# Patient Record
Sex: Male | Born: 1935 | Race: White | Hispanic: No | Marital: Married | Smoking: Former smoker
Health system: Southern US, Community
[De-identification: ages and names within clinical notes are randomized; demographics above are authoritative.]

## PROBLEM LIST (undated history)

## (undated) DIAGNOSIS — R300 Dysuria: Secondary | ICD-10-CM

## (undated) DIAGNOSIS — I10 Essential (primary) hypertension: Secondary | ICD-10-CM

## (undated) DIAGNOSIS — E669 Obesity, unspecified: Secondary | ICD-10-CM

## (undated) DIAGNOSIS — Z87898 Personal history of other specified conditions: Secondary | ICD-10-CM

## (undated) DIAGNOSIS — I779 Disorder of arteries and arterioles, unspecified: Secondary | ICD-10-CM

## (undated) DIAGNOSIS — G4730 Sleep apnea, unspecified: Secondary | ICD-10-CM

## (undated) DIAGNOSIS — I2510 Atherosclerotic heart disease of native coronary artery without angina pectoris: Secondary | ICD-10-CM

## (undated) DIAGNOSIS — I739 Peripheral vascular disease, unspecified: Secondary | ICD-10-CM

## (undated) DIAGNOSIS — I350 Nonrheumatic aortic (valve) stenosis: Secondary | ICD-10-CM

## (undated) DIAGNOSIS — K219 Gastro-esophageal reflux disease without esophagitis: Secondary | ICD-10-CM

## (undated) DIAGNOSIS — E785 Hyperlipidemia, unspecified: Secondary | ICD-10-CM

## (undated) DIAGNOSIS — C801 Malignant (primary) neoplasm, unspecified: Secondary | ICD-10-CM

## (undated) HISTORY — DX: Hyperlipidemia, unspecified: E78.5

## (undated) HISTORY — DX: Obesity, unspecified: E66.9

## (undated) HISTORY — PX: INSERT / REPLACE / REMOVE PACEMAKER: SUR710

## (undated) HISTORY — DX: Peripheral vascular disease, unspecified: I73.9

## (undated) HISTORY — DX: Dysuria: R30.0

## (undated) HISTORY — DX: Disorder of arteries and arterioles, unspecified: I77.9

## (undated) HISTORY — DX: Essential (primary) hypertension: I10

## (undated) HISTORY — DX: Nonrheumatic aortic (valve) stenosis: I35.0

## (undated) HISTORY — DX: Sleep apnea, unspecified: G47.30

---

## 1998-03-20 ENCOUNTER — Ambulatory Visit (HOSPITAL_COMMUNITY): Admission: RE | Admit: 1998-03-20 | Discharge: 1998-03-20 | Payer: Self-pay | Admitting: Internal Medicine

## 2001-08-30 ENCOUNTER — Ambulatory Visit (HOSPITAL_COMMUNITY): Admission: RE | Admit: 2001-08-30 | Discharge: 2001-08-30 | Payer: Self-pay | Admitting: Gastroenterology

## 2001-08-30 ENCOUNTER — Encounter (INDEPENDENT_AMBULATORY_CARE_PROVIDER_SITE_OTHER): Payer: Self-pay | Admitting: Specialist

## 2001-11-17 HISTORY — PX: CORONARY ARTERY BYPASS GRAFT: SHX141

## 2002-01-07 ENCOUNTER — Encounter: Admission: RE | Admit: 2002-01-07 | Discharge: 2002-01-07 | Payer: Self-pay | Admitting: Internal Medicine

## 2002-01-07 ENCOUNTER — Encounter: Payer: Self-pay | Admitting: Internal Medicine

## 2002-04-10 ENCOUNTER — Encounter: Payer: Self-pay | Admitting: Cardiology

## 2002-04-10 ENCOUNTER — Inpatient Hospital Stay (HOSPITAL_COMMUNITY): Admission: EM | Admit: 2002-04-10 | Discharge: 2002-04-11 | Payer: Self-pay

## 2002-04-17 HISTORY — PX: CHOLECYSTECTOMY: SHX55

## 2002-04-27 ENCOUNTER — Encounter: Payer: Self-pay | Admitting: Cardiology

## 2002-04-27 ENCOUNTER — Ambulatory Visit (HOSPITAL_COMMUNITY): Admission: RE | Admit: 2002-04-27 | Discharge: 2002-04-27 | Payer: Self-pay | Admitting: Cardiology

## 2002-05-04 ENCOUNTER — Encounter: Payer: Self-pay | Admitting: Cardiothoracic Surgery

## 2002-05-04 ENCOUNTER — Inpatient Hospital Stay (HOSPITAL_COMMUNITY): Admission: RE | Admit: 2002-05-04 | Discharge: 2002-05-09 | Payer: Self-pay | Admitting: Cardiothoracic Surgery

## 2002-05-05 ENCOUNTER — Encounter: Payer: Self-pay | Admitting: Cardiothoracic Surgery

## 2002-05-06 ENCOUNTER — Encounter: Payer: Self-pay | Admitting: Cardiothoracic Surgery

## 2002-05-07 ENCOUNTER — Encounter: Payer: Self-pay | Admitting: Cardiothoracic Surgery

## 2002-05-25 ENCOUNTER — Encounter (HOSPITAL_COMMUNITY): Admission: RE | Admit: 2002-05-25 | Discharge: 2002-08-23 | Payer: Self-pay | Admitting: Cardiology

## 2002-08-10 ENCOUNTER — Encounter: Payer: Self-pay | Admitting: Surgery

## 2002-08-15 ENCOUNTER — Encounter (INDEPENDENT_AMBULATORY_CARE_PROVIDER_SITE_OTHER): Payer: Self-pay | Admitting: Specialist

## 2002-08-15 ENCOUNTER — Ambulatory Visit (HOSPITAL_COMMUNITY): Admission: RE | Admit: 2002-08-15 | Discharge: 2002-08-16 | Payer: Self-pay | Admitting: Surgery

## 2002-08-15 ENCOUNTER — Encounter: Payer: Self-pay | Admitting: Surgery

## 2002-08-24 ENCOUNTER — Encounter (HOSPITAL_COMMUNITY): Admission: RE | Admit: 2002-08-24 | Discharge: 2002-08-31 | Payer: Self-pay | Admitting: Cardiology

## 2002-09-19 ENCOUNTER — Encounter (HOSPITAL_COMMUNITY): Admission: RE | Admit: 2002-09-19 | Discharge: 2002-12-18 | Payer: Self-pay | Admitting: Cardiology

## 2005-05-22 ENCOUNTER — Ambulatory Visit: Payer: Self-pay | Admitting: Internal Medicine

## 2008-02-16 HISTORY — PX: CAROTID ENDARTERECTOMY: SUR193

## 2008-03-03 ENCOUNTER — Ambulatory Visit: Payer: Self-pay | Admitting: Vascular Surgery

## 2008-03-14 ENCOUNTER — Ambulatory Visit: Payer: Self-pay | Admitting: Vascular Surgery

## 2008-03-14 ENCOUNTER — Inpatient Hospital Stay (HOSPITAL_COMMUNITY): Admission: RE | Admit: 2008-03-14 | Discharge: 2008-03-15 | Payer: Self-pay | Admitting: Vascular Surgery

## 2008-03-14 ENCOUNTER — Encounter: Payer: Self-pay | Admitting: Vascular Surgery

## 2008-03-31 ENCOUNTER — Ambulatory Visit: Payer: Self-pay | Admitting: Vascular Surgery

## 2008-09-29 ENCOUNTER — Ambulatory Visit: Payer: Self-pay | Admitting: Vascular Surgery

## 2009-04-06 ENCOUNTER — Ambulatory Visit: Payer: Self-pay | Admitting: Vascular Surgery

## 2009-04-16 ENCOUNTER — Emergency Department (HOSPITAL_COMMUNITY): Admission: EM | Admit: 2009-04-16 | Discharge: 2009-04-16 | Payer: Self-pay | Admitting: Family Medicine

## 2009-04-17 ENCOUNTER — Encounter: Admission: RE | Admit: 2009-04-17 | Discharge: 2009-04-17 | Payer: Self-pay | Admitting: Otolaryngology

## 2009-04-21 ENCOUNTER — Emergency Department (HOSPITAL_COMMUNITY): Admission: EM | Admit: 2009-04-21 | Discharge: 2009-04-21 | Payer: Self-pay | Admitting: Emergency Medicine

## 2009-06-15 ENCOUNTER — Encounter: Admission: RE | Admit: 2009-06-15 | Discharge: 2009-06-15 | Payer: Self-pay | Admitting: Urology

## 2009-06-19 ENCOUNTER — Ambulatory Visit (HOSPITAL_BASED_OUTPATIENT_CLINIC_OR_DEPARTMENT_OTHER): Admission: RE | Admit: 2009-06-19 | Discharge: 2009-06-19 | Payer: Self-pay | Admitting: Urology

## 2009-08-17 HISTORY — PX: US ECHOCARDIOGRAPHY: HXRAD669

## 2010-04-05 ENCOUNTER — Ambulatory Visit: Payer: Self-pay | Admitting: Vascular Surgery

## 2010-04-30 ENCOUNTER — Ambulatory Visit
Admission: RE | Admit: 2010-04-30 | Discharge: 2010-07-29 | Payer: Self-pay | Source: Home / Self Care | Admitting: Radiation Oncology

## 2010-08-01 ENCOUNTER — Ambulatory Visit
Admission: RE | Admit: 2010-08-01 | Discharge: 2010-08-07 | Payer: Self-pay | Source: Home / Self Care | Admitting: Radiation Oncology

## 2010-09-11 ENCOUNTER — Ambulatory Visit: Payer: Self-pay | Admitting: Cardiology

## 2011-02-22 LAB — POCT I-STAT 4, (NA,K, GLUC, HGB,HCT)
HCT: 45 % (ref 39.0–52.0)
Potassium: 3.9 mEq/L (ref 3.5–5.1)
Sodium: 144 mEq/L (ref 135–145)

## 2011-02-24 LAB — URINE CULTURE

## 2011-02-24 LAB — URINALYSIS, ROUTINE W REFLEX MICROSCOPIC
Bilirubin Urine: NEGATIVE
Ketones, ur: NEGATIVE mg/dL
Specific Gravity, Urine: 1.014 (ref 1.005–1.030)
Urobilinogen, UA: 0.2 mg/dL (ref 0.0–1.0)
pH: 6 (ref 5.0–8.0)

## 2011-02-24 LAB — URINE MICROSCOPIC-ADD ON

## 2011-03-13 ENCOUNTER — Ambulatory Visit: Payer: Self-pay | Admitting: Cardiology

## 2011-03-19 ENCOUNTER — Other Ambulatory Visit: Payer: Self-pay

## 2011-03-20 ENCOUNTER — Other Ambulatory Visit (INDEPENDENT_AMBULATORY_CARE_PROVIDER_SITE_OTHER): Payer: Medicare Other

## 2011-03-20 ENCOUNTER — Ambulatory Visit (INDEPENDENT_AMBULATORY_CARE_PROVIDER_SITE_OTHER): Payer: Medicare Other

## 2011-03-20 DIAGNOSIS — I6529 Occlusion and stenosis of unspecified carotid artery: Secondary | ICD-10-CM

## 2011-03-20 DIAGNOSIS — Z48812 Encounter for surgical aftercare following surgery on the circulatory system: Secondary | ICD-10-CM

## 2011-03-20 NOTE — H&P (Signed)
HISTORY AND PHYSICAL EXAMINATION  Mar 20, 2011  Re:  Andre Miranda, TRETO                  DOB:  1936-06-27  CHIEF COMPLAINT:  Carotid artery disease.  HISTORY OF PRESENT ILLNESS:  This is a 75 year old white male who is a patient of Dr. Bosie Helper who had carotid endarterectomy in April of 2009. He did quite well with his surgery and returns today for follow up carotid duplex scan.  The patient has had no signs or symptoms of stroke.  He denies any hemiparalysis, hemiparesis, amaurosis, or vision changes.  He denies any facial droop or slurred speech.  Recently, back in April of 2011, he was diagnosed with prostate cancer and underwent 8 weeks of radiation, and since this time, he has been having burning with urination as well as frequency.  He has been tested for urinary tract infections and these have been negative.  He has also had a history of urethral dilatation.  He also has a history of CABG in June of 2003 and denies chest pain and only gets shortness of breath with severe exertion.  He denies any history of irregular heartbeat or atrial fibrillation.  He does have heart murmurs and is being followed by Cardiology.  He denies any peptic ulcer disease, melena, or acid reflux. He does use CPAP at night and does this on a regular basis.  He also has a history of hypertension.  Otherwise, his review of systems are negative.  PAST MEDICAL/SURGICAL HISTORY: 1. Extracranial cerebrovascular occlusive disease.  Status post     carotid endarterectomy in April 2009. 2. History of prostate cancer in April 2011.  Eight weeks of     radiation. 3. History of coronary artery disease.  Status post CABG June 2003. 4. History of cholecystectomy in September 2003. 5. History of urethral dilatation. 6. Hypertension. 7. Remote history of tobacco use.  CURRENT MEDICATIONS: 1. Metoprolol 50 mg p.o. b.i.d. 2. Ramipril 10 mg p.o. daily. 3. Norvasc 2.5 mg p.o. daily. 4.  Hydrochlorothiazide 25 mg p.o. daily. 5. Allopurinol 300 mg p.o. daily. 6. Pepcid 20 mg p.o. daily. 7. Multivitamin p.o. daily. 8. D3 vitamins p.o. daily. 9. Aspirin 325 mg 1 p.o. daily. 10.Crestor 20 mg 1/2 tablet p.o. nightly. 11.Fish oil 1000 mg 2 tablets t.i.d. 12.Flomax 0.4 mg p.o. daily. 13.phenazopyridine 200 mg t.i.d.  SOCIAL HISTORY:  He is married.  He has remote tobacco use quitting in 1970 and he does drink 2 to 3 glasses of wine per week.  ALLERGIES:  No known drug allergies.  REVIEW OF SYSTEMS:  The review of systems were reviewed and are negative other than what is in the HPI, or was paused there because I keep trying to do it.  PHYSICAL EXAMINATION:  Vital Signs:  Heart rate is 60, blood pressure is 148/81 with mean of 96, oxygen saturation is 95% on room air.  General: He is in no acute distress.  HEENT:  Pupils equal, round, and reactive to light.  Conjunctivae normal.  Lungs:  Clear to auscultation bilaterally.  No rhonchi, rales, or wheezing.  Cardiovascular:  No carotid bruits are heard.  Heart is regular rate and rhythm with a positive murmur.  He does have 2+ radial pulses as well as 2+ dorsalis pedis pulses.  Abdomen:  Soft, nontender, nondistended.  Positive bowel sounds.  Neuro:  There are no focal defects.  Skin:  Without ulcers or rashes.  He does have a well-healed  CABG scar as well as well-healed vein harvest scars.  ASSESSMENT:  This is a 75 year old white male who has a history of extracranial cerebrovascular occlusive disease with a history of carotid endarterectomy in April of 2009 as well as hypertension, prostate cancer with 8 weeks of radiation, history of coronary artery disease.  PLAN: 1. The patient is to return in 1 year to have a carotid duplex scan. 2. He does have an appointment in a couple weeks with Dr. Deborah Chalk and     he will be following up with Dr. Waynard Edwards as well as Dr. Vonita Moss for     his other medical conditions.  His  carotid duplex scan today did reveal patent right carotid endarterectomy site with no evidence of restenosis.  There is no hemodynamically significant increase in Doppler velocities of the left internal carotid artery noted.  These are stable from his previous study.  Newton Pigg, PA  Charles E. Fields, MD Electronically Signed  SE/MEDQ  D:  03/20/2011  T:  03/20/2011  Job:  [id number]

## 2011-03-27 NOTE — Procedures (Unsigned)
CAROTID DUPLEX EXAM  INDICATION:  Follow up carotid artery disease.  HISTORY: Diabetes:  No. Cardiac:  CABG. Hypertension:  Yes. Smoking:  Previous. Previous Surgery:  Right carotid endarterectomy by Dr. Arbie Cookey, April 2009. CV History:  Currently asymptomatic. Amaurosis Fugax No, Paresthesias No, Hemiparesis No.                                      RIGHT             LEFT Brachial systolic pressure:         138               140 Brachial Doppler waveforms:         Normal            Normal Vertebral direction of flow:        Antegrade         Antegrade DUPLEX VELOCITIES (cm/sec) CCA peak systolic                   61                75 ECA peak systolic                   159               131 ICA peak systolic                   93                64 ICA end diastolic                   22                22 PLAQUE MORPHOLOGY:                                    Heterogenous PLAQUE AMOUNT:                      None              Mild PLAQUE LOCATION:                                      ICA  IMPRESSION: 1. Patent right carotid endarterectomy site with no evidence of     restenosis of the internal carotid artery. 2. No hemodynamically significant increase in Doppler velocities of     the left internal carotid artery noted. 3. Stable from previous study.  ___________________________________________ Larina Earthly, M.D.  EM/MEDQ  D:  03/20/2011  T:  03/21/2011  Job:  [id number]

## 2011-04-01 NOTE — Discharge Summary (Signed)
NAME:  Andre Miranda, RIGGI NO.:  [id number]   MEDICAL RECORD NO.:  [id number]          PATIENT TYPE:  INP   LOCATION:  3313                         FACILITY:  [facility]   PHYSICIAN:  Larina Earthly, M.D.    DATE OF BIRTH:  03-Jul-1936   DATE OF ADMISSION:  03/14/2008  DATE OF DISCHARGE:  03/15/2008                               DISCHARGE SUMMARY   The patient of Dr. Arbie Cookey.   DISCHARGE DIAGNOSES:  1. Right carotid occlusive disease.  2. Hypertension.  3. Dyslipidemia.   PROCEDURE PERFORMED:  Right carotid endarterectomy, March 14, 2008, Dr.  Arbie Cookey.   COMPLICATIONS:  None.   DISCHARGE MEDICATIONS:  1. Metoprolol 50 mg p.o. b.i.d.  2. Ramipril 10 mg p.o. q.a.m.  3. Hydrochlorothiazide 12.5 mg p.o. q.a.m.  4. Crestor 5 mg p.o. q.p.m.  5. Allopurinol 300 mg p.o.  6. Pepcid 20 mg p.o. q.a.m.  7. Ecotrin 325 mg p.o. q.a.m.  8. Centrum Silver one p.o. daily.  9. Fish oil 100 mg two p.o. t.i.d.  10.Vitamin D-3 p.o. q. noon.  11.Colchicine 0.6 mg as needed.  12.Prednisone 20 mg as needed.  13.Percocet 5/325 one p.o. q.4 h. p.r.n. pain total #20 given.   DISPOSITION:  He is being discharged home in stable condition with his  wounds healing well.  He was given careful instructions regarding the  care of his wound.  He is to clean the wound with soap and warm water.  He is to observe the wound for drainage, increasing pain, swelling,  redness, fever greater than 101.2, or neurological changes.  He is to  see Dr. Arbie Cookey in 2 weeks.  The office will call for visit to schedule  the appointment.   BRIEF IDENTIFYING STATEMENT:  For complete details, please refer to  typed history and physical.  Briefly, this very pleasant 75 year old  gentleman was referred to Dr. Arbie Cookey for evaluation of severe  asymptomatic right internal carotid arterial stenosis.  Dr. Arbie Cookey found  him to be suitable candidate for carotid endarterectomy.  He was  informed of the risks and benefits of the  procedure, and after careful  consideration, elected to proceed with surgery.   HOSPITAL COURSE:  Preoperative workup was completed as an outpatient.  He was brought in through the same-day surgery and underwent the  aforementioned right carotid endarterectomy.  For complete details,  please refer the typed operative report.  The procedure was without  complications.  He was returned to the Postanesthesia Care Unit  extubated.  Following stabilization, he was transferred to a bed in a  surgical step-down unit.  He was observed  overnight.  Following morning, he was doing well from a surgical  standpoint; however, he was complaining of some chest discomfort.  Due  to his history of coronary artery disease, we elected to ask Dr.  Deborah Chalk, his cardiologist, to evaluate him.  With the concurrence of Dr.  Deborah Chalk, he will be discharged home today, March 15, 2008.      Wilmon Arms, PA      Larina Earthly, M.D.  Electronically Signed    KEL/MEDQ  D:  03/15/2008  T:  03/15/2008  Job:  [id number]   cc:   Loraine Leriche A. Perini, M.D.

## 2011-04-01 NOTE — Op Note (Signed)
NAME:  Andre Miranda, Andre Miranda NO.:  [id number]   MEDICAL RECORD NO.:  [id number]          PATIENT TYPE:  AMB   LOCATION:  [facility]                         FACILITY:  [facility]   PHYSICIAN:  Maretta Bees. Vonita Moss, M.D.DATE OF BIRTH:  15-Feb-1936   DATE OF PROCEDURE:  06/19/2009  DATE OF DISCHARGE:                               OPERATIVE REPORT   PREOPERATIVE DIAGNOSIS:  Hematuria and urethral stricture.   POSTOPERATIVE DIAGNOSIS:  Hematuria and urethral stricture.   PROCEDURE:  Cystoscopy, balloon dilation of urethral stricture and  bilateral retrograde pyelograms with interpretation.   SURGEON:  Dr. Larey Dresser.   ANESTHESIA:  General.   INDICATIONS:  This 75 year old gentleman had gross hematuria in June,  and a CT scan in the emergency room showed no obvious urinary tract  lesions including stones.  It was a noncontrasted CT scan.  I saw him in  the office, and I could not cystoscope the bladder because of a urethral  stricture.  He was brought to the OR today for further evaluation  including retrograde pyelograms to delineate the upper urinary tracts in  view of his noncontrasted CT scan.   PROCEDURE:  The patient was brought to the operating room and placed in  lithotomy position.  The external genitalia were prepped and draped in  usual fashion.  He was cystoscoped, and the anterior urethra was normal  except for a tight stricture in the bulbous urethra.  I then inserted a  sensor guidewire into the bladder, and over the sensor guidewire, I  performed a balloon dilation to 24-French at 12 atmospheres of pressure  for 3 minutes.  I then cystoscoped him and easily traversed this  strictured area.  The prostate had partial obstruction.  The bladder had  mild trabeculation but no stones, tumors or inflammatory lesions of any  sort.   Bilateral retrograde pyelograms were performed by using the cystoscope  to insert a cone-tipped ureteral catheter and performed  retrograde  pyelography.  The ureters had some mild J hooking.  There were a couple  bubbles in the  contrast, but I followed them and knew that they were not filling  defects.  The pyelocaliceal systems were nonobstructed and without  filling defects.  At this point, the bladder was emptied, scope removed,  and the patient taken to the recovery room in good condition.      Maretta Bees. Vonita Moss, M.D.  Electronically Signed     LJP/MEDQ  D:  06/19/2009  T:  06/19/2009  Job:  [id number]   cc:   Loraine Leriche A. Perini, M.D.  Fax: [phone]

## 2011-04-01 NOTE — Assessment & Plan Note (Signed)
OFFICE VISIT   Andre Miranda, Andre Miranda  DOB:  27-Nov-1935                                       09/29/2008  ZOXWR#:[id number]   The patient presents today for a 46-month follow up of his right carotid  endarterectomy for severe asymptomatic internal carotid artery stenosis.  He has done well since his surgery and he has had no neurologic deficits  and no postoperative complications.  He remains stable with no new  cardiac difficulties.  He does continue to have hypertensive issues,  which are controlled with medication.   PHYSICAL EXAM:  Well-developed, well-nourished white male appearing  stated age of 59.  Blood pressure is 156/79, pulse 61, respirations 18.  His right leg incision is well healed.  He does not have bruits on the  right or left neck.  His radial pulses are 2+ and he is grossly intact  neurologically.   He underwent repeat carotid duplex in our office today and this reveals  wide patency of his right endarterectomy with no stenosis, and no  significant stenosis in his left carotid system.  I discussed at length  with the patient.  He will continue his usual activity.  He will notify  us should he develop any neurologic deficits.  Otherwise, we will see  him again in our vascular lab protocol for ongoing followup.   Larina Earthly, M.D.  Electronically Signed   TFE/MEDQ  D:  09/29/2008  T:  10/02/2008  Job:  [id number]   cc:   Loraine Leriche A. Perini, M.D.  Colleen Can. Deborah Chalk, M.D.

## 2011-04-01 NOTE — Consult Note (Signed)
NEW PATIENT CONSULTATION   Andre Miranda, Andre Miranda  DOB:  [birth year]                                       03/03/2008  GNFAO#:[id number]   The patient presents today for evaluation of severe asymptomatic right  internal carotid artery stenosis.  He is a 75 year old gentleman who is  quite active with history of hypertension, elevated cholesterol, and  coronary artery disease.  He is found to have a history of a screening  study in the past showing some moderate carotid disease and we are  seeing him today for further evaluation of this.  He denies specifically  any prior transient ischemic attack, amaurosis fugax, stroke.  Did  undergo coronary bypass grafting in 2003 and also a cholecystectomy  2003.  He does require see back for pulmonary issues.  His family  history is negative for premature carotid disease.   SOCIAL HISTORY:  Is married, 2 children.  He is retired.  He does not  smoke and quit 40 years ago, and does have social alcohol consumption.   REVIEW OF SYSTEMS:  His weight is stable at 240 pounds, height 5 feet  11.  He does have a prior history of cardiac disease as stated.   ALLERGIES:  No known drug allergies.   MEDICATIONS:  Metoprolol 50 mg b.i.d., ramipril 10 mg daily,  hydrochlorothiazide 25 mg 1/2 tablet daily, Crestor 10 mg 1/2 tablet  daily, allopurinol 10 mg daily, Pepcid AC 20 mg daily, Ecotrin 325 mg  daily, Centrum Silver 1 daily, fish oil 1000 mg 3 gel tabs twice a day,  vitamin D, occasional medication as needed for gout, colchicine and  prednisone.   PHYSICAL EXAM:  Well-developed and moderately obese white male in no  acute distress.  He is grossly intact neurologically.  I do not  appreciate carotid bruits bilaterally.  He has 2+ radial and 2+  posterior tibial pulses bilaterally.  Heart is regular rate rhythm with  a soft murmur.  He does have clear breath sounds bilaterally.  Abdominal  exam reveals no obesity.  No masses, no  tenderness.   VASCULAR LABORATORY STUDY:  Did undergo noninvasive vascular laboratory  studies today, revealing severe greater than 80% stenosis in the right  carotid system and no significant left carotid stenosis.  He did have  antegrade vertebral flow bilaterally.  I discussed this at length with  the patient.  I explained that this does put him at increased risk for  stroke related to severe asymptomatic stenosis.  I have recommended that  he undergo endarterectomy for maximum stroke risk.  I explained the  procedure to include the risk of stroke with surgery.  He understands  and wishes to proceed.  He has a trip planned next month and I feel  that, as long as he remains asymptomatic, that it is acceptable risk to  wait until he returns from this.  He will notify us when he wishes to  proceed with elective right carotid endarterectomy.   Larina Earthly, M.D.  Electronically Signed   TFE/MEDQ  D:  03/03/2008  T:  03/06/2008  Job:  [id number]   cc:   Loraine Leriche A. Perini, M.D.  Colleen Can. Deborah Chalk, M.D.

## 2011-04-01 NOTE — Consult Note (Signed)
NAME:  Andre Miranda, Andre Miranda NO.:  [id number]   MEDICAL RECORD NO.:  [id number]          PATIENT TYPE:  INP   LOCATION:  3313                         FACILITY:  [facility]   PHYSICIAN:  Colleen Can. Deborah Chalk, M.D.DATE OF BIRTH:  [birth year]   DATE OF CONSULTATION:  03/15/2008  DATE OF DISCHARGE:  03/15/2008                                 CONSULTATION   Thank you much for asking [place] to see Andre Miranda.  He is a 75 year old  male with a history of coronary disease and prior coronary artery bypass  grafting in 2003.  He had chest pain following a right carotid  endarterectomy.  It was a pressure-like fullness, but seemingly it was  worse with a deep breath.  One time it was described as an 8/10 chest  pain.  It was treated with Tylox with partial relief.  Symptoms lasted a  total of about 8 hours.  We subsequently had done an EKG and cardiac  enzymes which have been negative.  He currently is better.   His symptoms when he had coronary artery bypass grafting were also  associated with symptomatic cholelithiasis at that time.  He had chest  pressure and belching and diaphoresis as his primary symptoms.   PAST MEDICAL HISTORY:  He had coronary artery bypass grafting x4 in 2003  with a LIMA to the LAD, LIMA to the right coronary artery, and a  saphenous vein graft to the intermediate and distal circumflex.  He has  had a history of hypertension, obesity, status post cholecystectomy,  known carotid disease, hypercholesterolemia, and remote tobacco use.  He  has had sleep apnea and gout.   ALLERGIES:  None.   CURRENT MEDICATIONS:  1. Allopurinol 300 mg a day.  2. Aspirin 325 mg a day.  3. Vitamin E.  4. Pepcid 20 mg a day.  5. Hydrochlorothiazide 12.5 mg day.  6. Lopressor 50 mg b.i.d.  7. Multivitamin.  8. Lovaza 1 gram t.i.d.  9. Altace 10 mg per day.  10.Crestor 5 mg at bedtime.   FAMILY HISTORY:  Father died at age 27 with coronary artery disease and  prior myocardial  infarction, one sister has had 2 stents, and mother  lived into her 79s.   SOCIAL HISTORY:  He is married, remote smoker, and rare alcohol use.   REVIEW OF SYSTEMS:  He has some belching at times.  Overall, it seems  like he is doing well.  He does exercise regularly but has dietary  indiscretion.  Lipids have been followed by Dr. Waynard Edwards.  His weight is  in the 235-240 range.   PHYSICAL EXAMINATION:  GENERAL:  He is well-developed and moderately  obese.  VITAL SIGNS:  Heart rate 58, blood pressure is 135/60.  SKIN:  Warm and dry.  Color is normal.  LUNGS:  Clear.  HEART:  Shows 2/6 systolic ejection murmur.  ABDOMEN:  Soft, nontender.  EXTREMITIES:  Without edema.   PERTINENT LABORATORY DATA:  Hematocrit is 38.  Chemistries are negative.  Potassium is 3.3.  Cardiac enzymes and troponins are all negative.   IMPRESSION:  1. Postoperative chest  pain, probably not coronary in nature.  2. Known coronary atherosclerosis with prior bypass surgery in 2003.  3. Status post right carotid endarterectomy with progressive carotid      disease over the last 3-5 years.  4. Hypertension.  5. Hypercholesterolemia.  6. Obesity.  7. Aortic outflow murmur.   PLAN:  We will arrange for him to be discharged.  He will need to have a  followup 2D echocardiogram because of the outflow murmur in the late  peaking features associated with it.  I think we need to address the  progression of his atherosclerotic disease in the carotid location and  treat tentatively as a systemic issue and try to have more aggressively  modified cardiovascular risk factors.      Colleen Can. Deborah Chalk, M.D.  Electronically Signed     SNT/MEDQ  D:  03/15/2008  T:  03/16/2008  Job:  [id number]   cc:   Loraine Leriche A. Perini, M.D.  Larina Earthly, M.D.

## 2011-04-01 NOTE — Procedures (Signed)
CAROTID DUPLEX EXAM   INDICATION:  Follow up right carotid endarterectomy.   HISTORY:  Diabetes:  No.  Cardiac:  CABG.  Hypertension:  Yes.  Smoking:  Previous.  Previous Surgery:  Right carotid endarterectomy by Dr. Arbie Cookey in April  2009.  CV History:  Currently asymptomatic.  Amaurosis Fugax No, Paresthesias No, Hemiparesis No.                                       RIGHT             LEFT  Brachial systolic pressure:         140               142  Brachial Doppler waveforms:         Normal            Normal  Vertebral direction of flow:        Antegrade         Antegrade  DUPLEX VELOCITIES (cm/sec)  CCA peak systolic                   77                59  ECA peak systolic                   144               122  ICA peak systolic                   88                68  ICA end diastolic                   27                21  PLAQUE MORPHOLOGY:                                    Heterogenous  PLAQUE AMOUNT:                      None              Mild  PLAQUE LOCATION:                                      ICA   IMPRESSION:  1. Patent right carotid endarterectomy site with no right internal      carotid artery stenosis.  2. No hemodynamically significant increase in the Doppler velocities      of the left internal carotid artery noted.  3. No significant change noted when compared to the previous      examination on 04/06/2009.   ___________________________________________  Larina Earthly, M.D.   CH/MEDQ  D:  04/05/2010  T:  04/05/2010  Job:  [id number]

## 2011-04-01 NOTE — Procedures (Signed)
CAROTID DUPLEX EXAM   INDICATION:  Followup right carotid endarterectomy.   HISTORY:  Diabetes:  No.  Cardiac:  CABG in 2003.  Hypertension:  Yes.  Smoking:  Quit about 30 years ago.  Previous Surgery:  No.  CV History:  No.  Amaurosis Fugax No, Paresthesias No, Hemiparesis No                                       RIGHT             LEFT  Brachial systolic pressure:         156               162  Brachial Doppler waveforms:         Biphasic          Biphasic  Vertebral direction of flow:        Antegrade         Antegrade  DUPLEX VELOCITIES (cm/sec)  CCA peak systolic                   95                86  ECA peak systolic                   192               130  ICA peak systolic                   92                80  ICA end diastolic                   26                31  PLAQUE MORPHOLOGY:                  None              Heterogenous  PLAQUE AMOUNT:                      None              Mild  PLAQUE LOCATION:                    ECA               ICA and ECA   IMPRESSION:  1-39% stenosis noted in the left ICA.  Normal carotid  duplex noted in the right ICA.  Status post right carotid  endarterectomy.  Antegrade bilateral vertebral arteries.   ___________________________________________  Larina Earthly, M.D.   MG/MEDQ  D:  09/29/2008  T:  09/29/2008  Job:  [id number]

## 2011-04-01 NOTE — Op Note (Signed)
NAME:  Andre Miranda, [place] NO.:  [id number]   MEDICAL RECORD NO.:  [id number]          PATIENT TYPE:  INP   LOCATION:  3313                         FACILITY:  [facility]   PHYSICIAN:  Larina Earthly, M.D.    DATE OF BIRTH:  26-Jul-1936   DATE OF PROCEDURE:  DATE OF DISCHARGE:                               OPERATIVE REPORT   PREOPERATIVE DIAGNOSIS:  Severe asymptomatic internal carotid artery  stenosis.   POSTOPERATIVE DIAGNOSIS:  Severe asymptomatic internal carotid artery  stenosis.   PROCEDURE:  Right carotid endarterectomy and Dacron patch angioplasty.   SURGEON:  Larina Earthly, MD   ASSISTANT:  Wilmon Arms, PA-C.   ANESTHESIA:  General endotracheal, Maren Beach, MD   COMPLICATIONS:  None.   DISPOSITION:  To recovery room, stable.   PROCEDURE IN DETAIL:  The patient was taken to the operating position  and placed in supine position where the right neck was prepped and  draped in sterile fashion.  The patient had a relatively high  bifurcation.  Incision was made anterior to sternocleidomastoid below  the level of the ear.  The sternocleidomastoid reflected posteriorly and  the carotid sheath was opened.  The facial vein was ligated with 2-0  silk ties and divided.  The vagus and hypoglossal nerves were identified  and preserved.  The common carotid artery was circled with umbilical  tape and Rumel tourniquet.  Dissection was carried onto the bifurcation.  The superior thyroid artery was circled with 2-0 silk Potts tie.  The  external carotid circled with vessel loop.  The internal carotid circled  with umbilical tape and Rumel tourniquet.  The patient was given 10,000  units of intravenous heparin.  After adequate circulation time, the  internal and external common carotid arteries were occluded.  The common  carotid artery was opened with an 11 blade, and tenotomy Potts scissors  through the plaque went into the internal carotid with Potts scissors.  A 10 shunt was passed up the internal carotid artery, back-bleed, and  then the common carotid artery was secured with Rumel tourniquets.  Endarterectomy was begun in the common carotid artery and the plaques  were divided proximally with the Potts scissors.  The endarterectomy  extended onto bifurcation and the external carotid endarterectomized  with the eversion technique and the internal carotid endarterectomized  in open fashion.  Remaining atheromatous debris was removed from  endarterectomy plane.  Finesse Hemashield Dacron patch was brought onto  the field and was sewn as a patch angioplasty with a running 6-0 Prolene  suture.  Prior to completion anastomosis, the shunt was removed and the  usual flush maneuvers undertaken.  Anastomosis was completed.  Flow  restored first to the external then the internal carotid artery.  Excellent flow characteristics were noted with handheld Doppler in the  internal and external carotid arteries.  The patient was given 50 mg of  protamine to reverse the heparin.  The wounds were irrigated with  saline.  Hemostasis with electrocautery.  The wounds were closed with  several 3-0 Vicryl sutures reapproximated  sternocleidomastoid over the carotid sheath.  Next, the platysma was  closed a running 3-0 Vicryl sutures.  Finally, the skin was closed with  4-0 subcuticular Vicryl stitch.  Sterile dressing was applied.  The  patient was taken to recovery room in stable condition.      Larina Earthly, M.D.  Electronically Signed     TFE/MEDQ  D:  03/14/2008  T:  03/15/2008  Job:  [id number]   cc:   Loraine Leriche A. Perini, M.D.  Colleen Can. Deborah Chalk, M.D.  Clinton D. Maple Hudson, MD, FCCP, FACP

## 2011-04-01 NOTE — Procedures (Signed)
CAROTID DUPLEX EXAM   INDICATION:  Followup known carotid artery disease.   HISTORY:  Diabetes:  No.  Cardiac:  CABG in 2003.  Hypertension:  Yes.  Smoking:  Quit 30 years ago.  Previous Surgery:  No.  CV History:  No.  Amaurosis Fugax No, Paresthesias No, Hemiparesis No                                       RIGHT             LEFT  Brachial systolic pressure:         128               120  Brachial Doppler waveforms:         Biphasic          Biphasic  Vertebral direction of flow:        Antegrade         Antegrade  DUPLEX VELOCITIES (cm/sec)  CCA peak systolic                   111               106  ECA peak systolic                   156               114  ICA peak systolic                   343               74  ICA end diastolic                   121               24  PLAQUE MORPHOLOGY:                  Heterogenous      Heterogenous  PLAQUE AMOUNT:                      Severe            Mild  PLAQUE LOCATION:                    ICA and ECA       ICA and ECA   IMPRESSION:  1. 80-99% stenosis noted in the right ICA.  2. 1-39% stenosis noted in the left ICA.  3. Antegrade bilateral vertebral arteries.   ___________________________________________  Quita Skye Hart Rochester, M.D.   MG/MEDQ  D:  03/03/2008  T:  03/03/2008  Job:  [id number]

## 2011-04-01 NOTE — Assessment & Plan Note (Signed)
OFFICE VISIT   KIRT, CHEW  DOB:  [birth year]                                       03/31/2008  ZOXWR#:[id number]   The patient is seen in our office today for followup of his right  carotid endarterectomy on 03/14/2008 for severe asymptomatic disease.  He did well in the hospital and was discharged home on postoperative day  1.  He has had no neurologic deficits and has had no wound healing  problems.  He has a well healed incision with no bruits bilaterally.  He  will continue his usual activities without limitation.  I plan to see  him again in 6 months with repeat carotid duplex at that time.  He is  taking a trip to [place] in the next week or two and has no restrictions  for this as well.   Larina Earthly, M.D.  Electronically Signed   TFE/MEDQ  D:  03/31/2008  T:  04/03/2008  Job:  [id number]   cc:   Loraine Leriche A. Perini, M.D.

## 2011-04-01 NOTE — Procedures (Signed)
CAROTID DUPLEX EXAM   INDICATION:  Followup evaluation of known carotid artery disease.   HISTORY:  Diabetes:  No.  Cardiac:  Coronary artery bypass graft in 2003.  Hypertension:  Yes.  Smoking:  Former smoker.  Previous Surgery:  Right carotid endarterectomy with Dacron patch  angioplasty by Dr. Arbie Cookey in April of 2009.  CV History:  The patient reports no cerebrovascular symptoms at this  time.  Previous duplex performed 09/29/2008 revealed no right ICA  stenosis, status post endarterectomy and 20-39% left ICA stenosis.  Amaurosis Fugax No, Paresthesias No, Hemiparesis No                                       RIGHT             LEFT  Brachial systolic pressure:         128               126  Brachial Doppler waveforms:         Triphasic         Triphasic  Vertebral direction of flow:        Antegrade         Antegrade  DUPLEX VELOCITIES (cm/sec)  CCA peak systolic                   109               84  ECA peak systolic                   155               115  ICA peak systolic                   67                63  ICA end diastolic                   26                25  PLAQUE MORPHOLOGY:                  None              Soft  PLAQUE AMOUNT:                      None              Mild  PLAQUE LOCATION:                    None              Proximal ICA, ECA   IMPRESSION:  1. No right ICA stenosis status post endarterectomy.  2. 20-39% left ICA stenosis.  3. No significant change from previous study.   ___________________________________________  Larina Earthly, M.D.   MC/MEDQ  D:  04/06/2009  T:  04/06/2009  Job:  [id number]

## 2011-04-03 ENCOUNTER — Encounter: Payer: Self-pay | Admitting: Cardiology

## 2011-04-03 DIAGNOSIS — R300 Dysuria: Secondary | ICD-10-CM | POA: Insufficient documentation

## 2011-04-03 DIAGNOSIS — I779 Disorder of arteries and arterioles, unspecified: Secondary | ICD-10-CM | POA: Insufficient documentation

## 2011-04-03 DIAGNOSIS — R319 Hematuria, unspecified: Secondary | ICD-10-CM | POA: Insufficient documentation

## 2011-04-03 DIAGNOSIS — E669 Obesity, unspecified: Secondary | ICD-10-CM | POA: Insufficient documentation

## 2011-04-03 DIAGNOSIS — G4733 Obstructive sleep apnea (adult) (pediatric): Secondary | ICD-10-CM | POA: Insufficient documentation

## 2011-04-03 DIAGNOSIS — I350 Nonrheumatic aortic (valve) stenosis: Secondary | ICD-10-CM | POA: Insufficient documentation

## 2011-04-03 DIAGNOSIS — I10 Essential (primary) hypertension: Secondary | ICD-10-CM | POA: Insufficient documentation

## 2011-04-03 DIAGNOSIS — M109 Gout, unspecified: Secondary | ICD-10-CM | POA: Insufficient documentation

## 2011-04-04 ENCOUNTER — Ambulatory Visit (INDEPENDENT_AMBULATORY_CARE_PROVIDER_SITE_OTHER): Payer: Medicare Other | Admitting: Cardiology

## 2011-04-04 ENCOUNTER — Encounter: Payer: Self-pay | Admitting: Cardiology

## 2011-04-04 VITALS — BP 136/80 | HR 56 | Ht 70.0 in | Wt 254.2 lb

## 2011-04-04 DIAGNOSIS — I350 Nonrheumatic aortic (valve) stenosis: Secondary | ICD-10-CM

## 2011-04-04 DIAGNOSIS — I359 Nonrheumatic aortic valve disorder, unspecified: Secondary | ICD-10-CM

## 2011-04-04 DIAGNOSIS — I2510 Atherosclerotic heart disease of native coronary artery without angina pectoris: Secondary | ICD-10-CM | POA: Insufficient documentation

## 2011-04-04 NOTE — H&P (Signed)
[place]  Patient:    Andre Miranda, Andre Miranda Visit Number: [id number] MRN: [id number]          Service Type: MED Location: 2000 2011 01 Attending Physician:  Eleanora Neighbor Dictated by:   Colleen Can. Deborah Chalk, M.D. Admit Date:  04/10/2002   CC:         Rodrigo Ran, M.D.   History and Physical  HISTORY OF PRESENT ILLNESS: Andre Miranda is a 75 year old male, who was basically feeling well until 11 p.m. last night.  At 11 p.m. he developed an epigastric to low substernal constant discomfort with belching and was clammy. He had his blood pressure taken by his wife and it was 190/80.  He had been working on a shed with his son and actually had injured his head slightly in an insignificant way, but basically had felt good through the day.  He had eaten fried chicken at approximately 5 p.m.  He ate nuts at about 10 p.m. and basically was feeling fine at that time before the onset of this discomfort. This has been a steady discomfort with no real shortness of breath.  This was associated with nausea and some retching.  He had nitroglycerin with very vague help.  He has a history of hypertension, a history of gallstones, a history of elevated triglycerides, long-term obesity, and a positive family history of heart disease.  SOCIAL HISTORY: He is 37.  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  Nonsmoker.  Stopped smoking 33 years ago.  Rare alcohol.  Has been married 36 years.  ALLERGIES: None.  PAST MEDICAL HISTORY: Other medical problems include:  1. History of diverticulitis.  2. History of sleep apnea.  3. History of elevated triglyceride.  4. Hypertension in the last five to six years.  5. History of long-standing obesity.  CURRENT MEDICATIONS:  1. Hyzaar.  2. Metoprolol (questionable doses).  He takes metoprolol b.i.d.  3. Ecotrin 325 mg q.d.  4. Centrum Silver one q.d.  FAMILY HISTORY: Father died at age 71 of MI but  had MI previously.  His mother is alive at age 79 and well.  A sister has had two stents.  She has a history of diverticulitis.  REVIEW OF SYSTEMS: He has a history of sleep apnea and has been on CPAP.  He has decreased hearing in his right ear.  PULMONARY: Negative.  CARDIAC: Negative.  In particular, he has had no history of murmur.  GI: He still has his gallbladder in place.  He has had a history of gallstones.  Six to seven years ago he was admitted with some pain and had a negative stress thallium study.  He has a history of an abdominal ultrasound which showed stones and question enlarged liver in February 2003 on physical examination by Dr. Waynard Edwards.  GU: Basically negative.  MUSCULOSKELETAL: Negative.  NEUROLOGIC: Intact.  ENDOCRINE: Okay.  He was hit earlier today in the forehead while building.  PHYSICAL EXAMINATION:  GENERAL: He is a pleasant 75 year old white male.  He is very polite.  He is somewhat sedated but easily aroused.  He is alert.  He is pale.  VITAL SIGNS: Blood pressure is 150/80, heart rate 60, respiratory rate unlabored.  HEENT: There is a small linear cut on his forehead that is not bleeding. PERRL, somewhat constricted.  Palate somewhat arched.  NECK: Full.  There is a murmur radiating from the chest.  HEART: Systolic ejection murmur 2/6.  S1 and S2  normal.  ABDOMEN: Obese.  Liver edge palpable.  Normal bowel sounds.  No masses. Nontender.  There is no rebound.  CHEST: Lungs are clear.  RECTAL: Increased prostate.  Stool negative.  No masses.  EXTREMITIES: Full.  There is no pitting.  NEUROLOGIC: Intact.  LABORATORY DATA: WBC 6900, hematocrit 42.  Chemistries are negative.  Glucose is 126, BUN 28, creatinine 1.1, total bilirubin 1.2, alkaline phosphatase 82. Troponin and CK are all normal.  EKG shows no acute changes, essentially normal.  Chest x-ray shows question of some hilar fullness on the right.  OVERALL IMPRESSION:  1. Deep visceral  chest pain, epigastric to low substernal.  2. Hypertension.  3. History of gallstones.  4. Mild renal insufficiency.  5. History of sleep apnea.  PLAN: With this ongoing chest pain we need to try to rule out cardiac problems reasonably quickly.  We will get a CT scan of his abdomen and low chest and try to evaluate for clear-cut other cause of the problem.  If we could possibly do it without contrast it would be ideal.  If we have to we can consider that but would move on to cardiac catheterization quickly if needed. Dictated by:   Colleen Can Deborah Chalk, M.D. Attending Physician:  Eleanora Neighbor DD:  04/10/02 TD:  04/11/02 Job: [id number]

## 2011-04-04 NOTE — Assessment & Plan Note (Signed)
He a coronary artery bypass graft in 2003 has not had recurrent symptoms. His last stress Cardiolite study was in June of 2011. His ejection fraction was 56% at that time and he had normal perfusion.

## 2011-04-04 NOTE — Progress Notes (Signed)
Subjective:   [name] is seen today for followup visit he had external radiation for his prostate cancer in August of 2011 and still has considerable dysuria and frequency.  He a coronary artery bypass grafting in 2003 without recurrent symptoms but has developed moderate aortic stenosis. He a previous carotid endarterectomy in April 2009. He notes that he can hear his heartbeat on occasion. He has yearly followup of his carotid disease with Dr. Edilia Bo.  His bypass surgery in 2003 included a LIMA to the LAD, RIMA to the right coronary artery, and a saphenous vein graft to the intermediate and distal circumflex. His last echocardiogram showed an aortic valve area of 1.0 cm with a mean gradient of 24 mm peak gradient of 41 mm mercury. He remains asymptomatic.  He had a cholecystectomy in June of 2003 and a right carotid endarterectomy in April of 2009.  Current Outpatient Prescriptions  Medication Sig Dispense Refill  . allopurinol (ZYLOPRIM) 300 MG tablet Take 300 mg by mouth daily.        Marland Kitchen amLODipine (NORVASC) 2.5 MG tablet Take 2.5 mg by mouth daily.        Marland Kitchen aspirin 325 MG EC tablet Take 325 mg by mouth daily.        . Cholecalciferol (VITAMIN D3) 1000 UNITS CAPS Take by mouth daily.        . colchicine 0.6 MG tablet Take 0.6 mg by mouth as needed.        Tery Sanfilippo Calcium (STOOL SOFTENER PO) Take by mouth as needed.        . famotidine (PEPCID) 20 MG tablet Take 20 mg by mouth daily.        . hydrochlorothiazide 25 MG tablet Take 25 mg by mouth daily.        . metoprolol (LOPRESSOR) 50 MG tablet Take 50 mg by mouth 2 (two) times daily.        . Multiple Vitamins-Minerals (CENTRUM SILVER) tablet Take 1 tablet by mouth daily.        . Omega-3 Fatty Acids (FISH OIL) 1000 MG CAPS Take by mouth 3 (three) times daily. 2 TABLETS TID       . predniSONE (DELTASONE) 20 MG tablet Take 20 mg by mouth as needed.        . ramipril (ALTACE) 10 MG tablet Take 10 mg by mouth daily.        .  rosuvastatin (CRESTOR) 10 MG tablet Take 10 mg by mouth daily.        . Tamsulosin HCl (FLOMAX) 0.4 MG CAPS Take 0.4 mg by mouth daily.          Allergies  Allergen Reactions  . Ibuprofen     Patient Active Problem List  Diagnoses  . Hypertension  . Obesity  . Dysuria  . Hematuria  . Aortic stenosis  . Sleep apnea  . Gout  . Carotid arterial disease    History  Smoking status  . Former Smoker -- 1.5 packs/day for 7 years  . Types: Cigarettes  . Quit date: 04/03/1971  Smokeless tobacco  . Never Used    History  Alcohol Use No    Family History  Problem Relation Age of Onset  . Heart attack Father     Review of Systems:   The patient denies any heat or cold intolerance.  No weight gain or weight loss.  The patient denies headaches or blurry vision.  There is no cough or sputum production.  The patient denies dizziness.  There is no hematuria or hematochezia.  The patient denies any muscle aches or arthritis.  The patient denies any rash.  The patient denies frequent falling or instability.  There is no history of depression or anxiety.  All other systems were reviewed and are negative.   Physical Exam:   He is moderately obese. Blood pressure is 136/80. Heart rate is 56. Weight is 254. BMI is 36.The head is normocephalic and atraumatic.  Pupils are equally round and reactive to light.  Sclerae nonicteric.  Conjunctiva is clear.  Oropharynx is unremarkable.  There's adequate oral airway.  Neck is supple there are no masses.  Thyroid is not enlarged.  There is no lymphadenopathy.  Lungs are clear.  Chest is symmetric.  Heart shows a regular rate and rhythm.  S1 and S2 are normal.  There is a grade 2/6 systolic outflow murmur that radiates to the carotids.  Abdomen is soft normal bowel sounds.  There is no organomegaly.  Genital and rectal deferred.  Extremities are without edema.  Peripheral pulses are adequate.  Neurologically intact.  Full range of motion.  The patient is  not depressed.  Skin is warm and dry.  Assessment / Plan:

## 2011-04-04 NOTE — Op Note (Signed)
[place]  Patient:    Andre Miranda, [place] Visit Number: [id number] MRN: [id number]          Service Type: SUR Location: 2300 2312 01 Attending Physician:  Waldo Laine Dictated by:   Gwenith Daily Tyrone Sage, M.D. Proc. Date: 05/04/02 Admit Date:  05/04/2002   CC:         Colleen Can. Deborah Chalk, M.D.   Operative Report  PREOPERATIVE DIAGNOSIS:  Coronary occlusive disease.  POSTOPERATIVE DIAGNOSIS:  Coronary occlusive disease.  PROCEDURE:  Coronary artery bypass grafting x4 with the left internal mammary artery to the left anterior descending coronary artery, right internal mammary to the distal right coronary artery, sequential reversed saphenous vein graft to the intermediate and distal circumflex.  SURGEON:  Gwenith Daily. Tyrone Sage, M.D.  FIRST ASSISTANT:  Sherrie George, P.A.  BRIEF HISTORY:  The patient is a 75 year old male who presented with increasing chest discomfort and shortness of breath.  He was seen by Colleen Can. Deborah Chalk, M.D., and evaluated with cardiac catheterization, which demonstrated significant disease in the right coronary artery with greater than 90% proximal stenosis, at least 50% left main, diffuse disease throughout the LAD, and total occlusion of the distal circumflex branch.  Because of the patients left main disease and three-vessel coronary artery disease, coronary artery bypass grafting was recommended to the patient, who agreed and signed informed consent.  DESCRIPTION OF PROCEDURE:  With Swan-Ganz and arterial line monitor in place, the patient underwent general endotracheal anesthesia without incident.  The skin of the chest and legs was prepped with Betadine and draped in the usual sterile manner.  A segment of vein was harvested from the right lower extremity and was of good quality and caliber.  A median sternotomy was performed.  The left internal mammary artery was dissected down as a  pedicle graft.  The distal artery was divided and had a good, free flow.  In a similar fashion, the right internal mammary artery was dissected down and had good, free flow.  Both of the vessels were hydrostatically dilated with heparinized saline.  Attention was then turned to the pericardium, which was opened. Because of the patients large body size with a weight of 253 pounds and a body surface are of 2.3 sq. m, a 22 French aortic cannula was used.  After systemic heparinization, the right atrium was cannulated.  Consideration for off-pump bypass was abandoned because of the patients very fatty-infiltrated heart, diffusely diseased LAD, and generalized obesity making visualization difficulty.  Sites of anastomosis were selected after the patient was placed on cardiopulmonary bypass.  An aortic root vent cardioplegia needle was introduced into the ascending aorta.  The patients body temperature was cooled to 30 degrees, the aortic crossclamp was applied, and 500 cc of cold blood potassium cardioplegia was administered with rapid diastolic arrest of the heart, and myocardial septal temperature was monitored throughout the crossclamp period.  Attention was turned first to the bifurcating intermediate vessel.  The larger of the two branches of this vessel, the more posterior one, was intramyocardial and was identified and opened.  The distal circumflex, which was totally occluded, was also located.  Using a diamond-type side-to-side anastomosis was carried out between the intermediate coronary artery and the distal extent of the same vein was then carried to the totally-occluded distal circumflex.  The intermediate was 1.5 mm in size and of good quality.  The distal circumflex was a much smaller, diffusely diseased vessel only admitting a 1 mm probe  distally.  Attention was then turned to the distal right coronary artery, which was opened.  The right internal mammary was used as a pedicle  graft, trimmed to appropriate length, and anastomosed with a running 8-0 Prolene to the distal right coronary artery.  The fascia was tacked to the epicardium.  The attention was then turned to the left anterior descending coronary artery.  This vessel was diffusely diseased and admitted a 1 mm probe distally and proximally.  Using a running 8-0 Prolene, the left internal mammary artery was anastomosed to the left anterior descending coronary artery.  With release of the Edwards bulldog on the two mammary arteries, there was prompt rise in myocardial septal temperature.  The aortic crossclamp was removed, total crossclamp time of 87 minutes.  A partial occlusion clamp was placed on the ascending aorta and a single punch aortotomy was created and the vein graft to the intermediate and distal circumflex was anastomosed to the ascending aorta.  Air was evacuated from the grafts, the partial occlusion clamp was removed.  Sites of anastomosis were inspected and were free of bleeding.  The patient was then ventilated and weaned from cardiopulmonary bypass without difficulty.  He remained hemodynamically stable.  He was decannulated in the usual fashion.  Protamine sulfate was administered with the operative field hemostatic.  Two atrial and two ventricular pacing wires were applied and graft markers applied.  A left pleural tube and two mediastinal tubes were left in place.  The sternum was closed with #6 stainless steel wire, the fascia closed with interrupted 0 Vicryl, running 3-0 Vicryl in the subcutaneous tissue, 4-0 subcuticular stitch, in the skin edges.  Dry dressings were applied.  Sponge and needle count was reported as correct at the completion of the procedure.  The patient tolerated the procedure without obvious complication and was transferred to the surgical intensive care unit for further postoperative care.  The patient did not require any blood bank blood products during the  operative procedure. Dictated by:   Gwenith Daily Tyrone Sage, M.D. Attending Physician:  Waldo Laine DD:  05/05/02  TD:  05/06/02 Job: [id number]

## 2011-04-04 NOTE — H&P (Signed)
[place]. [facility]  Patient:    Andre Miranda, Andre Miranda Visit Number: [id number] MRN: [id number]          Service Type: MED Location: 2000 2011 01 Attending Physician:  Eleanora Neighbor Dictated by:   Jennet Maduro Earl Gala, R.N., A.N.P.-C. Admit Date:  04/10/2002 Discharge Date: 04/11/2002   CC:         Velora Heckler, M.D.  Rodrigo Ran, M.D.   History and Physical  CHIEF COMPLAINT: None.  HISTORY OF PRESENT ILLNESS: Andre Miranda is a 75 year old white male who was recently hospitalized toward the latter part of May 2003 with a prolonged episode of epigastric discomfort.  He was admitted to the hospital and had a CT scan which revealed gallstones and subsequently a positive HIDA scan.  He ruled out negative for myocardial infarction per serial enzymes and was subsequently seen by Dr. Darnell Level for possible upcoming surgical procedure. He was referred for an outpatient stress Cardiolite study for preoperative clearance.  With that he demonstrated poor exercise tolerance.  He had inferolateral EKG changes with exercise that resolved quickly in recovery, with a mild degree of chest discomfort associated.  His scintigraphic images were abnormal.  He is now referred on for cardiac catheterization.  PAST MEDICAL HISTORY:  1. Recent admission for epigastric discomfort, felt to be due to acute     cholecystitis.  2. Obesity.  3. History of diverticulitis.  4. Sleep apnea.  5. Hypertriglyceridemia.  6. Hypertension.  7. Positive family history.  8. Hypertensive heart disease.  ALLERGIES: None.  CURRENT MEDICATIONS:  1. Protonix 40 mg q.d.  2. Multivitamin q.d.  3. Ecotrin q.d.  4. Lopressor 50 mg b.i.d.  5. Hyzaar 50/12.5 mg q.d.  FAMILY HISTORY: His father died at 34 of a myocardial infarction but had had previous MI as well.  His mother is alive at age 74 and doing well.  He has a sister who has two stents in place.  SOCIAL HISTORY: He is  married.  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  He is a nonsmoker and stopped 33 years ago.  There is rare alcohol use.  REVIEW OF SYSTEMS: Basically as noted above.  He has done well since his recent admission to the hospital.  He has had no further bouts of abdominal or chest discomfort.  His activities have been light.  He has tolerated all his medicines without problems, and otherwise the Review Of Systems is unremarkable.  PHYSICAL EXAMINATION:  GENERAL: He is a very pleasant obese white male, in no acute distress.  VITAL SIGNS: Blood pressure 150/90 sitting, 130/80 standing.  Heart rate is 60 and regular.  Respirations 18.  He is afebrile.  Weight is 250 pounds.  SKIN: Warm and dry.  Color is unremarkable.  LUNGS: Clear.  HEART: Regular rhythm with a grade 2/6 systolic ejection murmur.  ABDOMEN: Obese.  Positive bowel sounds.  Nontender.  EXTREMITIES: Full.  No pitting edema.  NEUROLOGIC: Intact with no gross focal deficits.  LABORATORY DATA: Pending.  IMPRESSION: Recent bout of prolonged epigastric discomfort in the setting of known cholecystitis, and now with a positive stress Cardiolite study.  PLAN: In light of his other medical problems and his multiple cardiovascular risk factors we will need to proceed on with cardiac catheterization to further discern the etiology as well as to serve as preoperative clearance. The procedure has been discussed in full detail with both him and his wife, and he is willing to  proceed on. Dictated by:   Jennet Maduro Earl Gala, R.N., A.N.P.-C. Attending Physician:  Eleanora Neighbor DD:  04/25/02 TD:  04/27/02 Job: [id number]

## 2011-04-04 NOTE — Discharge Summary (Signed)
NAME:  Andre Miranda, Andre Miranda NO.:  [id number]   MEDICAL RECORD NO.:  [id number]                   PATIENT TYPE:  NP   LOCATION:  2018                                 FACILITY:  [facility]   PHYSICIAN:  Jody P. Melvyn Neth, P.A.                 DATE OF BIRTH:  07/31/1936   DATE OF ADMISSION:  05/04/2002  DATE OF DISCHARGE:  05/09/2002                                 DISCHARGE SUMMARY   ADMISSION DIAGNOSES:  1. Unstable angina.  2. Positive stress test.  3. Severe three vessel coronary artery disease with 50% left main, preserved     left ventricular function.   FINAL DIAGNOSES:  1. Unstable angina.  2. Positive stress test.  3. Severe three vessel coronary artery disease with 50% left main coronary     artery and preserved left ventricular function.  4. Hypertension.  5. Hyperlipidemia.  6. Volume overload.  7. Obesity.  8. Remote smoking.  9. Sleep apnea with CPAP.  10.      Cholelithiasis.  11.      Diverticulosis.   PROCEDURE:  Coronary artery bypass grafting times four on May 04, 2002 with  the following grafts:  Left internal mammary artery to left anterior  descending, right internal mammary artery to distal right coronary artery  and saphenous vein graft from OM to circumflex.   BRIEF HISTORY:  The patient is a 75 year-old male with no previous history  of coronary artery disease who had chest pain over the Memorial Day weekend.  He was hospitalized on Apr 10, 2002. He ended up having a stress test which  was positive, selective cardiac catheterization and subsequent referral for  bypass surgery.  Dr. Tyrone Sage evaluated the patient at the office on April 28, 2002.  He recommended coronary artery bypass grafting.  He also was  noted to need a cholecystectomy at some point.  He had been worked up for  this during his previous admission.   HOSPITAL COURSE:  He was admitted to the hospital for the elective procedure  on May 04, 2002.  There were no  complications.  He was taken to SICU in  stable condition. Postoperatively he did well with routine care. There were  no problems.  He was transferred to Unit 2000.  Because of his previous  gallbladder disease his abdomen was watched closely, but he did not have any  problems with that.  He did have some sciatica pain in his legs, but he has  had that in the past.  By postoperative day five, May 09, 2002, he was  doing very well. He was up walking around on his own, afebrile, vital signs  were stable, he was in sinus rhythm and blood pressure was 100/62.  His T-  saturation was 92% on room air.  He was diuresing well.  Physical  examination was satisfactory.  He had some mild lower extremity  edema and  mild crackles in his lungs.  His wounds were healing well. He was ready for  discharge and subsequently discharged home.   DISCHARGE MEDICATIONS:  1. Enteric coated aspirin 325 mg one daily.  2. Altace 2.5 mg daily.  3. Lasix 40 mg daily.  4. KCL 20 mEq daily.  5. Lopressor 50 mg one p.o. b.i.d..  6. Protonix 40 mg daily.  7. Percocet 5/325 mg one to two every four to six hours p.r.n. for pain.   ALLERGIES:  No known drug allergies.   CONDITION:  Stable.   DISPOSITION:  Home.   SPECIAL INSTRUCTIONS:  1. He was told to avoid driving, heavy lifting, strenuous activity, working     and he was told to walk daily and use his incentive spirometer daily.  2. He was told that he could shower and to clean his wounds gently daily     with soap and water and call the office if he had any wound problems or     concerns.  3. He was told to get a chest x-ray at his cardiologist's office when he saw     him in two weeks and to bring that with him to see Dr. Tyrone Sage.   FOLLOW UP:  1. Follow-up with Dr. Deborah Chalk two weeks after discharge.  2. Follow-up with Dr. Tyrone Sage in three weeks after discharge. The office     will call with an appointment.                                                Jody P. Diamond Nickel.    JPL/MEDQ  D:  06/16/2002  T:  06/22/2002  Job:  [id number]   cc:   Colleen Can. Deborah Chalk, M.D.   Redge Gainer. Perini, M.D.

## 2011-04-04 NOTE — Op Note (Signed)
NAME:  Andre Miranda, Andre Miranda NO.:  [id number]   MEDICAL RECORD NO.:  [id number]                   PATIENT TYPE:  OIB   LOCATION:  5740                                 FACILITY:  [facility]   PHYSICIAN:  Velora Heckler, M.D.                DATE OF BIRTH:  09-05-1936   DATE OF PROCEDURE:  08/15/2002  DATE OF DISCHARGE:                                 OPERATIVE REPORT   PREOPERATIVE DIAGNOSIS:  Symptomatic cholelithiasis, chronic cholecystitis.   POSTOPERATIVE DIAGNOSIS:  Symptomatic cholelithiasis, chronic cholecystitis.   PROCEDURE:  Laparoscopic cholecystectomy with intraoperative  cholangiography.   SURGEON:  Velora Heckler, M.D.   ASSISTANT:  Sandria Bales. Ezzard Standing, M.D.   ANESTHESIA:  General.   ESTIMATED BLOOD LOSS:  Minimal.   PREPARATION:  Betadine.   COMPLICATIONS:  None.   INDICATIONS:  The patient is a 75 year old white male seen originally in May  of 2003 as an inpatient surgical consult at [facility] for  symptomatic cholelithiasis.  The patient subsequently underwent cardiac  evaluation and ultimately had four-vessel coronary artery bypass grafting by  Dr. Ofilia Neas in June 2003.  The patient now returns after cardiac  clearance for cholecystectomy.   BODY OF REPORT:  The procedure was done in OR #17 at the [facility].  The patient was brought to the operating room and placed  in a supine position on the operating room table.  Following administration  of general anesthesia, the patient was prepped and draped in the usual  strict aseptic fashion.  After ascertaining that an adequate level of  anesthesia had been obtained, an infraumbilical incision was made in the  midline with a #15 blade.  Dissection was carried down through the  subcutaneous tissues.  The fascia was incised in the midline and the  peritoneal cavity was entered cautiously.  A #0 Vicryl pursestring suture  was placed in the fascia.  Then a  Hasson cannula was introduced under direct  vision and secured with a pursestring suture.  The abdomen was insufflated  with carbon dioxide.  The laparoscope was introduced.  The abdomen was  explored.  The operative ports were placed along the right costal margin in  the midline, midclavicular line, and the anterior axillary line.  The fundus  of the gallbladder was identified and retracted cephalad.  Significant  omental adhesions to the undersurface of the gallbladder were taken down  using the electrocautery for hemostasis.  The neck of the gallbladder was  identified and carefully dissected out.  The cystic duct was identified and  dissected out along its length.  A clip was placed at the neck of the  gallbladder.  The cystic duct is incised.  Clear bile emanates from the  cystic duct.   A Cook cholangiography catheter is introduced into the right upper quadrant  through a stab wound.  It is inserted into the  cystic duct and secured with  two Ligaclips.  Using C-arm fluoroscopy, Realtime cholangiography is  performed.  There is rapid filling of a normal caliber biliary tree.  There  is reflux of contrast into the right and left ductal systems.  There is free  flow of contrast distally into the duodenum without filling defect or  evidence of obstruction.  The [facility] catheter is withdrawn and clips are  retrieved.  The cystic duct is triply clipped and divided.  The cystic  artery is doubly clipped and divided.  The gallbladder is then excised from  the gallbladder bed using the hook electrocautery for hemostasis.  The  gallbladder is completely excised from the gallbladder bed and placed into  an EndoCatch bag.  It is withdrawn through the umbilical port.  It contains  gallstones.  The right upper quadrant is irrigated copiously with warm  saline.  Good hemostasis is noted.  Bile is aspirated and irrigated clean.  The ports are removed under direct vision.  Pneumoperitoneum is  released.  The 0 Vicryl pursestring suture had been tied securely at the umbilicus.  All ports are removed.  The pneumoperitoneum is completely evacuated.  The  port sites are anesthetized with local anesthetic.  The wounds are closed  with interrupted 4-0 Vicryl subcuticular sutures.  The wounds are dressed  with Benzoin and Steri-Strips followed by dry gauze dressings.  The patient  is awakened from anesthesia and brought to the recovery room in stable  condition.  The patient tolerated the procedure well.                                               Velora Heckler, M.D.    TMG/MEDQ  D:  08/15/2002  T:  08/16/2002  Job:  [id number]   cc:   Colleen Can. Deborah Chalk, M.D.  [address], [address]  [place]  [place] [address]  Fax: [phone]   Gwenith Daily. Tyrone Sage, M.D.  [address]  [place]  [place] [address]  Fax: [phone]   Loraine Leriche A. Perini, M.D.   Velora Heckler, M.D.  Fax: [phone]

## 2011-04-04 NOTE — Cardiovascular Report (Signed)
[place]. [facility]  Patient:    Andre Miranda, Andre Miranda Visit Number: [id number] MRN: [id number]          Service Type: CAT Location: [facility] 2899 11 Attending Physician:  Eleanora Neighbor Dictated by:   Colleen Can Deborah Chalk, M.D. Proc. Date: 04/27/02 Admit Date:  04/27/2002 Discharge Date: 04/27/2002   CC:         Rodrigo Ran, M.D.   Cardiac Catheterization  INDICATIONS:  [name] presented with chest pain and had documented cholecystitis, but had a follow-up nuclear stress test which showed clearcut abnormalities and is referred for evaluation.  PROCEDURE:  Left heart catheterization with selective coronary angiography and left ventricular angiography.  TYPE AND SITE OF ENTRY:  Percutaneous right femoral artery with Perclose.  CATHETERS:  6 French 4 curved Judkins right and left coronary catheters, 6 French pigtail ventriculography catheter.  CONTRAST:  Omnipaque.  MEDICATIONS:  Prior to the procedure; Valium 10 mg p.o.  During the procedure; Versed 2 mg IV, fentanyl 25 mcg IV.  COMMENTS:  The patient tolerated the procedure well.  HEMODYNAMIC DATA:  The aortic pressure was 147/81, left ventricular pressure 148/20. There was no aortic valve gradient noted on pullback.  ANGIOGRAPHIC DATA: 1. Left main coronary artery had a 50% distal stenosis. 2. Intermediate coronary had irregularities without significant focal disease,    but there was diffuse disease present. 3. Left anterior descending had diffuse 50% narrowings in a sequential manner    throughout the vessel.  It appeared to have satisfactory flow. 4. Left circumflex had an obtuse marginal that was totally occluded and filled    by retrograde collaterals.  It would appear to be suitable for bypass    grafting distally. 5. Right coronary artery is a small, but dominant vessel.  It has sequential    95% stenoses present.  The posterior descending and posterolateral branch    are both  moderate size, but they appear to be connected and would not have    significant obstruction present in the segment of vessel between them.  LEFT VENTRICULAR ANGIOGRAM:  Left ventricular angiogram was performed in the RAO position.  Overall cardiac size was normal.  There was mild inferior basilar hypokinesia.  The global ejection fraction is 55%.  There is no intracardiac calcification or intracavitary filling defect or mitral regurgitation.  OVERALL IMPRESSION: 1. Severe three vessel disease, 50% left main stenosis, totally occluded    left circumflex, 95% right coronary artery, and diffuse disease in the    left anterior descending with no greater than 50% obstruction.  DISCUSSION:  In light of these findings, will refer [name] for coronary artery bypass grafting. Dictated by:   Colleen Can Deborah Chalk, M.D. Attending Physician:  Eleanora Neighbor DD:  04/27/02 TD:  04/29/02 Job: [id number]

## 2011-04-04 NOTE — Discharge Summary (Signed)
[place]. [facility]  Patient:    Andre Miranda, Andre Visit Number: [id number] MRN: [id number]          Service Type: MED Location: 2000 2011 01 Attending Physician:  Eleanora Neighbor Dictated by:   Jennet Maduro Earl Gala, R.N., A.N.P. Admit Date:  04/10/2002 Discharge Date: 04/11/2002   CC:         Rodrigo Ran, M.D.  Velora Heckler, M.D.   Discharge Summary  PRIMARY DISCHARGE DIAGNOSES: 1. Prolonged episode of epigastric pain with a known history of gallstones    and subsequent abnormal hepatobiliary scan. 2. Hypertension. 3. Mild renal insufficiency. 4. History of sleep apnea. 5. Obesity.  HISTORY OF PRESENT ILLNESS:  The patient is a 75 year old white male who presented to the emergency department on Apr 10, 2002 with a several hour history of epigastric discomfort.  The patient had felt fine the night prior until approximately 11:00 p.m.  At that time the patient developed epigastric discomfort that was constant.  He had associated belching and claminess. His blood pressure was taken at home and was 190/80.  He had eaten a significant amount of fried chicken at approximately 5:00 p.m. and then ate some nuts at approximately 10:00 p.m. that evening when he was feeling well without symptoms.  Since 11 oclock p.m., however, his discomfort had been steady. There was no shortness of breath. He had had some nausea with no significant vomiting.  He took nitroglycerin with questionable relief.  He subsequently presented to the emergency department for further evaluation.  It was noted the patient did have gallstones as well as an enlarged liver from an evaluation in February 2003.  A CT scan of the abdomen was subsequently performed and the patient was admitted for further evaluation.  Please see the dictated history and physical for further patient presentation and profiling.  LABORATORY DATA ON ADMISSION:  CBC was normal.  Chemistries were normal  except for a BUN of 28, glucose was 126.  Protime and PTT were unremarkable.  Liver function tests were normal except for total bilirubin slightly elevated at 1.3. Lipase was 27.  Cardiac enzymes were negative.  Lipid profile showed a total cholesterol of 159, triglycerides were 89, HDL 46, LDL 95.  Portable chest x-ray showed question of mild fullness in the right hilar region.  A PA and lateral film is recommended.  CT scan of abdomen showed mild coronary atherosclerotic calcifications with a few small scattered pulmonary nodules including a 4 mm nodule in the right middle lobe, a 4 mm subpleural nodule in the right middle lobe, a tiny 2.5 mm nodule in the right middle lobe inferiorly and a 3 mm nodule on the right lung base as well as a 4 mm nodule in the left lung base, all of which were felt to be nonspecific.  There was no pleural or pericardial effusion.  The liver, spleen, adrenal glands, pancreas and kidneys were unremarkable.  There were two calcified gallstones.  The gallbladder was mildly distended but there was no definite pericholecystic inflammation or gallbladder wall thickening noted. There were no enlarged lymph nodes or free fluid.  The bowel was grossly unremarkable.  A nuclear medicine hepatobiliary scan showed nonfilling of the gallbladder despite administration of morphine.  There was significant reflux of the radiotracer from the duodenum into the stomach.  HOSPITAL COURSE:  The patient was admitted and ruled out negative for myocardial infarction.  His discomfort was not really felt to be cardiac in origin but  was felt to be more consistent with gallbladder disease.  Following completion of serial cardiac enzymes as well as treatment of his discomfort with morphine the patient did well without any further complaints.  He was seen and evaluated by Dr. Darnell Level on Apr 11, 2002 who concurred also that the patient was suffering from biliary colic and  cholelithiasis at that time and outpatient evaluation will occur to include stress Cardiolite study and then the patient will proceed on with elective cholecystectomy.  DISCHARGE CONDITION:  Stable.  DISCHARGE MEDICATIONS:  Patient will resume all of his previous home medications.  We will add Protonix 40 mg q.d.  FOLLOW UP:  Patient is to call our office on Tuesday and we will make arrangements to set him up for stress Cardiolite testing.  He is to call the office should any problems arise in the meantime. Dictated by:   Jennet Maduro Earl Gala, R.N., A.N.P. Attending Physician:  Eleanora Neighbor DD:  04/12/02 TD:  04/13/02 Job: [id number]

## 2011-04-04 NOTE — Consult Note (Signed)
[place]. [facility]  Patient:    Andre Miranda, Andre Miranda Visit Number: [id number] MRN: [id number]          Service Type: MED Location: 2000 2011 01 Attending Physician:  Eleanora Neighbor Dictated by:   Velora Heckler, M.D. Proc. Date: 04/11/02 Admit Date:  04/10/2002 Discharge Date: 04/11/2002   CC:         Colleen Can. Deborah Chalk, M.D.  Rodrigo Ran, M.D.   Consultation Report  REFERRING PHYSICIAN:  Colleen Can. Deborah Chalk, M.D.  PRIMARY CARE PHYSICIAN:  Rodrigo Ran, M.D.  REASON FOR CONSULTATION:  Abdominal pain, cholelithiasis.  BRIEF HISTORY:  The patient is a 75 year old white male admitted urgently on Apr 10, 2002 to the cardiology service for epigastric and substernal pain associated with nausea.  The patient was seen and evaluated by cardiology. Cardiac evaluation was negative.  The patient has a history of known gallstones.  This has been verified by CT scan in February of 2003.  The patient underwent nuclear medicine hepatobiliary scan this admission, which demonstrated cystic duct obstruction.  The patient is now completely asymptomatic.  He would like to be discharged and have the rest of his cardiac workup and general surgical workup performed as an outpatient and then schedule for elective cholecystectomy.  PAST MEDICAL HISTORY: 1. History of hypertension. 2. History of gallstones. 3. History of mild renal insufficiency. 4. History of sleep apnea. 5. History of substernal chest pain.  MEDICATIONS: 1. Hyzaar 50/12.5. 2. Metoprolol 50 mg b.i.d. 3. Ecotrin 325 mg daily. 4. Multivitamins.  ALLERGIES:  None known.  SOCIAL HISTORY:  The patient is married and accompanied by his wife.  He is currently retired.  He stopped smoking 33 years ago.  He drinks a rare alcoholic beverage.  FAMILY HISTORY:  Unremarkable.  PHYSICAL EXAMINATION:  GENERAL:  A 75 year old, mildly obese white male in no acute distress.  HEENT:  Normocephalic.   Sclerae are clear.  Dentition is good.  NECK:  Supple without mass.  LUNGS:  Clear to auscultation.  CARDIAC:  Shows a regular rate and rhythm.  ABDOMEN:  Soft without distention.  There are active bowel sounds present. There are no surgical wounds.  There is no tenderness.  There are no palpable masses.  EXTREMITIES:  Nontender without edema.  NEUROLOGIC:  The patient is alert and oriented to person, place, and time without focal neurologic deficit.  LABORATORY STUDIES:  From admission:  White count 6.9, hemoglobin 15.0, hematocrit 42.5%, platelet count 156,000, differential is normal.  Chemistry profile is normal with normal liver function tests and a total bilirubin of 1.2.  Prothrombin is normal at 13.5 with an INR of 1.0.  IMPRESSION:  Symptomatic cholelithiasis.  PLAN:  I agree with plans for discharge today.  The patient will follow up at my office at [facility] in 1-2 weeks.  In the interval, we will ask Dr. Deborah Chalk to complete the patients outpatient cardiac evaluation.  We will then schedule him for elective cholecystectomy for treatment of cholelithiasis and biliary colic.  The patient and his wife are in agreement with this plan. Dictated by:   Velora Heckler, M.D. Attending Physician:  Eleanora Neighbor DD:  04/11/02 TD:  04/12/02 Job: [id number]

## 2011-04-04 NOTE — Procedures (Signed)
[facility]  Patient:    Andre Miranda, Andre Miranda Visit Number: [id number] MRN: [id number]          Service Type: END Location: ENDO Attending Physician:  Orland Mustard Dictated by:   Llana Aliment. Randa Evens, M.D. Proc. Date: 08/30/01 Admit Date:  08/30/2001   CC:         Rodrigo Ran, M.D.   Procedure Report  PROCEDURE:  Colonoscopy and biopsy.  ENDOSCOPIST:  Llana Aliment. Edwards, M.D.  MEDICATIONS:  Fentanyl 75 mcg, Versed 8 mg IV.  INDICATIONS:  Patient with rectal bleeding left lower quadrant.  Pain and presumed diverticulitis.  DESCRIPTION OF PROCEDURE:  The procedure had been explained to the patient and consent obtained.  With the patient in the left lateral decubitus position, the Olympus adult video colonoscope was inserted and advanced under direct visualization.  The prep was quite good.  We advanced to the cecum without difficulty. The ileocecal valve and appendiceal orifice were seen.  The scope was withdrawn.  The cecum, ascending colon, hepatic flexure, transverse colon, splenic flexure, descending and sigmoid colon were seen well.  Marked diverticular disease of the sigmoid colon, marked inflammation and reddened areas throughout the entire sigmoid colon, marked diverticulosis.  Several random biopsies were taken to rule out some other form of colitis.  The scope was withdrawn.  The patient tolerated the procedure well.  The rectum was free of any lesions.  He was maintained on low flow oxygen and pulse oximeter.  ASSESSMENT: 1. Probable mild diverticulitis of the sigmoid colon. 2. Marked diverticulosis of the left colon.  PLAN:  We will treat the patient with Septra.  He was given a sheet of instructions about diverticulosis.  We will see back in the office in three to four weeks. Dictated by:   Llana Aliment. Randa Evens, M.D. Attending Physician:  Orland Mustard DD:  08/30/01 TD:  08/30/01 Job: [id number]

## 2011-04-04 NOTE — Assessment & Plan Note (Signed)
Arrange for 2-D echocardiogram. I will have him see Dr. Shirlee Latch in followup. I think some of the pulsations he hears are related to his aortic stenosis.

## 2011-04-15 ENCOUNTER — Ambulatory Visit (HOSPITAL_COMMUNITY): Payer: Medicare Other | Attending: Cardiology | Admitting: Radiology

## 2011-04-15 DIAGNOSIS — I359 Nonrheumatic aortic valve disorder, unspecified: Secondary | ICD-10-CM | POA: Insufficient documentation

## 2011-04-15 DIAGNOSIS — I350 Nonrheumatic aortic (valve) stenosis: Secondary | ICD-10-CM

## 2011-04-25 ENCOUNTER — Ambulatory Visit (INDEPENDENT_AMBULATORY_CARE_PROVIDER_SITE_OTHER): Payer: Medicare Other | Admitting: Internal Medicine

## 2011-04-25 ENCOUNTER — Encounter: Payer: Self-pay | Admitting: Internal Medicine

## 2011-04-25 VITALS — BP 112/64 | HR 60 | Ht 70.0 in | Wt 254.0 lb

## 2011-04-25 DIAGNOSIS — G4730 Sleep apnea, unspecified: Secondary | ICD-10-CM

## 2011-04-25 NOTE — Progress Notes (Signed)
  Subjective:    Patient ID: Andre Miranda, male    DOB: 16-Aug-1936, 75 y.o.   MRN: [id number]  HPI 04/25/11- 85 yoM former smoker for re-establishment for sleep apnea. He really likes his CPAP which is set at 9 as of last visit here in 2006. We discussed available treatments. Original NPSG is available and will be scanned. Medical issues of OSA reviewed.   Review of Systems Constitutional:   No weight loss, night sweats,  Fevers, chills, fatigue, lassitude. HEENT:   No headaches,  Difficulty swallowing,  Tooth/dental problems,  Sore throat,                No sneezing, itching, ear ache, nasal congestion, post nasal drip,   CV:  No chest pain,  Orthopnea, PND, swelling in lower extremities, anasarca, dizziness, palpitations  GI  No heartburn, indigestion, abdominal pain, nausea, vomiting, diarrhea, change in bowel habits, loss of appetite  Resp: No shortness of breath with exertion or at rest.  No excess mucus, no productive cough,  No non-productive cough,  No coughing up of blood.  No change in color of mucus.  No wheezing.  Skin: no rash or lesions.  GU: no dysuria, change in color of urine, no urgency or frequency.  No flank pain.  MS:  No joint pain or swelling.  No decreased range of motion.  No back pain.  Psych:  No change in mood or affect. No depression or anxiety.  No memory loss.      Objective:   Physical Exam General- Alert, Oriented, Affect-appropriate, Distress- none acute  overweight  Skin- rash-none, lesions- none, excoriation- none  Lymphadenopathy- none  Head- atraumatic  Eyes- Gross vision intact, PERRLA, conjunctivae clear, secretions  Ears- Hearing, canals, Tm - normal  Nose- Clear, No-Septal dev, mucus, polyps, erosion, perforation Mild external deviation  Throat- Mallampati II , mucosa clear , drainage- none, tonsils- atrophic  Neck- flexible , trachea midline, no stridor , thyroid nl, carotid no bruit   Thick neck  Chest - symmetrical  excursion , unlabored     Heart/CV- RRR , 2/6 precordial systolic murmur c/w AS , no gallop  , no rub, nl s1 s2                     - JVD- none , edema- none, stasis changes- none, varices- none     Lung- clear to P&A, wheeze- none, cough- none , dullness-none, rub- none I don't hear crackles suggestive of asbestosis    Chest wall- Abd- tender-no, distended-no, bowel sounds-present, HSM- no  Br/ Gen/ Rectal- Not done, not indicated  Extrem- cyanosis- none, clubbing, none, atrophy- none, strength- nl  Neuro- grossly intact to observation         Assessment & Plan:

## 2011-04-25 NOTE — Assessment & Plan Note (Signed)
His machine is 73 years old. We will ask [employer] to replace now, keeping the same pressure.

## 2011-04-25 NOTE — Patient Instructions (Signed)
Order- Christoper Allegra- Replacement CPAP machine, heated humidifier, pressure 9, dx OSA

## 2011-04-29 ENCOUNTER — Telehealth: Payer: Self-pay | Admitting: *Deleted

## 2011-04-29 NOTE — Telephone Encounter (Signed)
Pt notified of echo results

## 2011-04-29 NOTE — Telephone Encounter (Signed)
Message copied by Adolphus Birchwood on Tue Apr 29, 2011 10:01 AM ------      Message from: Roger Shelter      Created: Thu Apr 17, 2011  1:48 PM       Mod AS.  Overall ok.

## 2011-05-05 ENCOUNTER — Telehealth: Payer: Self-pay | Admitting: Internal Medicine

## 2011-05-05 DIAGNOSIS — G4730 Sleep apnea, unspecified: Secondary | ICD-10-CM

## 2011-05-05 NOTE — Telephone Encounter (Signed)
I spoke with Andre Miranda at [place]. She states that we gave order for fixed pressure of 9, but pt states that he has always had CPAP set on 10 and wants to keep it that way. Please advise recs thanks

## 2011-05-05 NOTE — Telephone Encounter (Signed)
Per last referral note to [facility] stated CPAP should be at fixed pressure of 9. LMOM for Haley TCB.

## 2011-05-05 NOTE — Telephone Encounter (Signed)
Per CY-ok to change ordered CPAP pressure to 10. Order placed.

## 2011-05-08 ENCOUNTER — Encounter: Payer: Self-pay | Admitting: Internal Medicine

## 2011-06-11 ENCOUNTER — Ambulatory Visit
Admission: RE | Admit: 2011-06-11 | Discharge: 2011-06-11 | Disposition: A | Discharge status: Home / Self Care | Payer: Medicare Other | Source: Ambulatory Visit | Attending: Internal Medicine | Admitting: Internal Medicine

## 2011-06-11 ENCOUNTER — Other Ambulatory Visit: Payer: Self-pay | Admitting: Internal Medicine

## 2011-06-11 DIAGNOSIS — M549 Dorsalgia, unspecified: Secondary | ICD-10-CM

## 2011-06-16 ENCOUNTER — Encounter: Payer: Self-pay | Admitting: Internal Medicine

## 2011-06-16 ENCOUNTER — Ambulatory Visit (INDEPENDENT_AMBULATORY_CARE_PROVIDER_SITE_OTHER): Payer: Medicare Other | Admitting: Internal Medicine

## 2011-06-16 VITALS — BP 90/60 | HR 54 | Ht 70.0 in | Wt 249.0 lb

## 2011-06-16 DIAGNOSIS — G4730 Sleep apnea, unspecified: Secondary | ICD-10-CM

## 2011-06-16 NOTE — Assessment & Plan Note (Signed)
Good compliance and control now on 10 cwp / [employer]. He is quite pleased. Still encouraged to lose weight

## 2011-06-16 NOTE — Patient Instructions (Signed)
Continue CPAP 10. If your download suggests a change, we may try it. If we make a change you don't like- Please let us know and we can go back.

## 2011-06-16 NOTE — Progress Notes (Signed)
  Subjective:    Patient ID: Andre Miranda, male    DOB: 1935-12-18, 75 y.o.   MRN: [id number]  HPI    Review of Systems     Objective:   Physical Exam        Assessment & Plan:   No problem-specific assessment & plan notes found for this encounter.  Subjective:    Patient ID: Andre Miranda, male    DOB: 12/29/1935, 75 y.o.   MRN: [id number]  HPI 04/25/11- 56 yoM former smoker for re-establishment for sleep apnea. He really likes his CPAP which is set at 9 as of last visit here in 2006. We discussed available treatments. Original NPSG is available and will be scanned. Medical issues of OSA reviewed.   06/16/11- 74 yoM former smoker followed for OSA complicated by hx CAD/PAD, aortic stenosis Continues CPAP 10 [employer]. He uses new machine all night every night, with nasal pillows mask.  Breathing is ok otherwise. Download chip gets picked up tomorrow. He missed only one night when he had some congestion.   Review of Systems Constitutional:   No weight loss, night sweats,  Fevers, chills, fatigue, lassitude. HEENT:   No headaches,  Difficulty swallowing,  Tooth/dental problems,  Sore throat,                No sneezing, itching, ear ache, nasal congestion, post nasal drip,   CV:  No chest pain,  Orthopnea, PND, swelling in lower extremities, anasarca, dizziness, palpitations  GI  No heartburn, indigestion, abdominal pain, nausea, vomiting, diarrhea, change in bowel habits, loss of appetite  Resp: No shortness of breath with exertion or at rest.  No excess mucus, no productive cough,  No non-productive cough,  No coughing up of blood.  No change in color of mucus.  No wheezing.  Skin: no rash or lesions.  GU: no dysuria, change in color of urine, no urgency or frequency.  No flank pain.  MS:  No joint pain or swelling.  No decreased range of motion.  No back pain.  Psych:  No change in mood or affect. No depression or anxiety.  No memory loss.      Objective:   Physical Exam General- Alert, Oriented, Affect-appropriate, Distress- none acute  Overweight Skin- rash-none, lesions- none, excoriation- none Lymphadenopathy- none Head- atraumatic Eyes- Gross vision intact, PERRLA, conjunctivae clear, secretions Ears- Hearing, canals, Tm - normal Nose- Clear, No-Septal dev, mucus, polyps, erosion, perforation Mild external deviation Throat- Mallampati II , mucosa clear , drainage- none, tonsils- atrophic Neck- flexible , trachea midline, no stridor , thyroid nl, carotid no bruit   Thick neck Chest - symmetrical excursion , unlabored  Heart/CV- RRR , 2/6 precordial systolic murmur c/w AS , no gallop  , no rub, nl s1 s2  - JVD- none , edema- none, stasis changes- none, varices- none  Lung- clear to P&A, wheeze- none, cough- none , dullness-none, rub- none I don't hear crackles suggestive of asbestosis  Chest wall- Abd- tender-no, distended-no, bowel sounds-present, HSM- no Br/ Gen/ Rectal- Not done, not indicated Extrem- cyanosis- none, clubbing, none, atrophy- none, strength- nl Neuro- grossly intact to observation         Assessment & Plan:

## 2011-08-12 LAB — URINALYSIS, ROUTINE W REFLEX MICROSCOPIC
Bilirubin Urine: NEGATIVE
Nitrite: NEGATIVE
Specific Gravity, Urine: 1.012
Urobilinogen, UA: 0.2

## 2011-08-12 LAB — COMPREHENSIVE METABOLIC PANEL
Albumin: 4.2
BUN: 19
Calcium: 9.8
Creatinine, Ser: 1.02
Total Bilirubin: 1.8 — ABNORMAL HIGH
Total Protein: 6.9

## 2011-08-12 LAB — CBC
HCT: 44.8
MCV: 89
Platelets: 118 — ABNORMAL LOW
Platelets: 153
RDW: 13.1
WBC: 7.7

## 2011-08-12 LAB — BASIC METABOLIC PANEL
BUN: 10
Calcium: 8.2 — ABNORMAL LOW
Creatinine, Ser: 0.95
GFR calc non Af Amer: >60
Potassium: 3.3 — ABNORMAL LOW

## 2011-08-12 LAB — CK TOTAL AND CKMB (NOT AT ARMC)
CK, MB: 1.7
Relative Index: 1.3
Total CK: 130

## 2011-08-12 LAB — TYPE AND SCREEN: Antibody Screen: NEGATIVE

## 2011-08-12 LAB — URINE MICROSCOPIC-ADD ON

## 2011-08-12 LAB — ABO/RH: ABO/RH(D): O POS

## 2011-08-12 LAB — TROPONIN I: Troponin I: 0.02

## 2011-08-12 LAB — PROTIME-INR: INR: 1

## 2011-08-12 LAB — APTT: aPTT: 35

## 2011-08-20 ENCOUNTER — Other Ambulatory Visit: Payer: Self-pay | Admitting: Gastroenterology

## 2011-08-21 ENCOUNTER — Encounter: Payer: Self-pay | Admitting: Internal Medicine

## 2011-10-01 ENCOUNTER — Ambulatory Visit (INDEPENDENT_AMBULATORY_CARE_PROVIDER_SITE_OTHER): Payer: Medicare Other | Admitting: Cardiology

## 2011-10-01 ENCOUNTER — Encounter: Payer: Self-pay | Admitting: Cardiology

## 2011-10-01 DIAGNOSIS — I350 Nonrheumatic aortic (valve) stenosis: Secondary | ICD-10-CM

## 2011-10-01 DIAGNOSIS — E785 Hyperlipidemia, unspecified: Secondary | ICD-10-CM

## 2011-10-01 DIAGNOSIS — I2510 Atherosclerotic heart disease of native coronary artery without angina pectoris: Secondary | ICD-10-CM

## 2011-10-01 DIAGNOSIS — I359 Nonrheumatic aortic valve disorder, unspecified: Secondary | ICD-10-CM

## 2011-10-01 DIAGNOSIS — I779 Disorder of arteries and arterioles, unspecified: Secondary | ICD-10-CM

## 2011-10-01 NOTE — Patient Instructions (Signed)
Your physician wants you to follow-up in: 1 year with Dr McLean. (November 2013). You will receive a reminder letter in the mail two months in advance. If you don't receive a letter, please call our office to schedule the follow-up appointment.  

## 2011-10-02 ENCOUNTER — Encounter: Payer: Self-pay | Admitting: Cardiology

## 2011-10-02 DIAGNOSIS — E785 Hyperlipidemia, unspecified: Secondary | ICD-10-CM | POA: Insufficient documentation

## 2011-10-02 NOTE — Assessment & Plan Note (Signed)
Follow with Dr. Arbie Cookey.

## 2011-10-02 NOTE — Assessment & Plan Note (Signed)
Moderate by echo and at least moderate by exam.  No symptoms that would be referrable to severe AS.  Continue to monitor.

## 2011-10-02 NOTE — Assessment & Plan Note (Signed)
Stable with no ischemic symptoms.  Continue ASA, statin, ramipril.

## 2011-10-02 NOTE — Assessment & Plan Note (Signed)
Goal LDL < 70.  Will call PCP's office for copy of most recent labs.

## 2011-10-02 NOTE — Progress Notes (Signed)
PCP: Dr. Waynard Edwards  75 yo with history of CAD s/p CABG and moderate AS presents for cardiology followup.  He has been seen by Dr. Deborah Chalk in the past and is seen by me for the first time today.  He had CABG in 2003 and has not had a catheterization since.  Last Cardiolite in 6/11 showed no ischemia or infarction.  He has been followed by echo for aortic stenosis, which has been moderate.  He has been doing well from a cardiac standpoint.  No exertional dyspnea or chest pain.  He walks for exercise and has an exercise program that he participates in 3 times a week.  BP has been under reasonable control when he checks it at home.    ECG: NSR, normal  PMH: 1. HTN 2. Gout 3. OSA: CPAP 4. Prostate cancer s/p radiation. 5. CAD: CABG 2003 with LIMA-LAD, RIMA-RCA, seq SVG-ramus and distal CFX.  Cardiolite in 6/11 with no ischemia or infarction, EF 56%.  6. Moderate AS: Echo (5/12) with EF 55-60%, mild LVH, probably moderate AS.  7. Carotid stenosis: Right CEA in 4/09 (Early).  8. CCY in 2003  SH: Retired Medical illustrator (Production designer, theatre/television/film), married, nonsmoker.   FH: Noncontributory.   ROS: All systems reviewed and negative except as per HPI.   Current Outpatient Prescriptions  Medication Sig Dispense Refill  . allopurinol (ZYLOPRIM) 300 MG tablet Take 300 mg by mouth daily.        Marland Kitchen amLODipine (NORVASC) 2.5 MG tablet Take 2.5 mg by mouth daily.        Marland Kitchen aspirin 325 MG EC tablet Take 325 mg by mouth daily.        . Cholecalciferol (VITAMIN D3) 1000 UNITS CAPS Take by mouth daily.        . CRESTOR 20 MG tablet Take 0.5 tablets by mouth Daily.      . famotidine (PEPCID) 20 MG tablet Take 20 mg by mouth daily.        . hydrochlorothiazide 25 MG tablet Take 25 mg by mouth daily.        . metoprolol (LOPRESSOR) 50 MG tablet Take 50 mg by mouth 2 (two) times daily.        . Multiple Vitamins-Minerals (CENTRUM SILVER) tablet Take 1 tablet by mouth daily.        . Omega-3 Fatty Acids (FISH OIL) 1000 MG CAPS  Take by mouth 3 (three) times daily. 2 TABLETS TID       . ramipril (ALTACE) 10 MG tablet Take 10 mg by mouth daily.        . Tamsulosin HCl (FLOMAX) 0.4 MG CAPS Take 0.4 mg by mouth daily.          BP 132/78  Pulse 65  Ht 5\' 10"  (1.778 m)  Wt 110.224 kg (243 lb)  BMI 34.87 kg/m2 General: NAD Neck: No JVD, no thyromegaly or thyroid nodule.  Lungs: Clear to auscultation bilaterally with normal respiratory effort. CV: Nondisplaced PMI.  Heart regular S1/S2, no S3/S4, 3/6 crescendo-decrescendo murmur at RUSB with some obscuring of S2.  No peripheral edema.  No carotid bruit.  Normal pedal pulses.  Abdomen: Soft, nontender, no hepatosplenomegaly, no distention.  Neurologic: Alert and oriented x 3.  Psych: Normal affect. Extremities: No clubbing or cyanosis.

## 2011-11-20 DIAGNOSIS — I701 Atherosclerosis of renal artery: Secondary | ICD-10-CM | POA: Diagnosis not present

## 2011-11-20 DIAGNOSIS — I10 Essential (primary) hypertension: Secondary | ICD-10-CM | POA: Diagnosis not present

## 2011-11-24 DIAGNOSIS — R319 Hematuria, unspecified: Secondary | ICD-10-CM | POA: Diagnosis not present

## 2011-11-24 DIAGNOSIS — R339 Retention of urine, unspecified: Secondary | ICD-10-CM | POA: Diagnosis not present

## 2011-11-24 DIAGNOSIS — C61 Malignant neoplasm of prostate: Secondary | ICD-10-CM | POA: Diagnosis not present

## 2011-11-24 DIAGNOSIS — N3020 Other chronic cystitis without hematuria: Secondary | ICD-10-CM | POA: Diagnosis not present

## 2011-11-24 DIAGNOSIS — R300 Dysuria: Secondary | ICD-10-CM | POA: Diagnosis not present

## 2011-11-28 DIAGNOSIS — E782 Mixed hyperlipidemia: Secondary | ICD-10-CM | POA: Diagnosis not present

## 2011-11-28 DIAGNOSIS — I2510 Atherosclerotic heart disease of native coronary artery without angina pectoris: Secondary | ICD-10-CM | POA: Diagnosis not present

## 2011-11-28 DIAGNOSIS — G4733 Obstructive sleep apnea (adult) (pediatric): Secondary | ICD-10-CM | POA: Diagnosis not present

## 2011-11-28 DIAGNOSIS — I10 Essential (primary) hypertension: Secondary | ICD-10-CM | POA: Diagnosis not present

## 2011-12-15 DIAGNOSIS — C61 Malignant neoplasm of prostate: Secondary | ICD-10-CM | POA: Diagnosis not present

## 2011-12-15 DIAGNOSIS — R319 Hematuria, unspecified: Secondary | ICD-10-CM | POA: Diagnosis not present

## 2011-12-18 ENCOUNTER — Telehealth: Payer: Self-pay | Admitting: Cardiology

## 2011-12-18 NOTE — Telephone Encounter (Signed)
12,Echo,LOv faxed to [facility] @ [phone] 12/18/11/KM

## 2011-12-26 DIAGNOSIS — R011 Cardiac murmur, unspecified: Secondary | ICD-10-CM | POA: Diagnosis not present

## 2011-12-26 DIAGNOSIS — E785 Hyperlipidemia, unspecified: Secondary | ICD-10-CM | POA: Diagnosis not present

## 2011-12-26 DIAGNOSIS — I359 Nonrheumatic aortic valve disorder, unspecified: Secondary | ICD-10-CM | POA: Diagnosis not present

## 2011-12-26 DIAGNOSIS — IMO0002 Reserved for concepts with insufficient information to code with codable children: Secondary | ICD-10-CM | POA: Diagnosis not present

## 2011-12-26 DIAGNOSIS — G4733 Obstructive sleep apnea (adult) (pediatric): Secondary | ICD-10-CM | POA: Diagnosis not present

## 2011-12-26 DIAGNOSIS — R300 Dysuria: Secondary | ICD-10-CM | POA: Diagnosis not present

## 2011-12-26 DIAGNOSIS — I10 Essential (primary) hypertension: Secondary | ICD-10-CM | POA: Diagnosis not present

## 2011-12-26 DIAGNOSIS — R319 Hematuria, unspecified: Secondary | ICD-10-CM | POA: Diagnosis not present

## 2011-12-26 DIAGNOSIS — Z8546 Personal history of malignant neoplasm of prostate: Secondary | ICD-10-CM | POA: Diagnosis not present

## 2011-12-26 DIAGNOSIS — R339 Retention of urine, unspecified: Secondary | ICD-10-CM | POA: Diagnosis not present

## 2011-12-26 DIAGNOSIS — N3020 Other chronic cystitis without hematuria: Secondary | ICD-10-CM | POA: Diagnosis not present

## 2012-01-21 DIAGNOSIS — R300 Dysuria: Secondary | ICD-10-CM | POA: Diagnosis not present

## 2012-01-21 DIAGNOSIS — N35919 Unspecified urethral stricture, male, unspecified site: Secondary | ICD-10-CM | POA: Diagnosis not present

## 2012-01-21 DIAGNOSIS — C61 Malignant neoplasm of prostate: Secondary | ICD-10-CM | POA: Diagnosis not present

## 2012-03-07 DIAGNOSIS — L03039 Cellulitis of unspecified toe: Secondary | ICD-10-CM | POA: Diagnosis not present

## 2012-03-18 DIAGNOSIS — K625 Hemorrhage of anus and rectum: Secondary | ICD-10-CM | POA: Diagnosis not present

## 2012-03-19 DIAGNOSIS — K921 Melena: Secondary | ICD-10-CM | POA: Diagnosis not present

## 2012-03-25 ENCOUNTER — Ambulatory Visit (INDEPENDENT_AMBULATORY_CARE_PROVIDER_SITE_OTHER): Payer: Medicare Other | Admitting: *Deleted

## 2012-03-25 DIAGNOSIS — Z48812 Encounter for surgical aftercare following surgery on the circulatory system: Secondary | ICD-10-CM | POA: Diagnosis not present

## 2012-03-25 DIAGNOSIS — I6529 Occlusion and stenosis of unspecified carotid artery: Secondary | ICD-10-CM

## 2012-03-30 ENCOUNTER — Other Ambulatory Visit: Payer: Self-pay | Admitting: *Deleted

## 2012-03-30 DIAGNOSIS — I6529 Occlusion and stenosis of unspecified carotid artery: Secondary | ICD-10-CM

## 2012-03-30 DIAGNOSIS — Z48812 Encounter for surgical aftercare following surgery on the circulatory system: Secondary | ICD-10-CM

## 2012-03-31 ENCOUNTER — Encounter: Payer: Self-pay | Admitting: Vascular Surgery

## 2012-03-31 NOTE — Procedures (Unsigned)
CAROTID DUPLEX EXAM  INDICATION:  Followup right CEA.  HISTORY: Diabetes:  No Cardiac:  Yes Hypertension:  Yes Smoking:  Previous Previous Surgery:  Right CEA 02/2008 CV History: Amaurosis Fugax No, Paresthesias No, Hemiparesis No                                      RIGHT             LEFT Brachial systolic pressure:         116               116 Brachial Doppler waveforms:         WNL               WNL Vertebral direction of flow:        Antegrade         Antegrade DUPLEX VELOCITIES (cm/sec) CCA peak systolic                   82                87 ECA peak systolic                   134               112 ICA peak systolic                   81                79 ICA end diastolic                   26                21 PLAQUE MORPHOLOGY:                                    Heterogeneous PLAQUE AMOUNT:                      None              Mild PLAQUE LOCATION:                                      ICA  IMPRESSION: 1. Widely patent right CEA, without evidence of restenosis or     hyperplasia. 2. 1 to 39% left ICA plaquing. 3. Bilateral vertebral arteries are within normal limits.  ___________________________________________ Larina Earthly, M.D.  LT/MEDQ  D:  03/25/2012  T:  03/25/2012  Job:  [id number]

## 2012-04-19 ENCOUNTER — Encounter: Payer: Self-pay | Admitting: Cardiology

## 2012-04-19 DIAGNOSIS — M109 Gout, unspecified: Secondary | ICD-10-CM | POA: Diagnosis not present

## 2012-04-19 DIAGNOSIS — I10 Essential (primary) hypertension: Secondary | ICD-10-CM | POA: Diagnosis not present

## 2012-04-19 DIAGNOSIS — I2510 Atherosclerotic heart disease of native coronary artery without angina pectoris: Secondary | ICD-10-CM | POA: Diagnosis not present

## 2012-04-19 DIAGNOSIS — Z125 Encounter for screening for malignant neoplasm of prostate: Secondary | ICD-10-CM | POA: Diagnosis not present

## 2012-04-19 DIAGNOSIS — E785 Hyperlipidemia, unspecified: Secondary | ICD-10-CM | POA: Diagnosis not present

## 2012-04-19 DIAGNOSIS — R7301 Impaired fasting glucose: Secondary | ICD-10-CM | POA: Diagnosis not present

## 2012-04-26 DIAGNOSIS — R809 Proteinuria, unspecified: Secondary | ICD-10-CM | POA: Diagnosis not present

## 2012-04-26 DIAGNOSIS — E785 Hyperlipidemia, unspecified: Secondary | ICD-10-CM | POA: Diagnosis not present

## 2012-04-26 DIAGNOSIS — Z0000 Encounter for general adult medical examination without abnormal findings: Secondary | ICD-10-CM | POA: Diagnosis not present

## 2012-04-26 DIAGNOSIS — I2510 Atherosclerotic heart disease of native coronary artery without angina pectoris: Secondary | ICD-10-CM | POA: Diagnosis not present

## 2012-04-26 DIAGNOSIS — Z1212 Encounter for screening for malignant neoplasm of rectum: Secondary | ICD-10-CM | POA: Diagnosis not present

## 2012-04-27 ENCOUNTER — Ambulatory Visit: Payer: Medicare Other | Admitting: Internal Medicine

## 2012-05-24 DIAGNOSIS — I10 Essential (primary) hypertension: Secondary | ICD-10-CM | POA: Diagnosis not present

## 2012-05-24 DIAGNOSIS — I701 Atherosclerosis of renal artery: Secondary | ICD-10-CM | POA: Diagnosis not present

## 2012-05-25 DIAGNOSIS — R300 Dysuria: Secondary | ICD-10-CM | POA: Diagnosis not present

## 2012-05-25 DIAGNOSIS — C61 Malignant neoplasm of prostate: Secondary | ICD-10-CM | POA: Diagnosis not present

## 2012-05-25 DIAGNOSIS — N35919 Unspecified urethral stricture, male, unspecified site: Secondary | ICD-10-CM | POA: Diagnosis not present

## 2012-06-14 DIAGNOSIS — H2510 Age-related nuclear cataract, unspecified eye: Secondary | ICD-10-CM | POA: Diagnosis not present

## 2012-06-14 DIAGNOSIS — H31009 Unspecified chorioretinal scars, unspecified eye: Secondary | ICD-10-CM | POA: Diagnosis not present

## 2012-06-15 ENCOUNTER — Encounter: Payer: Self-pay | Admitting: Internal Medicine

## 2012-06-15 ENCOUNTER — Ambulatory Visit (INDEPENDENT_AMBULATORY_CARE_PROVIDER_SITE_OTHER): Payer: Medicare Other | Admitting: Internal Medicine

## 2012-06-15 VITALS — BP 122/62 | HR 52 | Ht 70.0 in | Wt 249.8 lb

## 2012-06-15 DIAGNOSIS — R918 Other nonspecific abnormal finding of lung field: Secondary | ICD-10-CM

## 2012-06-15 DIAGNOSIS — G4733 Obstructive sleep apnea (adult) (pediatric): Secondary | ICD-10-CM | POA: Diagnosis not present

## 2012-06-15 DIAGNOSIS — R911 Solitary pulmonary nodule: Secondary | ICD-10-CM | POA: Diagnosis not present

## 2012-06-15 NOTE — Patient Instructions (Addendum)
Order PPD skin test  Release of records signature, so we can get radiology report of CT chest you had today at [facility]  Continue CPAP 10/ [facility] Allegra  Order- schedule PFT  Dx lung nodules

## 2012-06-15 NOTE — Progress Notes (Signed)
   Subjective:    Patient ID: Andre Miranda, male    DOB: 03/24/1936, 76 y.o.   MRN: [id number]  HPI 04/25/11- 38 yoM former smoker for re-establishment for sleep apnea. He really likes his CPAP which is set at 9 as of last visit here in 2006. We discussed available treatments. Original NPSG is available and will be scanned. Medical issues of OSA reviewed.   06/16/11- 74 yoM former smoker followed for OSA complicated by hx CAD/PAD, aortic stenosis Continues CPAP 10 [employer]. He uses new machine all night every night, with nasal pillows mask.  Breathing is ok otherwise. Download chip gets picked up tomorrow. He missed only one night when he had some congestion.   06/15/12- 74 yoM former smoker followed for OSA complicated by hx CAD/PAD, aortic stenosis Wears CPAP every night for approx 6 hours; pressure working well for patient and no supplies needed at this time. CPAP 10/ [employer] - "loves it". He brings CT scan report from [facility] dated January 28. CT of the abdomen description of lower chest including small hiatal hernia, coronary artery and aortic valvular calcification with old sternotomy. Scattered calcified granulomas throughout the lungs He had smoked 2 packs per day for 15 years. CABG four-vessel 2003.  ROS-see HPI Constitutional:   No-   weight loss, night sweats, fevers, chills, fatigue, lassitude. HEENT:   No-  headaches, difficulty swallowing, tooth/dental problems, sore throat,       No-  sneezing, itching, ear ache, nasal congestion, post nasal drip,  CV:  No-   chest pain, orthopnea, PND, swelling in lower extremities, anasarca,   dizziness, palpitations Resp: No-   shortness of breath with exertion or at rest.              No-   productive cough,  No non-productive cough,  No- coughing up of blood.              No-   change in color of mucus.  No- wheezing.   Skin: No-   rash or lesions. GI:  No-   heartburn, indigestion, abdominal pain, nausea, vomiting,  GU:  MS:  No-   joint  pain or swelling.  . Neuro-     nothing unusual Psych:  No- change in mood or affect. No depression or anxiety.  No memory loss.  Objective:   Physical Exam General- Alert, Oriented, Affect-appropriate, Distress- none acute  Overweight Skin- rash-none, lesions- none, excoriation- none Lymphadenopathy- none Head- atraumatic Eyes- Gross vision intact, PERRLA, conjunctivae clear, secretions Ears- Hearing, canals, Tm - normal Nose- Clear, No-Septal dev, mucus, polyps, erosion, perforation Mild external deviation Throat- Mallampati II-III , mucosa clear , drainage- none, tonsils- atrophic Neck- flexible , trachea midline, no stridor , thyroid nl, carotid no bruit   Thick neck Chest - symmetrical excursion , unlabored  Heart/CV- RRR , +2/6 precordial systolic murmur c/w AS , no gallop  , no rub, nl s1 s2  - JVD- none , edema- none, stasis changes- none, varices- none  Lung- clear to P&A, wheeze- none, cough- none , dullness-none, rub- none   Chest wall- Abd- tender-no, distended-no, bowel sounds-present, HSM- no Br/ Gen/ Rectal- Not done, not indicated Extrem- cyanosis- none, clubbing, none, atrophy- none, strength- nl Neuro- grossly intact to observation  Assessment & Plan:

## 2012-06-16 ENCOUNTER — Telehealth: Payer: Self-pay | Admitting: Internal Medicine

## 2012-06-16 NOTE — Telephone Encounter (Signed)
Forward 7 pages from [facility] to Dr. Jetty Duhamel for review on 06-16-12 ym

## 2012-06-20 DIAGNOSIS — R918 Other nonspecific abnormal finding of lung field: Secondary | ICD-10-CM | POA: Insufficient documentation

## 2012-06-20 NOTE — Assessment & Plan Note (Signed)
Good compliance and control with CPAP 10 Weight loss would help but otherwise no changes are needed.

## 2012-06-20 NOTE — Assessment & Plan Note (Signed)
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 Plan-PPD placed for documentation.

## 2012-06-29 ENCOUNTER — Ambulatory Visit (INDEPENDENT_AMBULATORY_CARE_PROVIDER_SITE_OTHER): Payer: Medicare Other | Admitting: Internal Medicine

## 2012-06-29 DIAGNOSIS — R918 Other nonspecific abnormal finding of lung field: Secondary | ICD-10-CM

## 2012-06-29 DIAGNOSIS — K921 Melena: Secondary | ICD-10-CM | POA: Diagnosis not present

## 2012-06-29 LAB — PULMONARY FUNCTION TEST

## 2012-06-29 NOTE — Progress Notes (Signed)
PFT done today. 

## 2012-07-07 DIAGNOSIS — D129 Benign neoplasm of anus and anal canal: Secondary | ICD-10-CM | POA: Diagnosis not present

## 2012-07-07 DIAGNOSIS — D128 Benign neoplasm of rectum: Secondary | ICD-10-CM | POA: Diagnosis not present

## 2012-07-07 DIAGNOSIS — K921 Melena: Secondary | ICD-10-CM | POA: Diagnosis not present

## 2012-07-07 DIAGNOSIS — K626 Ulcer of anus and rectum: Secondary | ICD-10-CM | POA: Diagnosis not present

## 2012-07-26 DIAGNOSIS — C61 Malignant neoplasm of prostate: Secondary | ICD-10-CM | POA: Diagnosis not present

## 2012-07-26 DIAGNOSIS — N35919 Unspecified urethral stricture, male, unspecified site: Secondary | ICD-10-CM | POA: Diagnosis not present

## 2012-07-26 DIAGNOSIS — R300 Dysuria: Secondary | ICD-10-CM | POA: Diagnosis not present

## 2012-08-17 ENCOUNTER — Encounter: Payer: Self-pay | Admitting: Internal Medicine

## 2012-08-17 ENCOUNTER — Ambulatory Visit (INDEPENDENT_AMBULATORY_CARE_PROVIDER_SITE_OTHER): Payer: Medicare Other | Admitting: Internal Medicine

## 2012-08-17 VITALS — BP 126/80 | HR 58 | Ht 70.0 in | Wt 252.0 lb

## 2012-08-17 DIAGNOSIS — G4733 Obstructive sleep apnea (adult) (pediatric): Secondary | ICD-10-CM | POA: Diagnosis not present

## 2012-08-17 DIAGNOSIS — J209 Acute bronchitis, unspecified: Secondary | ICD-10-CM

## 2012-08-17 DIAGNOSIS — R918 Other nonspecific abnormal finding of lung field: Secondary | ICD-10-CM

## 2012-08-17 DIAGNOSIS — J45901 Unspecified asthma with (acute) exacerbation: Secondary | ICD-10-CM

## 2012-08-17 MED ORDER — LEVALBUTEROL HCL 0.63 MG/3ML IN NEBU
0.6300 mg | INHALATION_SOLUTION | Freq: Once | RESPIRATORY_TRACT | Status: AC
  Administered 2012-08-17: 0.63 mg via RESPIRATORY_TRACT

## 2012-08-17 NOTE — Progress Notes (Signed)
Subjective:    Patient ID: Andre Miranda, male    DOB: [birth year], 76 y.o.   MRN: [id number]  HPI 04/25/11- 27 yoM former smoker for re-establishment for sleep apnea. He really likes his CPAP which is set at 9 as of last visit here in 2006. We discussed available treatments. Original NPSG is available and will be scanned. Medical issues of OSA reviewed.   06/16/11- 74 yoM former smoker followed for OSA complicated by hx CAD/PAD, aortic stenosis Continues CPAP 10 [employer]. He uses new machine all night every night, with nasal pillows mask.  Breathing is ok otherwise. Download chip gets picked up tomorrow. He missed only one night when he had some congestion.   06/15/12- 74 yoM former smoker followed for OSA complicated by hx CAD/PAD, aortic stenosis Wears CPAP every night for approx 6 hours; pressure working well for patient and no supplies needed at this time. CPAP 10/ [employer] - "loves it". He brings CT scan report from [facility] dated January 28. CT of the abdomen description of lower chest including small hiatal hernia, coronary artery and aortic valvular calcification with old sternotomy. Scattered calcified granulomas throughout the lungs He had smoked 2 packs per day for 15 years. CABG four-vessel 2003.  08/17/12- 75 yoM former smoker followed for OSA complicated by hx CAD/PAD, aortic stenosis Wears CPAP 10/ [employer] every night for about 6-7 hours; review PFT results with patient. Lung nodule discussed-old granuloma based on CT scan. Recent cold treated by his primary physician and doing better. He will get his flu shot from his primary physician. PFT 06/29/2012-mild obstructive airways disease and small airways with response to bronchodilator. Normal lung volumes and diffusion. FEV13.10/106%, FEV1/FVC 0.79, FEF 25-75% 3.04/118% with 72% response to bronchodilator.  ROS-see HPI Constitutional:   No-   weight loss, night sweats, fevers, chills, fatigue, lassitude. HEENT:   No-  headaches,  difficulty swallowing, tooth/dental problems, sore throat,       No-  sneezing, itching, ear ache, nasal congestion, post nasal drip,  CV:  No-   chest pain, orthopnea, PND, swelling in lower extremities, anasarca,   dizziness, palpitations Resp: No-   shortness of breath with exertion or at rest.              No-   productive cough,  No non-productive cough,  No- coughing up of blood.              No-   change in color of mucus.  No- wheezing.   Skin: No-   rash or lesions. GI:  No-   heartburn, indigestion, abdominal pain, nausea, vomiting,  GU:  MS:  No-   joint pain or swelling.  . Neuro-     nothing unusual Psych:  No- change in mood or affect. No depression or anxiety.  No memory loss.  Objective:   Physical Exam General- Alert, Oriented, Affect-appropriate, Distress- none acute  Overweight Skin- rash-none, lesions- none, excoriation- none Lymphadenopathy- none Head- atraumatic Eyes- Gross vision intact, PERRLA, conjunctivae clear, secretions Ears- Hearing, canals, Tm - normal Nose- Clear, No-Septal dev, mucus, polyps, erosion, perforation Mild external deviation Throat- Mallampati II-III , mucosa clear , drainage- none, tonsils- atrophic Neck- flexible , trachea midline, no stridor , thyroid nl, carotid no bruit   Thick neck Chest - symmetrical excursion , unlabored  Heart/CV- RRR , +2/6 precordial systolic murmur c/w AS , no gallop  , no rub, nl s1 s2  - JVD- none , edema- none, stasis changes- none,  varices- none  Lung- clear to P&A, wheeze- none, cough-+ minor dry , dullness-none, rub- none   Chest wall- Abd-  Br/ Gen/ Rectal- Not done, not indicated Extrem- cyanosis- none, clubbing, none, atrophy- none, strength- nl Neuro- grossly intact to observation  Assessment & Plan:

## 2012-08-17 NOTE — Patient Instructions (Addendum)
Neb xop 0.63         Notice if this nebulizer treatment has any significant effect on your cough.   Ramipril might sometimes prolong your cough once started. Dr Waynard Edwards can try something else for a few weeks to see, if the cough persists.    We can continue CPAP 10/ [employer]  Please call as needed

## 2012-08-25 DIAGNOSIS — J209 Acute bronchitis, unspecified: Secondary | ICD-10-CM | POA: Insufficient documentation

## 2012-08-25 NOTE — Assessment & Plan Note (Addendum)
Discussed the impact of weight. Good compliance and control with CPAP

## 2012-08-25 NOTE — Assessment & Plan Note (Signed)
Plan-nebulizer treatment with Xopenex. Routine meds. Fluids. If clears well then return in one year. We discussed ramipril./ACE inhibitor as a potential component of cough.

## 2012-08-31 DIAGNOSIS — K626 Ulcer of anus and rectum: Secondary | ICD-10-CM | POA: Diagnosis not present

## 2012-08-31 DIAGNOSIS — K921 Melena: Secondary | ICD-10-CM | POA: Diagnosis not present

## 2012-08-31 DIAGNOSIS — C61 Malignant neoplasm of prostate: Secondary | ICD-10-CM | POA: Diagnosis not present

## 2012-08-31 DIAGNOSIS — D129 Benign neoplasm of anus and anal canal: Secondary | ICD-10-CM | POA: Diagnosis not present

## 2012-08-31 DIAGNOSIS — D128 Benign neoplasm of rectum: Secondary | ICD-10-CM | POA: Diagnosis not present

## 2012-10-07 DIAGNOSIS — Z23 Encounter for immunization: Secondary | ICD-10-CM | POA: Diagnosis not present

## 2012-10-28 DIAGNOSIS — K625 Hemorrhage of anus and rectum: Secondary | ICD-10-CM | POA: Diagnosis not present

## 2012-10-28 DIAGNOSIS — K626 Ulcer of anus and rectum: Secondary | ICD-10-CM | POA: Diagnosis not present

## 2012-11-23 ENCOUNTER — Ambulatory Visit (INDEPENDENT_AMBULATORY_CARE_PROVIDER_SITE_OTHER): Payer: Medicare Other | Admitting: Cardiology

## 2012-11-23 ENCOUNTER — Encounter: Payer: Self-pay | Admitting: Cardiology

## 2012-11-23 VITALS — BP 126/72 | HR 63 | Ht 70.0 in | Wt 251.0 lb

## 2012-11-23 DIAGNOSIS — I359 Nonrheumatic aortic valve disorder, unspecified: Secondary | ICD-10-CM

## 2012-11-23 DIAGNOSIS — I779 Disorder of arteries and arterioles, unspecified: Secondary | ICD-10-CM | POA: Diagnosis not present

## 2012-11-23 DIAGNOSIS — I2510 Atherosclerotic heart disease of native coronary artery without angina pectoris: Secondary | ICD-10-CM

## 2012-11-23 DIAGNOSIS — I350 Nonrheumatic aortic (valve) stenosis: Secondary | ICD-10-CM

## 2012-11-23 DIAGNOSIS — I10 Essential (primary) hypertension: Secondary | ICD-10-CM

## 2012-11-23 NOTE — Progress Notes (Addendum)
Patient ID: Andre Miranda, male   DOB: 1936/08/04, 77 y.o.   MRN: [id number] PCP: Dr. Waynard Edwards  77 yo with history of CAD s/p CABG and moderate AS presents for cardiology followup.  He had CABG in 2003 and has not had a catheterization since.  Last Cardiolite in 6/11 showed no ischemia or infarction.  He has been followed by echo for aortic stenosis, which has been moderate in the past.  He has been doing well from a cardiac standpoint.  No exertional dyspnea or chest pain.  No lightheadedness or syncope.  He has not been walking as much as in the past and weight is up 8 lbs.  BP has been under reasonable control when he checks it at home.    Main problem recently has been radiation proctitis with bleeding.  His aspirin was stopped for about 2 wks.  He is now back on ASA 81 mg (had been on 325 mg before) and seems to be tolerating this without recurrent bleeding.    ECG: NSR, normal  Labs (6/13): K 4.1, creatinine 1.0, LDL 64, HDL 44, TSH normal  PMH: 1. HTN 2. Gout 3. OSA: CPAP 4. Prostate cancer s/p radiation.  Has had radiation proctitis and cystitis with bleeding.  5. CAD: CABG 2003 with LIMA-LAD, RIMA-RCA, seq SVG-ramus and distal CFX.  Cardiolite in 6/11 with no ischemia or infarction, EF 56%.  6. Moderate AS: Echo (5/12) with EF 55-60%, mild LVH, probably moderate AS.  7. Carotid stenosis: Right CEA in 4/09 (Early).  8. CCY in 2003 9. Renal artery stenosis: No further data on this.  Apparently has been getting renal artery dopplers done at [facility] cardiology.   10. Lung nodules: Likely granulomas (stable).  11. PFTs (8/13): Mild obstructive airways disease.   SH: Retired Medical illustrator (Production designer, theatre/television/film), married, nonsmoker.   FH: Noncontributory.   ROS: All systems reviewed and negative except as per HPI.   Current Outpatient Prescriptions  Medication Sig Dispense Refill  . allopurinol (ZYLOPRIM) 300 MG tablet Take 300 mg by mouth daily.        Marland Kitchen amLODipine (NORVASC) 2.5 MG  tablet Take 2.5 mg by mouth daily.        Marland Kitchen CANASA 1000 MG suppository Once daily      . Cholecalciferol (VITAMIN D3) 1000 UNITS CAPS Take by mouth daily.        . CRESTOR 20 MG tablet Take 0.5 tablets by mouth Daily.      . famotidine (PEPCID) 20 MG tablet Take 20 mg by mouth daily.        . ferrous sulfate 325 (65 FE) MG tablet Take 325 mg by mouth daily with breakfast.      . hydrochlorothiazide 25 MG tablet Take 25 mg by mouth daily.        . metoprolol (LOPRESSOR) 50 MG tablet Take 50 mg by mouth 2 (two) times daily.        . Multiple Vitamins-Minerals (CENTRUM SILVER) tablet Take 1 tablet by mouth daily.        . Omega-3 Fatty Acids (FISH OIL) 1000 MG CAPS Take 2 capsules by mouth 2 (two) times daily.       . ramipril (ALTACE) 10 MG tablet Take 10 mg by mouth daily.        . Tamsulosin HCl (FLOMAX) 0.4 MG CAPS Take 0.4 mg by mouth daily.        Marland Kitchen aspirin EC 81 MG tablet Take 1 tablet (81 mg total) by  mouth daily.        BP 126/72  Pulse 63  Ht 5\' 10"  (1.778 m)  Wt 251 lb (113.853 kg)  BMI 36.01 kg/m2 General: NAD, obese Neck: No JVD, no thyromegaly or thyroid nodule.  Lungs: Clear to auscultation bilaterally with normal respiratory effort. CV: Nondisplaced PMI.  Heart regular S1/S2, no S3/S4, 3/6 crescendo-decrescendo murmur at RUSB with S2 heard clearly.  No peripheral edema.  Bilateral carotid bruits versus referred bruit from AS.  Normal pedal pulses.  Abdomen: Soft, nontender, no hepatosplenomegaly, no distention.  Neurologic: Alert and oriented x 3.  Psych: Normal affect. Extremities: No clubbing or cyanosis.   Assessment/Plan:  Aortic stenosis Moderate by last echo and does not sound severe by exam.  No symptoms that would be referrable to severe AS. I will get an echocardiogram to monitor for progression.  He should continue with statin and BP control.  Carotid arterial disease Follows with Dr. Arbie Cookey once a year.  CAD  Stable with no ischemic symptoms. Continue ASA,  statin, ramipril.  Hyperlipidemia Continue statin given hyperlipidemia.  Excellent lipids in 6/13.  He will have a copy faxed over here when PCP next checks his lipids.  Hypertension BP appears to be under good control.  He may have some degree of renal artery stenosis as he says that he has been sent to get yearly renal artery doppler studies at [facility] cardiology.  I will try to get hold of these records.    [name] needs more exercise, I have asked him to try to walk at least 4-5 times a week.  Weight is heading up.   Marca Ancona 11/23/2012 11:58 AM

## 2012-11-23 NOTE — Patient Instructions (Addendum)
Your physician has requested that you have an echocardiogram. Echocardiography is a painless test that uses sound waves to create images of your heart. It provides your doctor with information about the size and shape of your heart and how well your heart's chambers and valves are working. This procedure takes approximately one hour. There are no restrictions for this procedure.  Your physician wants you to follow-up in: 1 year with Dr McLean. (January 2015) .You will receive a reminder letter in the mail two months in advance. If you don't receive a letter, please call our office to schedule the follow-up appointment.  

## 2012-11-30 ENCOUNTER — Ambulatory Visit (HOSPITAL_COMMUNITY): Payer: Medicare Other | Attending: Cardiology | Admitting: Radiology

## 2012-11-30 DIAGNOSIS — I359 Nonrheumatic aortic valve disorder, unspecified: Secondary | ICD-10-CM | POA: Diagnosis not present

## 2012-11-30 DIAGNOSIS — I350 Nonrheumatic aortic (valve) stenosis: Secondary | ICD-10-CM

## 2012-11-30 DIAGNOSIS — I2510 Atherosclerotic heart disease of native coronary artery without angina pectoris: Secondary | ICD-10-CM | POA: Diagnosis not present

## 2012-11-30 DIAGNOSIS — I080 Rheumatic disorders of both mitral and aortic valves: Secondary | ICD-10-CM | POA: Diagnosis not present

## 2012-11-30 DIAGNOSIS — I10 Essential (primary) hypertension: Secondary | ICD-10-CM | POA: Insufficient documentation

## 2012-11-30 DIAGNOSIS — G4733 Obstructive sleep apnea (adult) (pediatric): Secondary | ICD-10-CM | POA: Insufficient documentation

## 2012-11-30 NOTE — Progress Notes (Signed)
Echocardiogram performed.  

## 2012-12-23 DIAGNOSIS — D500 Iron deficiency anemia secondary to blood loss (chronic): Secondary | ICD-10-CM | POA: Diagnosis not present

## 2012-12-23 DIAGNOSIS — K921 Melena: Secondary | ICD-10-CM | POA: Diagnosis not present

## 2012-12-23 DIAGNOSIS — D126 Benign neoplasm of colon, unspecified: Secondary | ICD-10-CM | POA: Diagnosis not present

## 2012-12-23 DIAGNOSIS — K626 Ulcer of anus and rectum: Secondary | ICD-10-CM | POA: Diagnosis not present

## 2012-12-23 DIAGNOSIS — C61 Malignant neoplasm of prostate: Secondary | ICD-10-CM | POA: Diagnosis not present

## 2012-12-28 DIAGNOSIS — R319 Hematuria, unspecified: Secondary | ICD-10-CM | POA: Diagnosis not present

## 2012-12-29 DIAGNOSIS — R319 Hematuria, unspecified: Secondary | ICD-10-CM | POA: Diagnosis not present

## 2013-01-24 DIAGNOSIS — N320 Bladder-neck obstruction: Secondary | ICD-10-CM | POA: Diagnosis not present

## 2013-02-16 ENCOUNTER — Encounter (INDEPENDENT_AMBULATORY_CARE_PROVIDER_SITE_OTHER): Payer: Medicare Other

## 2013-02-16 DIAGNOSIS — I700 Atherosclerosis of aorta: Secondary | ICD-10-CM | POA: Diagnosis not present

## 2013-03-21 ENCOUNTER — Ambulatory Visit: Payer: Medicare Other | Admitting: Neurosurgery

## 2013-03-21 ENCOUNTER — Other Ambulatory Visit: Payer: Medicare Other

## 2013-03-24 ENCOUNTER — Other Ambulatory Visit (INDEPENDENT_AMBULATORY_CARE_PROVIDER_SITE_OTHER): Payer: Medicare Other | Admitting: *Deleted

## 2013-03-24 ENCOUNTER — Ambulatory Visit: Payer: Medicare Other | Admitting: Neurosurgery

## 2013-03-24 DIAGNOSIS — Z48812 Encounter for surgical aftercare following surgery on the circulatory system: Secondary | ICD-10-CM | POA: Diagnosis not present

## 2013-03-24 DIAGNOSIS — I6529 Occlusion and stenosis of unspecified carotid artery: Secondary | ICD-10-CM | POA: Diagnosis not present

## 2013-03-25 ENCOUNTER — Other Ambulatory Visit: Payer: Self-pay

## 2013-03-25 DIAGNOSIS — Z48812 Encounter for surgical aftercare following surgery on the circulatory system: Secondary | ICD-10-CM

## 2013-03-28 ENCOUNTER — Encounter: Payer: Self-pay | Admitting: Vascular Surgery

## 2013-04-26 DIAGNOSIS — R7301 Impaired fasting glucose: Secondary | ICD-10-CM | POA: Diagnosis not present

## 2013-04-26 DIAGNOSIS — M109 Gout, unspecified: Secondary | ICD-10-CM | POA: Diagnosis not present

## 2013-04-26 DIAGNOSIS — E785 Hyperlipidemia, unspecified: Secondary | ICD-10-CM | POA: Diagnosis not present

## 2013-04-26 DIAGNOSIS — I2510 Atherosclerotic heart disease of native coronary artery without angina pectoris: Secondary | ICD-10-CM | POA: Diagnosis not present

## 2013-04-26 DIAGNOSIS — I10 Essential (primary) hypertension: Secondary | ICD-10-CM | POA: Diagnosis not present

## 2013-04-26 DIAGNOSIS — Z125 Encounter for screening for malignant neoplasm of prostate: Secondary | ICD-10-CM | POA: Diagnosis not present

## 2013-05-03 DIAGNOSIS — Z1331 Encounter for screening for depression: Secondary | ICD-10-CM | POA: Diagnosis not present

## 2013-05-03 DIAGNOSIS — E785 Hyperlipidemia, unspecified: Secondary | ICD-10-CM | POA: Diagnosis not present

## 2013-05-03 DIAGNOSIS — C61 Malignant neoplasm of prostate: Secondary | ICD-10-CM | POA: Diagnosis not present

## 2013-05-03 DIAGNOSIS — R809 Proteinuria, unspecified: Secondary | ICD-10-CM | POA: Diagnosis not present

## 2013-05-03 DIAGNOSIS — G4730 Sleep apnea, unspecified: Secondary | ICD-10-CM | POA: Diagnosis not present

## 2013-05-03 DIAGNOSIS — M545 Low back pain: Secondary | ICD-10-CM | POA: Diagnosis not present

## 2013-05-03 DIAGNOSIS — R7301 Impaired fasting glucose: Secondary | ICD-10-CM | POA: Diagnosis not present

## 2013-05-03 DIAGNOSIS — E669 Obesity, unspecified: Secondary | ICD-10-CM | POA: Diagnosis not present

## 2013-05-03 DIAGNOSIS — Z23 Encounter for immunization: Secondary | ICD-10-CM | POA: Diagnosis not present

## 2013-05-03 DIAGNOSIS — Z0000 Encounter for general adult medical examination without abnormal findings: Secondary | ICD-10-CM | POA: Diagnosis not present

## 2013-05-04 DIAGNOSIS — Z1212 Encounter for screening for malignant neoplasm of rectum: Secondary | ICD-10-CM | POA: Diagnosis not present

## 2013-05-05 DIAGNOSIS — K921 Melena: Secondary | ICD-10-CM | POA: Diagnosis not present

## 2013-05-05 DIAGNOSIS — K626 Ulcer of anus and rectum: Secondary | ICD-10-CM | POA: Diagnosis not present

## 2013-05-05 DIAGNOSIS — Z8546 Personal history of malignant neoplasm of prostate: Secondary | ICD-10-CM | POA: Diagnosis not present

## 2013-05-05 DIAGNOSIS — D126 Benign neoplasm of colon, unspecified: Secondary | ICD-10-CM | POA: Diagnosis not present

## 2013-06-02 DIAGNOSIS — Z86010 Personal history of colonic polyps: Secondary | ICD-10-CM | POA: Diagnosis not present

## 2013-06-02 DIAGNOSIS — D126 Benign neoplasm of colon, unspecified: Secondary | ICD-10-CM | POA: Diagnosis not present

## 2013-06-02 DIAGNOSIS — D128 Benign neoplasm of rectum: Secondary | ICD-10-CM | POA: Diagnosis not present

## 2013-06-02 DIAGNOSIS — Z09 Encounter for follow-up examination after completed treatment for conditions other than malignant neoplasm: Secondary | ICD-10-CM | POA: Diagnosis not present

## 2013-06-02 DIAGNOSIS — T66XXXS Radiation sickness, unspecified, sequela: Secondary | ICD-10-CM | POA: Diagnosis not present

## 2013-06-02 DIAGNOSIS — K5730 Diverticulosis of large intestine without perforation or abscess without bleeding: Secondary | ICD-10-CM | POA: Diagnosis not present

## 2013-06-20 DIAGNOSIS — I2510 Atherosclerotic heart disease of native coronary artery without angina pectoris: Secondary | ICD-10-CM | POA: Diagnosis not present

## 2013-06-20 DIAGNOSIS — R918 Other nonspecific abnormal finding of lung field: Secondary | ICD-10-CM | POA: Diagnosis not present

## 2013-06-23 ENCOUNTER — Telehealth: Payer: Self-pay | Admitting: Internal Medicine

## 2013-06-27 NOTE — Telephone Encounter (Signed)
Please advise status of this thanks

## 2013-06-27 NOTE — Telephone Encounter (Signed)
LMTCB x 1 

## 2013-06-27 NOTE — Telephone Encounter (Signed)
He brought report of CT chest done for his Urologist. It shows small nodules that appear to be stable and are probably benign. We can discuss at his next ov in October.

## 2013-06-28 NOTE — Telephone Encounter (Signed)
I spoke with patient about results and he verbalized understanding and had no questions 

## 2013-07-05 ENCOUNTER — Encounter: Payer: Self-pay | Admitting: Internal Medicine

## 2013-07-23 ENCOUNTER — Encounter: Payer: Self-pay | Admitting: Cardiology

## 2013-07-25 ENCOUNTER — Other Ambulatory Visit: Payer: Self-pay

## 2013-07-28 DIAGNOSIS — H2510 Age-related nuclear cataract, unspecified eye: Secondary | ICD-10-CM | POA: Diagnosis not present

## 2013-07-28 DIAGNOSIS — H31009 Unspecified chorioretinal scars, unspecified eye: Secondary | ICD-10-CM | POA: Diagnosis not present

## 2013-08-01 DIAGNOSIS — N35919 Unspecified urethral stricture, male, unspecified site: Secondary | ICD-10-CM | POA: Diagnosis not present

## 2013-08-01 DIAGNOSIS — N320 Bladder-neck obstruction: Secondary | ICD-10-CM | POA: Diagnosis not present

## 2013-08-01 DIAGNOSIS — R351 Nocturia: Secondary | ICD-10-CM | POA: Diagnosis not present

## 2013-08-01 DIAGNOSIS — C61 Malignant neoplasm of prostate: Secondary | ICD-10-CM | POA: Diagnosis not present

## 2013-08-10 ENCOUNTER — Encounter: Payer: Self-pay | Admitting: Cardiology

## 2013-08-18 ENCOUNTER — Ambulatory Visit (INDEPENDENT_AMBULATORY_CARE_PROVIDER_SITE_OTHER): Payer: Medicare Other | Admitting: Internal Medicine

## 2013-08-18 ENCOUNTER — Encounter: Payer: Self-pay | Admitting: Internal Medicine

## 2013-08-18 VITALS — BP 130/82 | HR 56 | Ht 70.0 in | Wt 252.4 lb

## 2013-08-18 DIAGNOSIS — Z23 Encounter for immunization: Secondary | ICD-10-CM | POA: Diagnosis not present

## 2013-08-18 DIAGNOSIS — R918 Other nonspecific abnormal finding of lung field: Secondary | ICD-10-CM | POA: Diagnosis not present

## 2013-08-18 DIAGNOSIS — G4733 Obstructive sleep apnea (adult) (pediatric): Secondary | ICD-10-CM

## 2013-08-18 NOTE — Patient Instructions (Signed)
We can continue CPAP 10/ [facility]  Flu  vax  Please call as needed 

## 2013-08-18 NOTE — Progress Notes (Signed)
Subjective:    Patient ID: Andre Miranda, male    DOB: 01-24-1936, 77 y.o.   MRN: [id number]  HPI 04/25/11- 37 yoM former smoker for re-establishment for sleep apnea. He really likes his CPAP which is set at 9 as of last visit here in 2006. We discussed available treatments. Original NPSG is available and will be scanned. Medical issues of OSA reviewed.   06/16/11- 74 yoM former smoker followed for OSA complicated by hx CAD/PAD, aortic stenosis Continues CPAP 10 [facility]. He uses new machine all night every night, with nasal pillows mask.  Breathing is ok otherwise. Download chip gets picked up tomorrow. He missed only one night when he had some congestion.   06/15/12- 74 yoM former smoker followed for OSA complicated by hx CAD/PAD, aortic stenosis Wears CPAP every night for approx 6 hours; pressure working well for patient and no supplies needed at this time. CPAP 10/ [facility] - "loves it". He brings CT scan [facility] January 28. CT of the abdomen description of lower chest including small hiatal hernia, coronary artery and aortic valvular calcification with old sternotomy. Scattered calcified granulomas throughout the lungs He had smoked 2 packs per day for 15 years. CABG four-vessel 2003.  08/17/12- 75 yoM former smoker followed for OSA complicated by hx CAD/PAD, aortic stenosis Wears CPAP 10/ [facility] every night for about 6-7 hours; review PFT results with patient. Lung nodule discussed-old granuloma based on CT scan. Recent cold treated by his primary physician and doing better. He will get his flu shot from his primary physician. PFT 06/29/2012-mild obstructive airways disease and small airways with response to bronchodilator. Normal lung volumes and diffusion. FEV13.10/106%, FEV1/FVC 0.79, FEF 25-75% 3.04/118% with 72% response to bronchodilator.  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 Previous cough resolved. He has no acute concerns but mentions back ache.  [facility] is watching is stable lung nodule on CT   ROS-see HPI Constitutional:   No-   weight loss, night sweats, fevers, chills, fatigue, lassitude. HEENT:   No-  headaches, difficulty swallowing, tooth/dental problems, sore throat,       No-  sneezing, itching, ear ache, nasal congestion, post nasal drip,  CV:  No-   chest pain, orthopnea, PND, swelling in lower extremities, anasarca,   dizziness, palpitations Resp: No-   shortness of breath with exertion or at rest.              No-   productive cough,  No non-productive cough,  No- coughing up of blood.              No-   change in color of mucus.  No- wheezing.   Skin: No-   rash or lesions. GI:  No-   heartburn, indigestion, abdominal pain, nausea, vomiting,  GU:  MS:  No-   joint pain or swelling.  . Neuro-     nothing unusual Psych:  No- change in mood or affect. No depression or anxiety.  No memory loss.  Objective:   Physical Exam General- Alert, Oriented, Affect-appropriate, Distress- none acute  Overweight Skin- rash-none, lesions- none, excoriation- none Lymphadenopathy- none Head- atraumatic Eyes- Gross vision intact, PERRLA, conjunctivae clear, secretions Ears- Hearing, canals, Tm - normal Nose- Clear, No-Septal dev, mucus, polyps, erosion, perforation Mild  external deviation Throat- Mallampati II-III , mucosa clear , drainage- none, tonsils- atrophic Neck- flexible , trachea midline, no stridor , thyroid nl, carotid no bruit   Thick neck Chest - symmetrical excursion , unlabored  Heart/CV- RRR , +2/6 precordial systolic murmur c/w AS , no gallop  , no rub, nl s1 s2  - JVD- none , edema- none, stasis changes- none, varices- none  Lung- clear to P&A, wheeze- none, cough-+ minor dry , dullness-none, rub- none   Chest wall- Abd-  Br/  Gen/ Rectal- Not done, not indicated Extrem- cyanosis- none, clubbing, none, atrophy- none, strength- nl Neuro- grossly intact to observation  Assessment & Plan:

## 2013-08-28 NOTE — Assessment & Plan Note (Signed)
Good compliance and control. Weight loss would help as discussed

## 2013-08-28 NOTE — Assessment & Plan Note (Signed)
He says Andre Miranda continues to follow him for a nodule which has been stable so far

## 2013-11-24 ENCOUNTER — Encounter: Payer: Self-pay | Admitting: *Deleted

## 2013-11-24 ENCOUNTER — Encounter: Payer: Self-pay | Admitting: Cardiology

## 2013-11-24 ENCOUNTER — Ambulatory Visit (INDEPENDENT_AMBULATORY_CARE_PROVIDER_SITE_OTHER): Payer: Medicare Other | Admitting: Cardiology

## 2013-11-24 VITALS — BP 148/84 | HR 58 | Ht 70.0 in | Wt 254.0 lb

## 2013-11-24 DIAGNOSIS — I350 Nonrheumatic aortic (valve) stenosis: Secondary | ICD-10-CM

## 2013-11-24 DIAGNOSIS — I2510 Atherosclerotic heart disease of native coronary artery without angina pectoris: Secondary | ICD-10-CM | POA: Diagnosis not present

## 2013-11-24 DIAGNOSIS — I739 Peripheral vascular disease, unspecified: Secondary | ICD-10-CM

## 2013-11-24 DIAGNOSIS — I779 Disorder of arteries and arterioles, unspecified: Secondary | ICD-10-CM

## 2013-11-24 DIAGNOSIS — E785 Hyperlipidemia, unspecified: Secondary | ICD-10-CM

## 2013-11-24 DIAGNOSIS — I359 Nonrheumatic aortic valve disorder, unspecified: Secondary | ICD-10-CM | POA: Diagnosis not present

## 2013-11-24 DIAGNOSIS — I10 Essential (primary) hypertension: Secondary | ICD-10-CM

## 2013-11-25 NOTE — Progress Notes (Signed)
Patient ID: Andre Miranda, male   DOB: Sep 02, 1936, 78 y.o.   MRN: [id number] PCP: Dr. Joylene Draft  78 yo with history of CAD s/p CABG and moderate AS presents for cardiology followup.  He had CABG in 2003 and has not had a catheterization since.  Last Cardiolite in 6/11 showed no ischemia or infarction.  He has been followed by echo for aortic stenosis, which has been moderate in the past.  He has been doing well from a cardiac standpoint.  No exertional dyspnea or chest pain.  No lightheadedness or syncope.  Weight is up another 3 lbs. BP has been under reasonable control when he checks it at home (average 130/70).    ECG: NSR, 1st degree AV block, IVCD  Labs (6/13): K 4.1, creatinine 1.0, LDL 64, HDL 44, TSH normal  PMH: 1. HTN 2. Gout 3. OSA: CPAP 4. Prostate cancer s/p radiation.  Has had radiation proctitis and cystitis with bleeding.  5. CAD: CABG 2003 with LIMA-LAD, RIMA-RCA, seq SVG-ramus and distal CFX.  Cardiolite in 6/11 with no ischemia or infarction, EF 56%.  6. Moderate AS: Echo (5/12) with EF 55-60%, mild LVH, probably moderate AS.  Echo (1/14) with EF 55-60%, moderate LVH, moderate AS with mean gradient 33 mmHg, peak 53 mmHg, RV mildly dilated.  7. Carotid stenosis: Right CEA in 4/09 (Early).  Carotid dopplers (5/14) with patent R CEA, < 34% LICA stenosis.  8. CCY in 2003 9. Renal artery stenosis: Renal artery duplex (4/14) with 1-59% bilateral proximal RAS.   10. Lung nodules: Likely granulomas (stable).  11. PFTs (8/13): Mild obstructive airways disease.   SH: Retired Hotel manager (Retail banker), married, nonsmoker.   FH: Noncontributory.   Current Outpatient Prescriptions  Medication Sig Dispense Refill  . allopurinol (ZYLOPRIM) 300 MG tablet Take 300 mg by mouth daily.        Marland Kitchen amLODipine (NORVASC) 2.5 MG tablet Take 2.5 mg by mouth daily.        Marland Kitchen aspirin EC 81 MG tablet Take 1 tablet (81 mg total) by mouth daily.      Marland Kitchen CANASA 1000 MG suppository Once daily prn       . Cholecalciferol (VITAMIN D3) 1000 UNITS CAPS Take by mouth daily.        . CRESTOR 20 MG tablet Take 0.5 tablets by mouth Daily.      . famotidine (PEPCID) 20 MG tablet Take 20 mg by mouth daily.        . hydrochlorothiazide 25 MG tablet Take 25 mg by mouth daily.        . metoprolol (LOPRESSOR) 50 MG tablet Take 50 mg by mouth 2 (two) times daily.        . Multiple Vitamins-Minerals (CENTRUM SILVER) tablet Take 1 tablet by mouth daily.        . Omega-3 Fatty Acids (FISH OIL) 1000 MG CAPS Take 1 capsule by mouth 2 (two) times daily.       . polyethylene glycol (MIRALAX / GLYCOLAX) packet Take 17 g by mouth daily as needed.      . ramipril (ALTACE) 10 MG tablet Take 10 mg by mouth daily.        . Tamsulosin HCl (FLOMAX) 0.4 MG CAPS Take 0.4 mg by mouth daily.         No current facility-administered medications for this visit.    BP 148/84  Pulse 58  Ht 5\' 10"  (1.778 m)  Wt 115.214 kg (254 lb)  BMI 36.45  kg/m2 General: NAD, obese Neck: No JVD, no thyromegaly or thyroid nodule.  Lungs: Clear to auscultation bilaterally with normal respiratory effort. CV: Nondisplaced PMI.  Heart regular S1/S2, no S3/S4, 3/6 crescendo-decrescendo murmur at RUSB with S2 heard clearly.  No peripheral edema.  Bilateral carotid bruits versus referred bruit from AS.  Normal pedal pulses.  Abdomen: Soft, nontender, no hepatosplenomegaly, no distention.  Neurologic: Alert and oriented x 3.  Psych: Normal affect. Extremities: No clubbing or cyanosis.   Assessment/Plan:  Aortic stenosis Stable.  Moderate by last echo and does not sound severe by exam.  No symptoms that would be referrable to severe AS. As long as concerning symptoms do not develop, I will repeat an echo in 2 years.  Carotid arterial disease Follows with Dr. Donnetta Hutching once a year.  CAD  Stable with no ischemic symptoms. Continue ASA, statin, ramipril.  Hyperlipidemia Continue statin given hyperlipidemia.  I will call PCP for most recent  lipids. Hypertension BP appears to be under good control when he checks at home.  Loralie Champagne 11/25/2013 10:57 PM

## 2013-12-01 ENCOUNTER — Telehealth: Payer: Self-pay | Admitting: Cardiology

## 2013-12-01 NOTE — Telephone Encounter (Signed)
Pt's wife advised per Dr Claris Gladden 11/24/13 note pt does not need echo until 11/2014 unless change in symptoms.

## 2013-12-01 NOTE — Telephone Encounter (Signed)
New Message  Pt called states that he was having an ECHO completed every year and he is asking if he can have his ECHO completed earlier.... Please call.

## 2013-12-06 ENCOUNTER — Telehealth: Payer: Self-pay | Admitting: Internal Medicine

## 2013-12-06 NOTE — Telephone Encounter (Signed)
Spoke with pt. He reports he ordered equipment from [facility] about 10 days ago.  He reports was told he would get this in about 10 days. This was last week.  He then received a 10 page report about billing, etc. He reports he does not have time to read this. The bill he received was over $1000.  I advised him he needed to contact [facility] regarding this. He will do so. Nothing further needed

## 2014-01-02 ENCOUNTER — Other Ambulatory Visit: Payer: Self-pay | Admitting: Vascular Surgery

## 2014-01-02 DIAGNOSIS — Z48812 Encounter for surgical aftercare following surgery on the circulatory system: Secondary | ICD-10-CM

## 2014-01-02 DIAGNOSIS — I6529 Occlusion and stenosis of unspecified carotid artery: Secondary | ICD-10-CM

## 2014-02-13 DIAGNOSIS — R300 Dysuria: Secondary | ICD-10-CM | POA: Diagnosis not present

## 2014-02-13 DIAGNOSIS — R3911 Hesitancy of micturition: Secondary | ICD-10-CM | POA: Diagnosis not present

## 2014-02-13 DIAGNOSIS — Z7982 Long term (current) use of aspirin: Secondary | ICD-10-CM | POA: Diagnosis not present

## 2014-02-13 DIAGNOSIS — Z87891 Personal history of nicotine dependence: Secondary | ICD-10-CM | POA: Diagnosis not present

## 2014-02-13 DIAGNOSIS — C61 Malignant neoplasm of prostate: Secondary | ICD-10-CM | POA: Diagnosis not present

## 2014-02-13 DIAGNOSIS — Z87448 Personal history of other diseases of urinary system: Secondary | ICD-10-CM | POA: Diagnosis not present

## 2014-02-13 DIAGNOSIS — R319 Hematuria, unspecified: Secondary | ICD-10-CM | POA: Diagnosis not present

## 2014-02-13 DIAGNOSIS — R011 Cardiac murmur, unspecified: Secondary | ICD-10-CM | POA: Diagnosis not present

## 2014-02-13 DIAGNOSIS — N35919 Unspecified urethral stricture, male, unspecified site: Secondary | ICD-10-CM | POA: Diagnosis not present

## 2014-02-13 DIAGNOSIS — Z8709 Personal history of other diseases of the respiratory system: Secondary | ICD-10-CM | POA: Diagnosis not present

## 2014-02-13 DIAGNOSIS — Z8679 Personal history of other diseases of the circulatory system: Secondary | ICD-10-CM | POA: Diagnosis not present

## 2014-03-27 ENCOUNTER — Encounter: Payer: Self-pay | Admitting: Vascular Surgery

## 2014-03-28 ENCOUNTER — Ambulatory Visit (HOSPITAL_COMMUNITY)
Admission: RE | Admit: 2014-03-28 | Discharge: 2014-03-28 | Disposition: A | Discharge status: Home / Self Care | Payer: Medicare Other | Source: Ambulatory Visit | Attending: Vascular Surgery | Admitting: Vascular Surgery

## 2014-03-28 ENCOUNTER — Encounter: Payer: Self-pay | Admitting: Vascular Surgery

## 2014-03-28 ENCOUNTER — Ambulatory Visit (INDEPENDENT_AMBULATORY_CARE_PROVIDER_SITE_OTHER): Payer: Medicare Other | Admitting: Vascular Surgery

## 2014-03-28 VITALS — BP 144/71 | HR 50 | Resp 16 | Ht 71.0 in | Wt 252.0 lb

## 2014-03-28 DIAGNOSIS — I6529 Occlusion and stenosis of unspecified carotid artery: Secondary | ICD-10-CM

## 2014-03-28 DIAGNOSIS — Z48812 Encounter for surgical aftercare following surgery on the circulatory system: Secondary | ICD-10-CM
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
## 2014-03-29 ENCOUNTER — Encounter: Payer: Self-pay | Admitting: Vascular Surgery

## 2014-03-29 ENCOUNTER — Ambulatory Visit (HOSPITAL_COMMUNITY): Admission: RE | Admit: 2014-03-29 | Payer: Medicare Other | Source: Ambulatory Visit

## 2014-04-27 DIAGNOSIS — C61 Malignant neoplasm of prostate: Secondary | ICD-10-CM | POA: Diagnosis not present

## 2014-04-27 DIAGNOSIS — IMO0002 Reserved for concepts with insufficient information to code with codable children: Secondary | ICD-10-CM | POA: Diagnosis not present

## 2014-05-26 DIAGNOSIS — R7301 Impaired fasting glucose: Secondary | ICD-10-CM | POA: Diagnosis not present

## 2014-05-26 DIAGNOSIS — E785 Hyperlipidemia, unspecified: Secondary | ICD-10-CM | POA: Diagnosis not present

## 2014-05-26 DIAGNOSIS — I2510 Atherosclerotic heart disease of native coronary artery without angina pectoris: Secondary | ICD-10-CM | POA: Diagnosis not present

## 2014-05-26 DIAGNOSIS — I10 Essential (primary) hypertension: Secondary | ICD-10-CM | POA: Diagnosis not present

## 2014-05-26 DIAGNOSIS — M109 Gout, unspecified: Secondary | ICD-10-CM | POA: Diagnosis not present

## 2014-06-06 DIAGNOSIS — M109 Gout, unspecified: Secondary | ICD-10-CM | POA: Diagnosis not present

## 2014-06-06 DIAGNOSIS — R7301 Impaired fasting glucose: Secondary | ICD-10-CM | POA: Diagnosis not present

## 2014-06-06 DIAGNOSIS — E669 Obesity, unspecified: Secondary | ICD-10-CM | POA: Diagnosis not present

## 2014-06-06 DIAGNOSIS — I2510 Atherosclerotic heart disease of native coronary artery without angina pectoris: Secondary | ICD-10-CM | POA: Diagnosis not present

## 2014-06-06 DIAGNOSIS — Z0000 Encounter for general adult medical examination without abnormal findings: Secondary | ICD-10-CM | POA: Diagnosis not present

## 2014-06-06 DIAGNOSIS — R809 Proteinuria, unspecified: Secondary | ICD-10-CM | POA: Diagnosis not present

## 2014-06-06 DIAGNOSIS — Z1331 Encounter for screening for depression: Secondary | ICD-10-CM | POA: Diagnosis not present

## 2014-06-06 DIAGNOSIS — G4730 Sleep apnea, unspecified: Secondary | ICD-10-CM | POA: Diagnosis not present

## 2014-06-06 DIAGNOSIS — E785 Hyperlipidemia, unspecified: Secondary | ICD-10-CM | POA: Diagnosis not present

## 2014-08-18 ENCOUNTER — Ambulatory Visit: Payer: Medicare Other | Admitting: Internal Medicine

## 2014-09-11 DIAGNOSIS — H25013 Cortical age-related cataract, bilateral: Secondary | ICD-10-CM | POA: Diagnosis not present

## 2014-09-11 DIAGNOSIS — H31092 Other chorioretinal scars, left eye: Secondary | ICD-10-CM | POA: Diagnosis not present

## 2014-09-11 DIAGNOSIS — H2513 Age-related nuclear cataract, bilateral: Secondary | ICD-10-CM | POA: Diagnosis not present

## 2014-09-18 DIAGNOSIS — Z23 Encounter for immunization: Secondary | ICD-10-CM | POA: Diagnosis not present

## 2014-09-20 ENCOUNTER — Other Ambulatory Visit: Payer: Self-pay | Admitting: *Deleted

## 2014-09-28 ENCOUNTER — Encounter: Payer: Self-pay | Admitting: Internal Medicine

## 2014-09-28 ENCOUNTER — Ambulatory Visit (INDEPENDENT_AMBULATORY_CARE_PROVIDER_SITE_OTHER)
Admission: RE | Admit: 2014-09-28 | Discharge: 2014-09-28 | Disposition: A | Discharge status: Home / Self Care | Payer: Medicare Other | Source: Ambulatory Visit | Attending: Internal Medicine | Admitting: Internal Medicine

## 2014-09-28 ENCOUNTER — Ambulatory Visit (INDEPENDENT_AMBULATORY_CARE_PROVIDER_SITE_OTHER): Payer: Medicare Other | Admitting: Internal Medicine

## 2014-09-28 VITALS — BP 98/62 | HR 60 | Ht 71.0 in | Wt 255.8 lb

## 2014-09-28 DIAGNOSIS — G4733 Obstructive sleep apnea (adult) (pediatric): Secondary | ICD-10-CM

## 2014-09-28 DIAGNOSIS — R918 Other nonspecific abnormal finding of lung field: Secondary | ICD-10-CM

## 2014-09-28 DIAGNOSIS — I6529 Occlusion and stenosis of unspecified carotid artery: Secondary | ICD-10-CM | POA: Diagnosis not present

## 2014-09-28 DIAGNOSIS — R0789 Other chest pain: Secondary | ICD-10-CM

## 2014-09-28 DIAGNOSIS — I350 Nonrheumatic aortic (valve) stenosis: Secondary | ICD-10-CM | POA: Diagnosis not present

## 2014-09-28 NOTE — Progress Notes (Signed)
Subjective:    Patient ID: Andre Miranda, male    DOB: March 09, 1936, 78 y.o.   MRN: [id number]  HPI 04/25/11- 34 yoM former smoker for re-establishment for sleep apnea. He really likes his CPAP which is set at 9 as of last visit here in 2006. We discussed available treatments. Original NPSG is available and will be scanned. Medical issues of OSA reviewed.   06/16/11- 74 yoM former smoker followed for OSA complicated by hx CAD/PAD, aortic stenosis Continues CPAP 10 [employer]. He uses new machine all night every night, with nasal pillows mask.  Breathing is ok otherwise. Download chip gets picked up tomorrow. He missed only one night when he had some congestion.   06/15/12- 74 yoM former smoker followed for OSA complicated by hx CAD/PAD, aortic stenosis Wears CPAP every night for approx 6 hours; pressure working well for patient and no supplies needed at this time. CPAP 10/ [employer] - "loves it". He brings CT scan report from [facility] dated January 28. CT of the abdomen description of lower chest including small hiatal hernia, coronary artery and aortic valvular calcification with old sternotomy. Scattered calcified granulomas throughout the lungs He had smoked 2 packs per day for 15 years. CABG four-vessel 2003.  08/17/12- 75 yoM former smoker followed for OSA complicated by hx CAD/PAD, aortic stenosis Wears CPAP 10/ [employer] every night for about 6-7 hours; review PFT results with patient. Lung nodule discussed-old granuloma based on CT scan. Recent cold treated by his primary physician and doing better. He will get his flu shot from his primary physician. PFT 06/29/2012-mild obstructive airways disease and small airways with response to bronchodilator. Normal lung volumes and diffusion. FEV13.10/106%, FEV1/FVC 0.79, FEF 25-75% 3.04/118% with 72% response to bronchodilator.  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 Previous cough resolved. He has no acute concerns but mentions back ache.  [facility] is watching is stable lung nodule on CT  11/112/15- 76 yoM former smoker followed for OSA complicated by hx CAD/PAD, aortic stenosis  Subjective:    Patient ID: Andre Miranda, male    DOB: 07-May-1936, 78 y.o.   MRN: [id number]  HPI 04/25/11- 26 yoM former smoker for re-establishment for sleep apnea. He really likes his CPAP which is set at 9 as of last visit here in 2006. We discussed available treatments. Original NPSG is available and will be scanned. Medical issues of OSA reviewed.   06/16/11- 74 yoM former smoker followed for OSA complicated by hx CAD/PAD, aortic stenosis Continues CPAP 10 [employer]. He uses new machine all night every night, with nasal pillows mask.  Breathing is ok otherwise. Download chip gets picked up tomorrow. He missed only one night when he had some congestion.   06/15/12- 74 yoM former smoker followed for OSA complicated by hx CAD/PAD, aortic stenosis Wears CPAP every night for approx 6 hours; pressure working well for patient and no supplies needed at this time. CPAP 10/ [employer] - "loves it". He brings CT scan report from [facility] dated January 28. CT of the abdomen description of lower chest including small hiatal hernia, coronary artery and aortic valvular calcification with old sternotomy. Scattered calcified granulomas throughout the lungs He had smoked 2 packs per day for 15 years. CABG four-vessel 2003.  08/17/12- 75 yoM former smoker followed  for OSA complicated by hx CAD/PAD, aortic stenosis Wears CPAP 10/ [employer] every night for about 6-7 hours; review PFT results with patient. Lung nodule discussed-old granuloma based on CT scan. Recent cold treated by his primary physician and doing better. He will get his flu shot from his primary physician. PFT 06/29/2012-mild  obstructive airways disease and small airways with response to bronchodilator. Normal lung volumes and diffusion. FEV13.10/106%, FEV1/FVC 0.79, FEF 25-75% 3.04/118% with 72% response to bronchodilator.  08/18/13- 33 yoM former smoker followed for OSA complicated by hx CAD/CABG/PAD, aortic stenosis, lung nodules FOLLOWS FOR: wear CPAP 10/ [employer] every night for about 6 hours; pressure works well for patient; DME is [employer] training [employer] education [employer]; needs rx sent to [place] for nasal pillows. Previous cough resolved. He has no acute concerns but mentions back ache.  [facility] is watching is stable lung nodule on CT  09/28/14- 76 yoM former smoker followed for OSA complicated by hx CAD/CABG/PAD, aortic stenosis, lung nodules CPAP 10/ [employer]    Had flu vaccine FOLLOW FOR:  OSA; Pt will bring card for download, could not obtain download; Pt states equipment is working well; wears CPAP every night approx 5-6 hours nightly [facility] is watching is stable lung nodules on CT Was off Ramipril for 2 months with no effect on chronic cough so he went back because of cost.  he wears his CPAP mask routinely unless he has a head cold with nasal congestion. New complaint of steady, nonpleuritic soreness right lateral chest for past few weeks. No sudden event or trauma.  ROS-see HPI Constitutional:   No-   weight loss, night sweats, fevers, chills, fatigue, lassitude. HEENT:   No-  headaches, difficulty swallowing, tooth/dental problems, sore throat,       No-  sneezing, itching, ear ache, nasal congestion, post nasal drip,  CV:  +chest pain,  No-orthopnea, PND, swelling in lower extremities, anasarca,   dizziness, palpitations Resp: No-   shortness of breath with exertion or at rest.              No-   productive cough,  + non-productive cough,  No- coughing up of blood.              No-   change in color of mucus.  No- wheezing.   Skin: No-   rash or lesions. GI:  No-   heartburn, indigestion, abdominal pain, nausea, vomiting,  GU:  MS:  No-    joint pain or swelling.  . Neuro-     nothing unusual Psych:  No- change in mood or affect. No depression or anxiety.  No memory loss.  Objective:   Physical Exam General- Alert, Oriented, Affect-appropriate, Distress- none acute  Overweight Skin- rash-none, lesions- none, excoriation- none Lymphadenopathy- none Head- atraumatic Eyes- Gross vision intact, PERRLA, conjunctivae clear, secretions Ears- Hearing, canals, Tm - normal Nose- Clear, No-Septal dev, mucus, polyps, erosion, perforation Mild external deviation Throat- Mallampati II-III , mucosa clear , drainage- none, tonsils- atrophic Neck- flexible , trachea midline, no stridor , thyroid nl, carotid no bruit   Thick neck Chest - symmetrical excursion , unlabored  Heart/CV- RRR , +2/6 precordial systolic murmur c/w AS , no gallop  , no rub, nl s1 s2  - JVD- none , edema- none, stasis changes- none, varices- none  Lung- clear to P&A, wheeze- none, cough-+ minor dry , dullness-none, rub- none   Chest wall- Abd-  Br/ Gen/ Rectal- Not done, not indicated Extrem- cyanosis- none, clubbing, none, atrophy- none, strength- nl  Neuro- grossly intact to observation  Assessment & Plan:

## 2014-09-28 NOTE — Assessment & Plan Note (Signed)
Not a clear description of the right-sided chest pain but it is not pleuritic and I don't recognize dullness or crepitus Plan-chest x-ray. Consider CT scan if needed.

## 2014-09-28 NOTE — Assessment & Plan Note (Signed)
Probable granulomas. Plan-chest x-ray

## 2014-09-28 NOTE — Assessment & Plan Note (Signed)
Murmur is stable

## 2014-09-28 NOTE — Patient Instructions (Signed)
OrderHuey Romans download CPAP for pressure compliance  Order- CXR  R chest wall pain  We can continue CPAP 10/ [employer] Romans

## 2014-09-29 NOTE — Progress Notes (Signed)
Quick Note:  Pt aware of results. ______ 

## 2014-10-09 DIAGNOSIS — L814 Other melanin hyperpigmentation: Secondary | ICD-10-CM | POA: Diagnosis not present

## 2014-10-09 DIAGNOSIS — D485 Neoplasm of uncertain behavior of skin: Secondary | ICD-10-CM | POA: Diagnosis not present

## 2014-10-09 DIAGNOSIS — D225 Melanocytic nevi of trunk: Secondary | ICD-10-CM | POA: Diagnosis not present

## 2014-10-09 DIAGNOSIS — L570 Actinic keratosis: Secondary | ICD-10-CM | POA: Diagnosis not present

## 2014-10-30 DIAGNOSIS — N35011 Post-traumatic bulbous urethral stricture: Secondary | ICD-10-CM | POA: Diagnosis not present

## 2014-10-30 DIAGNOSIS — C61 Malignant neoplasm of prostate: Secondary | ICD-10-CM | POA: Diagnosis not present

## 2014-10-30 DIAGNOSIS — R3989 Other symptoms and signs involving the genitourinary system: Secondary | ICD-10-CM | POA: Diagnosis not present

## 2014-10-30 DIAGNOSIS — R3911 Hesitancy of micturition: Secondary | ICD-10-CM | POA: Diagnosis not present

## 2014-11-20 ENCOUNTER — Ambulatory Visit (HOSPITAL_COMMUNITY): Payer: Medicare Other | Attending: Cardiology

## 2014-11-20 ENCOUNTER — Other Ambulatory Visit (HOSPITAL_COMMUNITY): Payer: Medicare Other | Admitting: Cardiology

## 2014-11-20 DIAGNOSIS — I350 Nonrheumatic aortic (valve) stenosis: Secondary | ICD-10-CM

## 2014-11-20 DIAGNOSIS — I359 Nonrheumatic aortic valve disorder, unspecified: Secondary | ICD-10-CM

## 2014-11-20 DIAGNOSIS — I358 Other nonrheumatic aortic valve disorders: Secondary | ICD-10-CM | POA: Insufficient documentation

## 2014-11-20 MED ORDER — PERFLUTREN LIPID MICROSPHERE
1.0000 mL | Freq: Once | INTRAVENOUS | Status: AC
  Administered 2014-11-20: 1 mL via INTRAVENOUS

## 2014-11-20 NOTE — Progress Notes (Signed)
2D Echo with Definity completed. 11/20/2014

## 2014-11-21 NOTE — Progress Notes (Signed)
Limited echo done. 11/21/2014

## 2014-11-29 ENCOUNTER — Ambulatory Visit (INDEPENDENT_AMBULATORY_CARE_PROVIDER_SITE_OTHER): Payer: Medicare Other | Admitting: Cardiology

## 2014-11-29 ENCOUNTER — Encounter: Payer: Self-pay | Admitting: Cardiology

## 2014-11-29 ENCOUNTER — Other Ambulatory Visit (HOSPITAL_COMMUNITY): Payer: Medicare Other

## 2014-11-29 VITALS — BP 138/76 | HR 59 | Ht 70.0 in | Wt 251.0 lb

## 2014-11-29 DIAGNOSIS — I25810 Atherosclerosis of coronary artery bypass graft(s) without angina pectoris: Secondary | ICD-10-CM

## 2014-11-29 DIAGNOSIS — I350 Nonrheumatic aortic (valve) stenosis: Secondary | ICD-10-CM

## 2014-11-29 NOTE — Patient Instructions (Signed)
Your physician recommends that you have  lab work today--Lipid profile/BMET/CBCd   Your physician recommends that you schedule a follow-up appointment in: 2 months with Dr Aundra Dubin.   Call our office [phone] if you decide you want to schedule an appointment with Dr Jeronimo Greaves Roxy Manns in the valve clinic.

## 2014-11-30 LAB — BASIC METABOLIC PANEL
BUN: 22 mg/dL (ref 6–23)
CO2: 28 mEq/L (ref 19–32)
Calcium: 9.4 mg/dL (ref 8.4–10.5)
Chloride: 103 mEq/L (ref 96–112)
Creatinine, Ser: 0.95 mg/dL (ref 0.40–1.50)
GFR: 81.86 mL/min (ref >60.00)
GLUCOSE: 96 mg/dL (ref 70–99)
Potassium: 4 mEq/L (ref 3.5–5.1)
Sodium: 140 mEq/L (ref 135–145)

## 2014-11-30 LAB — LIPID PANEL
Cholesterol: 109 mg/dL (ref 0–200)
HDL: 42.4 mg/dL (ref >39.00)
LDL Cholesterol: 46 mg/dL (ref 0–99)
NonHDL: 66.6
TRIGLYCERIDES: 104 mg/dL (ref 0.0–149.0)
Total CHOL/HDL Ratio: 3
VLDL: 20.8 mg/dL (ref 0.0–40.0)

## 2014-11-30 LAB — CBC WITH DIFFERENTIAL/PLATELET
Basophils Absolute: 0.1 10*3/uL (ref 0.0–0.1)
Basophils Relative: 0.7 % (ref 0.0–3.0)
EOS ABS: 0.3 10*3/uL (ref 0.0–0.7)
EOS PCT: 4 % (ref 0.0–5.0)
HCT: 45 % (ref 39.0–52.0)
Hemoglobin: 15 g/dL (ref 13.0–17.0)
LYMPHS ABS: 1.4 10*3/uL (ref 0.7–4.0)
Lymphocytes Relative: 16 % (ref 12.0–46.0)
MCHC: 33.3 g/dL (ref 30.0–36.0)
MCV: 93 fl (ref 78.0–100.0)
Monocytes Absolute: 0.8 10*3/uL (ref 0.1–1.0)
Monocytes Relative: 8.9 % (ref 3.0–12.0)
NEUTROS ABS: 6.1 10*3/uL (ref 1.4–7.7)
NEUTROS PCT: 70.4 % (ref 43.0–77.0)
PLATELETS: 133 10*3/uL — AB (ref 150.0–400.0)
RBC: 4.84 Mil/uL (ref 4.22–5.81)
RDW: 13.4 % (ref 11.5–15.5)
WBC: 8.7 10*3/uL (ref 4.0–10.5)

## 2014-11-30 NOTE — Progress Notes (Signed)
Patient ID: Andre Miranda, male   DOB: 02-Jan-1936, 79 y.o.   MRN: [id number] PCP: Dr. Joylene Draft  79 yo with history of CAD s/p CABG and aortic stenosis presents for cardiology followup.  He had CABG in 2003 and has not had a catheterization since.  Last Cardiolite in 6/11 showed no ischemia or infarction.  He has been followed by echo for aortic stenosis, which has been moderate in the past.  However, echo done earlier this month showed severe aortic stenosis with AVA 0.71 cm2, mean gradient 55 mmHg, and peak gradient 87 mmHg.    He is stable symptomatically.  No exertional dyspnea or chest pain with moderate exertion.  He does an exercise class three times a week.  No problem carrying grocery bags.  No lightheadedness or syncope.  Weight is down 3 lbs.   ECG: NSR, iRBBB  Labs (6/13): K 4.1, creatinine 1.0, LDL 64, HDL 44, TSH normal  PMH: 1. HTN 2. Gout 3. OSA: CPAP 4. Prostate cancer s/p radiation.  Has had radiation proctitis and cystitis with bleeding.  5. CAD: CABG 2003 with LIMA-LAD, RIMA-RCA, seq SVG-ramus and distal CFX.  Cardiolite in 6/11 with no ischemia or infarction, EF 56%.  6. Aortic stenosis: Echo (5/12) with EF 55-60%, mild LVH, probably moderate AS.  Echo (1/14) with EF 55-60%, moderate LVH, moderate AS with mean gradient 33 mmHg, peak 53 mmHg, RV mildly dilated.  Echo (1/16) with EF 60-65%, severe AS with AVA 0.71 cm2, mean gradient 55 mmHg, peak gradient 87 mmHg.   7. Carotid stenosis: Right CEA in 4/09 (Early).  Carotid dopplers (5/14) with patent R CEA, < 27% LICA stenosis.  8. CCY in 2003 9. Renal artery stenosis: Renal artery duplex (4/14) with 1-59% bilateral proximal RAS.   10. Lung nodules: Likely granulomas (stable).  11. PFTs (8/13): Mild obstructive airways disease.   SH: Retired Hotel manager (Retail banker), married, nonsmoker.   FH: No premature CAD  ROS: All systems reviewed and negative except as per HPI.   Current Outpatient Prescriptions  Medication  Sig Dispense Refill  . allopurinol (ZYLOPRIM) 300 MG tablet Take 300 mg by mouth daily.      Marland Kitchen amLODipine (NORVASC) 2.5 MG tablet Take 2.5 mg by mouth daily.      Marland Kitchen aspirin EC 81 MG tablet Take 1 tablet (81 mg total) by mouth daily.    . Cholecalciferol (VITAMIN D3) 1000 UNITS CAPS Take by mouth daily.      . CRESTOR 20 MG tablet Take 0.5 tablets by mouth Daily.    . famotidine (PEPCID) 20 MG tablet Take 20 mg by mouth daily.      . hydrochlorothiazide 25 MG tablet Take 25 mg by mouth daily.      . metoprolol (LOPRESSOR) 50 MG tablet Take 50 mg by mouth 2 (two) times daily.      . Multiple Vitamins-Minerals (CENTRUM SILVER) tablet Take 1 tablet by mouth daily.      . Omega-3 Fatty Acids (FISH OIL) 1000 MG CAPS Take 1 capsule by mouth 2 (two) times daily.     . polyethylene glycol (MIRALAX / GLYCOLAX) packet Take 17 g by mouth daily as needed.    . ramipril (ALTACE) 10 MG tablet Take 10 mg by mouth daily.      . Tamsulosin HCl (FLOMAX) 0.4 MG CAPS Take 0.4 mg by mouth daily.       No current facility-administered medications for this visit.    BP 138/76 mmHg  Pulse  59  Ht 5\' 10"  (1.778 m)  Wt 251 lb (113.853 kg)  BMI 36.01 kg/m2 General: NAD, obese Neck: No JVD, no thyromegaly or thyroid nodule.  Lungs: Clear to auscultation bilaterally with normal respiratory effort. CV: Nondisplaced PMI.  Heart regular S1/S2, no S3/S4, 3/6 crescendo-decrescendo murmur at RUSB with some muffling of S2.  No peripheral edema.  Bilateral carotid bruits versus referred bruit from AS.  Normal pedal pulses.  Abdomen: Soft, nontender, no hepatosplenomegaly, no distention.  Neurologic: Alert and oriented x 3.  Psych: Normal affect. Extremities: No clubbing or cyanosis.   Assessment/Plan:  Aortic stenosis This has progressed to the point where it is clearly severe.  He is minimally symptomatic.  I think that the best plan in this situation will be valve replacement.  I think that current data points towards  treatment of truly severe AS even in absence of significant symptoms though in the past practice has been to wait for the development of significant symptoms.  We had an extensive discussion about this.  If his coronary disease is stable, I think that he would be a good TAVR candidate given prior sternotomy.  I presented two paths: the first, which I recommended, would be cardiac cath to assess for progressive coronary disease followed by referral for valve replacement.  I would send him initially to Dr Servando Snare who did his prior CABG but I think that he will turn out to be a better TAVR than SAVR candidate given his prior sternotomy and advanced age.  If Dr Servando Snare were to concur, he would then go to TAVR clinic with Drs Burt Knack and Roxy Manns.  The second path would be ETT to determine whether or not he is truly asymptomatic, followed by watchful waiting/close followup for development of symptoms prior to valve replacement. He is going to think about things then call me to let me know what he wants to do.  Carotid arterial disease Follows with Dr. Donnetta Hutching once a year.  CAD  Stable with no ischemic symptoms. Continue ASA, statin, ramipril. Will need cardiac cath prior to valve replacement.  Hyperlipidemia Continue statin given hyperlipidemia.  Check lipids today. Hypertension BP appears to be under good control.  Loralie Champagne 11/30/2014 12:03 PM

## 2014-12-11 ENCOUNTER — Telehealth: Payer: Self-pay | Admitting: *Deleted

## 2014-12-11 ENCOUNTER — Encounter: Payer: Self-pay | Admitting: *Deleted

## 2014-12-11 DIAGNOSIS — I350 Nonrheumatic aortic (valve) stenosis: Secondary | ICD-10-CM

## 2014-12-11 NOTE — Telephone Encounter (Signed)
Pt has been scheduled for [facility]  12/22/14, lab 12/18/14. Discussed with patient.

## 2014-12-11 NOTE — Telephone Encounter (Signed)
-----   Message from Larey Dresser, MD sent at 12/10/2014  8:01 PM EST ----- Webb Silversmith,  Please call Mr Weekes Monday.  Will need to set him up for cardiac cath (femoral b/c he has bypass grafts) this week or next.  Then will need appointment in the Valve Clinic with Drs Ricard Dillon and Burt Knack.   [place]

## 2014-12-13 NOTE — Telephone Encounter (Signed)
OK if several days apart and creatinine checked in between.

## 2014-12-13 NOTE — Telephone Encounter (Signed)
Pt states chest CT at [facility] scheduled for 12/26/14 would be done with contrast. [facility] is scheduled for 12/22/14.   I will forward to Dr Aundra Dubin to ask if OK to have LHC and chest CT several days apart since both will be done with contrast.

## 2014-12-13 NOTE — Telephone Encounter (Signed)
Follow Up  Pt returning Anne's phone call. Please call back and discuss.

## 2014-12-14 NOTE — Telephone Encounter (Signed)
LMTCB

## 2014-12-14 NOTE — Telephone Encounter (Signed)
Follow up ° ° ° ° °Returning a nurses call °

## 2014-12-14 NOTE — Telephone Encounter (Signed)
Discussed with patient, he will let me know his decision about having CT at [facility] on 12/26/14.

## 2014-12-14 NOTE — Telephone Encounter (Signed)
busy

## 2014-12-18 ENCOUNTER — Other Ambulatory Visit: Payer: Medicare Other

## 2014-12-19 ENCOUNTER — Encounter: Payer: Self-pay | Admitting: Cardiovascular Disease

## 2014-12-19 ENCOUNTER — Ambulatory Visit (INDEPENDENT_AMBULATORY_CARE_PROVIDER_SITE_OTHER): Payer: Medicare Other | Admitting: Cardiovascular Disease

## 2014-12-19 VITALS — BP 116/76 | HR 75 | Ht 70.0 in | Wt 249.1 lb

## 2014-12-19 DIAGNOSIS — I350 Nonrheumatic aortic (valve) stenosis: Secondary | ICD-10-CM

## 2014-12-19 DIAGNOSIS — I25810 Atherosclerosis of coronary artery bypass graft(s) without angina pectoris: Secondary | ICD-10-CM

## 2014-12-19 NOTE — Progress Notes (Signed)
Patient ID: Andre Miranda MRN: [id number] DOB/AGE: 77-Aug-1937 77 y.o.  Primary Care Physician:PERINI,MARK A, MD Primary Cardiologist: Loralie Champagne, MD  HPI: 79 year-old male presenting for evaluation of severe aortic stenosis. The patient has a history of CAD s/p CABG by Dr Servando Snare in 2003. At that time he presented with symptoms of crescendo angina and was noted to have left main and 3 vessel CAD. He underwent 4 vessel CABG with a LIMA-LAD, RIMA-distal RCA, and sequential SVG-intermediate and distal LCx.  The patient has had regular cardiology follow-up and has been noted to have aortic stenosis for several years. However, by recent echo criteria he has progressed into the very severe aortic stenosis range with a mean transvalvular gradient of 55 mmHg. The patient reports minimal symptoms with normal activities. He attends a senior's exercise class three days per week and has no exertional symptoms. He was able to rake leaves this past Fall without problems. Also denies shortness of breath with climbing stairs. He denies lightheadedness, dizziness, or syncope. Reports occasional shooting chest pains with certain positions, primarily when he first lays down at night. No hx of congestive heart failure. He denies symptoms like those at the time of his CABG in 2003. No history of stoke or TIA. He does report some rectal bleeding related to radiation proctitis.  Other medical problems include hx of prostate CA s/p XRT in 2011. He has a history of cerebrovascular disease with CEA in 2009. The patient has a remote hx of tobacco use, but quit smoking in 1971.  Past Medical History  Diagnosis Date  . Hypertension   . Obesity   . Dysuria   . Hematuria   . Hyperlipidemia   . Aortic stenosis     MODERATE  . Sleep apnea   . Gout   . Carotid arterial disease     Past Surgical History  Procedure Laterality Date  . Carotid endarterectomy  02/2008  . Cholecystectomy  04/2002  . US  echocardiography  08/2009    SHOWED A MEAN GRADIENT ACROSS IS AORTIC VALVE OF 24 MM OF MERCURY. HE HAD MODERATE LVH AND NORMAL LV FUNCTION  . Coronary artery bypass graft  2003    LIMA TO THE LAD, RIMA TO THE RCA, AND A SAPHENOUS VEIN GRAFT TO THE INTERMEDIATE AND DISTAL LEFT CIRCUMFLEX    Family History  Problem Relation Age of Onset  . Heart attack Father   . Pneumonia Mother 54    History   Social History  . Marital Status: Married    Spouse Name: N/A    Number of Children: N/A  . Years of Education: N/A   Occupational History  . Not on file.   Social History Main Topics  . Smoking status: Former Smoker -- 1.50 packs/day for 7 years    Types: Cigarettes    Quit date: 04/03/1971  . Smokeless tobacco: Never Used  . Alcohol Use: Yes  . Drug Use: No  . Sexual Activity: Not on file   Other Topics Concern  . Not on file   Social History Narrative     Prior to Admission medications   Medication Sig Start Date End Date Taking? Authorizing Provider  allopurinol (ZYLOPRIM) 300 MG tablet Take 300 mg by mouth daily.      Historical Provider, MD  amLODipine (NORVASC) 2.5 MG tablet Take 2.5 mg by mouth daily.      Historical Provider, MD  aspirin EC 81 MG tablet Take 1 tablet (81 mg total)  by mouth daily. 11/23/12   Larey Dresser, MD  Cholecalciferol (VITAMIN D3) 1000 UNITS CAPS Take by mouth daily.      Historical Provider, MD  CRESTOR 20 MG tablet Take 0.5 tablets by mouth Daily. 08/27/11   Historical Provider, MD  famotidine (PEPCID) 20 MG tablet Take 20 mg by mouth daily.      Historical Provider, MD  hydrochlorothiazide 25 MG tablet Take 25 mg by mouth daily.      Historical Provider, MD  metoprolol (LOPRESSOR) 50 MG tablet Take 50 mg by mouth 2 (two) times daily.      Historical Provider, MD  Multiple Vitamins-Minerals (CENTRUM SILVER) tablet Take 1 tablet by mouth daily.      Historical Provider, MD  Omega-3 Fatty Acids (FISH OIL) 1000 MG CAPS Take 1 capsule by mouth 2  (two) times daily.     Historical Provider, MD  polyethylene glycol (MIRALAX / GLYCOLAX) packet Take 17 g by mouth daily as needed.    Historical Provider, MD  ramipril (ALTACE) 10 MG tablet Take 10 mg by mouth daily.      Historical Provider, MD  Tamsulosin HCl (FLOMAX) 0.4 MG CAPS Take 0.4 mg by mouth daily.      Historical Provider, MD    Allergies  Allergen Reactions  . Ibuprofen Hypertension    ROS:  Positive for hearing loss, blood in stool, back pain, snoring and sleep apnea, and urinary hesitancy. Otherwise negative except as above.  BP 116/76 mmHg  Pulse 75  Ht 5\' 10"  (1.778 m)  Wt 249 lb 1.9 oz (113 kg)  BMI 35.74 kg/m2  SpO2 97%  PHYSICAL EXAM: Pt is alert and oriented, WD, WN, pleasant overweight male in no distress. HEENT: normal Neck: JVP normal. Carotid upstrokes normal with bilateral bruits. No thyromegaly. Lungs: equal expansion, clear bilaterally CV: Apex is discrete and nondisplaced, RRR with late peaking grade 3/6 crescendo decrescendo murmur heard at the right upper sternal border and left lower sternal border with diminished A2  Abd: soft, NT, +BS, no bruit, no hepatosplenomegaly Back: no CVA tenderness Ext: no C/C/E        DP/PT pulses intact and = Skin: warm and dry without rash Neuro: CNII-XII intact             Strength intact = bilaterally  2D ECHO:  Left ventricle: The cavity size was normal. There was moderate focal basal and mild concentric hypertrophy. Systolic function was normal. The estimated ejection fraction was in the range of 60% to 65%. Wall motion was normal; there were no regional wall motion abnormalities. There was an increased relative contribution of atrial contraction to ventricular filling. Doppler parameters are consistent with abnormal left ventricular relaxation (grade 1 diastolic dysfunction).  ------------------------------------------------------------------- Aortic valve:  Trileaflet. Severe thickening and  calcification. Mobility was not restricted. Doppler:  There was severe stenosis. There was mild regurgitation.  VTI ratio of LVOT to aortic valve: 0.23. Valve area (VTI): 0.71 cm^2. Indexed valve area (VTI): 0.29 cm^2/m^2. Mean velocity ratio of LVOT to aortic valve: 0.18. Valve area (Vmean): 0.55 cm^2. Indexed valve area (Vmean): 0.23 cm^2/m^2.  Mean gradient (S): 55 mm Hg. Peak gradient (S): 87 mm Hg.  ------------------------------------------------------------------- Aorta: There is a mobile density in the aortic root of unclear etiology. This view was obtained fight after definity contrast was injected so could be residual contrast . Recommend having patient come back for limited study of the proximal aorta and aortic root. Aortic root: The aortic root was  normal in size.  ------------------------------------------------------------------- Mitral valve:  Structurally normal valve.  Mobility was not restricted. Doppler: Transvalvular velocity was within the normal range. There was no evidence for stenosis. There was trivial regurgitation.  Peak gradient (D): 4 mm Hg.  ------------------------------------------------------------------- Left atrium: The atrium was mildly dilated.  ------------------------------------------------------------------- Right ventricle: The cavity size was normal. Wall thickness was normal. Systolic function was normal.  ------------------------------------------------------------------- Pulmonic valve:  Structurally normal valve.  Cusp separation was normal. Doppler: Transvalvular velocity was within the normal range. There was no evidence for stenosis. There was trivial regurgitation.  ------------------------------------------------------------------- Tricuspid valve:  Structurally normal valve.  Doppler: Transvalvular velocity was within the normal range. There was  no regurgitation.  ------------------------------------------------------------------- Pulmonary artery:  The main pulmonary artery was normal-sized. Systolic pressure was within the normal range.  ------------------------------------------------------------------- Right atrium: The atrium was normal in size.  ------------------------------------------------------------------- Pericardium: There was no pericardial effusion.  ------------------------------------------------------------------- Systemic veins: Inferior vena cava: The vessel was normal in size.  ------------------------------------------------------------------- Post procedure conclusions Ascending Aorta:  - There is a mobile density in the aortic root of unclear etiology. This view was obtained fight after definity contrast was injected so could be residual contrast . Recommend having patient come back for limited study of the proximal aorta and aortic root.  CARDIAC CATH: study pending 12/22/14  STS RISK CALCULATOR:  RISK SCORES About the STS Risk Calculator  Procedure: AV Replacement  Risk of Mortality: 2.011% Morbidity or Mortality: 15.953% Long Length of Stay: 4.638% Short Length of Stay: 35.87% Permanent Stroke: 2.124% Prolonged Ventilation: 10.138% DSW Infection: 0.686% Renal Failure: 5.194% Reoperation: 7.003%  ASSESSMENT AND PLAN:  79 year old gentleman with severe aortic stenosis who reports no cardiac symptoms.  I have personally reviewed the patient's echo images. He clearly has a severely calcified, restricted valve leaflets with hemodynamic criteria consistent with severe aortic stenosis (mean gradient 55 mmHg and peak gradient 87 mmHg).  I have reviewed the natural history of aortic stenosis with the patient and his wife who was present today. We have discussed the limitations of medical therapy and the poor prognosis associated with very severe aortic stenosis. We have also reviewed  potential treatment options, including continued observation, conventional surgical aortic valve replacement, and transcatheter aortic valve replacement. We discussed treatment options in the context of this patient's specific comorbid medical conditions. Even considering his previous cardiac surgery, his predicted mortality risk with open surgical AVR is low.  Management decisions are somewhat complicated by his asymptomatic clinical status. There is some data that the patient with very severe aortic stenosis may benefit from early surgical intervention if at low risk of surgery. In fact the latest ACC/AHA guidelines recommend AVR for those with peak systolic velocity > 5 m/s or mean gradient > 60. This patient is approaching that level of hemodynamic change with his aortic stenosis.   After discussion of options, including further evaluation with cardiac cath and exercise treadmill testing, the patient requests referral to [facility] for a second opinion about treatment options. He does not wish to pursue cardiac cath until after his second opinion. I recommend that he go ahead and do the exercise treadmill study to evaluate for high-risk findings in the setting of severe aortic stenosis. This may be helpful in deciding how to proceed.   All of the patient's questions were answered today. Will arrange for an exercise treadmill study at the next available time.   I spent > 90 minutes conducting this consultation today, greater than 50%  of that time was spent in direct discussion with the patient and his wife about issues outlined above.   Sherren Mocha, MD  12/19/2014 2:28 [facility] [address], [place], [place]  [address] Phone: [phone]; Fax: [phone]

## 2014-12-19 NOTE — Patient Instructions (Addendum)
Your physician has requested that you have an exercise tolerance test on 01/02/15 with Dr Burt Knack. For further information please visit [email or web address]. Please also follow instruction sheet, as given.  Your physician recommends that you continue on your current medications as directed. Please refer to the Current Medication list given to you today.  Cardiac catheterization and pre-procedure labs have been cancelled.   We are in the process of referring you to [facility] for further TAVR evaluation.

## 2014-12-20 ENCOUNTER — Other Ambulatory Visit: Payer: Medicare Other

## 2014-12-22 ENCOUNTER — Encounter (HOSPITAL_COMMUNITY): Admission: RE | Payer: Self-pay | Source: Ambulatory Visit

## 2014-12-22 ENCOUNTER — Ambulatory Visit (HOSPITAL_COMMUNITY): Admission: RE | Admit: 2014-12-22 | Payer: Medicare Other | Source: Ambulatory Visit | Admitting: Cardiology

## 2014-12-22 SURGERY — LEFT HEART CATHETERIZATION WITH CORONARY/GRAFT ANGIOGRAM

## 2014-12-26 ENCOUNTER — Encounter: Payer: Self-pay | Admitting: Cardiovascular Disease

## 2014-12-26 NOTE — Telephone Encounter (Signed)
This encounter was created in error - please disregard.

## 2014-12-26 NOTE — Telephone Encounter (Signed)
New Msg        Pt calling, would like to know if any info received from [facility] about referral?     Please return call.

## 2015-01-02 ENCOUNTER — Ambulatory Visit (INDEPENDENT_AMBULATORY_CARE_PROVIDER_SITE_OTHER): Payer: Medicare Other | Admitting: Cardiovascular Disease

## 2015-01-02 DIAGNOSIS — I350 Nonrheumatic aortic (valve) stenosis: Secondary | ICD-10-CM | POA: Diagnosis not present

## 2015-01-02 NOTE — Progress Notes (Signed)
Exercise Treadmill Test  Pre-Exercise Testing Evaluation Rhythm: sinus bradycardia  Rate: 56 bpm     Test  Exercise Tolerance Test Ordering MD: Sherren Mocha, MD  Interpreting MD: Sherren Mocha, MD  Unique Test No: 1  Treadmill:  1  Indication for ETT: aortic stenosis  Contraindication to ETT: No   Stress Modality: exercise - treadmill  Cardiac Imaging Performed: non   Protocol: standard Bruce - maximal  Max BP:  207/109  Max MPHR (bpm):  144 MPR (bpm):  122  MPHR obtained (bpm):  97 % MPHR obtained:  68  Reached 85% MPHR (min:sec):  Not reached Total Exercise Time (min-sec):  4:54  Workload in METS:  6.8 Borg Scale: 12  Reason ETT Terminated:  dyspnea    ST Segment Analysis At Rest: normal ST segments - no evidence of significant ST depression With Exercise: borderline ST changes  Other Information Arrhythmia:  No Angina during ETT:  present (1) Quality of ETT:  diagnostic  ETT Interpretation:  abnormal - evidence of ST depression consistent with ischemia  Comments: Study done for asymptomatic severe aortic stenosis.   With low-level exercise (6.8 mets) there is shortness of breath, mild angina, and 1 mm diffuse flat ST segment depression.  Hypertensive response to exercise.  Recommendations: Objective evidence of symptomatic aortic stenosis +/- ischemic heart disease in patient with prior CABG. Recommend moving forward with evaluation for aortic valve replacement (TAVR versus conventional AVR). Pt scheduled to see Dr Aline Brochure at [facility] later this week.

## 2015-01-04 ENCOUNTER — Telehealth: Payer: Self-pay | Admitting: Cardiology

## 2015-01-04 NOTE — Telephone Encounter (Signed)
Pt requesting cd of last Echo with [facility] , will pick up this pm or tomorrow am before he goes to his appt in the morning, pls call when ready at [phone]

## 2015-01-05 DIAGNOSIS — E782 Mixed hyperlipidemia: Secondary | ICD-10-CM | POA: Diagnosis not present

## 2015-01-05 DIAGNOSIS — R0602 Shortness of breath: Secondary | ICD-10-CM | POA: Diagnosis not present

## 2015-01-05 DIAGNOSIS — Z8546 Personal history of malignant neoplasm of prostate: Secondary | ICD-10-CM | POA: Diagnosis not present

## 2015-01-05 DIAGNOSIS — I679 Cerebrovascular disease, unspecified: Secondary | ICD-10-CM | POA: Diagnosis not present

## 2015-01-05 DIAGNOSIS — I350 Nonrheumatic aortic (valve) stenosis: Secondary | ICD-10-CM | POA: Diagnosis not present

## 2015-01-05 DIAGNOSIS — I2510 Atherosclerotic heart disease of native coronary artery without angina pectoris: Secondary | ICD-10-CM | POA: Diagnosis not present

## 2015-01-05 DIAGNOSIS — I10 Essential (primary) hypertension: Secondary | ICD-10-CM | POA: Diagnosis not present

## 2015-01-08 DIAGNOSIS — I350 Nonrheumatic aortic (valve) stenosis: Secondary | ICD-10-CM | POA: Diagnosis not present

## 2015-01-08 DIAGNOSIS — R0602 Shortness of breath: Secondary | ICD-10-CM | POA: Diagnosis not present

## 2015-01-08 DIAGNOSIS — I517 Cardiomegaly: Secondary | ICD-10-CM | POA: Diagnosis not present

## 2015-01-08 DIAGNOSIS — R918 Other nonspecific abnormal finding of lung field: Secondary | ICD-10-CM | POA: Diagnosis not present

## 2015-01-09 ENCOUNTER — Telehealth: Payer: Self-pay | Admitting: Internal Medicine

## 2015-01-09 NOTE — Telephone Encounter (Signed)
PFT from 2013 will be printed and placed up front for pickup. Pt is aware. Nothing further was needed.

## 2015-01-10 DIAGNOSIS — I2510 Atherosclerotic heart disease of native coronary artery without angina pectoris: Secondary | ICD-10-CM | POA: Diagnosis not present

## 2015-01-10 DIAGNOSIS — I350 Nonrheumatic aortic (valve) stenosis: Secondary | ICD-10-CM | POA: Diagnosis not present

## 2015-01-19 DIAGNOSIS — Z006 Encounter for examination for normal comparison and control in clinical research program: Secondary | ICD-10-CM | POA: Diagnosis not present

## 2015-01-19 DIAGNOSIS — I350 Nonrheumatic aortic (valve) stenosis: Secondary | ICD-10-CM | POA: Diagnosis not present

## 2015-01-25 ENCOUNTER — Encounter: Payer: Self-pay | Admitting: Cardiology

## 2015-02-01 ENCOUNTER — Ambulatory Visit: Payer: Medicare Other | Admitting: Cardiology

## 2015-02-05 DIAGNOSIS — J811 Chronic pulmonary edema: Secondary | ICD-10-CM | POA: Diagnosis not present

## 2015-02-05 DIAGNOSIS — I679 Cerebrovascular disease, unspecified: Secondary | ICD-10-CM | POA: Diagnosis not present

## 2015-02-05 DIAGNOSIS — D696 Thrombocytopenia, unspecified: Secondary | ICD-10-CM | POA: Diagnosis present

## 2015-02-05 DIAGNOSIS — R918 Other nonspecific abnormal finding of lung field: Secondary | ICD-10-CM | POA: Diagnosis not present

## 2015-02-05 DIAGNOSIS — Z01818 Encounter for other preprocedural examination: Secondary | ICD-10-CM | POA: Diagnosis not present

## 2015-02-05 DIAGNOSIS — I2583 Coronary atherosclerosis due to lipid rich plaque: Secondary | ICD-10-CM | POA: Diagnosis present

## 2015-02-05 DIAGNOSIS — K567 Ileus, unspecified: Secondary | ICD-10-CM | POA: Diagnosis not present

## 2015-02-05 DIAGNOSIS — I5032 Chronic diastolic (congestive) heart failure: Secondary | ICD-10-CM | POA: Diagnosis not present

## 2015-02-05 DIAGNOSIS — I471 Supraventricular tachycardia: Secondary | ICD-10-CM | POA: Diagnosis not present

## 2015-02-05 DIAGNOSIS — I10 Essential (primary) hypertension: Secondary | ICD-10-CM | POA: Diagnosis not present

## 2015-02-05 DIAGNOSIS — E785 Hyperlipidemia, unspecified: Secondary | ICD-10-CM | POA: Diagnosis present

## 2015-02-05 DIAGNOSIS — K3189 Other diseases of stomach and duodenum: Secondary | ICD-10-CM | POA: Diagnosis not present

## 2015-02-05 DIAGNOSIS — Z951 Presence of aortocoronary bypass graft: Secondary | ICD-10-CM | POA: Diagnosis not present

## 2015-02-05 DIAGNOSIS — Z006 Encounter for examination for normal comparison and control in clinical research program: Secondary | ICD-10-CM | POA: Diagnosis not present

## 2015-02-05 DIAGNOSIS — G8918 Other acute postprocedural pain: Secondary | ICD-10-CM | POA: Diagnosis not present

## 2015-02-05 DIAGNOSIS — R140 Abdominal distension (gaseous): Secondary | ICD-10-CM | POA: Diagnosis not present

## 2015-02-05 DIAGNOSIS — J9811 Atelectasis: Secondary | ICD-10-CM | POA: Diagnosis not present

## 2015-02-05 DIAGNOSIS — I2510 Atherosclerotic heart disease of native coronary artery without angina pectoris: Secondary | ICD-10-CM | POA: Diagnosis not present

## 2015-02-05 DIAGNOSIS — I350 Nonrheumatic aortic (valve) stenosis: Secondary | ICD-10-CM | POA: Diagnosis not present

## 2015-02-06 HISTORY — PX: AORTIC VALVE REPAIR: SHX6306

## 2015-02-21 DIAGNOSIS — Z952 Presence of prosthetic heart valve: Secondary | ICD-10-CM | POA: Diagnosis not present

## 2015-03-06 DIAGNOSIS — Z006 Encounter for examination for normal comparison and control in clinical research program: Secondary | ICD-10-CM | POA: Diagnosis not present

## 2015-03-06 DIAGNOSIS — I350 Nonrheumatic aortic (valve) stenosis: Secondary | ICD-10-CM | POA: Diagnosis not present

## 2015-03-06 DIAGNOSIS — Z952 Presence of prosthetic heart valve: Secondary | ICD-10-CM | POA: Diagnosis not present

## 2015-03-27 DIAGNOSIS — Z48812 Encounter for surgical aftercare following surgery on the circulatory system: Secondary | ICD-10-CM | POA: Diagnosis not present

## 2015-03-27 DIAGNOSIS — Z5189 Encounter for other specified aftercare: Secondary | ICD-10-CM | POA: Diagnosis not present

## 2015-04-03 ENCOUNTER — Ambulatory Visit: Payer: Medicare Other | Admitting: Family

## 2015-04-03 ENCOUNTER — Other Ambulatory Visit (HOSPITAL_COMMUNITY): Payer: Medicare Other

## 2015-04-06 ENCOUNTER — Encounter: Payer: Self-pay | Admitting: Family

## 2015-04-10 ENCOUNTER — Ambulatory Visit (INDEPENDENT_AMBULATORY_CARE_PROVIDER_SITE_OTHER): Payer: Medicare Other | Admitting: Family

## 2015-04-10 ENCOUNTER — Encounter: Payer: Self-pay | Admitting: Family

## 2015-04-10 ENCOUNTER — Ambulatory Visit (HOSPITAL_COMMUNITY)
Admission: RE | Admit: 2015-04-10 | Discharge: 2015-04-10 | Disposition: A | Discharge status: Home / Self Care | Payer: Medicare Other | Source: Ambulatory Visit | Attending: Vascular Surgery | Admitting: Vascular Surgery

## 2015-04-10 VITALS — BP 127/74 | HR 57 | Resp 14 | Ht 70.0 in | Wt 249.0 lb

## 2015-04-10 DIAGNOSIS — Z9889 Other specified postprocedural states: Secondary | ICD-10-CM

## 2015-04-10 DIAGNOSIS — I6523 Occlusion and stenosis of bilateral carotid arteries: Secondary | ICD-10-CM

## 2015-04-10 DIAGNOSIS — Z48812 Encounter for surgical aftercare following surgery on the circulatory system: Secondary | ICD-10-CM

## 2015-04-10 NOTE — Progress Notes (Signed)
Established Carotid Patient   History of Present Illness  Andre Miranda is a 79 y.o. male patient of Dr. Donnetta Hutching who is s/p right carotid endarterectomy by Dr. Donnetta Hutching in 2009 for severe asymptomatic carotid disease.  The patient denies any history of TIA or stroke symptoms, specifically the patient denies a history of amaurosis fugax or monocular blindness, denies a history unilateral  of facial drooping, denies a history of hemiplegia, and denies a history of receptive or expressive aphasia.   He denies tingling, numbness, pain, or cold feeling in either upper extremity.  The patient reports New Medical or Surgical History: aortic valve replaced March 2016 for asymptomatic aortic valve stenosis. Dr. Loralie Champagne is his cardiologist.  Pt Diabetic: no Pt smoker: former smoker, quit in 1972  Pt meds include: Statin : yes ASA: yes Other anticoagulants/antiplatelets: Plavix for 3 months post aortic valve relpalcement   Past Medical History  Diagnosis Date  . Hypertension   . Obesity   . Dysuria   . Hematuria   . Hyperlipidemia   . Aortic stenosis     MODERATE  . Sleep apnea   . Gout   . Carotid arterial disease     Social History History  Substance Use Topics  . Smoking status: Former Smoker -- 1.50 packs/day for 7 years    Types: Cigarettes    Quit date: 04/03/1971  . Smokeless tobacco: Never Used  . Alcohol Use: Yes    Family History Family History  Problem Relation Age of Onset  . Heart attack Father   . Pneumonia Mother 26    Surgical History Past Surgical History  Procedure Laterality Date  . Carotid endarterectomy  02/2008  . Cholecystectomy  04/2002  . US echocardiography  08/2009    SHOWED A MEAN GRADIENT ACROSS IS AORTIC VALVE OF 24 MM OF MERCURY. HE HAD MODERATE LVH AND NORMAL LV FUNCTION  . Coronary artery bypass graft  2003    LIMA TO THE LAD, RIMA TO THE RCA, AND A SAPHENOUS VEIN GRAFT TO THE INTERMEDIATE AND DISTAL LEFT CIRCUMFLEX    Allergies   Allergen Reactions  . Ibuprofen Anaphylaxis and Hypertension  . Penicillin G Other (See Comments)    Pt. Does not want to take this medication because of the reaction hie Father had; it almost killed his Dad.    Current Outpatient Prescriptions  Medication Sig Dispense Refill  . allopurinol (ZYLOPRIM) 300 MG tablet Take 300 mg by mouth daily.      Marland Kitchen amLODipine (NORVASC) 2.5 MG tablet Take 5 mg by mouth daily.     Marland Kitchen aspirin EC 81 MG tablet Take 1 tablet (81 mg total) by mouth daily.    . Cholecalciferol (VITAMIN D3) 1000 UNITS CAPS Take by mouth daily.      . clopidogrel (PLAVIX) 75 MG tablet Take by mouth daily.    . CRESTOR 20 MG tablet Take 0.5 tablets by mouth Daily.    . famotidine (PEPCID) 20 MG tablet Take 20 mg by mouth daily.      . hydrochlorothiazide 25 MG tablet Take 25 mg by mouth daily.      . metoprolol (LOPRESSOR) 50 MG tablet Take 50 mg by mouth 2 (two) times daily.      . Multiple Vitamins-Minerals (CENTRUM SILVER) tablet Take 1 tablet by mouth daily.      . Omega-3 Fatty Acids (FISH OIL) 1000 MG CAPS Take 1 capsule by mouth 2 (two) times daily.     . ramipril (  ALTACE) 10 MG tablet Take 10 mg by mouth daily.      . Tamsulosin HCl (FLOMAX) 0.4 MG CAPS Take 0.4 mg by mouth daily.      . polyethylene glycol (MIRALAX / GLYCOLAX) packet Take 17 g by mouth daily as needed.     No current facility-administered medications for this visit.    Review of Systems : See HPI for pertinent positives and negatives.  Physical Examination  Filed Vitals:   04/10/15 1327 04/10/15 1330  BP: 128/72 127/74  Pulse: 56 57  Resp:  14  Height:  5\' 10"  (1.778 m)  Weight:  249 lb (112.946 kg)  SpO2:  98%   Body mass index is 35.73 kg/(m^2).  General: WDWN obese male in NAD GAIT: normal Eyes: PERRLA Pulmonary:  Non-labored, CTAB, Negative  Rales, Negative rhonchi, & Negative wheezing.  Cardiac: regular Rhythm, no detected murmur.  VASCULAR EXAM Carotid Bruits Right Left    Negative Negative    Aorta is not palpable. Radial pulses are 2+ palpable and equal.                                                                                                                            LE Pulses Right Left       POPLITEAL  not palpable   not palpable       POSTERIOR TIBIAL  not palpable   faintly palpable        DORSALIS PEDIS      ANTERIOR TIBIAL 2+ palpable  not palpable     Gastrointestinal: soft, nontender, BS WNL, no r/g,  no palpable masses.  Musculoskeletal: Negative muscle atrophy/wasting. M/S 5/5 throughout, Extremities without ischemic changes.  Neurologic: A&O X 3; Appropriate Affect;  Speech is normal CN 2-12 intact, Pain and light touch intact in extremities, Motor exam as listed above.   Non-Invasive Vascular Imaging CAROTID DUPLEX 04/10/2015   CEREBROVASCULAR DUPLEX EVALUATION    INDICATION: Carotid artery disease     PREVIOUS INTERVENTION(S): Right carotid endarterectomy 03/14/2008.    DUPLEX EXAM:     RIGHT  LEFT  Peak Systolic Velocities (cm/s) End Diastolic Velocities (cm/s) Plaque LOCATION Peak Systolic Velocities (cm/s) End Diastolic Velocities (cm/s) Plaque  93 18  CCA PROXIMAL 124 16   78 16  CCA MID 76 15   99 15 HM CCA DISTAL 64 13 HT  111 12  ECA 136 6 HT  76 13  ICA PROXIMAL 45 10 HT  73 21  ICA MID 59 19 HT  75 20  ICA DISTAL 58 18     NA ICA / CCA Ratio (PSV) 0.77  Antegrade  Vertebral Flow Antegrade   355 Brachial Systolic Pressure (mmHg) 974  Multiphasic (Subclavian artery) Brachial Artery Waveforms Multiphasic (Subclavian artery)    Plaque Morphology:  HM = Homogeneous, HT = Heterogeneous, CP = Calcific Plaque, SP = Smooth Plaque, IP = Irregular Plaque  ADDITIONAL FINDINGS:     IMPRESSION: Patent  right carotid endarterectomy site with evidence of mild hyperplasia present at the proximal patch site, velocities suggest 1-49%.  Left internal carotid artery velocities suggest a 1-49% stenosis.     Compared to  the previous exam:  No significant change in comparison to the last exam on 03/28/2014.      Assessment: Andre Miranda is a 79 y.o. male who is s/p right carotid endarterectomy on 03/14/2008. He has no history of stroke or TIA. Today's carotid Duplex suggests a patent right carotid endarterectomy site with evidence of mild hyperplasia present at the proximal patch site, velocities suggest 1-49%.  Left internal carotid artery velocities suggest a 1-49% stenosis. No significant change in comparison to the last exam on 03/28/2014.  His atherosclerotic risk factors include former smoker (quit in 1972), obesity, and dyslipidemia (he is on a statin).  Fortunately he does not have DM.   Plan: Follow-up in 1 year with Carotid Duplex.   I discussed in depth with the patient the nature of atherosclerosis, and emphasized the importance of maximal medical management including strict control of blood pressure, blood glucose, and lipid levels, obtaining regular exercise, and continued cessation of smoking.  The patient is aware that without maximal medical management the underlying atherosclerotic disease process will progress, limiting the benefit of any interventions. The patient was given information about stroke prevention and what symptoms should prompt the patient to seek immediate medical care. Thank you for allowing [place] to participate in this patient's care.  Clemon Chambers, RN, MSN, FNP-C [facility] Office: [phone]  Clinic Physician: Early  04/10/2015 1:39 PM

## 2015-04-10 NOTE — Patient Instructions (Signed)
Stroke Prevention Some medical conditions and behaviors are associated with an increased chance of having a stroke. You may prevent a stroke by making healthy choices and managing medical conditions. HOW CAN I REDUCE MY RISK OF HAVING A STROKE?   Stay physically active. Get at least 30 minutes of activity on most or all days.  Do not smoke. It may also be helpful to avoid exposure to secondhand smoke.  Limit alcohol use. Moderate alcohol use is considered to be:  No more than 2 drinks per day for men.  No more than 1 drink per day for nonpregnant women.  Eat healthy foods. This involves:  Eating 5 or more servings of fruits and vegetables a day.  Making dietary changes that address high blood pressure (hypertension), high cholesterol, diabetes, or obesity.  Manage your cholesterol levels.  Making food choices that are high in fiber and low in saturated fat, trans fat, and cholesterol may control cholesterol levels.  Take any prescribed medicines to control cholesterol as directed by your health care provider.  Manage your diabetes.  Controlling your carbohydrate and sugar intake is recommended to manage diabetes.  Take any prescribed medicines to control diabetes as directed by your health care provider.  Control your hypertension.  Making food choices that are low in salt (sodium), saturated fat, trans fat, and cholesterol is recommended to manage hypertension.  Take any prescribed medicines to control hypertension as directed by your health care provider.  Maintain a healthy weight.  Reducing calorie intake and making food choices that are low in sodium, saturated fat, trans fat, and cholesterol are recommended to manage weight.  Stop drug abuse.  Avoid taking birth control pills.  Talk to your health care provider about the risks of taking birth control pills if you are over 35 years old, smoke, get migraines, or have ever had a blood clot.  Get evaluated for sleep  disorders (sleep apnea).  Talk to your health care provider about getting a sleep evaluation if you snore a lot or have excessive sleepiness.  Take medicines only as directed by your health care provider.  For some people, aspirin or blood thinners (anticoagulants) are helpful in reducing the risk of forming abnormal blood clots that can lead to stroke. If you have the irregular heart rhythm of atrial fibrillation, you should be on a blood thinner unless there is a good reason you cannot take them.  Understand all your medicine instructions.  Make sure that other conditions (such as anemia or atherosclerosis) are addressed. SEEK IMMEDIATE MEDICAL CARE IF:   You have sudden weakness or numbness of the face, arm, or leg, especially on one side of the body.  Your face or eyelid droops to one side.  You have sudden confusion.  You have trouble speaking (aphasia) or understanding.  You have sudden trouble seeing in one or both eyes.  You have sudden trouble walking.  You have dizziness.  You have a loss of balance or coordination.  You have a sudden, severe headache with no known cause.  You have new chest pain or an irregular heartbeat. Any of these symptoms may represent a serious problem that is an emergency. Do not wait to see if the symptoms will go away. Get medical help at once. Call your local emergency services (911 in [place] Do not drive yourself to the hospital. Document Released: 12/11/2004 Document Revised: 03/20/2014 Document Reviewed: 05/06/2013 ExitCare Patient Information 2015 [employer]. This information is not intended to replace advice given   to you by your health care provider. Make sure you discuss any questions you have with your health care provider.  

## 2015-05-14 ENCOUNTER — Other Ambulatory Visit: Payer: Self-pay

## 2015-05-25 DIAGNOSIS — H43811 Vitreous degeneration, right eye: Secondary | ICD-10-CM | POA: Diagnosis not present

## 2015-06-12 DIAGNOSIS — E785 Hyperlipidemia, unspecified: Secondary | ICD-10-CM | POA: Diagnosis not present

## 2015-06-12 DIAGNOSIS — I2510 Atherosclerotic heart disease of native coronary artery without angina pectoris: Secondary | ICD-10-CM | POA: Diagnosis not present

## 2015-06-12 DIAGNOSIS — R7301 Impaired fasting glucose: Secondary | ICD-10-CM | POA: Diagnosis not present

## 2015-06-12 DIAGNOSIS — I10 Essential (primary) hypertension: Secondary | ICD-10-CM | POA: Diagnosis not present

## 2015-06-12 DIAGNOSIS — M109 Gout, unspecified: Secondary | ICD-10-CM | POA: Diagnosis not present

## 2015-06-12 DIAGNOSIS — Z125 Encounter for screening for malignant neoplasm of prostate: Secondary | ICD-10-CM | POA: Diagnosis not present

## 2015-06-19 DIAGNOSIS — E669 Obesity, unspecified: Secondary | ICD-10-CM | POA: Diagnosis not present

## 2015-06-19 DIAGNOSIS — Z0000 Encounter for general adult medical examination without abnormal findings: Secondary | ICD-10-CM | POA: Diagnosis not present

## 2015-06-19 DIAGNOSIS — E785 Hyperlipidemia, unspecified: Secondary | ICD-10-CM | POA: Diagnosis not present

## 2015-06-19 DIAGNOSIS — R911 Solitary pulmonary nodule: Secondary | ICD-10-CM | POA: Diagnosis not present

## 2015-06-19 DIAGNOSIS — Z1389 Encounter for screening for other disorder: Secondary | ICD-10-CM | POA: Diagnosis not present

## 2015-06-19 DIAGNOSIS — G4730 Sleep apnea, unspecified: Secondary | ICD-10-CM | POA: Diagnosis not present

## 2015-06-19 DIAGNOSIS — R7301 Impaired fasting glucose: Secondary | ICD-10-CM | POA: Diagnosis not present

## 2015-06-19 DIAGNOSIS — M545 Low back pain: Secondary | ICD-10-CM | POA: Diagnosis not present

## 2015-06-19 DIAGNOSIS — C61 Malignant neoplasm of prostate: Secondary | ICD-10-CM | POA: Diagnosis not present

## 2015-06-19 DIAGNOSIS — Z6836 Body mass index (BMI) 36.0-36.9, adult: Secondary | ICD-10-CM | POA: Diagnosis not present

## 2015-06-19 DIAGNOSIS — R809 Proteinuria, unspecified: Secondary | ICD-10-CM | POA: Diagnosis not present

## 2015-06-19 DIAGNOSIS — I2510 Atherosclerotic heart disease of native coronary artery without angina pectoris: Secondary | ICD-10-CM | POA: Diagnosis not present

## 2015-06-20 ENCOUNTER — Encounter: Payer: Self-pay | Admitting: Cardiology

## 2015-08-17 ENCOUNTER — Encounter: Payer: Self-pay | Admitting: Cardiology

## 2015-08-17 ENCOUNTER — Ambulatory Visit (INDEPENDENT_AMBULATORY_CARE_PROVIDER_SITE_OTHER): Payer: Medicare Other | Admitting: Cardiology

## 2015-08-17 ENCOUNTER — Ambulatory Visit: Payer: Medicare Other | Admitting: Cardiology

## 2015-08-17 VITALS — BP 142/70 | HR 60 | Ht 70.0 in | Wt 253.0 lb

## 2015-08-17 DIAGNOSIS — I2510 Atherosclerotic heart disease of native coronary artery without angina pectoris: Secondary | ICD-10-CM | POA: Diagnosis not present

## 2015-08-17 DIAGNOSIS — I350 Nonrheumatic aortic (valve) stenosis: Secondary | ICD-10-CM | POA: Diagnosis not present

## 2015-08-17 DIAGNOSIS — I6523 Occlusion and stenosis of bilateral carotid arteries: Secondary | ICD-10-CM

## 2015-08-17 NOTE — Patient Instructions (Signed)
Medication Instructions:  No changes today  Labwork: None today  Testing/Procedures: None today  Follow-Up: Your physician wants you to follow-up in: 9 months with Dr Aundra Dubin. (June 2017). You will receive a reminder letter in the mail two months in advance. If you don't receive a letter, please call our office to schedule the follow-up appointment.

## 2015-08-19 NOTE — Progress Notes (Signed)
Patient ID: Andre Miranda, male   DOB: 06/26/1936, 79 y.o.   MRN: [id number] PCP: Dr. Joylene Draft  79 yo with history of CAD s/p CABG and aortic stenosis presents for cardiology followup.  He had CABG in 2003 and has not had a catheterization since.  Last Cardiolite in 6/11 showed no ischemia or infarction.  He developed severe aortic stenosis.  I thought he would be best served by TAVR.  He ended up opting to go to [facility] for this.  He had 27 mm DFM as part of SALUS trial in 2/16.  Most recent echo in 5/16 showed well-seated bioprosthetic aortic valve.    He is stable symptomatically.  No exertional dyspnea or chest pain.  He had trouble with left groin healing following the procedure but otherwise has done ok.  BP mildly high today but SBP has been in the 130s at home.   Labs (6/13): K 4.1, creatinine 1.0, LDL 64, HDL 44, TSH normal Labs (7/16): K 3.9, creatinine 0.9, LDL 49  PMH: 1. HTN 2. Gout 3. OSA: CPAP 4. Prostate cancer s/p radiation.  Has had radiation proctitis and cystitis with bleeding.  5. CAD: CABG 2003 with LIMA-LAD, RIMA-RCA, seq SVG-ramus and distal CFX.  Cardiolite in 6/11 with no ischemia or infarction, EF 56%. LHC pre-TAVR in 2016 showed stable anatomy (done at [facility]).  6. Aortic stenosis: Echo (5/12) with EF 55-60%, mild LVH, probably moderate AS.  Echo (1/14) with EF 55-60%, moderate LVH, moderate AS with mean gradient 33 mmHg, peak 53 mmHg, RV mildly dilated.  Echo (1/16) with EF 60-65%, severe AS with AVA 0.71 cm2, mean gradient 55 mmHg, peak gradient 87 mmHg.  He had TAVR with 27 mm DFM as part of SALUS trial in 2/16.  Echo (5/16) with EF 55%, bioprosthetic aortic valve mean gradient 11 mmHg, normal RV size and systolic function.  7. Carotid stenosis: Right CEA in 4/09 (Early).  Carotid dopplers (5/14) with patent R CEA, < 54% LICA stenosis.  8. CCY in 2003 9. Renal artery stenosis: Renal artery duplex (4/14) with 1-59% bilateral proximal RAS.   10. Lung nodules: Likely  granulomas (stable).  11. PFTs (8/13): Mild obstructive airways disease.   SH: Retired Hotel manager (Retail banker), married, nonsmoker.   FH: No premature CAD  ROS: All systems reviewed and negative except as per HPI.   Current Outpatient Prescriptions  Medication Sig Dispense Refill  . allopurinol (ZYLOPRIM) 300 MG tablet Take 300 mg by mouth daily.      Marland Kitchen amLODipine (NORVASC) 2.5 MG tablet Take 5 mg by mouth daily.     Marland Kitchen aspirin EC 81 MG tablet Take 1 tablet (81 mg total) by mouth daily.    . Cholecalciferol (VITAMIN D3) 1000 UNITS CAPS Take by mouth daily.      . clindamycin (CLEOCIN) 300 MG capsule Take 300 mg by mouth as needed. 1 HOUR PRIOR TO DENTIST WORK    . CRESTOR 20 MG tablet Take 0.5 tablets by mouth Daily.    . famotidine (PEPCID) 20 MG tablet Take 20 mg by mouth daily.      . hydrochlorothiazide 25 MG tablet Take 25 mg by mouth daily.      . metoprolol (LOPRESSOR) 50 MG tablet Take 50 mg by mouth 2 (two) times daily.      . Multiple Vitamins-Minerals (CENTRUM SILVER) tablet Take 1 tablet by mouth daily.      . ramipril (ALTACE) 10 MG tablet Take 10 mg by mouth daily.      Marland Kitchen  Sennosides (SENOKOT PO) Take by mouth as needed. FOR CONSTIPATION    . Tamsulosin HCl (FLOMAX) 0.4 MG CAPS Take 0.4 mg by mouth daily.       No current facility-administered medications for this visit.    BP 142/70 mmHg  Pulse 60  Ht 5\' 10"  (1.778 m)  Wt 253 lb (114.76 kg)  BMI 36.30 kg/m2 General: NAD, obese Neck: No JVD, no thyromegaly or thyroid nodule.  Lungs: Clear to auscultation bilaterally with normal respiratory effort. CV: Nondisplaced PMI.  Heart regular S1/S2, no S3/S4, 2/6 early SEM RUSB.  No peripheral edema.  No carotid bruit.  Normal pedal pulses.  Abdomen: Soft, nontender, no hepatosplenomegaly, no distention.  Neurologic: Alert and oriented x 3.  Psych: Normal affect. Extremities: No clubbing or cyanosis.   Assessment/Plan:  Aortic stenosis Status post TAVR in 2/16  with 27 mm DFM valve as part of SALUS trial.  Echo in 5/16 showed well-seated bioprosthetic aortic valve.  He will have followup echoes through the trial at [facility].  Carotid arterial disease Follows with Dr. Donnetta Hutching once a year.  CAD  Stable with no ischemic symptoms. Continue ASA, statin, ramipril. Pre-op cath at [facility] looked ok per his report.  Hyperlipidemia Continue statin given hyperlipidemia.  Good lipids in 7/16. Hypertension BP controlled on home checks.   Loralie Champagne 08/19/2015

## 2015-09-06 ENCOUNTER — Encounter (HOSPITAL_COMMUNITY)
Admission: RE | Admit: 2015-09-06 | Discharge: 2015-09-06 | Disposition: A | Discharge status: Home / Self Care | Payer: Medicare Other | Source: Ambulatory Visit | Attending: Cardiology | Admitting: Cardiology

## 2015-09-06 DIAGNOSIS — Z955 Presence of coronary angioplasty implant and graft: Secondary | ICD-10-CM | POA: Insufficient documentation

## 2015-09-06 NOTE — Progress Notes (Signed)
Cardiac Rehab Medication Review by a Pharmacist  Does the patient  feel that his/her medications are working for him/her?  yes  Has the patient been experiencing any side effects to the medications prescribed?  no  Does the patient measure his/her own blood pressure or blood glucose at home?  yes   Does the patient have any problems obtaining medications due to transportation or finances?   no  Understanding of regimen: excellent Understanding of indications: excellent Potential of compliance: good  Pharmacist comments: Patient expressed no concerns during our conversation. Has good compliance and understands what each medications was for. Patient keeps a list of his medications in his wallet!  Darl Pikes, PharmD Clinical Pharmacist- Resident Pager: [phone]  Darl Pikes 09/06/2015 8:42 AM

## 2015-09-10 DIAGNOSIS — C61 Malignant neoplasm of prostate: Secondary | ICD-10-CM | POA: Diagnosis not present

## 2015-09-10 DIAGNOSIS — N35013 Post-traumatic anterior urethral stricture: Secondary | ICD-10-CM | POA: Diagnosis not present

## 2015-09-12 ENCOUNTER — Ambulatory Visit (HOSPITAL_COMMUNITY): Payer: Self-pay

## 2015-09-12 ENCOUNTER — Encounter (HOSPITAL_COMMUNITY)
Admission: RE | Admit: 2015-09-12 | Discharge: 2015-09-12 | Disposition: A | Discharge status: Home / Self Care | Payer: Medicare Other | Source: Ambulatory Visit | Attending: Cardiology | Admitting: Cardiology

## 2015-09-12 DIAGNOSIS — Z955 Presence of coronary angioplasty implant and graft: Secondary | ICD-10-CM | POA: Diagnosis not present

## 2015-09-12 NOTE — Progress Notes (Signed)
Pt started exercise today at the 11:15 cardiac rehab phase Ii program.  Pt tolerated light exercise without difficulty. VSS, telemetry-SR with sinus arrythmias, asymptomatic.  Medication list reconciled.  Pt verbalized compliance with medications and denies barriers to compliance. PSYCHOSOCIAL ASSESSMENT:  PHQ-.  Pt denies any depression or blue days.Pt exhibits positive coping skills, hopeful outlook with supportive family. No psychosocial needs identified at this time, no psychosocial interventions necessary.    Pt enjoys travel and computer.   Pt cardiac rehab  goal is for weight loss and improved muscle tone.  Pt encouraged to participate in home exercise, education classes particular nutrition to increase ability to achieve these goals.   Pt long term cardiac rehab goal is a weight of 230 pounds.  Pt presently weight 253 pounds. Pt feels this is achievable for him with consistency for exercise and adherence to heart healthy eating.  Will track pt weight with daily weights on exercise days. Pt oriented to exercise equipment and routine.  Understanding verbalized.  Cherre Huger, BSN

## 2015-09-13 DIAGNOSIS — H2513 Age-related nuclear cataract, bilateral: Secondary | ICD-10-CM | POA: Diagnosis not present

## 2015-09-13 DIAGNOSIS — H31092 Other chorioretinal scars, left eye: Secondary | ICD-10-CM | POA: Diagnosis not present

## 2015-09-14 ENCOUNTER — Ambulatory Visit (HOSPITAL_COMMUNITY): Payer: Self-pay

## 2015-09-14 ENCOUNTER — Encounter (HOSPITAL_COMMUNITY): Payer: Medicare Other

## 2015-09-17 ENCOUNTER — Encounter (HOSPITAL_COMMUNITY)
Admission: RE | Admit: 2015-09-17 | Discharge: 2015-09-17 | Disposition: A | Discharge status: Home / Self Care | Payer: Medicare Other | Source: Ambulatory Visit | Attending: Cardiology | Admitting: Cardiology

## 2015-09-17 ENCOUNTER — Ambulatory Visit (HOSPITAL_COMMUNITY): Payer: Self-pay

## 2015-09-17 DIAGNOSIS — Z955 Presence of coronary angioplasty implant and graft: Secondary | ICD-10-CM | POA: Diagnosis not present

## 2015-09-18 ENCOUNTER — Encounter: Payer: Self-pay | Admitting: Cardiology

## 2015-09-19 ENCOUNTER — Encounter (HOSPITAL_COMMUNITY)
Admission: RE | Admit: 2015-09-19 | Discharge: 2015-09-19 | Disposition: A | Discharge status: Home / Self Care | Payer: Medicare Other | Source: Ambulatory Visit | Attending: Cardiology | Admitting: Cardiology

## 2015-09-19 ENCOUNTER — Ambulatory Visit (HOSPITAL_COMMUNITY): Payer: Self-pay

## 2015-09-19 DIAGNOSIS — Z23 Encounter for immunization: Secondary | ICD-10-CM | POA: Diagnosis not present

## 2015-09-19 DIAGNOSIS — Z954 Presence of other heart-valve replacement: Secondary | ICD-10-CM | POA: Insufficient documentation

## 2015-09-21 ENCOUNTER — Ambulatory Visit (HOSPITAL_COMMUNITY): Payer: Self-pay

## 2015-09-21 ENCOUNTER — Encounter (HOSPITAL_COMMUNITY)
Admission: RE | Admit: 2015-09-21 | Discharge: 2015-09-21 | Disposition: A | Discharge status: Home / Self Care | Payer: Medicare Other | Source: Ambulatory Visit | Attending: Cardiology | Admitting: Cardiology

## 2015-09-21 DIAGNOSIS — Z954 Presence of other heart-valve replacement: Secondary | ICD-10-CM | POA: Diagnosis not present

## 2015-09-24 ENCOUNTER — Ambulatory Visit (HOSPITAL_COMMUNITY): Payer: Self-pay

## 2015-09-24 ENCOUNTER — Telehealth (HOSPITAL_COMMUNITY): Payer: Self-pay | Admitting: Internal Medicine

## 2015-09-24 ENCOUNTER — Encounter (HOSPITAL_COMMUNITY): Payer: Medicare Other

## 2015-09-26 ENCOUNTER — Encounter (HOSPITAL_COMMUNITY): Payer: Medicare Other

## 2015-09-26 ENCOUNTER — Ambulatory Visit (HOSPITAL_COMMUNITY): Payer: Self-pay

## 2015-09-28 ENCOUNTER — Encounter (HOSPITAL_COMMUNITY)
Admission: RE | Admit: 2015-09-28 | Discharge: 2015-09-28 | Disposition: A | Discharge status: Home / Self Care | Payer: Medicare Other | Source: Ambulatory Visit | Attending: Cardiology | Admitting: Cardiology

## 2015-09-28 ENCOUNTER — Ambulatory Visit (HOSPITAL_COMMUNITY): Payer: Self-pay

## 2015-09-28 DIAGNOSIS — Z954 Presence of other heart-valve replacement: Secondary | ICD-10-CM | POA: Diagnosis not present

## 2015-09-28 NOTE — Progress Notes (Signed)
Reviewed home exercise with pt today.  Pt plans to walk some and continue with AHOY classes at [facility] center for exercise.  Reviewed THR, pulse, RPE, sign and symptoms, and when to call 911 or MD.  Pt voiced understanding. Alberteen Sam, MA, ACSM RCEP

## 2015-10-01 ENCOUNTER — Ambulatory Visit (INDEPENDENT_AMBULATORY_CARE_PROVIDER_SITE_OTHER): Payer: Medicare Other | Admitting: Internal Medicine

## 2015-10-01 ENCOUNTER — Encounter: Payer: Self-pay | Admitting: Internal Medicine

## 2015-10-01 ENCOUNTER — Encounter (HOSPITAL_COMMUNITY)
Admission: RE | Admit: 2015-10-01 | Discharge: 2015-10-01 | Disposition: A | Discharge status: Home / Self Care | Payer: Medicare Other | Source: Ambulatory Visit | Attending: Cardiology | Admitting: Cardiology

## 2015-10-01 ENCOUNTER — Ambulatory Visit (HOSPITAL_COMMUNITY): Payer: Self-pay

## 2015-10-01 VITALS — BP 112/66 | HR 56 | Ht 71.0 in | Wt 225.4 lb

## 2015-10-01 DIAGNOSIS — I350 Nonrheumatic aortic (valve) stenosis: Secondary | ICD-10-CM

## 2015-10-01 DIAGNOSIS — G4733 Obstructive sleep apnea (adult) (pediatric): Secondary | ICD-10-CM

## 2015-10-01 DIAGNOSIS — I6523 Occlusion and stenosis of bilateral carotid arteries: Secondary | ICD-10-CM

## 2015-10-01 DIAGNOSIS — R918 Other nonspecific abnormal finding of lung field: Secondary | ICD-10-CM

## 2015-10-01 DIAGNOSIS — Z954 Presence of other heart-valve replacement: Secondary | ICD-10-CM | POA: Diagnosis not present

## 2015-10-01 NOTE — Assessment & Plan Note (Signed)
S/p TAVR at [facility] 2016, without reported complication

## 2015-10-01 NOTE — Assessment & Plan Note (Signed)
He describes good compliance and control still set to 10. We need him to call us the name of his new DME company, which was changed because of Medicare.

## 2015-10-01 NOTE — Assessment & Plan Note (Signed)
CT chest at [facility] February 2016 reportedly measured nodules unchanged from prior studies at [facility]. Direct comparison report not available. This is approaching 2 year follow-up and we will probably able to do and occasional chest x-ray going forward as discussed with him.

## 2015-10-01 NOTE — Patient Instructions (Signed)
We can continue CPAP 10. We are working to track down which DME company you are using now for our records.  Please call if we can help.

## 2015-10-01 NOTE — Progress Notes (Signed)
Subjective:    Patient ID: Andre Miranda, male    DOB: March 09, 1936, 79 y.o.   MRN: [id number]  HPI 04/25/11- 34 yoM former smoker for re-establishment for sleep apnea. He really likes his CPAP which is set at 9 as of last visit here in 2006. We discussed available treatments. Original NPSG is available and will be scanned. Medical issues of OSA reviewed.   06/16/11- 74 yoM former smoker followed for OSA complicated by hx CAD/PAD, aortic stenosis Continues CPAP 10 [employer]. He uses new machine all night every night, with nasal pillows mask.  Breathing is ok otherwise. Download chip gets picked up tomorrow. He missed only one night when he had some congestion.   06/15/12- 74 yoM former smoker followed for OSA complicated by hx CAD/PAD, aortic stenosis Wears CPAP every night for approx 6 hours; pressure working well for patient and no supplies needed at this time. CPAP 10/ [employer] - "loves it". He brings CT scan report from [facility] dated January 28. CT of the abdomen description of lower chest including small hiatal hernia, coronary artery and aortic valvular calcification with old sternotomy. Scattered calcified granulomas throughout the lungs He had smoked 2 packs per day for 15 years. CABG four-vessel 2003.  08/17/12- 75 yoM former smoker followed for OSA complicated by hx CAD/PAD, aortic stenosis Wears CPAP 10/ [employer] every night for about 6-7 hours; review PFT results with patient. Lung nodule discussed-old granuloma based on CT scan. Recent cold treated by his primary physician and doing better. He will get his flu shot from his primary physician. PFT 06/29/2012-mild obstructive airways disease and small airways with response to bronchodilator. Normal lung volumes and diffusion. FEV13.10/106%, FEV1/FVC 0.79, FEF 25-75% 3.04/118% with 72% response to bronchodilator.  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 Previous cough resolved. He has no acute concerns but mentions back ache.  [facility] is watching is stable lung nodule on CT  11/112/15- 76 yoM former smoker followed for OSA complicated by hx CAD/PAD, aortic stenosis  Subjective:    Patient ID: Andre Miranda, male    DOB: 07-May-1936, 79 y.o.   MRN: [id number]  HPI 04/25/11- 26 yoM former smoker for re-establishment for sleep apnea. He really likes his CPAP which is set at 9 as of last visit here in 2006. We discussed available treatments. Original NPSG is available and will be scanned. Medical issues of OSA reviewed.   06/16/11- 74 yoM former smoker followed for OSA complicated by hx CAD/PAD, aortic stenosis Continues CPAP 10 [employer]. He uses new machine all night every night, with nasal pillows mask.  Breathing is ok otherwise. Download chip gets picked up tomorrow. He missed only one night when he had some congestion.   06/15/12- 74 yoM former smoker followed for OSA complicated by hx CAD/PAD, aortic stenosis Wears CPAP every night for approx 6 hours; pressure working well for patient and no supplies needed at this time. CPAP 10/ [employer] - "loves it". He brings CT scan report from [facility] dated January 28. CT of the abdomen description of lower chest including small hiatal hernia, coronary artery and aortic valvular calcification with old sternotomy. Scattered calcified granulomas throughout the lungs He had smoked 2 packs per day for 15 years. CABG four-vessel 2003.  08/17/12- 75 yoM former smoker followed  for OSA complicated by hx CAD/PAD, aortic stenosis Wears CPAP 10/ [employer] every night for about 6-7 hours; review PFT results with patient. Lung nodule discussed-old granuloma based on CT scan. Recent cold treated by his primary physician and doing better. He will get his flu shot from his primary physician. PFT 06/29/2012-mild  obstructive airways disease and small airways with response to bronchodilator. Normal lung volumes and diffusion. FEV13.10/106%, FEV1/FVC 0.79, FEF 25-75% 3.04/118% with 72% response to bronchodilator.  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 Previous cough resolved. He has no acute concerns but mentions back ache.  [facility] is watching is stable lung nodule on CT  09/28/14- 76 yoM former smoker followed for OSA complicated by hx CAD/CABG/PAD, aortic stenosis, lung nodules CPAP 10/ [employer]    Had flu vaccine FOLLOW FOR:  OSA; Pt will bring card for download, could not obtain download; Pt states equipment is working well; wears CPAP every night approx 5-6 hours nightly [facility] is watching is stable lung nodules on CT Was off Ramipril for 2 months with no effect on chronic cough so he went back because of cost.  he wears his CPAP mask routinely unless he has a head cold with nasal congestion. New complaint of steady, nonpleuritic soreness right lateral chest for past few weeks. No sudden event or trauma.  10/01/15-79 year old male former smoker followed for OSA complicated by history CAD/CABG/PAD, AS/TAVR, lung nodules CPAP 10/ DME company changed because of Medicare to ? FOLLOWS FOR: Pt continues to wear CPAP every night for about 6 hours; Changed DME compaines due to Insurance coverage. No recent DL's. Had TAVR aortic valve replacement at [facility] without problems. CT chest done for that. By reported measurement, he says chronic lung nodules were unchanged from prior CTs done at [facility]. Denies cough or respiratory change. CT chest/abd- 01/08/15-[facility]- Severe CAD, Aortic valve calcification  ROS-see HPI Constitutional:   No-   weight loss, night sweats, fevers, chills, fatigue,  lassitude. HEENT:   No-  headaches, difficulty swallowing, tooth/dental problems, sore throat,       No-  sneezing, itching, ear ache, nasal congestion, post nasal drip,  CV:  +chest pain,  No-orthopnea, PND, swelling in lower extremities, anasarca,   dizziness, palpitations Resp: No-   shortness of breath with exertion or at rest.              No-   productive cough,  + non-productive cough,  No- coughing up of blood.              No-   change in color of mucus.  No- wheezing.   Skin: No-   rash or lesions. GI:  No-   heartburn, indigestion, abdominal pain, nausea, vomiting,  GU:  MS:  No-   joint pain or swelling.  . Neuro-     nothing unusual Psych:  No- change in mood or affect. No depression or anxiety.  No memory loss.  Objective:   Physical Exam General- Alert, Oriented, Affect-appropriate, Distress- none acute  + Overweight Skin- rash-none, lesions- none, excoriation- none Lymphadenopathy- none Head- atraumatic Eyes- Gross vision intact, PERRLA, conjunctivae clear, secretions Ears- Hearing, canals, Tm - normal Nose- Clear, No-Septal dev, mucus, polyps, erosion, perforation Mild external deviation Throat- Mallampati II-III , mucosa clear , drainage- none, tonsils-  atrophic Neck- flexible , trachea midline, no stridor , thyroid nl, carotid no bruit   Thick neck Chest - symmetrical excursion , unlabored  Heart/CV- RRR , murmur-none heard , no gallop  , no rub, nl s1 s2  - JVD- none , edema- none, stasis changes- none, varices- none  Lung- clear to P&A, wheeze- none, cough-+ minor dry , dullness-none, rub- none   Chest wall- Abd-  Br/ Gen/ Rectal- Not done, not indicated Extrem- cyanosis- none, clubbing, none, atrophy- none, strength- nl Neuro- grossly intact to observation  Assessment & Plan:

## 2015-10-03 ENCOUNTER — Ambulatory Visit (HOSPITAL_COMMUNITY): Payer: Self-pay

## 2015-10-03 ENCOUNTER — Encounter (HOSPITAL_COMMUNITY)
Admission: RE | Admit: 2015-10-03 | Discharge: 2015-10-03 | Disposition: A | Discharge status: Home / Self Care | Payer: Medicare Other | Source: Ambulatory Visit | Attending: Cardiology | Admitting: Cardiology

## 2015-10-03 ENCOUNTER — Telehealth: Payer: Self-pay | Admitting: Internal Medicine

## 2015-10-03 DIAGNOSIS — G4733 Obstructive sleep apnea (adult) (pediatric): Secondary | ICD-10-CM

## 2015-10-03 DIAGNOSIS — Z954 Presence of other heart-valve replacement: Secondary | ICD-10-CM | POA: Diagnosis not present

## 2015-10-03 NOTE — Telephone Encounter (Signed)
Pt cb. [phone], states he uses [employer].

## 2015-10-03 NOTE — Telephone Encounter (Signed)
Yes please, and document the DME in snap shot so we can find it again

## 2015-10-03 NOTE — Telephone Encounter (Signed)
Thanks fr taking the information down for me. CY would you like for me to place order to get download on patient? Thanks

## 2015-10-03 NOTE — Telephone Encounter (Signed)
Pt will ask to speak with Katie. Thanks.

## 2015-10-03 NOTE — Telephone Encounter (Signed)
Order has been placed and snap shot has been updated.

## 2015-10-05 ENCOUNTER — Encounter (HOSPITAL_COMMUNITY)
Admission: RE | Admit: 2015-10-05 | Discharge: 2015-10-05 | Disposition: A | Discharge status: Home / Self Care | Payer: Medicare Other | Source: Ambulatory Visit | Attending: Cardiology | Admitting: Cardiology

## 2015-10-05 ENCOUNTER — Ambulatory Visit (HOSPITAL_COMMUNITY): Payer: Self-pay

## 2015-10-05 DIAGNOSIS — Z954 Presence of other heart-valve replacement: Secondary | ICD-10-CM | POA: Diagnosis not present

## 2015-10-08 ENCOUNTER — Encounter (HOSPITAL_COMMUNITY)
Admission: RE | Admit: 2015-10-08 | Discharge: 2015-10-08 | Disposition: A | Discharge status: Home / Self Care | Payer: Medicare Other | Source: Ambulatory Visit | Attending: Cardiology | Admitting: Cardiology

## 2015-10-08 ENCOUNTER — Ambulatory Visit (HOSPITAL_COMMUNITY): Payer: Self-pay

## 2015-10-08 DIAGNOSIS — Z954 Presence of other heart-valve replacement: Secondary | ICD-10-CM | POA: Diagnosis not present

## 2015-10-10 ENCOUNTER — Ambulatory Visit (HOSPITAL_COMMUNITY): Payer: Self-pay

## 2015-10-10 ENCOUNTER — Encounter (HOSPITAL_COMMUNITY)
Admission: RE | Admit: 2015-10-10 | Discharge: 2015-10-10 | Disposition: A | Discharge status: Home / Self Care | Payer: Medicare Other | Source: Ambulatory Visit | Attending: Cardiology | Admitting: Cardiology

## 2015-10-10 DIAGNOSIS — Z954 Presence of other heart-valve replacement: Secondary | ICD-10-CM | POA: Diagnosis not present

## 2015-10-12 ENCOUNTER — Encounter (HOSPITAL_COMMUNITY): Payer: Medicare Other

## 2015-10-15 ENCOUNTER — Encounter (HOSPITAL_COMMUNITY)
Admission: RE | Admit: 2015-10-15 | Discharge: 2015-10-15 | Disposition: A | Discharge status: Home / Self Care | Payer: Medicare Other | Source: Ambulatory Visit | Attending: Cardiology | Admitting: Cardiology

## 2015-10-15 ENCOUNTER — Ambulatory Visit (HOSPITAL_COMMUNITY): Payer: Self-pay

## 2015-10-15 DIAGNOSIS — Z954 Presence of other heart-valve replacement: Secondary | ICD-10-CM | POA: Diagnosis not present

## 2015-10-15 NOTE — Progress Notes (Signed)
Andre Miranda 79 y.o. male Nutrition Note Spoke with pt. Nutrition Plan and Nutrition Survey goals reviewed with pt. Pt is not following the Therapeutic Lifestyle Changes diet. Pt states he follows an "Atkins-style diet" and he is "a peculiar eater." This Probation officer encouraged to follow a [employer]; skipping phase 1 of the diet. Pt willing to consider changing his diet. Pt wants to lose wt. Wt loss tips briefly reviewed. Pt feels his barrier to wt loss is "not applying the knowledge I have." Pt with dx of CHF. Per discussion, pt unable to avoid high sodium foods due to frequency of eating out. Pt expressed understanding of the information reviewed. Pt aware of nutrition education classes offered and is unable to attend nutrition classes.  No results found for: HGBA1C  Nutrition Diagnosis ? Food-and nutrition-related knowledge deficit related to lack of exposure to information as related to diagnosis of: ? CVD ? Obesity related to excessive energy intake as evidenced by a BMI of 36.9  Nutrition RX/ Estimated Daily Nutrition Needs for: wt loss 1650-2150 Kcal, 45-60 gm fat, 10-14 gm sat fat, 1.6-2.1 gm trans-fat, <1500 mg sodium  Nutrition Intervention ? Pt's individual nutrition plan reviewed with pt. ? Benefits of adopting Therapeutic Lifestyle Changes discussed when Medficts reviewed. ? Pt to attend the Portion Distortion class  ? Pt given handouts for: ? Nutrition I class ? Nutrition II class ? Continue client-centered nutrition education by RD, as part of interdisciplinary care. Goal(s) ? Pt to identify and limit food sources of saturated fat, trans fat, and cholesterol ? Pt to identify food quantities necessary to achieve: ? wt loss to a goal wt of 229-247 lb (104.2-112.4 kg) at graduation from cardiac rehab.  Monitor and Evaluate progress toward nutrition goal with team. Nutrition Risk: Change to Moderate Derek Mound, M.Ed, RD, LDN, CDE 10/15/2015 3:43 PM

## 2015-10-17 ENCOUNTER — Encounter (HOSPITAL_COMMUNITY)
Admission: RE | Admit: 2015-10-17 | Discharge: 2015-10-17 | Disposition: A | Discharge status: Home / Self Care | Payer: Medicare Other | Source: Ambulatory Visit | Attending: Cardiology | Admitting: Cardiology

## 2015-10-17 ENCOUNTER — Ambulatory Visit (HOSPITAL_COMMUNITY): Payer: Self-pay

## 2015-10-17 DIAGNOSIS — Z954 Presence of other heart-valve replacement: Secondary | ICD-10-CM | POA: Diagnosis not present

## 2015-10-19 ENCOUNTER — Ambulatory Visit (HOSPITAL_COMMUNITY): Payer: Self-pay

## 2015-10-19 ENCOUNTER — Encounter (HOSPITAL_COMMUNITY)
Admission: RE | Admit: 2015-10-19 | Discharge: 2015-10-19 | Disposition: A | Discharge status: Home / Self Care | Payer: Medicare Other | Source: Ambulatory Visit | Attending: Cardiology | Admitting: Cardiology

## 2015-10-19 DIAGNOSIS — Z954 Presence of other heart-valve replacement: Secondary | ICD-10-CM | POA: Diagnosis not present

## 2015-10-22 ENCOUNTER — Encounter (HOSPITAL_COMMUNITY)
Admission: RE | Admit: 2015-10-22 | Discharge: 2015-10-22 | Disposition: A | Discharge status: Home / Self Care | Payer: Medicare Other | Source: Ambulatory Visit | Attending: Cardiology | Admitting: Cardiology

## 2015-10-22 ENCOUNTER — Ambulatory Visit (HOSPITAL_COMMUNITY): Payer: Self-pay

## 2015-10-22 DIAGNOSIS — Z954 Presence of other heart-valve replacement: Secondary | ICD-10-CM | POA: Diagnosis not present

## 2015-10-24 ENCOUNTER — Ambulatory Visit (HOSPITAL_COMMUNITY): Payer: Self-pay

## 2015-10-24 ENCOUNTER — Encounter (HOSPITAL_COMMUNITY)
Admission: RE | Admit: 2015-10-24 | Discharge: 2015-10-24 | Disposition: A | Discharge status: Home / Self Care | Payer: Medicare Other | Source: Ambulatory Visit | Attending: Cardiology | Admitting: Cardiology

## 2015-10-24 DIAGNOSIS — Z954 Presence of other heart-valve replacement: Secondary | ICD-10-CM | POA: Diagnosis not present

## 2015-10-26 ENCOUNTER — Ambulatory Visit (HOSPITAL_COMMUNITY): Payer: Self-pay

## 2015-10-26 ENCOUNTER — Encounter (HOSPITAL_COMMUNITY)
Admission: RE | Admit: 2015-10-26 | Discharge: 2015-10-26 | Disposition: A | Discharge status: Home / Self Care | Payer: Medicare Other | Source: Ambulatory Visit | Attending: Cardiology | Admitting: Cardiology

## 2015-10-26 DIAGNOSIS — Z954 Presence of other heart-valve replacement: Secondary | ICD-10-CM | POA: Diagnosis not present

## 2015-10-29 ENCOUNTER — Ambulatory Visit (HOSPITAL_COMMUNITY): Payer: Self-pay

## 2015-10-29 ENCOUNTER — Encounter (HOSPITAL_COMMUNITY)
Admission: RE | Admit: 2015-10-29 | Discharge: 2015-10-29 | Disposition: A | Discharge status: Home / Self Care | Payer: Medicare Other | Source: Ambulatory Visit | Attending: Cardiology | Admitting: Cardiology

## 2015-10-29 DIAGNOSIS — Z954 Presence of other heart-valve replacement: Secondary | ICD-10-CM | POA: Diagnosis not present

## 2015-10-31 ENCOUNTER — Encounter (HOSPITAL_COMMUNITY)
Admission: RE | Admit: 2015-10-31 | Discharge: 2015-10-31 | Disposition: A | Discharge status: Home / Self Care | Payer: Medicare Other | Source: Ambulatory Visit | Attending: Cardiology | Admitting: Cardiology

## 2015-10-31 ENCOUNTER — Ambulatory Visit (HOSPITAL_COMMUNITY): Payer: Self-pay

## 2015-10-31 DIAGNOSIS — Z954 Presence of other heart-valve replacement: Secondary | ICD-10-CM | POA: Diagnosis not present

## 2015-10-31 NOTE — Progress Notes (Signed)
QUALITY OF LIFE SCORE REVIEW Pt completed Quality of Life survey as a participant in Cardiac Rehab. Scores 21.0 or below are considered low. Overall 24.4, Health and Function 24.37, socioeconomic 24.0, physiological and spiritual 24.43, family 25.2. Pt scores are well above the threshold of 21.0 No needs identified, no further intervention is needed. Will continue to monitor and intervene as necessary. Cherre Huger, BSN

## 2015-11-02 ENCOUNTER — Encounter (HOSPITAL_COMMUNITY): Payer: Medicare Other

## 2015-11-02 ENCOUNTER — Ambulatory Visit (HOSPITAL_COMMUNITY): Payer: Self-pay

## 2015-11-05 ENCOUNTER — Ambulatory Visit (HOSPITAL_COMMUNITY): Payer: Self-pay

## 2015-11-05 ENCOUNTER — Telehealth (HOSPITAL_COMMUNITY): Payer: Self-pay | Admitting: Internal Medicine

## 2015-11-05 ENCOUNTER — Encounter (HOSPITAL_COMMUNITY): Payer: Medicare Other

## 2015-11-06 ENCOUNTER — Encounter: Payer: Self-pay | Admitting: Internal Medicine

## 2015-11-07 ENCOUNTER — Ambulatory Visit (HOSPITAL_COMMUNITY): Payer: Self-pay

## 2015-11-07 ENCOUNTER — Encounter (HOSPITAL_COMMUNITY)
Admission: RE | Admit: 2015-11-07 | Discharge: 2015-11-07 | Disposition: A | Discharge status: Home / Self Care | Payer: Medicare Other | Source: Ambulatory Visit | Attending: Cardiology | Admitting: Cardiology

## 2015-11-07 DIAGNOSIS — Z954 Presence of other heart-valve replacement: Secondary | ICD-10-CM | POA: Diagnosis not present

## 2015-11-09 ENCOUNTER — Encounter (HOSPITAL_COMMUNITY)
Admission: RE | Admit: 2015-11-09 | Discharge: 2015-11-09 | Disposition: A | Discharge status: Home / Self Care | Payer: Medicare Other | Source: Ambulatory Visit | Attending: Cardiology | Admitting: Cardiology

## 2015-11-09 ENCOUNTER — Ambulatory Visit (HOSPITAL_COMMUNITY): Payer: Self-pay

## 2015-11-09 DIAGNOSIS — Z954 Presence of other heart-valve replacement: Secondary | ICD-10-CM | POA: Diagnosis not present

## 2015-11-12 ENCOUNTER — Encounter (HOSPITAL_COMMUNITY): Payer: Medicare Other

## 2015-11-14 ENCOUNTER — Encounter (HOSPITAL_COMMUNITY)
Admission: RE | Admit: 2015-11-14 | Discharge: 2015-11-14 | Disposition: A | Discharge status: Home / Self Care | Payer: Medicare Other | Source: Ambulatory Visit | Attending: Cardiology | Admitting: Cardiology

## 2015-11-14 ENCOUNTER — Ambulatory Visit (HOSPITAL_COMMUNITY): Payer: Self-pay

## 2015-11-14 DIAGNOSIS — Z954 Presence of other heart-valve replacement: Secondary | ICD-10-CM | POA: Diagnosis not present

## 2015-11-16 ENCOUNTER — Encounter (HOSPITAL_COMMUNITY): Payer: Medicare Other

## 2015-11-16 ENCOUNTER — Ambulatory Visit (HOSPITAL_COMMUNITY): Payer: Self-pay

## 2015-11-16 ENCOUNTER — Telehealth (HOSPITAL_COMMUNITY): Payer: Self-pay | Admitting: Internal Medicine

## 2015-11-19 ENCOUNTER — Encounter (HOSPITAL_COMMUNITY): Payer: Medicare Other

## 2015-11-19 ENCOUNTER — Ambulatory Visit (HOSPITAL_COMMUNITY): Payer: Self-pay

## 2015-11-19 DIAGNOSIS — Z954 Presence of other heart-valve replacement: Secondary | ICD-10-CM | POA: Insufficient documentation

## 2015-11-21 ENCOUNTER — Encounter (HOSPITAL_COMMUNITY)
Admission: RE | Admit: 2015-11-21 | Discharge: 2015-11-21 | Disposition: A | Discharge status: Home / Self Care | Payer: Medicare Other | Source: Ambulatory Visit | Attending: Cardiology | Admitting: Cardiology

## 2015-11-21 ENCOUNTER — Ambulatory Visit (HOSPITAL_COMMUNITY): Payer: Self-pay

## 2015-11-21 DIAGNOSIS — Z954 Presence of other heart-valve replacement: Secondary | ICD-10-CM | POA: Diagnosis not present

## 2015-11-23 ENCOUNTER — Ambulatory Visit (HOSPITAL_COMMUNITY): Payer: Self-pay

## 2015-11-23 ENCOUNTER — Encounter (HOSPITAL_COMMUNITY)
Admission: RE | Admit: 2015-11-23 | Discharge: 2015-11-23 | Disposition: A | Discharge status: Home / Self Care | Payer: Medicare Other | Source: Ambulatory Visit | Attending: Cardiology | Admitting: Cardiology

## 2015-11-23 DIAGNOSIS — Z954 Presence of other heart-valve replacement: Secondary | ICD-10-CM | POA: Diagnosis not present

## 2015-11-26 ENCOUNTER — Encounter (HOSPITAL_COMMUNITY): Payer: Medicare Other

## 2015-11-26 ENCOUNTER — Ambulatory Visit (HOSPITAL_COMMUNITY): Payer: Self-pay

## 2015-11-28 ENCOUNTER — Encounter (HOSPITAL_COMMUNITY)
Admission: RE | Admit: 2015-11-28 | Discharge: 2015-11-28 | Disposition: A | Discharge status: Home / Self Care | Payer: Medicare Other | Source: Ambulatory Visit | Attending: Cardiology | Admitting: Cardiology

## 2015-11-28 ENCOUNTER — Ambulatory Visit (HOSPITAL_COMMUNITY): Payer: Self-pay

## 2015-11-28 DIAGNOSIS — Z954 Presence of other heart-valve replacement: Secondary | ICD-10-CM | POA: Diagnosis not present

## 2015-11-30 ENCOUNTER — Encounter (HOSPITAL_COMMUNITY)
Admission: RE | Admit: 2015-11-30 | Discharge: 2015-11-30 | Disposition: A | Discharge status: Home / Self Care | Payer: Medicare Other | Source: Ambulatory Visit | Attending: Cardiology | Admitting: Cardiology

## 2015-11-30 ENCOUNTER — Ambulatory Visit (HOSPITAL_COMMUNITY): Payer: Self-pay

## 2015-11-30 DIAGNOSIS — Z954 Presence of other heart-valve replacement: Secondary | ICD-10-CM | POA: Diagnosis not present

## 2015-12-03 ENCOUNTER — Ambulatory Visit (HOSPITAL_COMMUNITY): Payer: Self-pay

## 2015-12-03 ENCOUNTER — Encounter (HOSPITAL_COMMUNITY): Payer: Medicare Other

## 2015-12-05 ENCOUNTER — Ambulatory Visit (HOSPITAL_COMMUNITY): Payer: Self-pay

## 2015-12-05 ENCOUNTER — Encounter (HOSPITAL_COMMUNITY): Admission: RE | Admit: 2015-12-05 | Payer: Medicare Other | Source: Ambulatory Visit

## 2015-12-07 ENCOUNTER — Encounter (HOSPITAL_COMMUNITY): Payer: Medicare Other

## 2015-12-07 ENCOUNTER — Ambulatory Visit (HOSPITAL_COMMUNITY): Payer: Self-pay

## 2015-12-10 ENCOUNTER — Encounter (HOSPITAL_COMMUNITY)
Admission: RE | Admit: 2015-12-10 | Discharge: 2015-12-10 | Disposition: A | Discharge status: Home / Self Care | Payer: Medicare Other | Source: Ambulatory Visit | Attending: Cardiology | Admitting: Cardiology

## 2015-12-10 ENCOUNTER — Encounter (HOSPITAL_COMMUNITY): Payer: Medicare Other

## 2015-12-10 ENCOUNTER — Ambulatory Visit (HOSPITAL_COMMUNITY): Payer: Self-pay

## 2015-12-10 DIAGNOSIS — Z954 Presence of other heart-valve replacement: Secondary | ICD-10-CM | POA: Diagnosis not present

## 2015-12-12 ENCOUNTER — Ambulatory Visit (HOSPITAL_COMMUNITY): Payer: Self-pay

## 2015-12-12 ENCOUNTER — Encounter (HOSPITAL_COMMUNITY)
Admission: RE | Admit: 2015-12-12 | Discharge: 2015-12-12 | Disposition: A | Discharge status: Home / Self Care | Payer: Medicare Other | Source: Ambulatory Visit | Attending: Cardiology | Admitting: Cardiology

## 2015-12-12 DIAGNOSIS — Z954 Presence of other heart-valve replacement: Secondary | ICD-10-CM | POA: Diagnosis not present

## 2015-12-14 ENCOUNTER — Encounter (HOSPITAL_COMMUNITY)
Admission: RE | Admit: 2015-12-14 | Discharge: 2015-12-14 | Disposition: A | Discharge status: Home / Self Care | Payer: Medicare Other | Source: Ambulatory Visit | Attending: Cardiology | Admitting: Cardiology

## 2015-12-14 ENCOUNTER — Ambulatory Visit (HOSPITAL_COMMUNITY): Payer: Self-pay

## 2015-12-14 DIAGNOSIS — Z954 Presence of other heart-valve replacement: Secondary | ICD-10-CM | POA: Diagnosis not present

## 2015-12-17 ENCOUNTER — Telehealth (HOSPITAL_COMMUNITY): Payer: Self-pay | Admitting: *Deleted

## 2015-12-17 ENCOUNTER — Ambulatory Visit (HOSPITAL_COMMUNITY): Payer: Self-pay

## 2015-12-17 ENCOUNTER — Encounter (HOSPITAL_COMMUNITY): Admission: RE | Admit: 2015-12-17 | Payer: Medicare Other | Source: Ambulatory Visit

## 2015-12-19 ENCOUNTER — Encounter (HOSPITAL_COMMUNITY): Payer: Medicare Other

## 2015-12-21 ENCOUNTER — Encounter (HOSPITAL_COMMUNITY): Admission: RE | Admit: 2015-12-21 | Payer: Medicare Other | Source: Ambulatory Visit

## 2015-12-21 ENCOUNTER — Telehealth (HOSPITAL_COMMUNITY): Payer: Self-pay | Admitting: *Deleted

## 2015-12-24 ENCOUNTER — Encounter (HOSPITAL_COMMUNITY)
Admission: RE | Admit: 2015-12-24 | Discharge: 2015-12-24 | Disposition: A | Discharge status: Home / Self Care | Payer: Medicare Other | Source: Ambulatory Visit | Attending: Cardiology | Admitting: Cardiology

## 2015-12-24 DIAGNOSIS — Z954 Presence of other heart-valve replacement: Secondary | ICD-10-CM | POA: Diagnosis not present

## 2015-12-26 ENCOUNTER — Encounter (HOSPITAL_COMMUNITY)
Admission: RE | Admit: 2015-12-26 | Discharge: 2015-12-26 | Disposition: A | Discharge status: Home / Self Care | Payer: Medicare Other | Source: Ambulatory Visit | Attending: Cardiology | Admitting: Cardiology

## 2015-12-26 DIAGNOSIS — Z954 Presence of other heart-valve replacement: Secondary | ICD-10-CM | POA: Diagnosis not present

## 2015-12-28 ENCOUNTER — Encounter (HOSPITAL_COMMUNITY)
Admission: RE | Admit: 2015-12-28 | Discharge: 2015-12-28 | Disposition: A | Discharge status: Home / Self Care | Payer: Medicare Other | Source: Ambulatory Visit | Attending: Cardiology | Admitting: Cardiology

## 2015-12-28 DIAGNOSIS — Z954 Presence of other heart-valve replacement: Secondary | ICD-10-CM | POA: Diagnosis not present

## 2015-12-28 NOTE — Progress Notes (Signed)
Andre Miranda was noted to have increased PVC's today at cardiac rehab. Vital signs stable. Patient asymptomatic. Andre Miranda reported [place] some coffee [place] subway prior to coming to exercise at cardiac rehab. Andre Bhagat PA called and notified. Patient left cardiac rehab without complaints. Will continue to monitor the patient throughout  the program.

## 2015-12-31 ENCOUNTER — Encounter (HOSPITAL_COMMUNITY)
Admission: RE | Admit: 2015-12-31 | Discharge: 2015-12-31 | Disposition: A | Discharge status: Home / Self Care | Payer: Medicare Other | Source: Ambulatory Visit | Attending: Cardiology | Admitting: Cardiology

## 2015-12-31 DIAGNOSIS — Z954 Presence of other heart-valve replacement: Secondary | ICD-10-CM | POA: Diagnosis not present

## 2016-01-01 DIAGNOSIS — L821 Other seborrheic keratosis: Secondary | ICD-10-CM | POA: Diagnosis not present

## 2016-01-01 DIAGNOSIS — L812 Freckles: Secondary | ICD-10-CM | POA: Diagnosis not present

## 2016-01-02 ENCOUNTER — Encounter (HOSPITAL_COMMUNITY)
Admission: RE | Admit: 2016-01-02 | Discharge: 2016-01-02 | Disposition: A | Discharge status: Home / Self Care | Payer: Medicare Other | Source: Ambulatory Visit | Attending: Cardiology | Admitting: Cardiology

## 2016-01-02 DIAGNOSIS — Z954 Presence of other heart-valve replacement: Secondary | ICD-10-CM | POA: Diagnosis not present

## 2016-01-04 ENCOUNTER — Encounter (HOSPITAL_COMMUNITY)
Admission: RE | Admit: 2016-01-04 | Discharge: 2016-01-04 | Disposition: A | Discharge status: Home / Self Care | Payer: Medicare Other | Source: Ambulatory Visit | Attending: Cardiology | Admitting: Cardiology

## 2016-01-04 DIAGNOSIS — Z954 Presence of other heart-valve replacement: Secondary | ICD-10-CM | POA: Diagnosis not present

## 2016-01-07 ENCOUNTER — Encounter (HOSPITAL_COMMUNITY)
Admission: RE | Admit: 2016-01-07 | Discharge: 2016-01-07 | Disposition: A | Discharge status: Home / Self Care | Payer: Medicare Other | Source: Ambulatory Visit | Attending: Cardiology | Admitting: Cardiology

## 2016-01-07 DIAGNOSIS — Z954 Presence of other heart-valve replacement: Secondary | ICD-10-CM | POA: Diagnosis not present

## 2016-01-09 ENCOUNTER — Encounter (HOSPITAL_COMMUNITY): Payer: Medicare Other

## 2016-01-09 ENCOUNTER — Telehealth (HOSPITAL_COMMUNITY): Payer: Self-pay | Admitting: Internal Medicine

## 2016-01-11 ENCOUNTER — Encounter (HOSPITAL_COMMUNITY)
Admission: RE | Admit: 2016-01-11 | Discharge: 2016-01-11 | Disposition: A | Discharge status: Home / Self Care | Payer: Medicare Other | Source: Ambulatory Visit | Attending: Cardiology | Admitting: Cardiology

## 2016-01-11 DIAGNOSIS — Z954 Presence of other heart-valve replacement: Secondary | ICD-10-CM | POA: Diagnosis not present

## 2016-01-11 NOTE — Progress Notes (Signed)
Pt graduated from cardiac rehab program today with completion of 36 exercise sessions in Phase II. Pt maintained good attendance to both education and exercise.  Pt progressed nicely during his participation in rehab as evidenced by increased MET level. Pt MET level increased from 2.5 to 3.8.  Medication list reconciled. Repeat  PHQ score-0. Pt continues to exhibit positive and healthy coping skills.  Pt has supportive wife who recently has had medical issues that require patients attention.  Pt is happy to report that his wife is improving daily.  Pt has made significant lifestyle changes and should be commended for his success. Pt feels he has made significant progress toward achieving his goals during cardiac rehab. Pt has improved his muscle tone but has not made sustained progress toward weight loss.  Pt desired to weigh 230 pounds. Pt present weight today is 248.  Pt feels he has the tools to be successful.  Last couple of months because of his wife medical condition, they have been eating out and neighbors have prepared food to bring to the home.  Often the foods are not heart healthy.  Pt doesn't make this as an excuse, but feels this did not help him toward achieving his weight loss goal.   Pt plans to continue exercise with walking at list 5 days a week with the hopes of increasing this to 7 days a week.  It was indeed a true delight to have this patient in our cardiac rehab program. Maurice Small RN, BSN

## 2016-02-21 DIAGNOSIS — Z952 Presence of prosthetic heart valve: Secondary | ICD-10-CM | POA: Diagnosis not present

## 2016-02-21 DIAGNOSIS — I2510 Atherosclerotic heart disease of native coronary artery without angina pectoris: Secondary | ICD-10-CM | POA: Diagnosis not present

## 2016-03-10 DIAGNOSIS — N35013 Post-traumatic anterior urethral stricture: Secondary | ICD-10-CM | POA: Diagnosis not present

## 2016-03-10 DIAGNOSIS — C61 Malignant neoplasm of prostate: Secondary | ICD-10-CM | POA: Diagnosis not present

## 2016-03-31 DIAGNOSIS — C61 Malignant neoplasm of prostate: Secondary | ICD-10-CM | POA: Diagnosis not present

## 2016-03-31 DIAGNOSIS — N35013 Post-traumatic anterior urethral stricture: Secondary | ICD-10-CM | POA: Diagnosis not present

## 2016-04-09 ENCOUNTER — Encounter: Payer: Self-pay | Admitting: Family

## 2016-04-15 ENCOUNTER — Encounter: Payer: Self-pay | Admitting: Family

## 2016-04-15 ENCOUNTER — Ambulatory Visit (HOSPITAL_COMMUNITY)
Admission: RE | Admit: 2016-04-15 | Discharge: 2016-04-15 | Disposition: A | Discharge status: Home / Self Care | Payer: Medicare Other | Source: Ambulatory Visit | Attending: Family | Admitting: Family

## 2016-04-15 ENCOUNTER — Ambulatory Visit (INDEPENDENT_AMBULATORY_CARE_PROVIDER_SITE_OTHER): Payer: Medicare Other | Admitting: Family

## 2016-04-15 VITALS — BP 110/60 | HR 53 | Temp 97.2°F | Resp 16 | Ht 71.0 in | Wt 256.0 lb

## 2016-04-15 DIAGNOSIS — E785 Hyperlipidemia, unspecified: Secondary | ICD-10-CM | POA: Insufficient documentation

## 2016-04-15 DIAGNOSIS — I10 Essential (primary) hypertension: Secondary | ICD-10-CM | POA: Insufficient documentation

## 2016-04-15 DIAGNOSIS — Z9889 Other specified postprocedural states: Secondary | ICD-10-CM

## 2016-04-15 DIAGNOSIS — Z48812 Encounter for surgical aftercare following surgery on the circulatory system: Secondary | ICD-10-CM

## 2016-04-15 DIAGNOSIS — I6521 Occlusion and stenosis of right carotid artery: Secondary | ICD-10-CM | POA: Diagnosis not present

## 2016-04-15 NOTE — Patient Instructions (Signed)
Stroke Prevention Some medical conditions and behaviors are associated with an increased chance of having a stroke. You may prevent a stroke by making healthy choices and managing medical conditions. HOW CAN I REDUCE MY RISK OF HAVING A STROKE?   Stay physically active. Get at least 30 minutes of activity on most or all days.  Do not smoke. It may also be helpful to avoid exposure to secondhand smoke.  Limit alcohol use. Moderate alcohol use is considered to be:  No more than 2 drinks per day for men.  No more than 1 drink per day for nonpregnant women.  Eat healthy foods. This involves:  Eating 5 or more servings of fruits and vegetables a day.  Making dietary changes that address high blood pressure (hypertension), high cholesterol, diabetes, or obesity.  Manage your cholesterol levels.  Making food choices that are high in fiber and low in saturated fat, trans fat, and cholesterol may control cholesterol levels.  Take any prescribed medicines to control cholesterol as directed by your health care provider.  Manage your diabetes.  Controlling your carbohydrate and sugar intake is recommended to manage diabetes.  Take any prescribed medicines to control diabetes as directed by your health care provider.  Control your hypertension.  Making food choices that are low in salt (sodium), saturated fat, trans fat, and cholesterol is recommended to manage hypertension.  Ask your health care provider if you need treatment to lower your blood pressure. Take any prescribed medicines to control hypertension as directed by your health care provider.  If you are 18-39 years of age, have your blood pressure checked every 3-5 years. If you are 40 years of age or older, have your blood pressure checked every year.  Maintain a healthy weight.  Reducing calorie intake and making food choices that are low in sodium, saturated fat, trans fat, and cholesterol are recommended to manage  weight.  Stop drug abuse.  Avoid taking birth control pills.  Talk to your health care provider about the risks of taking birth control pills if you are over 35 years old, smoke, get migraines, or have ever had a blood clot.  Get evaluated for sleep disorders (sleep apnea).  Talk to your health care provider about getting a sleep evaluation if you snore a lot or have excessive sleepiness.  Take medicines only as directed by your health care provider.  For some people, aspirin or blood thinners (anticoagulants) are helpful in reducing the risk of forming abnormal blood clots that can lead to stroke. If you have the irregular heart rhythm of atrial fibrillation, you should be on a blood thinner unless there is a good reason you cannot take them.  Understand all your medicine instructions.  Make sure that other conditions (such as anemia or atherosclerosis) are addressed. SEEK IMMEDIATE MEDICAL CARE IF:   You have sudden weakness or numbness of the face, arm, or leg, especially on one side of the body.  Your face or eyelid droops to one side.  You have sudden confusion.  You have trouble speaking (aphasia) or understanding.  You have sudden trouble seeing in one or both eyes.  You have sudden trouble walking.  You have dizziness.  You have a loss of balance or coordination.  You have a sudden, severe headache with no known cause.  You have new chest pain or an irregular heartbeat. Any of these symptoms may represent a serious problem that is an emergency. Do not wait to see if the symptoms will   go away. Get medical help at once. Call your local emergency services (911 in [place] Do not drive yourself to the hospital.   This information is not intended to replace advice given to you by your health care provider. Make sure you discuss any questions you have with your health care provider.   Document Released: 12/11/2004 Document Revised: 11/24/2014 Document Reviewed:  05/06/2013 Elsevier Interactive Patient Education 2016 Elsevier Inc.  

## 2016-04-15 NOTE — Progress Notes (Signed)
Chief Complaint: Follow up Extracranial Carotid Artery Stenosis   History of Present Illness  Andre Miranda is a 80 y.o. male patient of Dr. Donnetta Hutching who is s/p right carotid endarterectomy by Dr. Donnetta Hutching in 2009 for severe asymptomatic carotid disease.  The patient denies any history of TIA or stroke symptoms, specifically the patient denies a history of amaurosis fugax or monocular blindness, denies a history unilateral of facial drooping, denies a history of hemiplegia, and denies a history of receptive or expressive aphasia.  He denies tingling, numbness, pain, or cold feeling in either upper extremity.  The patient reports New Medical or Surgical History: aortic valve replaced March 2016 for asymptomatic aortic valve stenosis. Dr. Loralie Champagne is his cardiologist.  Pt Diabetic: no Pt smoker: former smoker, quit in 1972  Pt meds include: Statin : yes ASA: yes Other anticoagulants/antiplatelets: Plavix for 3 months post aortic valve relplacement    Past Medical History  Diagnosis Date  . Hypertension   . Obesity   . Dysuria   . Hematuria   . Hyperlipidemia   . Aortic stenosis     MODERATE  . Sleep apnea   . Gout   . Carotid arterial disease ([place])     Social History Social History  Substance Use Topics  . Smoking status: Former Smoker -- 1.50 packs/day for 7 years    Types: Cigarettes    Quit date: 04/03/1971  . Smokeless tobacco: Never Used  . Alcohol Use: Yes    Family History Family History  Problem Relation Age of Onset  . Heart attack Father   . Pneumonia Mother 8    Surgical History Past Surgical History  Procedure Laterality Date  . Carotid endarterectomy  02/2008  . Cholecystectomy  04/2002  . US echocardiography  08/2009    SHOWED A MEAN GRADIENT ACROSS IS AORTIC VALVE OF 24 MM OF MERCURY. HE HAD MODERATE LVH AND NORMAL LV FUNCTION  . Coronary artery bypass graft  2003    LIMA TO THE LAD, RIMA TO THE RCA, AND A SAPHENOUS VEIN GRAFT TO THE  INTERMEDIATE AND DISTAL LEFT CIRCUMFLEX  . Aortic valve repair  February 06, 2015    Dr. Aline Brochure and Dr. Ysidro Evert  at [place] [facility]  . Ibuprofen Hypertension  . Penicillin G Other (See Comments)    Pt. Does not want to take this medication because of the reaction hie Father had; it almost killed his Dad. Has tolerate PCN before    Current Outpatient Prescriptions  Medication Sig Dispense Refill  . allopurinol (ZYLOPRIM) 300 MG tablet Take 300 mg by mouth daily.      Marland Kitchen amLODipine (NORVASC) 2.5 MG tablet Take 5 mg by mouth daily.     Marland Kitchen aspirin EC 81 MG tablet Take 1 tablet (81 mg total) by mouth daily.    . Cholecalciferol (VITAMIN D3) 1000 UNITS CAPS Take by mouth daily.      . CRESTOR 20 MG tablet Take 0.5 tablets by mouth Daily. Pt takes generic - rosuvastin  1/2 daily    . famotidine (PEPCID) 20 MG tablet Take 20 mg by mouth daily.      . hydrochlorothiazide 25 MG tablet Take 25 mg by mouth daily.      . metoprolol (LOPRESSOR) 50 MG tablet Take 50 mg by mouth 2 (two) times daily.      . Multiple Vitamins-Minerals (CENTRUM SILVER) tablet Take 1 tablet by mouth daily.      Marland Kitchen  ramipril (ALTACE) 10 MG tablet Take 10 mg by mouth daily.      . Sennosides (SENOKOT PO) Take by mouth as needed. FOR CONSTIPATION    . Tamsulosin HCl (FLOMAX) 0.4 MG CAPS Take 0.4 mg by mouth daily.      . clindamycin (CLEOCIN) 300 MG capsule Take 300 mg by mouth as needed. Reported on 04/15/2016     No current facility-administered medications for this visit.    Review of Systems : See HPI for pertinent positives and negatives.  Physical Examination  Filed Vitals:   04/15/16 1051 04/15/16 1101  BP: 118/62 110/60  Pulse: 54 53  Temp: 97.2 F (36.2 C)   TempSrc: Oral   Resp: 16   Height: 5\' 11"  (1.803 m)   Weight: 256 lb (116.121 kg)   SpO2: 96%    Body mass index is 35.72 kg/(m^2).  General: WDWN obese male in NAD GAIT: normal Eyes: PERRLA Pulmonary: Respirations  are non-labored, CTAB Cardiac: regular rhythm, no detected murmur.  VASCULAR EXAM Carotid Bruits Right Left   Negative Negative   Aorta is not palpable. Radial pulses are 2+ palpable and equal.      LE Pulses Right Left   POPLITEAL not palpable  not palpable   POSTERIOR TIBIAL 1+ palpable  faintly palpable    DORSALIS PEDIS  ANTERIOR TIBIAL 2+ palpable  2+ palpable     Gastrointestinal: soft, nontender, BS WNL, no r/g, no palpable masses.  Musculoskeletal: No muscle atrophy/wasting. M/S 5/5 throughout, Extremities without ischemic changes.  Neurologic: A&O X 3; Appropriate Affect;  Speech is normal CN 2-12 intact, Pain and light touch intact in extremities, Motor exam as listed above.         Non-Invasive Vascular Imaging CAROTID DUPLEX 04/15/2016   CEREBROVASCULAR DUPLEX EVALUATION    INDICATION: Carotid artery disease     PREVIOUS INTERVENTION(S): Right carotid endarterectomy 03/14/2008.    DUPLEX EXAM:     RIGHT  LEFT  Peak Systolic Velocities (cm/s) End Diastolic Velocities (cm/s) Plaque LOCATION Peak Systolic Velocities (cm/s) End Diastolic Velocities (cm/s) Plaque  119 22  CCA PROXIMAL 121 22   82 16  CCA MID 90 19   106 22 HM CCA DISTAL 85 15 HT  154 16  ECA   HT  87 17  ICA PROXIMAL 90 26 HT  104 31  ICA MID 75 26 HT  88 36  ICA DISTAL 63 22      ICA / CCA Ratio (PSV)   Antegrade  Vertebral Flow Antegrade    Brachial Systolic Pressure (mmHg)   Multiphasic (Subclavian artery) Brachial Artery Waveforms Multiphasic (Subclavian artery)    Plaque Morphology:  HM = Homogeneous, HT = Heterogeneous, CP = Calcific Plaque, SP = Smooth Plaque, IP = Irregular Plaque     ADDITIONAL FINDINGS:     IMPRESSION: Patent right carotid endarterectomy site with evidence of mild hyperplasia present at  the proximal patch site, velocities suggest 1-39%.  Left internal carotid artery velocities suggest a 1-39% stenosis.     Compared to the previous exam:  No significant change in comparison to the last exam on 04/10/2015.     Assessment: Andre Miranda is a 80 y.o. male who is s/p right carotid endarterectomy on 03/14/2008. He has no history of stroke or TIA. Today's carotid Duplex suggests a patent right carotid endarterectomy site with evidence of mild hyperplasia present at the proximal patch site, velocities suggest 1-39%.  Left internal carotid artery velocities suggest a 1-39%  stenosis. No significant change in comparison to the last exam on 03/28/2014 and 04/10/15.  His atherosclerotic risk factors include former smoker (quit in 1972), obesity, and dyslipidemia (he is on a statin).  Fortunately he does not have DM.  Plan: Follow-up in 1 year with Carotid Duplex scan.   I discussed in depth with the patient the nature of atherosclerosis, and emphasized the importance of maximal medical management including strict control of blood pressure, blood glucose, and lipid levels, obtaining regular exercise, and continued cessation of smoking.  The patient is aware that without maximal medical management the underlying atherosclerotic disease process will progress, limiting the benefit of any interventions. The patient was given information about stroke prevention and what symptoms should prompt the patient to seek immediate medical care. Thank you for allowing [place] to participate in this patient's care.  Clemon Chambers, RN, MSN, FNP-C [facility] Office: [phone]  Clinic Physician: Kellie Simmering  04/15/2016 11:07 AM

## 2016-05-24 DIAGNOSIS — R509 Fever, unspecified: Secondary | ICD-10-CM | POA: Diagnosis not present

## 2016-06-05 DIAGNOSIS — R300 Dysuria: Secondary | ICD-10-CM | POA: Diagnosis not present

## 2016-06-13 NOTE — Addendum Note (Signed)
Addended by: Thresa Ross C on: 06/13/2016 02:05 PM   Modules accepted: Orders

## 2016-07-08 DIAGNOSIS — I10 Essential (primary) hypertension: Secondary | ICD-10-CM | POA: Diagnosis not present

## 2016-07-08 DIAGNOSIS — R7301 Impaired fasting glucose: Secondary | ICD-10-CM | POA: Diagnosis not present

## 2016-07-08 DIAGNOSIS — E784 Other hyperlipidemia: Secondary | ICD-10-CM | POA: Diagnosis not present

## 2016-07-08 DIAGNOSIS — M109 Gout, unspecified: Secondary | ICD-10-CM | POA: Diagnosis not present

## 2016-07-15 DIAGNOSIS — Z6836 Body mass index (BMI) 36.0-36.9, adult: Secondary | ICD-10-CM | POA: Diagnosis not present

## 2016-07-15 DIAGNOSIS — Z0000 Encounter for general adult medical examination without abnormal findings: Secondary | ICD-10-CM | POA: Diagnosis not present

## 2016-07-15 DIAGNOSIS — I350 Nonrheumatic aortic (valve) stenosis: Secondary | ICD-10-CM | POA: Diagnosis not present

## 2016-07-15 DIAGNOSIS — D485 Neoplasm of uncertain behavior of skin: Secondary | ICD-10-CM | POA: Diagnosis not present

## 2016-07-15 DIAGNOSIS — R911 Solitary pulmonary nodule: Secondary | ICD-10-CM | POA: Diagnosis not present

## 2016-07-15 DIAGNOSIS — R808 Other proteinuria: Secondary | ICD-10-CM | POA: Diagnosis not present

## 2016-07-15 DIAGNOSIS — Z23 Encounter for immunization: Secondary | ICD-10-CM | POA: Diagnosis not present

## 2016-07-15 DIAGNOSIS — R7301 Impaired fasting glucose: Secondary | ICD-10-CM | POA: Diagnosis not present

## 2016-07-15 DIAGNOSIS — C61 Malignant neoplasm of prostate: Secondary | ICD-10-CM | POA: Diagnosis not present

## 2016-07-15 DIAGNOSIS — E668 Other obesity: Secondary | ICD-10-CM | POA: Diagnosis not present

## 2016-07-15 DIAGNOSIS — K625 Hemorrhage of anus and rectum: Secondary | ICD-10-CM | POA: Diagnosis not present

## 2016-07-15 DIAGNOSIS — Z1389 Encounter for screening for other disorder: Secondary | ICD-10-CM | POA: Diagnosis not present

## 2016-07-15 DIAGNOSIS — I2510 Atherosclerotic heart disease of native coronary artery without angina pectoris: Secondary | ICD-10-CM | POA: Diagnosis not present

## 2016-07-25 ENCOUNTER — Encounter: Payer: Self-pay | Admitting: Cardiology

## 2016-07-25 ENCOUNTER — Ambulatory Visit (INDEPENDENT_AMBULATORY_CARE_PROVIDER_SITE_OTHER): Payer: Medicare Other | Admitting: Cardiology

## 2016-07-25 VITALS — BP 122/66 | HR 58 | Ht 71.0 in | Wt 256.0 lb

## 2016-07-25 DIAGNOSIS — I2510 Atherosclerotic heart disease of native coronary artery without angina pectoris: Secondary | ICD-10-CM | POA: Diagnosis not present

## 2016-07-25 DIAGNOSIS — I350 Nonrheumatic aortic (valve) stenosis: Secondary | ICD-10-CM

## 2016-07-25 DIAGNOSIS — I6521 Occlusion and stenosis of right carotid artery: Secondary | ICD-10-CM

## 2016-07-25 NOTE — Patient Instructions (Signed)
Medication Instructions:  Your physician recommends that you continue on your current medications as directed. Please refer to the Current Medication list given to you today.   Labwork: none  Testing/Procedures: none  Follow-Up: Your physician wants you to follow-up in: 12 months with Dr. Smith. You will receive a reminder letter in the mail two months in advance. If you don't receive a letter, please call our office to schedule the follow-up appointment.   Any Other Special Instructions Will Be Listed Below (If Applicable).     If you need a refill on your cardiac medications before your next appointment, please call your pharmacy.    

## 2016-07-27 NOTE — Progress Notes (Signed)
Patient ID: Andre Miranda, male   DOB: 1936-01-30, 80 y.o.   MRN: [id number] PCP: Dr. Joylene Draft  80 yo with history of CAD s/p CABG and aortic stenosis presents for cardiology followup.  He had CABG in 2003 and has not had a catheterization since.  Last Cardiolite in 6/11 showed no ischemia or infarction.  He developed severe aortic stenosis.  I thought he would be best served by TAVR.  He ended up opting to go to [facility] for this.  He had 27 mm DFM as part of SALUS trial in 2/16.  Most recent echo in 9/16 showed well-seated bioprosthetic aortic valve.    He is stable symptomatically.  No exertional dyspnea or chest pain. Main limitation is from low back pain.  He goes to an exercise class 3 days a week.  Weight is up.    Labs (6/13): K 4.1, creatinine 1.0, LDL 64, HDL 44, TSH normal Labs (7/16): K 3.9, creatinine 0.9, LDL 49 Labs (8/17): K 4.1, creatinine 0.9, LDL 62, HDL 39, TSH normal  ECG; NSR, RBBB, 1st degree AVB  PMH: 1. HTN 2. Gout 3. OSA: CPAP 4. Prostate cancer s/p radiation.  Has had radiation proctitis and cystitis with bleeding.  5. CAD: CABG 2003 with LIMA-LAD, RIMA-RCA, seq SVG-ramus and distal CFX.  Cardiolite in 6/11 with no ischemia or infarction, EF 56%. LHC pre-TAVR in 2016 showed stable anatomy (done at [facility]).  6. Aortic stenosis: Echo (5/12) with EF 55-60%, mild LVH, probably moderate AS.  Echo (1/14) with EF 55-60%, moderate LVH, moderate AS with mean gradient 33 mmHg, peak 53 mmHg, RV mildly dilated.  Echo (1/16) with EF 60-65%, severe AS with AVA 0.71 cm2, mean gradient 55 mmHg, peak gradient 87 mmHg.  He had TAVR with 27 mm DFM as part of SALUS trial in 2/16.   - Echo (5/16) with EF 55%, bioprosthetic aortic valve mean gradient 11 mmHg, normal RV size and systolic function.  - Echo (9/16) with EF 55-60%, moderate LVH, normal RV size and systolic function, normal bioprosthetic aortic valve s/p TAVR.  7. Carotid stenosis: Right CEA in 4/09 (Early).  Carotid dopplers (5/14)  with patent R CEA, < [id number] LICA stenosis.  8. CCY in 2003 9. Renal artery stenosis: Renal artery duplex (4/14) with 1-59% bilateral proximal RAS.   10. Lung nodules: Likely granulomas (stable).  11. PFTs (8/13): Mild obstructive airways disease.   SH: Retired Hotel manager (Retail banker), married, nonsmoker.   FH: No premature CAD  ROS: All systems reviewed and negative except as per HPI.   Current Outpatient Prescriptions  Medication Sig Dispense Refill  . allopurinol (ZYLOPRIM) 300 MG tablet Take 300 mg by mouth daily.      Marland Kitchen amLODipine (NORVASC) 2.5 MG tablet Take 5 mg by mouth daily.     Marland Kitchen aspirin EC 81 MG tablet Take 1 tablet (81 mg total) by mouth daily.    . Cholecalciferol (VITAMIN D3) 1000 UNITS CAPS Take by mouth daily.      . clindamycin (CLEOCIN) 300 MG capsule Take 300 mg by mouth as needed. Reported on 04/15/2016    . CRESTOR 20 MG tablet Take 0.5 tablets by mouth Daily. Pt takes generic - rosuvastin  1/2 daily    . famotidine (PEPCID) 20 MG tablet Take 20 mg by mouth daily.      . hydrochlorothiazide 25 MG tablet Take 25 mg by mouth daily.      . metoprolol (LOPRESSOR) 50 MG tablet Take 50 mg by  mouth 2 (two) times daily.      . Multiple Vitamins-Minerals (CENTRUM SILVER) tablet Take 1 tablet by mouth daily.      . ramipril (ALTACE) 10 MG tablet Take 10 mg by mouth daily.      . Sennosides (SENOKOT PO) Take by mouth as needed. FOR CONSTIPATION    . Tamsulosin HCl (FLOMAX) 0.4 MG CAPS Take 0.4 mg by mouth daily.       No current facility-administered medications for this visit.     BP 122/66   Pulse (!) 58   Ht 5\' 11"  (1.803 m)   Wt 256 lb (116.1 kg)   BMI 35.70 kg/m  General: NAD, obese Neck: No JVD, no thyromegaly or thyroid nodule.  Lungs: Clear to auscultation bilaterally with normal respiratory effort. CV: Nondisplaced PMI.  Heart regular S1/S2, no S3/S4, 2/6 early SEM RUSB.  1+ ankle edema.  No carotid bruit.  Normal pedal pulses.  Abdomen: Soft, nontender,  no hepatosplenomegaly, no distention.  Neurologic: Alert and oriented x 3.  Psych: Normal affect. Extremities: No clubbing or cyanosis.   Assessment/Plan:  Aortic stenosis Status post TAVR in 2/16 with 27 mm DFM valve as part of SALUS trial.  Echo in 9/16 showed well-seated bioprosthetic aortic valve. He will have followup echoes through the trial at [facility].  Carotid arterial disease Follows with Dr. Donnetta Hutching once a year.  CAD  Stable with no ischemic symptoms. Continue ASA, statin, ramipril. Pre-op cath at [facility] looked ok per his report.  Hyperlipidemia Continue statin given hyperlipidemia.  Good lipids in 8/17. Hypertension BP controlled on home checks.   He will followup in a year. Given my transition to CHF clinic, I will have him see Dr. Tamala Julian who follows his wife.   Loralie Champagne 07/27/2016

## 2016-08-04 DIAGNOSIS — N35013 Post-traumatic anterior urethral stricture: Secondary | ICD-10-CM | POA: Diagnosis not present

## 2016-08-04 DIAGNOSIS — C61 Malignant neoplasm of prostate: Secondary | ICD-10-CM | POA: Diagnosis not present

## 2016-09-29 DIAGNOSIS — H524 Presbyopia: Secondary | ICD-10-CM | POA: Diagnosis not present

## 2016-09-29 DIAGNOSIS — H2513 Age-related nuclear cataract, bilateral: Secondary | ICD-10-CM | POA: Diagnosis not present

## 2016-09-29 DIAGNOSIS — H31002 Unspecified chorioretinal scars, left eye: Secondary | ICD-10-CM | POA: Diagnosis not present

## 2016-09-30 ENCOUNTER — Ambulatory Visit: Payer: Self-pay | Admitting: Internal Medicine

## 2016-09-30 ENCOUNTER — Telehealth: Payer: Self-pay | Admitting: Internal Medicine

## 2016-09-30 ENCOUNTER — Ambulatory Visit (INDEPENDENT_AMBULATORY_CARE_PROVIDER_SITE_OTHER): Payer: Medicare Other | Admitting: Internal Medicine

## 2016-09-30 ENCOUNTER — Encounter: Payer: Self-pay | Admitting: Internal Medicine

## 2016-09-30 DIAGNOSIS — I6521 Occlusion and stenosis of right carotid artery: Secondary | ICD-10-CM

## 2016-09-30 DIAGNOSIS — R918 Other nonspecific abnormal finding of lung field: Secondary | ICD-10-CM

## 2016-09-30 DIAGNOSIS — G4733 Obstructive sleep apnea (adult) (pediatric): Secondary | ICD-10-CM | POA: Diagnosis not present

## 2016-09-30 NOTE — Progress Notes (Signed)
Subjective:    Patient ID: Andre Miranda, male    DOB: 09-20-1936, 80 y.o.   MRN: [id number]  HPI  M former smoker followed for OSA complicated by CAD/CABG/PAD, aortic stenosis, lung nodules CPAP 10/ [facility]    Had flu vaccine PFT 06/29/2012-mild obstructive airways disease and small airways with response to bronchodilator. Normal lung volumes and diffusion. FEV13.10/106%, FEV1/FVC 0.79, FEF 25-75% 3.04/118% with 72% response to bronchodilator.    09/28/14- 76 yoM former smoker followed for OSA complicated by hx CAD/CABG/PAD, aortic stenosis, lung nodules CPAP 10/ [facility]    Had flu vaccine FOLLOW FOR:  OSA; Pt will bring card for download, could not obtain download; Pt states equipment is working well; wears CPAP every night approx 5-6 hours nightly [facility] is watching is stable lung nodules on CT Was off Ramipril for 2 months with no effect on chronic cough so he went back because of cost.  he wears his CPAP mask routinely unless he has a head cold with nasal congestion. New complaint of steady, nonpleuritic soreness right lateral chest for past few weeks. No sudden event or trauma.  10/01/15-80 year old male former smoker followed for OSA complicated by history CAD/CABG/PAD, AS/TAVR, lung nodules CPAP 10/ DME company changed because of Medicare to ? FOLLOWS FOR: Pt continues to wear CPAP every night for about 6 hours; Changed DME compaines due to Insurance coverage. No recent DL's. Had TAVR aortic valve replacement at [facility] without problems. CT chest done for that. By reported measurement, he says chronic lung nodules were unchanged from prior CTs done at [facility]. Denies cough or respiratory change. CT chest/abd- 01/08/15-[facility]- Severe CAD, Aortic valve calcification  09/30/2016-80 year old male former smoker followed for OSA complicated by history CAD/CABG/PAD, Aortic stenosis/TAVR, lung nodules FOLLOWS FOR:DME: [employer] Pt wears CPAP every night for about 6 hours; some  nights are about 4-5.Download confirms 100% 4 hour compliance with excellent control 0.9 AHI. He says quality of life is absolutely better with CPAP. He is going to call us to verify the name of the DME company he is changed to. Otherwise breathing is comfortable with no recent or acute cardiopulmonary issues.  ROS-see HPI Constitutional:   No-   weight loss, night sweats, fevers, chills, fatigue, lassitude. HEENT:   No-  headaches, difficulty swallowing, tooth/dental problems, sore throat,       No-  sneezing, itching, ear ache, nasal congestion, post nasal drip,  CV:  +chest pain,  No-orthopnea, PND, swelling in lower extremities, anasarca,   dizziness, palpitations Resp: No-   shortness of breath with exertion or at rest.              No-   productive cough,  + non-productive cough,  No- coughing up of blood.              No-   change in color of mucus.  No- wheezing.   Skin: No-   rash or lesions. GI:  No-   heartburn, indigestion, abdominal pain, nausea, vomiting,  GU:  MS:  No-   joint pain or swelling.  . Neuro-     nothing unusual Psych:  No- change in mood or affect. No depression or anxiety.  No memory loss.  Objective:   Physical Exam General- Alert, Oriented, Affect-appropriate, Distress- none acute  + Overweight Skin- rash-none, lesions- none, excoriation- none Lymphadenopathy- none Head- atraumatic Eyes- Gross vision intact, PERRLA, conjunctivae clear, secretions Ears- Hearing, canals, Tm - normal Nose- Clear, No-Septal dev, mucus, polyps,  erosion, perforation Mild external deviation Throat- Mallampati II-III , mucosa clear , drainage- none, tonsils- atrophic Neck- flexible , trachea midline, no stridor , thyroid nl, carotid no bruit   Thick neck Chest - symmetrical excursion , unlabored  Heart/CV- RRR , murmur-none heard , no gallop  , no rub, nl s1 s2  - JVD- none , edema- none, stasis changes- none, varices- none  Lung- clear to P&A, wheeze- none, cough-None ,  dullness-none, rub- none   Chest wall- Abd-  Br/ Gen/ Rectal- Not done, not indicated Extrem- cyanosis- none, clubbing, none, atrophy- none, strength- nl Neuro- grossly intact to observation  Assessment & Plan:

## 2016-09-30 NOTE — Assessment & Plan Note (Signed)
Being followed at [facility] by report with no new issues.

## 2016-09-30 NOTE — Telephone Encounter (Signed)
Spoke with the pt  He states calling to let [facility] know that [employer] in [place] is his DME  He is not currently needing any orders sent to them, just an FYI  This is already documented on his [facility] note  Nothing further needed

## 2016-09-30 NOTE — Assessment & Plan Note (Signed)
Pressure remains at 10 and he continues to feel quality of life is much improved using CPAP. Download confirms excellent compliance and control. We discussed cleaning and care of equipment.

## 2016-09-30 NOTE — Patient Instructions (Signed)
Please call us to verify name of your DME/ Homecare company in [facility] for CPAP upkeep and supplies  We can continue current CPAP settings, mask of choice humidifier, supplies, [employer]    Dx OSA  Please call as needed

## 2016-10-14 DIAGNOSIS — N99113 Postprocedural anterior urethral stricture: Secondary | ICD-10-CM | POA: Diagnosis not present

## 2016-10-14 DIAGNOSIS — C61 Malignant neoplasm of prostate: Secondary | ICD-10-CM | POA: Diagnosis not present

## 2016-10-14 DIAGNOSIS — R300 Dysuria: Secondary | ICD-10-CM | POA: Diagnosis not present

## 2016-12-12 DIAGNOSIS — N99113 Postprocedural anterior urethral stricture: Secondary | ICD-10-CM | POA: Diagnosis not present

## 2016-12-12 DIAGNOSIS — R300 Dysuria: Secondary | ICD-10-CM | POA: Diagnosis not present

## 2016-12-12 DIAGNOSIS — N35013 Post-traumatic anterior urethral stricture: Secondary | ICD-10-CM | POA: Diagnosis not present

## 2016-12-12 DIAGNOSIS — C61 Malignant neoplasm of prostate: Secondary | ICD-10-CM | POA: Diagnosis not present

## 2017-02-02 DIAGNOSIS — N35013 Post-traumatic anterior urethral stricture: Secondary | ICD-10-CM | POA: Diagnosis not present

## 2017-02-02 DIAGNOSIS — C61 Malignant neoplasm of prostate: Secondary | ICD-10-CM | POA: Diagnosis not present

## 2017-02-04 DIAGNOSIS — L304 Erythema intertrigo: Secondary | ICD-10-CM | POA: Diagnosis not present

## 2017-02-04 DIAGNOSIS — L570 Actinic keratosis: Secondary | ICD-10-CM | POA: Diagnosis not present

## 2017-02-04 DIAGNOSIS — L821 Other seborrheic keratosis: Secondary | ICD-10-CM | POA: Diagnosis not present

## 2017-02-26 DIAGNOSIS — I10 Essential (primary) hypertension: Secondary | ICD-10-CM | POA: Diagnosis not present

## 2017-02-26 DIAGNOSIS — I2510 Atherosclerotic heart disease of native coronary artery without angina pectoris: Secondary | ICD-10-CM | POA: Diagnosis not present

## 2017-02-26 DIAGNOSIS — Z952 Presence of prosthetic heart valve: Secondary | ICD-10-CM | POA: Diagnosis not present

## 2017-04-28 ENCOUNTER — Ambulatory Visit: Payer: Medicare Other | Admitting: Family

## 2017-04-28 ENCOUNTER — Ambulatory Visit (HOSPITAL_COMMUNITY): Payer: Medicare Other

## 2017-05-07 ENCOUNTER — Encounter: Payer: Self-pay | Admitting: Family

## 2017-05-19 ENCOUNTER — Ambulatory Visit (INDEPENDENT_AMBULATORY_CARE_PROVIDER_SITE_OTHER): Payer: Medicare Other | Admitting: Family

## 2017-05-19 ENCOUNTER — Ambulatory Visit (HOSPITAL_COMMUNITY)
Admission: RE | Admit: 2017-05-19 | Discharge: 2017-05-19 | Disposition: A | Discharge status: Home / Self Care | Payer: Medicare Other | Source: Ambulatory Visit | Attending: Family | Admitting: Family

## 2017-05-19 ENCOUNTER — Encounter: Payer: Self-pay | Admitting: Family

## 2017-05-19 VITALS — BP 159/81 | HR 53 | Temp 97.1°F | Resp 20 | Ht 71.0 in | Wt 255.0 lb

## 2017-05-19 DIAGNOSIS — Z9889 Other specified postprocedural states: Secondary | ICD-10-CM | POA: Diagnosis not present

## 2017-05-19 DIAGNOSIS — I6521 Occlusion and stenosis of right carotid artery: Secondary | ICD-10-CM | POA: Diagnosis not present

## 2017-05-19 DIAGNOSIS — Z48812 Encounter for surgical aftercare following surgery on the circulatory system: Secondary | ICD-10-CM | POA: Diagnosis not present

## 2017-05-19 LAB — VAS US CAROTID
LEFT ECA DIAS: -12 cm/s
LEFT VERTEBRAL DIAS: -16 cm/s
LICAPSYS: 52 cm/s
Left CCA dist dias: -15 cm/s
Left CCA dist sys: -71 cm/s
Left CCA prox dias: 11 cm/s
Left CCA prox sys: 87 cm/s
Left ICA dist dias: -22 cm/s
Left ICA dist sys: -67 cm/s
Left ICA prox dias: 12 cm/s
RCCAPDIAS: 16 cm/s
RIGHT CCA MID DIAS: 21 cm/s
RIGHT VERTEBRAL DIAS: 11 cm/s
Right CCA prox sys: 104 cm/s
Right cca dist sys: -76 cm/s

## 2017-05-19 NOTE — Patient Instructions (Signed)
Stroke Prevention Some medical conditions and behaviors are associated with an increased chance of having a stroke. You may prevent a stroke by making healthy choices and managing medical conditions. How can I reduce my risk of having a stroke?  Stay physically active. Get at least 30 minutes of activity on most or all days.  Do not smoke. It may also be helpful to avoid exposure to secondhand smoke.  Limit alcohol use. Moderate alcohol use is considered to be: ? No more than 2 drinks per day for men. ? No more than 1 drink per day for nonpregnant women.  Eat healthy foods. This involves: ? Eating 5 or more servings of fruits and vegetables a day. ? Making dietary changes that address high blood pressure (hypertension), high cholesterol, diabetes, or obesity.  Manage your cholesterol levels. ? Making food choices that are high in fiber and low in saturated fat, trans fat, and cholesterol may control cholesterol levels. ? Take any prescribed medicines to control cholesterol as directed by your health care provider.  Manage your diabetes. ? Controlling your carbohydrate and sugar intake is recommended to manage diabetes. ? Take any prescribed medicines to control diabetes as directed by your health care provider.  Control your hypertension. ? Making food choices that are low in salt (sodium), saturated fat, trans fat, and cholesterol is recommended to manage hypertension. ? Ask your health care provider if you need treatment to lower your blood pressure. Take any prescribed medicines to control hypertension as directed by your health care provider. ? If you are 18-39 years of age, have your blood pressure checked every 3-5 years. If you are 40 years of age or older, have your blood pressure checked every year.  Maintain a healthy weight. ? Reducing calorie intake and making food choices that are low in sodium, saturated fat, trans fat, and cholesterol are recommended to manage  weight.  Stop drug abuse.  Avoid taking birth control pills. ? Talk to your health care provider about the risks of taking birth control pills if you are over 35 years old, smoke, get migraines, or have ever had a blood clot.  Get evaluated for sleep disorders (sleep apnea). ? Talk to your health care provider about getting a sleep evaluation if you snore a lot or have excessive sleepiness.  Take medicines only as directed by your health care provider. ? For some people, aspirin or blood thinners (anticoagulants) are helpful in reducing the risk of forming abnormal blood clots that can lead to stroke. If you have the irregular heart rhythm of atrial fibrillation, you should be on a blood thinner unless there is a good reason you cannot take them. ? Understand all your medicine instructions.  Make sure that other conditions (such as anemia or atherosclerosis) are addressed. Get help right away if:  You have sudden weakness or numbness of the face, arm, or leg, especially on one side of the body.  Your face or eyelid droops to one side.  You have sudden confusion.  You have trouble speaking (aphasia) or understanding.  You have sudden trouble seeing in one or both eyes.  You have sudden trouble walking.  You have dizziness.  You have a loss of balance or coordination.  You have a sudden, severe headache with no known cause.  You have new chest pain or an irregular heartbeat. Any of these symptoms may represent a serious problem that is an emergency. Do not wait to see if the symptoms will go away.   Get medical help at once. Call your local emergency services (911 in [place] Do not drive yourself to the hospital. This information is not intended to replace advice given to you by your health care provider. Make sure you discuss any questions you have with your health care provider. Document Released: 12/11/2004 Document Revised: 04/10/2016 Document Reviewed: 05/06/2013 Elsevier  Interactive Patient Education  2017 Elsevier Inc.     Preventing Cerebrovascular Disease Arteries are blood vessels that carry blood that contains oxygen from the heart to all parts of the body. Cerebrovascular disease affects arteries that supply the brain. Any condition that blocks or disrupts blood flow to the brain can cause cerebrovascular disease. Brain cells that lose blood supply start to die within minutes (stroke). Stroke is the main danger of cerebrovascular disease. Atherosclerosis and high blood pressure are common causes of cerebrovascular disease. Atherosclerosis is narrowing and hardening of an artery that results when fat, cholesterol, calcium, or other substances (plaque) build up inside an artery. Plaque reduces blood flow through the artery. High blood pressure increases the risk of bleeding inside the brain. Making diet and lifestyle changes to prevent atherosclerosis and high blood pressure lowers your risk of cerebrovascular disease. What nutrition changes can be made?  Eat more fruits, vegetables, and whole grains.  Reduce how much saturated fat you eat. To do this, eat less red meat and fewer full-fat dairy products.  Eat healthy proteins instead of red meat. Healthy proteins include: ? Fish. Eat fish that contains heart-healthy omega-3 fatty acids, twice a week. Examples include salmon, albacore tuna, mackerel, and herring. ? Chicken. ? Nuts. ? Low-fat or nonfat yogurt.  Avoid processed meats, like bacon and lunchmeat.  Avoid foods that contain: ? A lot of sugar, such as sweets and drinks with added sugar. ? A lot of salt (sodium). Avoid adding extra salt to your food, as told by your health care provider. ? Trans fats, such as margarine and baked goods. Trans fats may be listed as "partially hydrogenated oils" on food labels.  Check food labels to see how much sodium, sugar, and trans fats are in foods.  Use vegetable oils that contain low amounts of  saturated fat, such as olive oil or canola oil. What lifestyle changes can be made?  Drink alcohol in moderation. This means no more than 1 drink a day for nonpregnant women and 2 drinks a day for men. One drink equals 12 oz of beer, 5 oz of wine, or 1 oz of hard liquor.  If you are overweight, ask your health care provider to recommend a weight-loss plan for you. Losing 5-10 lb (2.2-4.5 kg) can reduce your risk of diabetes, atherosclerosis, and high blood pressure.  Exercise for 30?60 minutes on most days, or as much as told by your health care provider. ? Do moderate-intensity exercise, such as brisk walking, bicycling, and water aerobics. Ask your health care provider which activities are safe for you.  Do not use any products that contain nicotine or tobacco, such as cigarettes and e-cigarettes. If you need help quitting, ask your health care provider. Why are these changes important? Making these changes lowers your risk of many diseases that can cause cerebrovascular disease and stroke. Stroke is a leading cause of death and disability. Making these changes also improves your overall health and quality of life. What can I do to lower my risk? The following factors make you more likely to develop cerebrovascular disease:  Being overweight.  Smoking.  Being physically inactive.    Eating a high-fat diet.  Having certain health conditions, such as: ? Diabetes. ? High blood pressure. ? Heart disease. ? Atherosclerosis. ? High cholesterol. ? Sickle cell disease.  Talk with your health care provider about your risk for cerebrovascular disease. Work with your health care provider to control diseases that you have that may contribute to cerebrovascular disease. Your health care provider may prescribe medicines to help prevent major causes of cerebrovascular disease. Where to find more information: Learn more about preventing cerebrovascular disease from:  National Heart, Lung, and  Blood Institute: www.nhlbi.nih.gov/health/health-topics/topics/stroke  Centers for Disease Control and Prevention: cdc.gov/stroke/about.htm  Summary  Cerebrovascular disease can lead to a stroke.  Atherosclerosis and high blood pressure are major causes of cerebrovascular disease.  Making diet and lifestyle changes can reduce your risk of cerebrovascular disease.  Work with your health care provider to get your risk factors under control to reduce your risk of cerebrovascular disease. This information is not intended to replace advice given to you by your health care provider. Make sure you discuss any questions you have with your health care provider. Document Released: 11/18/2015 Document Revised: 05/23/2016 Document Reviewed: 11/18/2015 Elsevier Interactive Patient Education  2018 Elsevier Inc.  

## 2017-05-19 NOTE — Progress Notes (Signed)
Chief Complaint: Follow up Extracranial Carotid Artery Stenosis   History of Present Illness  Andre Miranda is a 81 y.o. male patient of Dr. Donnetta Hutching who is s/p right carotid endarterectomy by Dr. Donnetta Hutching in 2009 for severe asymptomatic carotid disease.  The patient denies any history of TIA or stroke symptoms, specifically he denies a history of amaurosis fugax or monocular blindness, unilateral facial drooping, hemiplegia/hemiparesis, or receptive or expressive aphasia.  He denies tingling, numbness, pain, or cold feeling in either upper extremity.  He denies claudication symptoms with walking.   He had an aortic valve replaced March 2016 at [facility] for asymptomatic aortic valve stenosis. Dr. Aundra Dubin is his cardiologist.  Pt Diabetic: no Pt smoker: former smoker, quit in 1972  Pt meds include: Statin : yes ASA: yes Other anticoagulants/antiplatelets: Plavix for 3 months post aortic valve relplacement    Past Medical History:  Diagnosis Date  . Aortic stenosis    MODERATE  . Carotid arterial disease ([place])   . Dysuria   . Gout   . Hematuria   . Hyperlipidemia   . Hypertension   . Obesity   . Sleep apnea     Social History Social History  Substance Use Topics  . Smoking status: Former Smoker    Packs/day: 1.50    Years: 7.00    Types: Cigarettes    Quit date: 04/03/1971  . Smokeless tobacco: Never Used  . Alcohol use Yes    Family History Family History  Problem Relation Age of Onset  . Heart attack Father   . Pneumonia Mother 31    Surgical History Past Surgical History:  Procedure Laterality Date  . AORTIC VALVE REPAIR  February 06, 2015   Dr. Aline Brochure and Dr. Ysidro Evert  at [facility]  . [place]  02/2008  . CHOLECYSTECTOMY  04/2002  . CORONARY ARTERY BYPASS GRAFT  2003   LIMA TO THE LAD, RIMA TO THE RCA, AND A SAPHENOUS VEIN GRAFT TO THE INTERMEDIATE AND DISTAL LEFT CIRCUMFLEX  . US ECHOCARDIOGRAPHY  08/2009   SHOWED A MEAN GRADIENT  ACROSS IS AORTIC VALVE OF 24 MM OF MERCURY. HE HAD MODERATE LVH AND NORMAL LV FUNCTION    Allergies  Allergen Reactions  . Ibuprofen Hypertension  . Penicillin G Other (See Comments)    Pt. Does not want to take this medication because of the reaction hie Father had; it almost killed his Dad. Has tolerate PCN before    Current Outpatient Prescriptions  Medication Sig Dispense Refill  . acetaminophen (TYLENOL) 325 MG tablet Take 650 mg by mouth every 6 (six) hours as needed.    Marland Kitchen allopurinol (ZYLOPRIM) 300 MG tablet Take 300 mg by mouth daily.      Marland Kitchen amLODipine (NORVASC) 2.5 MG tablet Take 5 mg by mouth daily.     Marland Kitchen aspirin EC 81 MG tablet Take 1 tablet (81 mg total) by mouth daily.    . Cholecalciferol (VITAMIN D3) 1000 UNITS CAPS Take by mouth daily.      . clindamycin (CLEOCIN) 150 MG capsule Take 150 mg by mouth as needed. 4 Capsules 54mins prior to dental appt  (600mg )    . clindamycin (CLEOCIN) 300 MG capsule Take 300 mg by mouth as needed. Reported on 04/15/2016    . CRESTOR 20 MG tablet Take 0.5 tablets by mouth Daily. Pt takes generic - rosuvastin  1/2 daily    . famotidine (PEPCID) 20 MG tablet Take 20 mg by mouth daily.      Marland Kitchen  hydrochlorothiazide 25 MG tablet Take 25 mg by mouth daily.      . metoprolol (LOPRESSOR) 50 MG tablet Take 50 mg by mouth 2 (two) times daily.      . Multiple Vitamins-Minerals (CENTRUM SILVER) tablet Take 1 tablet by mouth daily.      . ramipril (ALTACE) 10 MG tablet Take 10 mg by mouth daily.      . Sennosides (SENOKOT PO) Take by mouth as needed. FOR CONSTIPATION    . Tamsulosin HCl (FLOMAX) 0.4 MG CAPS Take 0.4 mg by mouth daily.       No current facility-administered medications for this visit.     Review of Systems : See HPI for pertinent positives and negatives.  Physical Examination  Vitals:   05/19/17 1040 05/19/17 1048  BP: (!) 152/81 (!) 159/81  Pulse: (!) 53   Resp: 20   Temp: (!) 97.1 F (36.2 C)   TempSrc: Oral   SpO2: 96%    Weight: 255 lb (115.7 kg)   Height: 5\' 11"  (1.803 m)    Body mass index is 35.57 kg/m.  General: WD obese male in NAD GAIT: normal Eyes: PERRLA Pulmonary: Respirations are non-labored, CTAB Cardiac: Regular rhythm, bradycardic (on a beta blocker), no detected murmur.  VASCULAR EXAM Carotid Bruits Right Left   Negative Negative   Abdominal aortic pulse is not palpable. Radial pulses are 2+ palpable and equal.      LE Pulses Right Left   POPLITEAL not palpable  not palpable   POSTERIOR TIBIAL 1+ palpable  faintly palpable    DORSALIS PEDIS  ANTERIOR TIBIAL 2+ palpable  2+ palpable     Gastrointestinal: softly obese, nontender, BS WNL, no r/g, no palpable masses.  Musculoskeletal: No muscle atrophy/wasting. M/S 5/5 throughout, Extremities without ischemic changes.  Neurologic: A&O X 3; appropriate affect; speech is normal, CN 2-12 intact, pain and light touch intact in extremities, motor exam as listed above     Assessment: Andre Miranda is a 81 y.o. male who is s/p right carotid endarterectomy on 03/14/2008. He has no history of stroke or TIA.  His atherosclerotic risk factors include former smoker (quit in 1972), obesity, and dyslipidemia (he is on a statin).  Fortunately he does not have DM.  DATA Today's carotid Duplex suggests a patent right carotid endarterectomy site with evidence of mild hyperplasia present at the proximal patch site, velocities suggest 1-39%.  Left internal carotid artery velocities suggest a 1-39% stenosis. Bilateral vertebral artery flow is antegrade.  Bilateral subclavian artery waveforms are normal.  No significant change in comparison to the last exam on 03/28/2014, 04/10/15, and 04/15/16.   Plan:  Follow-up in 18 months with Carotid Duplex scan.   I  discussed in depth with the patient the nature of atherosclerosis, and emphasized the importance of maximal medical management including strict control of blood pressure, blood glucose, and lipid levels, obtaining regular exercise, and continued cessation of smoking.  The patient is aware that without maximal medical management the underlying atherosclerotic disease process will progress, limiting the benefit of any interventions. The patient was given information about stroke prevention and what symptoms should prompt the patient to seek immediate medical care. Thank you for allowing [place] to participate in this patient's care.  Clemon Chambers, RN, MSN, FNP-C [facility] Office: [phone]  Clinic Physician: Scot Dock on call  05/19/17 10:49 AM

## 2017-07-08 NOTE — Addendum Note (Signed)
Addended by: Lianne Cure A on: 07/08/2017 12:01 PM   Modules accepted: Orders

## 2017-07-17 ENCOUNTER — Telehealth: Payer: Self-pay | Admitting: Internal Medicine

## 2017-07-17 DIAGNOSIS — Z6836 Body mass index (BMI) 36.0-36.9, adult: Secondary | ICD-10-CM | POA: Diagnosis not present

## 2017-07-17 DIAGNOSIS — I10 Essential (primary) hypertension: Secondary | ICD-10-CM | POA: Diagnosis not present

## 2017-07-17 NOTE — Telephone Encounter (Signed)
Followed by Dr. Marigene Ehlers.  BP this morning was 200/100, went to Dr. Kermit Balo office today and BP came down to 140/70 with morning meds.  Tonight BP is 200/100 again (checked by wife who is RN with manual cuff).  Pulse is 73.  Advised to take rampiril if BP was high (took 30 minutes ago). States that he feels somewhat flushed in his face.  No CP, SOB or LH.  Typical BP at home is 140/70s.  Has been taking meds as directed.  No changes to his meds at visit today, just advised to take an extra ramipril.  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  I advised him to recheck his BP in the morning tomorrow an hour after his meds and if better controlled to continue taking the ramipril twice a day.  Advised him to go to the ER if he developed CP, SOB, LH, or visual changes.  Magda Paganini, MD Cardiology fellow

## 2017-07-21 ENCOUNTER — Telehealth: Payer: Self-pay | Admitting: Interventional Cardiology

## 2017-07-21 NOTE — Telephone Encounter (Signed)
New message    Pt is calling.   Pt c/o BP issue: STAT if pt c/o blurred vision, one-sided weakness or slurred speech  1. What are your last 5 BP readings? 200/100 for about a week.  2. Are you having any other symptoms (ex. Dizziness, headache, blurred vision, passed out)? No other symptoms  3. What is your BP issue? Pt states his bp is running high

## 2017-07-21 NOTE — Telephone Encounter (Signed)
Pt states BP has been running hgih x 1 week.  Has been 200's/90-100's, HR 58-72.  Pt seen PCP Friday and they told him to increase him Ramipril to 10mg  BID.  He has been doing this since Friday with no success in BP coming down.  Last night prior to PM Ramipril at 6pm pt's BP was 189/69, at 10pm 210/94 and this morning 210/89.  Other BP's from this weekend include 200/94, 175/88, 214/97, 200/100.  Pt denies any sx.  Current medications are: Ramipril 10mg  BID (since Friday) HCTZ 25mg  QD Amlodipine 5mg  QD Metoprolol Tartrate 50mg  BID Will route to Dr. Tamala Julian for review and advisement.  This is a former pt of Dr. Aundra Dubin who will be seeing you for the first time 9/10.

## 2017-07-21 NOTE — Telephone Encounter (Signed)
Andre Miranda is calling to give you the correct dosage on his Amlodipine . He is taking 2.5 mg instead of 5 mg . Thanks

## 2017-07-21 NOTE — Telephone Encounter (Signed)
Increase amlodipine to 5 mg daily. Get BMET. Needs to be seen either in BP clinic or  By APP.

## 2017-07-22 ENCOUNTER — Telehealth: Payer: Self-pay | Admitting: Interventional Cardiology

## 2017-07-22 MED ORDER — AMLODIPINE BESYLATE 5 MG PO TABS: 5.0000 mg | ORAL_TABLET | Freq: Every day | ORAL | 1 refills | Status: DC

## 2017-07-22 NOTE — Telephone Encounter (Signed)
New Message:   Pt wants to know if he should cotinue taking his second Ramipril that he started taking this weekend?

## 2017-07-22 NOTE — Telephone Encounter (Signed)
Spoke with pt and went over recommendations per Dr. Tamala Julian.  Pt verbalized understanding and was in agreement with this plan.  Pt has appt with Dr. Tamala Julian on 9/10.  Will plan BMET at that time. Advised to monitor BP 2 hrs after medication and record and bring those readings to appt.  Pt appreciative for call.

## 2017-07-22 NOTE — Telephone Encounter (Signed)
PCP told pt on 8/31 to start taking Ramipril 10mg  BID.  Do you want him to continue this as well?

## 2017-07-23 NOTE — Telephone Encounter (Signed)
Yes. Needs a basic metabolic panel done one week after changing to higher dose.

## 2017-07-23 NOTE — Telephone Encounter (Signed)
Spoke with pt and made him aware of Dr. Thompson Caul recommendations.  Pt has appt on Monday and will have labs drawn at that time.

## 2017-07-24 ENCOUNTER — Encounter (HOSPITAL_COMMUNITY): Payer: Self-pay | Admitting: Emergency Medicine

## 2017-07-24 ENCOUNTER — Telehealth: Payer: Self-pay | Admitting: Interventional Cardiology

## 2017-07-24 DIAGNOSIS — Z7982 Long term (current) use of aspirin: Secondary | ICD-10-CM | POA: Diagnosis not present

## 2017-07-24 DIAGNOSIS — I10 Essential (primary) hypertension: Secondary | ICD-10-CM | POA: Diagnosis not present

## 2017-07-24 DIAGNOSIS — E785 Hyperlipidemia, unspecified: Secondary | ICD-10-CM | POA: Insufficient documentation

## 2017-07-24 DIAGNOSIS — Z79899 Other long term (current) drug therapy: Secondary | ICD-10-CM | POA: Insufficient documentation

## 2017-07-24 DIAGNOSIS — Z87891 Personal history of nicotine dependence: Secondary | ICD-10-CM | POA: Diagnosis not present

## 2017-07-24 NOTE — Telephone Encounter (Signed)
Just followed up with pt to check BP and it is now 202/106.  Pt remains asymptomatic.  Advised I would send message to Dr. Tamala Julian for review and advisement.

## 2017-07-24 NOTE — Telephone Encounter (Signed)
Operator called pt to confirm Monday appt and he mentioned elevated BP.  Pt checked BP this AM and it was 219/103.  This was prior to medications.  He immediately took meds after this.  Denies any sx.  Pt took medications as prescribed yesterday and BP at Noon was 130/68, BP at 11PM was 210/104, HR 69.  Pt concerned that BP is remaining elevated.  Advised pt I would call back in 2 hours and get update on BP since he just took meds.  Advised in the meantime if sx develop in the meantime to go ahead and call me back.  If CP report to ED.  Pt verbalized understanding and was in agreement with this plan.

## 2017-07-24 NOTE — ED Triage Notes (Signed)
Reports elevated bp for last 8 days.  Family physician has increased medications with no decrease in bp noted.  Reports only symptom is hearing heartbeat.  No other complaints voiced.  Took metoprolol 50mg  and ramipril 10mg  at 2000.

## 2017-07-24 NOTE — Telephone Encounter (Signed)
NEw message  Pt c/o BP issue: STAT if pt c/o blurred vision, one-sided weakness or slurred speech  1. What are your last 5 BP readings? 219/103  2. Are you having any other symptoms (ex. Dizziness, headache, blurred vision, passed out)? No   3. What is your BP issue? high bp

## 2017-07-24 NOTE — Telephone Encounter (Signed)
Thanks for getting this taken care of. Remind the patient that blood pressures should be taken one to 2 hours after medications are given. He should not take a early morning blood pressure before having therapy.

## 2017-07-24 NOTE — Telephone Encounter (Signed)
Spoke with pt and he checked BP at 3pm and it was 184/87.  Checked again at 3:15pm and it was 144/76.  Pt feeling less worried now.  Reminded pt of medication instructions per Dr. Lovena Le for the weekend.  Educated pt on what to do if BP spikes again or drops too low.  Pt verbalized understanding and was appreciative for everything today.

## 2017-07-24 NOTE — Telephone Encounter (Signed)
Wife called, DPR on file.  States pt rechecked BP and it is now 229/111.  Still asymptomatic.  Spoke with Dr. Lovena Le, DOD and he advised pt take another Amlodipine 5mg  now and then take Amlodipine 10mg  tomorrow and Sunday until he is seen on Monday by Dr. Tamala Julian.  Spoke with wife and reviewed recommendations.  Advised I would also call back later to check in on pt.  Advised if sx developed to report to ER.  Wife verbalized understanding and was in agreement with this plan.

## 2017-07-25 ENCOUNTER — Emergency Department (HOSPITAL_COMMUNITY)
Admission: EM | Admit: 2017-07-25 | Discharge: 2017-07-25 | Disposition: A | Discharge status: Home / Self Care | Payer: Medicare Other | Attending: Emergency Medicine | Admitting: Emergency Medicine

## 2017-07-25 DIAGNOSIS — I10 Essential (primary) hypertension: Secondary | ICD-10-CM

## 2017-07-25 NOTE — ED Provider Notes (Addendum)
[facility] DEPT Provider Note   CSN: [id number] Arrival date & time: 07/24/17  2209     History   Chief Complaint Chief Complaint  Patient presents with  . Hypertension    HPI Andre Miranda is a 81 y.o. male.  81 yo M with a chief complaint of asymptomatic hypertension. This been going on for the past 8 days or so. Recently had his medications changed. He has been rechecking his blood pressure without improvement. He denies any visual changes any headaches any chest pain or shortness of breath. Denies lower extremity edema. Denies recent trauma. He called the nurse line for his cardiologist who suggest he come to the ED.   The history is provided by the patient.  Hypertension  This is a new problem. The current episode started more than 1 week ago. The problem occurs constantly. The problem has not changed since onset.Pertinent negatives include no chest pain, no abdominal pain, no headaches and no shortness of breath. Nothing aggravates the symptoms. Nothing relieves the symptoms. He has tried nothing for the symptoms. The treatment provided no relief.  Illness  Pertinent negatives include no chest pain, no abdominal pain, no headaches and no shortness of breath.    Past Medical History:  Diagnosis Date  . Aortic stenosis    MODERATE  . Carotid arterial disease ([place])   . Dysuria   . Gout   . Hematuria   . Hyperlipidemia   . Hypertension   . Obesity   . Sleep apnea     Patient Active Problem List   Diagnosis Date Noted  . Right-sided chest wall pain 09/28/2014  . Occlusion and stenosis of carotid artery without mention of cerebral infarction 03/28/2014  . Acute bronchitis with asthma 08/25/2012  . Multiple lung nodules 06/20/2012  . Hyperlipidemia 10/02/2011  . CAD (coronary artery disease) 04/04/2011  . Hypertension   . Obesity   . Dysuria   . Hematuria   . Aortic stenosis   . Obstructive sleep apnea   . Gout   . Carotid arterial disease ([place])      Past Surgical History:  Procedure Laterality Date  . AORTIC VALVE REPAIR  February 06, 2015   Dr. Aline Brochure and Dr. Ysidro Evert  at [facility]  . [place]  02/2008  . CHOLECYSTECTOMY  04/2002  . CORONARY ARTERY BYPASS GRAFT  2003   LIMA TO THE LAD, RIMA TO THE RCA, AND A SAPHENOUS VEIN GRAFT TO THE INTERMEDIATE AND DISTAL LEFT CIRCUMFLEX  . US ECHOCARDIOGRAPHY  08/2009   SHOWED A MEAN GRADIENT ACROSS IS AORTIC VALVE OF 24 MM OF MERCURY. HE HAD MODERATE LVH AND NORMAL LV FUNCTION       Home Medications    Prior to Admission medications   Medication Sig Start Date End Date Taking? Authorizing Provider  acetaminophen (TYLENOL) 325 MG tablet Take 650 mg by mouth every 6 (six) hours as needed.    [provider]  allopurinol (ZYLOPRIM) 300 MG tablet Take 300 mg by mouth daily.      [provider]  amLODipine (NORVASC) 5 MG tablet Take 1 tablet (5 mg total) by mouth daily. 07/22/17 10/20/17  Belva Crome, MD  aspirin EC 81 MG tablet Take 1 tablet (81 mg total) by mouth daily. 11/23/12   Larey Dresser, MD  Cholecalciferol (VITAMIN D3) 1000 UNITS CAPS Take by mouth daily.      [provider]  clindamycin (CLEOCIN) 150 MG capsule Take 150 mg by mouth  as needed. 4 Capsules 41mins prior to dental appt  (600mg )    [provider]  clindamycin (CLEOCIN) 300 MG capsule Take 300 mg by mouth as needed. Reported on 04/15/2016    [provider]  CRESTOR 20 MG tablet Take 0.5 tablets by mouth Daily. Pt takes generic - rosuvastin  1/2 daily 08/27/11   [provider]  famotidine (PEPCID) 20 MG tablet Take 20 mg by mouth daily.      [provider]  hydrochlorothiazide 25 MG tablet Take 25 mg by mouth daily.      [provider]  metoprolol (LOPRESSOR) 50 MG tablet Take 50 mg by mouth 2 (two) times daily.      [provider]  Multiple Vitamins-Minerals (CENTRUM SILVER) tablet Take 1 tablet by mouth daily.       [provider]  ramipril (ALTACE) 10 MG tablet Take 10 mg by mouth daily.      [provider]  Sennosides (SENOKOT PO) Take by mouth as needed. FOR CONSTIPATION    [provider]  Tamsulosin HCl (FLOMAX) 0.4 MG CAPS Take 0.4 mg by mouth daily.      [provider]    Family History Family History  Problem Relation Age of Onset  . Heart attack Father   . Pneumonia Mother 5    Social History Social History  Substance Use Topics  . Smoking status: Former Smoker    Packs/day: 1.50    Years: 7.00    Types: Cigarettes    Quit date: 04/03/1971  . Smokeless tobacco: Never Used  . Alcohol use Yes     Allergies   Ibuprofen and Penicillin g   Review of Systems Review of Systems  Constitutional: Negative for chills and fever.  HENT: Negative for congestion and facial swelling.   Eyes: Negative for discharge and visual disturbance.  Respiratory: Negative for shortness of breath.   Cardiovascular: Negative for chest pain and palpitations.  Gastrointestinal: Negative for abdominal pain, diarrhea and vomiting.  Musculoskeletal: Negative for arthralgias and myalgias.  Skin: Negative for color change and rash.  Neurological: Negative for tremors, syncope and headaches.  Psychiatric/Behavioral: Negative for confusion and dysphoric mood.     Physical Exam Updated Vital Signs BP (!) 160/57 (BP Location: Right Arm)   Pulse (!) 56   Temp 98.1 F (36.7 C) (Oral)   Resp 18   Ht 5\' 11"  (1.803 m)   Wt 113.4 kg (250 lb)   SpO2 96%   BMI 34.87 kg/m   Physical Exam  Constitutional: He is oriented to person, place, and time. He appears well-developed and well-nourished.  HENT:  Head: Normocephalic and atraumatic.  Eyes: Pupils are equal, round, and reactive to light. EOM are normal.  OD normal bilaterally  Neck: Normal range of motion. Neck supple. No JVD present.  Cardiovascular: Normal rate and regular rhythm.  Exam reveals no gallop and no  friction rub.   No murmur heard. Pulmonary/Chest: No respiratory distress. He has no wheezes.  Abdominal: He exhibits no distension and no mass. There is no tenderness. There is no rebound and no guarding.  Musculoskeletal: Normal range of motion.  Neurological: He is alert and oriented to person, place, and time. He has normal strength. No cranial nerve deficit or sensory deficit. He displays a negative Romberg sign. Coordination and gait normal. GCS eye subscore is 4. GCS verbal subscore is 5. GCS motor subscore is 6.  Skin: No rash noted. No pallor.  Psychiatric:  He has a normal mood and affect. His behavior is normal.  Nursing note and vitals reviewed.    ED Treatments / Results  Labs (all labs ordered are listed, but only abnormal results are displayed) Labs Reviewed - No data to display  EKG  EKG Interpretation None       Radiology No results found.  Procedures Procedures (including critical care time)  Medications Ordered in ED Medications - No data to display   Initial Impression / Assessment and Plan / ED Course  I have reviewed the triage vital signs and the nursing notes.  Pertinent labs & imaging results that were available during my care of the patient were reviewed by me and considered in my medical decision making (see chart for details).     81 yo M With asymptomatic hypertension. Discharge home.  Patient asymptomatic with no noted s/s of end organ damage.  No chest pain, diaphoresis, nausea or other acs symptoms.  No headache or neurologic complaints, normal fundiscopic exam, no unequal pulses, normal pulse ox without rales or sob.  Feel this is unlikely to be a Hypertensive Emergency and recent studies suggest no benefit for inpatient admission.  There are also no studies to my knowledge suggesting that patients with hypertensive urgency have increased risk for end organ disease.The patient will follow up closely with their PCP.  Compliance with their  medication stressed.    Lowell Guitar, Cicero Duck EH, et al. Characteristics and outcomes of patients presenting with hypertensive urgency in the office setting. JAMA Intern Med. 2016 Jul 1; 176(7): 981-8.   2:51 AM:  I have discussed the diagnosis/risks/treatment options with the patient and family and believe the pt to be eligible for discharge home to follow-up with PCP, Cards. We also discussed returning to the ED immediately if new or worsening sx occur. We discussed the sx which are most concerning (e.g., stroke s/sx, chest pain, sob, leg swelling) that necessitate immediate return. Medications administered to the patient during their visit and any new prescriptions provided to the patient are listed below.  Medications given during this visit Medications - No data to display   The patient appears reasonably screen and/or stabilized for discharge and I doubt any other medical condition or other [facility] requiring further screening, evaluation, or treatment in the ED at this time prior to discharge.    Final Clinical Impressions(s) / ED Diagnoses   Final diagnoses:  Asymptomatic hypertension    New Prescriptions New Prescriptions   No medications on file     Deno Etienne, DO 07/25/17 [place], [place], DO 07/25/17 [id number]

## 2017-07-26 NOTE — Progress Notes (Signed)
Cardiology Office Note    Date:  07/27/2017   ID:  Andre Miranda, DOB 1936-05-20, MRN [id number]  PCP:  Crist Infante, MD  Cardiologist: Sinclair Grooms, MD   Chief Complaint  Patient presents with  . Cardiac Valve Problem    TAVR  . Follow-up    Severe hypertension    History of Present Illness:  Andre Miranda is a 81 y.o. male history of CAD s/p CABG 2003 and aortic stenosis s/p TAVR 2016 [place], hyperlipidemia, and most recently difficulty with severe blood pressure elevation. Previously followed by Dr. Aundra Dubin.  Previously followed as noted above. No specific cardiac complaints but has noticed severe elevation in blood pressure recently, last 2 weeks. At times systolic is greater than 397. Medications have been gradually uptitrated over several phone conversations including grandma Prill to 20 mg per day and amlodipine was started and is now at 10 mg daily. Pressures of still been elevated but not symptomatic.  Has history of remote coronary bypass grafting 2003 and have her done most recently in 2016 at [facility]  Past Medical History:  Diagnosis Date  . Aortic stenosis    MODERATE  . Carotid arterial disease ([place])   . Dysuria   . Gout   . Hematuria   . Hyperlipidemia   . Hypertension   . Obesity   . Sleep apnea     Past Surgical History:  Procedure Laterality Date  . AORTIC VALVE REPAIR  February 06, 2015   Dr. Aline Brochure and Dr. Ysidro Evert  at [facility]  . [place]  02/2008  . CHOLECYSTECTOMY  04/2002  . CORONARY ARTERY BYPASS GRAFT  2003   LIMA TO THE LAD, RIMA TO THE RCA, AND A SAPHENOUS VEIN GRAFT TO THE INTERMEDIATE AND DISTAL LEFT CIRCUMFLEX  . US ECHOCARDIOGRAPHY  08/2009   SHOWED A MEAN GRADIENT ACROSS IS AORTIC VALVE OF 24 MM OF MERCURY. HE HAD MODERATE LVH AND NORMAL LV FUNCTION    Current Medications: Outpatient Medications Prior to Visit  Medication Sig Dispense Refill  . acetaminophen (TYLENOL) 325 MG tablet  Take 650 mg by mouth every 6 (six) hours as needed.    Marland Kitchen allopurinol (ZYLOPRIM) 300 MG tablet Take 300 mg by mouth daily.      Marland Kitchen aspirin EC 81 MG tablet Take 1 tablet (81 mg total) by mouth daily.    . Cholecalciferol (VITAMIN D3) 1000 UNITS CAPS Take by mouth daily.      . clindamycin (CLEOCIN) 150 MG capsule Take 150 mg by mouth as needed. 4 Capsules 81mins prior to dental appt  (600mg )    . CRESTOR 20 MG tablet Take 0.5 tablets by mouth Daily. Pt takes generic - rosuvastin  1/2 daily    . famotidine (PEPCID) 20 MG tablet Take 20 mg by mouth daily.      . hydrochlorothiazide 25 MG tablet Take 25 mg by mouth daily.      . metoprolol (LOPRESSOR) 50 MG tablet Take 50 mg by mouth 2 (two) times daily.      . Multiple Vitamins-Minerals (CENTRUM SILVER) tablet Take 1 tablet by mouth daily.      . Sennosides (SENOKOT PO) Take by mouth as needed. FOR CONSTIPATION    . Tamsulosin HCl (FLOMAX) 0.4 MG CAPS Take 0.4 mg by mouth daily.      Marland Kitchen amLODipine (NORVASC) 5 MG tablet Take 1 tablet (5 mg total) by mouth daily. (Patient not taking: Reported on 07/27/2017) 90  tablet 1  . clindamycin (CLEOCIN) 300 MG capsule Take 300 mg by mouth as needed. Reported on 04/15/2016    . ramipril (ALTACE) 10 MG tablet Take 10 mg by mouth daily.       No facility-administered medications prior to visit.      Allergies:   Ibuprofen   Social History   Social History  . Marital status: Married    Spouse name: N/A  . Number of children: N/A  . Years of education: N/A   Social History Main Topics  . Smoking status: Former Smoker    Packs/day: 1.50    Years: 7.00    Types: Cigarettes    Quit date: 04/03/1971  . Smokeless tobacco: Never Used  . Alcohol use Yes  . Drug use: No  . Sexual activity: Not Asked   Other Topics Concern  . None   Social History Narrative  . None     Family History:  The patient's family history includes Heart attack in his father; Pneumonia (age of onset: 8) in his mother.  there is  a family history of high blood pressure.  ROS:   Please see the history of present illness. not sleeping well. Awakens multiple times each night. Frequently has to urinate when he awakens. All other systems reviewed and are negative.   PHYSICAL EXAM:   VS:  BP 136/76 (BP Location: Left Arm)   Pulse 60   Ht 5\' 10"  (1.778 m)   Wt 251 lb 1.9 oz (113.9 kg)   BMI 36.03 kg/m    GEN: Well nourished, well developed, in no acute distress  HEENT: normal  Neck: no JVD, carotid bruits, or masses Cardiac:RRR With a soft scratchy 2/6 systolic murmur at the right upper sternal border. No diastolic murmur. No rubs, or gallops,no edema  Respiratory:  clear to auscultation bilaterally, normal work of breathing GI: soft, nontender, nondistended, + BS MS: no deformity or atrophy  Skin: warm and dry, no rash Neuro:  Alert and Oriented x 3, Strength and sensation are intact Psych: euthymic mood, full affect  Wt Readings from Last 3 Encounters:  07/27/17 251 lb 1.9 oz (113.9 kg)  07/24/17 250 lb (113.4 kg)  05/19/17 255 lb (115.7 kg)      Studies/Labs Reviewed:   EKG:  EKG  Sinus rhythm with right bundle branch block. No change compared to prior. PR interval 210 ms.  Recent Labs: No results found for requested labs within last 8760 hours.   Lipid Panel    Component Value Date/Time   CHOL 109 11/29/2014 1630   TRIG 104.0 11/29/2014 1630   HDL 42.40 11/29/2014 1630   CHOLHDL 3 11/29/2014 1630   VLDL 20.8 11/29/2014 1630   LDLCALC 46 11/29/2014 1630    Additional studies/ records that were reviewed today include:  [facility]chocardiogram at [facility] April 2018: INTERPRETATION --------------------------------------------------------------- NORMAL LEFT VENTRICULAR SYSTOLIC FUNCTION WITH MILD LVH NORMAL RIGHT VENTRICULAR SYSTOLIC FUNCTION VALVULAR REGURGITATION: TRIVIAL MR, TRIVIAL PR, TRIVIAL TR  Compared with prior Echo study on 07/27/2015: NO SIGNIFICANT CHANGE  IN AORTIC GRADIENT. Patient had [place] 2016.   ASSESSMENT:    1. Nonrheumatic aortic valve stenosis   2. Essential hypertension   3. Coronary artery disease involving native coronary artery of native heart without angina pectoris   4. Bilateral carotid artery disease ([place])   5. Obstructive sleep apnea   6. Other hyperlipidemia   7. Accelerated hypertension      PLAN:  In order of problems  listed above:  1. Normally functioning bioprosthetic aortic valve placed by transaortic valve replacement 2016. Recent echo demonstrates normal function. 2. Accelerated blood pressure uncertain etiology. Bilateral renal artery duplex scan, serum aldosterone/renin, and cortisol levels will be obtained. A basic metabolic panel will be obtained and if safe, we will add Aldactone 25 mg per day to the current regimen. He will have blood pressure clinic follow-up/follow-up with me in 2 weeks. 3. Stable without angina 4. No neurological complaints 5. Compliance with CP. 6. Not addressed 7. Addressed under #2   greater than 50% of the office visit was spent in counseling and organizing management and follow-up.   Medication Adjustments/Labs and Tests Ordered: Current medicines are reviewed at length with the patient today.  Concerns regarding medicines are outlined above.  Medication changes, Labs and Tests ordered today are listed in the Patient Instructions below. Patient Instructions  Medication Instructions:  1) START Aldactone 25mg  once daily.  Do not pick this up until we call you about your lab work.  Labwork: Cortisol, Aldosterone and CMET today  Testing/Procedures: Your physician has requested that you have a renal artery duplex. During this test, an ultrasound is used to evaluate blood flow to the kidneys. Allow one hour for this exam. Do not eat after midnight the day before and avoid carbonated beverages. Take your medications as you usually do.   Follow-Up: Your physician recommends  that you schedule a follow-up appointment in: 2 weeks with Hypertension Clinic.    Any Other Special Instructions Will Be Listed Below (If Applicable).     If you need a refill on your cardiac medications before your next appointment, please call your pharmacy.      Signed, Sinclair Grooms, MD  07/27/2017 10:39 AM    [facility] [address], [place], [place]  [address] Phone: [phone]; Fax: [phone]

## 2017-07-27 ENCOUNTER — Encounter: Payer: Self-pay | Admitting: Interventional Cardiology

## 2017-07-27 ENCOUNTER — Ambulatory Visit (INDEPENDENT_AMBULATORY_CARE_PROVIDER_SITE_OTHER): Payer: Medicare Other | Admitting: Interventional Cardiology

## 2017-07-27 VITALS — BP 136/76 | HR 60 | Ht 70.0 in | Wt 251.1 lb

## 2017-07-27 DIAGNOSIS — I10 Essential (primary) hypertension: Secondary | ICD-10-CM

## 2017-07-27 DIAGNOSIS — I350 Nonrheumatic aortic (valve) stenosis: Secondary | ICD-10-CM | POA: Diagnosis not present

## 2017-07-27 DIAGNOSIS — I739 Peripheral vascular disease, unspecified: Secondary | ICD-10-CM

## 2017-07-27 DIAGNOSIS — G4733 Obstructive sleep apnea (adult) (pediatric): Secondary | ICD-10-CM

## 2017-07-27 DIAGNOSIS — E784 Other hyperlipidemia: Secondary | ICD-10-CM | POA: Diagnosis not present

## 2017-07-27 DIAGNOSIS — I779 Disorder of arteries and arterioles, unspecified: Secondary | ICD-10-CM | POA: Diagnosis not present

## 2017-07-27 DIAGNOSIS — Z952 Presence of prosthetic heart valve: Secondary | ICD-10-CM | POA: Insufficient documentation

## 2017-07-27 DIAGNOSIS — I2510 Atherosclerotic heart disease of native coronary artery without angina pectoris: Secondary | ICD-10-CM

## 2017-07-27 DIAGNOSIS — I6521 Occlusion and stenosis of right carotid artery: Secondary | ICD-10-CM | POA: Diagnosis not present

## 2017-07-27 DIAGNOSIS — E7849 Other hyperlipidemia: Secondary | ICD-10-CM

## 2017-07-27 MED ORDER — SPIRONOLACTONE 25 MG PO TABS: 25.0000 mg | ORAL_TABLET | Freq: Every day | ORAL | 3 refills | Status: DC

## 2017-07-27 NOTE — Patient Instructions (Signed)
Medication Instructions:  1) START Aldactone 25mg  once daily.  Do not pick this up until we call you about your lab work.  Labwork: Cortisol, Aldosterone and CMET today  Testing/Procedures: Your physician has requested that you have a renal artery duplex. During this test, an ultrasound is used to evaluate blood flow to the kidneys. Allow one hour for this exam. Do not eat after midnight the day before and avoid carbonated beverages. Take your medications as you usually do.   Follow-Up: Your physician recommends that you schedule a follow-up appointment in: 2 weeks with Hypertension Clinic.    Any Other Special Instructions Will Be Listed Below (If Applicable).     If you need a refill on your cardiac medications before your next appointment, please call your pharmacy.

## 2017-07-29 ENCOUNTER — Ambulatory Visit (HOSPITAL_COMMUNITY)
Admission: RE | Admit: 2017-07-29 | Discharge: 2017-07-29 | Disposition: A | Discharge status: Home / Self Care | Payer: Medicare Other | Source: Ambulatory Visit | Attending: Cardiovascular Disease | Admitting: Cardiovascular Disease

## 2017-07-29 DIAGNOSIS — I10 Essential (primary) hypertension: Secondary | ICD-10-CM | POA: Insufficient documentation

## 2017-07-29 LAB — COMPREHENSIVE METABOLIC PANEL
ALT: 23 IU/L (ref 0–44)
AST: 22 IU/L (ref 0–40)
Albumin/Globulin Ratio: 2.1 (ref 1.2–2.2)
Albumin: 4.4 g/dL (ref 3.5–4.8)
Alkaline Phosphatase: 74 IU/L (ref 39–117)
BUN / CREAT RATIO: 24 (ref 10–24)
BUN: 22 mg/dL (ref 8–27)
Bilirubin Total: 1.1 mg/dL (ref 0.0–1.2)
CO2: 24 mmol/L (ref 20–29)
CREATININE: 0.93 mg/dL (ref 0.76–1.27)
Calcium: 9.3 mg/dL (ref 8.6–10.2)
Chloride: 101 mmol/L (ref 96–106)
GFR calc Af Amer: 91 mL/min/{1.73_m2} (ref >59)
GFR, EST NON AFRICAN AMERICAN: 78 mL/min/{1.73_m2} (ref >59)
GLOBULIN, TOTAL: 2.1 g/dL (ref 1.5–4.5)
Glucose: 93 mg/dL (ref 65–99)
POTASSIUM: 4.2 mmol/L (ref 3.5–5.2)
SODIUM: 141 mmol/L (ref 134–144)
Total Protein: 6.5 g/dL (ref 6.0–8.5)

## 2017-07-29 LAB — ALDOSTERONE + RENIN ACTIVITY W/ RATIO
ALDOS/RENIN RATIO: 3.1 (ref 0.0–30.0)
ALDOSTERONE: 4.9 ng/dL (ref 0.0–30.0)
RENIN: 1.587 ng/mL/h (ref 0.167–5.380)

## 2017-07-29 LAB — CORTISOL: Cortisol: 7 ug/dL

## 2017-07-31 ENCOUNTER — Telehealth: Payer: Self-pay | Admitting: Interventional Cardiology

## 2017-07-31 NOTE — Telephone Encounter (Signed)
Pt states [facility] called to let him know that his prescription was ready and he wanted to know what it was.  Advised I'm not sure but it could be the Spironolactone that we called in on 9/10.  Pt knows not to pick this up though until Dr. Tamala Julian reviews his renal US.  Pt verbalized understanding and was appreciative for call.  Advised I would call with renal US results as soon as Dr. Tamala Julian reviews them.

## 2017-07-31 NOTE — Telephone Encounter (Signed)
New message      Pt states that [facility] called to let him know he had a presc ready.  Pt want renal artery ultrasound results.  Also, he want to know what medication is ready at the pharmacy?

## 2017-08-03 ENCOUNTER — Telehealth: Payer: Self-pay | Admitting: Interventional Cardiology

## 2017-08-03 DIAGNOSIS — I10 Essential (primary) hypertension: Secondary | ICD-10-CM

## 2017-08-03 NOTE — Telephone Encounter (Signed)
F/u message ° °Pt returning RN call .please call back to discuss  °

## 2017-08-03 NOTE — Telephone Encounter (Signed)
Spoke with Andre Miranda and went over renal US results.  Andre Miranda verbalized understanding.  Andre Miranda wants to know if Dr. Tamala Julian wants him to start the Spironolactone 25mg  QD?  Advised I would speak with Dr. Tamala Julian and let him know.

## 2017-08-03 NOTE — Telephone Encounter (Signed)
Spoke with Dr. Tamala Julian and he does want pt to go ahead and start Spironolactone and check BMET in 7-10 days.  Advised pt of this and told him to plan to get BMET same day as HTN clinic appt.  Pt verbalized understanding and was in agreement with this plan.

## 2017-08-12 NOTE — Progress Notes (Addendum)
Patient ID: DENSIL OTTEY                 DOB: 04/01/1936                      MRN: [id number]     HPI: Andre Miranda is a 81 y.o. male of Dr Tamala Julian with [place] significant for CAD s/p CABG 2003 and aortic stenosis s/p TAVR 2016 at [facility], hyperlipidemia, prostate cancer s/p radiation, and most recently difficulty with blood pressure elevation. He has presented with no acute cardiac complaints but has noticed severe elevation in blood pressure recently. At times systolic had been greater than 200 mmHg. Medications have gradually uptitrated over several phone conversations including ramipril 20 mg per day and amlodipine 10 mg daily. When pressures are high he is historically asymptomatic. At his OV in 07/2017 with Dr. Tamala Julian , blood pressure was 136/76 mmHg. Spironolactone 25 mg daily was added. He was instructed to take his blood pressure around 2 hours after medications are given.  --- Today patient presents to HTN clinic with no acute complaints. He denies dizziness, lightheadedness, headaches, or recent falls. He presents with the main complaint of insomnia and has been taking Tylenol PM every night to assist with this. We went through sleep hygiene and he was encouraged to adopt a bedtime routine that reduced blue light from screens and allowed for a wind down period such as reading. He provided a blood pressure log that showed extremely variable blood pressure readings with readings reported below (AM readings are two hours after medication administration, PM readings are simultaneous with evening medication dosing). He states that he just recently got a new blood pressure cuff so does not think it needs calibration but is willing to bring it in to the next visit to verify. He states his wife also uses this cuff and has no issues with variability in her readings. We discussed extensively lifestyle modifications that could be adopted to lower his blood pressure and we focused most specifically on  reducing caffeine intake, reducing salt intake, and exercising more.   Current HTN meds: amlodipine 10 mg daily, HCTZ 25 mg daily, lopressor 50 mg twice daily, ramipril 10 mg twice daily, spironolactone 25 mg daily   BP goal:  <130/80 mmHg  Family History: The patient's family history includes Heart attack in his father; Pneumonia (age of onset: 56) in his mother. There is a family history of high blood pressure.  Social History: Married, former smoker (1.5 packs/day for 7 years) quit in 1972, never used smokeless tobacco, social alcohol user, no illicit drug use   Diet: Atkins diet. Meat/eggs. Vegetables/fruit. Trying to cut down on red meat. Loves seafood. Eats out with his wife most often. Adds salt to his food. Coffee drinker (3-4 in the morning), decaf in the evening. Last caffeinated drink of the day 10:30am.  Exercise: Loves walking- going to try to incorporate that more. Going to try to start going to the [employer] and other gym classes.  Home BP readings:   Date: (AM), (PM) 9/23: 143/74, 182/82 9/24: 157/81, 212/88 9/25: 125/66, 192/90 9/26: 154/79, 192/90 In the past month: 3 instances of >200/90 1 instance of 125/64 (his lowest reading - asymptomatic)  Wt Readings from Last 3 Encounters:  07/27/17 251 lb 1.9 oz (113.9 kg)  07/24/17 250 lb (113.4 kg)  05/19/17 255 lb (115.7 kg)   BP Readings from Last 3 Encounters:  07/27/17 136/76  07/25/17 (!) 160/57  05/19/17 (!) 159/81   Pulse Readings from Last 3 Encounters:  07/27/17 60  07/25/17 (!) 56  05/19/17 (!) 53    Renal function: CrCl cannot be calculated (Unknown ideal weight.).  Past Medical History:  Diagnosis Date  . Aortic stenosis    MODERATE  . Carotid arterial disease ([place])   . Dysuria   . Gout   . Hematuria   . Hyperlipidemia   . Hypertension   . Obesity   . Sleep apnea     Current Outpatient Prescriptions on File Prior to Visit  Medication Sig Dispense Refill  . acetaminophen  (TYLENOL) 325 MG tablet Take 650 mg by mouth every 6 (six) hours as needed.    Marland Kitchen allopurinol (ZYLOPRIM) 300 MG tablet Take 300 mg by mouth daily.      Marland Kitchen amLODipine (NORVASC) 5 MG tablet Take 10 mg by mouth daily.    Marland Kitchen aspirin EC 81 MG tablet Take 1 tablet (81 mg total) by mouth daily.    . Cholecalciferol (VITAMIN D3) 1000 UNITS CAPS Take by mouth daily.      . clindamycin (CLEOCIN) 150 MG capsule Take 150 mg by mouth as needed. 4 Capsules 52mins prior to dental appt  (600mg )    . CRESTOR 20 MG tablet Take 0.5 tablets by mouth Daily. Pt takes generic - rosuvastin  1/2 daily    . famotidine (PEPCID) 20 MG tablet Take 20 mg by mouth daily.      . hydrochlorothiazide 25 MG tablet Take 25 mg by mouth daily.      . metoprolol (LOPRESSOR) 50 MG tablet Take 50 mg by mouth 2 (two) times daily.      . Multiple Vitamins-Minerals (CENTRUM SILVER) tablet Take 1 tablet by mouth daily.      . ramipril (ALTACE) 10 MG capsule Take 10 mg by mouth 2 (two) times daily.    . Sennosides (SENOKOT PO) Take by mouth as needed. FOR CONSTIPATION    . spironolactone (ALDACTONE) 25 MG tablet Take 1 tablet (25 mg total) by mouth daily. 90 tablet 3  . Tamsulosin HCl (FLOMAX) 0.4 MG CAPS Take 0.4 mg by mouth daily.       No current facility-administered medications on file prior to visit.     Allergies  Allergen Reactions  . Ibuprofen Hypertension    Assessment/Plan:  1. Hypertension: Blood pressure is within goal today of <130/80 mmHg at 128/68 mmHg and then a repeat confirmation check at 126/64 mmHg. As his blood pressure readings are extremely variable, optimizing treatment is challenging. Will move amlodipine administration from the morning to the evening to provide better night time coverage. BMET scheduled for today. If labs are within normal limits, will increase spironolactone to 50 mg daily with repeat BMET in around 1 week.   HTN clinic follow-up in 3 weeks. Patient agreed to bring in home cuff to calibrate  at this time. May need afternoon/evening hydralazine dosing to target elevated BP readings at night.   Andre Miranda, PharmD, [place] PGY1 Pharmacy Resident [facility]   Addendum: Spoke with pt and advised him to increase spironolactone to 50mg  daily since BMET is stable. F/u BMET in 10 days.  Pt called back to clinic - states his morning BP was 093O systolic, it increased to the 180s at night, and he went to his urologist where his BP was reported as 90s/60s. Given this low reading, pt would like to hold off on increasing his spironolactone dose over the  weekend. Advised pt to call next week to discuss - may need to start hydralazine PM dosing instead.

## 2017-08-13 ENCOUNTER — Other Ambulatory Visit: Payer: Medicare Other | Admitting: *Deleted

## 2017-08-13 ENCOUNTER — Ambulatory Visit (INDEPENDENT_AMBULATORY_CARE_PROVIDER_SITE_OTHER): Payer: Medicare Other | Admitting: Pharmacist

## 2017-08-13 VITALS — BP 126/64 | HR 55

## 2017-08-13 DIAGNOSIS — I10 Essential (primary) hypertension: Secondary | ICD-10-CM | POA: Diagnosis not present

## 2017-08-13 DIAGNOSIS — I6521 Occlusion and stenosis of right carotid artery: Secondary | ICD-10-CM | POA: Diagnosis not present

## 2017-08-13 NOTE — Patient Instructions (Addendum)
It was nice to meet you today!   Start taking your amlodipine 10 mg daily in the evening.   We will call you tomorrow with your lab results and will give you further instructions on that time of any other medication changes and needed lab visits.  Follow-up with [place] in Hypertension clinic in 3 weeks. Please bring your cuff to this visit.

## 2017-08-14 DIAGNOSIS — N3020 Other chronic cystitis without hematuria: Secondary | ICD-10-CM | POA: Diagnosis not present

## 2017-08-14 DIAGNOSIS — C61 Malignant neoplasm of prostate: Secondary | ICD-10-CM | POA: Diagnosis not present

## 2017-08-14 DIAGNOSIS — N35013 Post-traumatic anterior urethral stricture: Secondary | ICD-10-CM | POA: Diagnosis not present

## 2017-08-14 LAB — BASIC METABOLIC PANEL
BUN / CREAT RATIO: 22 (ref 10–24)
BUN: 25 mg/dL (ref 8–27)
CO2: 25 mmol/L (ref 20–29)
CREATININE: 1.15 mg/dL (ref 0.76–1.27)
Calcium: 9.6 mg/dL (ref 8.6–10.2)
Chloride: 101 mmol/L (ref 96–106)
GFR calc Af Amer: 70 mL/min/{1.73_m2} (ref >59)
GFR, EST NON AFRICAN AMERICAN: 61 mL/min/{1.73_m2} (ref >59)
GLUCOSE: 99 mg/dL (ref 65–99)
POTASSIUM: 4.4 mmol/L (ref 3.5–5.2)
SODIUM: 142 mmol/L (ref 134–144)

## 2017-08-14 MED ORDER — SPIRONOLACTONE 50 MG PO TABS: 50.0000 mg | ORAL_TABLET | Freq: Every day | ORAL | 11 refills | Status: DC

## 2017-08-14 NOTE — Addendum Note (Signed)
Addended by: SUPPLE, MEGAN E on: 08/14/2017 09:29 AM   Modules accepted: Orders

## 2017-08-20 ENCOUNTER — Telehealth: Payer: Self-pay | Admitting: Interventional Cardiology

## 2017-08-20 MED ORDER — SPIRONOLACTONE 25 MG PO TABS: 25.0000 mg | ORAL_TABLET | Freq: Every day | ORAL | 11 refills | Status: DC

## 2017-08-20 NOTE — Telephone Encounter (Signed)
°  New Prob  Pt requesting to speak to you regarding blood pressure solutions. Please call.

## 2017-08-20 NOTE — Telephone Encounter (Signed)
Returned call to pt. He states that over the past week, his BP have been: 167/71 (old cuff), morning manual readings of 130/68, 110/60, 100/54. Evening manual readings of 142/64, 136/60, 120/60. Advised him to continue taking his spironolactone 25mg  daily and to continue monitoring his BP. Will keep BP f/u at the end of the month.

## 2017-08-24 ENCOUNTER — Other Ambulatory Visit: Payer: Self-pay

## 2017-08-26 DIAGNOSIS — R7301 Impaired fasting glucose: Secondary | ICD-10-CM | POA: Diagnosis not present

## 2017-08-26 DIAGNOSIS — E7849 Other hyperlipidemia: Secondary | ICD-10-CM | POA: Diagnosis not present

## 2017-08-26 DIAGNOSIS — M109 Gout, unspecified: Secondary | ICD-10-CM | POA: Diagnosis not present

## 2017-08-26 DIAGNOSIS — Z0000 Encounter for general adult medical examination without abnormal findings: Secondary | ICD-10-CM | POA: Diagnosis not present

## 2017-08-26 DIAGNOSIS — I10 Essential (primary) hypertension: Secondary | ICD-10-CM | POA: Diagnosis not present

## 2017-09-02 ENCOUNTER — Other Ambulatory Visit: Payer: Self-pay | Admitting: Pharmacist

## 2017-09-02 DIAGNOSIS — R7301 Impaired fasting glucose: Secondary | ICD-10-CM | POA: Diagnosis not present

## 2017-09-02 DIAGNOSIS — E668 Other obesity: Secondary | ICD-10-CM | POA: Diagnosis not present

## 2017-09-02 DIAGNOSIS — Z6835 Body mass index (BMI) 35.0-35.9, adult: Secondary | ICD-10-CM | POA: Diagnosis not present

## 2017-09-02 DIAGNOSIS — I350 Nonrheumatic aortic (valve) stenosis: Secondary | ICD-10-CM | POA: Diagnosis not present

## 2017-09-02 DIAGNOSIS — R911 Solitary pulmonary nodule: Secondary | ICD-10-CM | POA: Diagnosis not present

## 2017-09-02 DIAGNOSIS — R808 Other proteinuria: Secondary | ICD-10-CM | POA: Diagnosis not present

## 2017-09-02 DIAGNOSIS — Z0000 Encounter for general adult medical examination without abnormal findings: Secondary | ICD-10-CM | POA: Diagnosis not present

## 2017-09-02 DIAGNOSIS — I2510 Atherosclerotic heart disease of native coronary artery without angina pectoris: Secondary | ICD-10-CM | POA: Diagnosis not present

## 2017-09-02 DIAGNOSIS — Z1389 Encounter for screening for other disorder: Secondary | ICD-10-CM | POA: Diagnosis not present

## 2017-09-02 DIAGNOSIS — C61 Malignant neoplasm of prostate: Secondary | ICD-10-CM | POA: Diagnosis not present

## 2017-09-02 DIAGNOSIS — R300 Dysuria: Secondary | ICD-10-CM | POA: Diagnosis not present

## 2017-09-02 DIAGNOSIS — K625 Hemorrhage of anus and rectum: Secondary | ICD-10-CM | POA: Diagnosis not present

## 2017-09-02 MED ORDER — AMLODIPINE BESYLATE 10 MG PO TABS: 10.0000 mg | ORAL_TABLET | Freq: Every day | ORAL | 3 refills | Status: DC

## 2017-09-03 ENCOUNTER — Ambulatory Visit: Payer: Self-pay

## 2017-09-04 DIAGNOSIS — Z23 Encounter for immunization: Secondary | ICD-10-CM | POA: Diagnosis not present

## 2017-09-09 ENCOUNTER — Ambulatory Visit (INDEPENDENT_AMBULATORY_CARE_PROVIDER_SITE_OTHER): Payer: Medicare Other | Admitting: Pharmacist

## 2017-09-09 VITALS — BP 114/66 | HR 52

## 2017-09-09 DIAGNOSIS — I6521 Occlusion and stenosis of right carotid artery: Secondary | ICD-10-CM

## 2017-09-09 DIAGNOSIS — I10 Essential (primary) hypertension: Secondary | ICD-10-CM | POA: Diagnosis not present

## 2017-09-09 NOTE — Patient Instructions (Signed)
It was nice to see you today  Your blood pressure was excellent at goal < 130/80  Continue taking your current medications  Call Megan in the blood pressure clinic with any concerns [phone]

## 2017-09-09 NOTE — Progress Notes (Signed)
Patient ID: Andre Miranda                 DOB: 1936/04/21                      MRN: [id number]     HPI: Andre Miranda a 81 y.o.maleof Dr Tamala Julian with [facility] significant for CAD s/p CABG 2003and aortic stenosis s/p TAVR 2016 at [facility], hyperlipidemia, prostate cancer s/p radiation, and most recently difficulty with blood pressure elevation. He has presented with no acute cardiac complaints but has noticed severe elevation in blood pressure recently. At times systolic had been greater than 200 mmHg, particularly in the evening. Medications have gradually uptitrated over several phone conversations including ramipril 20 mg per day and amlodipine 10 mg daily. When pressures are high he is historically asymptomatic. At his OV in 07/2017 with Dr. Tamala Julian , blood pressure was 136/76 mmHg. Spironolactone 25 mg daily was added. He was instructed to take his blood pressure around 2 hours after medications are given.   Pt purchased a new BP cuff to use with his old machine. He has also moved his amlodipine dosing to the evening. While evening BP readings still remain higher than AM readings, these changes have helped to improve BP control throughout the day and pt has not seen readings as high at night. He has been feeling well overall and denies dizziness, blurred vision, headache, or falls. He has been keeping a meticulous log of his BP readings over the past few weeks - checking his BP in the morning before meds and 2 hours after meds, and again at night before meds and 2 hours after meds. His wife has also checked his BP manually multiple times.  Manual home BP readings: 110-130s/50-60s  AM BP readings pre morning meds = 130-150/50-70  AM BP readings 2 hr post morning meds = 117-128/50-60s  PM BP readings pre evening meds: 155-170/70  PM BP readings 2 hr post evening meds: 122-144/60s  HR stable in the 50-60s  Pt brings his home cuff to clinic today. Home BP cuff read 122/62, HR 53 in clinic.  Manual check in office was close at 114/66, HR 52.  Current HTN meds: amlodipine 10 mg daily, HCTZ 25 mg daily, lopressor 50 mg twice daily, ramipril 10 mg twice daily, spironolactone 25 mg daily   BP goal:  <130/80 mmHg  Family History: The patient's family history includes Heart attack in his father; Pneumonia (age of onset: 77) in his mother.There is a family history of high blood pressure.  Social History: Married, former smoker (1.5 packs/day for 7 years) quit in 1972, never used smokeless tobacco, social alcohol user, no illicit drug use   Diet: Atkins diet. Meat/eggs. Vegetables/fruit. Trying to cut down on red meat. Loves seafood. Eats out with his wife most often. Adds salt to his food. Coffee drinker (3-4 in the morning), decaf in the evening. Last caffeinated drink of the day 10:30am.  Exercise: Loves walking- going to try to incorporate that more. Going to try to start going to the [employer] and other gym classes.   Wt Readings from Last 3 Encounters:  07/27/17 251 lb 1.9 oz (113.9 kg)  07/24/17 250 lb (113.4 kg)  05/19/17 255 lb (115.7 kg)   BP Readings from Last 3 Encounters:  08/13/17 126/64  07/27/17 136/76  07/25/17 (!) 160/57   Pulse Readings from Last 3 Encounters:  08/13/17 (!) 55  07/27/17 60  07/25/17 (!) 56  Renal function: CrCl cannot be calculated (Patient's most recent lab result is older than the maximum 21 days allowed.).  Past Medical History:  Diagnosis Date  . Aortic stenosis    MODERATE  . Carotid arterial disease ([place])   . Dysuria   . Gout   . Hematuria   . Hyperlipidemia   . Hypertension   . Obesity   . Sleep apnea     Current Outpatient Prescriptions on File Prior to Visit  Medication Sig Dispense Refill  . acetaminophen (TYLENOL) 325 MG tablet Take 650 mg by mouth every 6 (six) hours as needed.    Marland Kitchen allopurinol (ZYLOPRIM) 300 MG tablet Take 300 mg by mouth daily.      Marland Kitchen amLODipine (NORVASC) 10 MG tablet Take 1  tablet (10 mg total) by mouth daily. 90 tablet 3  . aspirin EC 81 MG tablet Take 1 tablet (81 mg total) by mouth daily.    . Cholecalciferol (VITAMIN D3) 1000 UNITS CAPS Take by mouth daily.      . clindamycin (CLEOCIN) 150 MG capsule Take 150 mg by mouth as needed. 4 Capsules 70mins prior to dental appt  (600mg )    . CRESTOR 20 MG tablet Take 0.5 tablets by mouth Daily. Pt takes generic - rosuvastin  1/2 daily    . famotidine (PEPCID) 20 MG tablet Take 20 mg by mouth daily.      . hydrochlorothiazide 25 MG tablet Take 25 mg by mouth daily.      . metoprolol (LOPRESSOR) 50 MG tablet Take 50 mg by mouth 2 (two) times daily.      . Multiple Vitamins-Minerals (CENTRUM SILVER) tablet Take 1 tablet by mouth daily.      . ramipril (ALTACE) 10 MG capsule Take 10 mg by mouth 2 (two) times daily.    . Sennosides (SENOKOT PO) Take by mouth as needed. FOR CONSTIPATION    . spironolactone (ALDACTONE) 25 MG tablet Take 1 tablet (25 mg total) by mouth daily. 30 tablet 11  . Tamsulosin HCl (FLOMAX) 0.4 MG CAPS Take 0.4 mg by mouth daily.       No current facility-administered medications on file prior to visit.     Allergies  Allergen Reactions  . Ibuprofen Hypertension     Assessment/Plan:  1. Hypertension - Wide variation and fluctuation in BP readings have improved since pt has moved amlodipine to evening dosing and pt has purchased a new BP cuff. Pre medication readings are slightly high in the AM and higher in the evening, however post medication readings are very well controlled. BP today in clinic and manual readings at home are excellent and at goal < 130/82mmHg. Will continue current medications. Discussed with pt that BP tends to fluctuate throughout the day. Advised him to call clinic if his evening BP readings 2 hrs after evening medications remain elevated >160-170 and can discuss prn clonidine if needed. F/u in HTN clinic as needed.   Andre Miranda, PharmD, CPP, [place] [address] [place], [place] [address] Phone: [phone]; Fax: [phone] 09/09/2017 2:12 PM

## 2017-10-01 ENCOUNTER — Ambulatory Visit: Payer: Self-pay | Admitting: Internal Medicine

## 2017-10-01 ENCOUNTER — Encounter: Payer: Self-pay | Admitting: Internal Medicine

## 2017-10-01 DIAGNOSIS — H31002 Unspecified chorioretinal scars, left eye: Secondary | ICD-10-CM | POA: Diagnosis not present

## 2017-10-01 DIAGNOSIS — H524 Presbyopia: Secondary | ICD-10-CM | POA: Diagnosis not present

## 2017-10-01 DIAGNOSIS — H2513 Age-related nuclear cataract, bilateral: Secondary | ICD-10-CM | POA: Diagnosis not present

## 2017-10-02 ENCOUNTER — Ambulatory Visit (INDEPENDENT_AMBULATORY_CARE_PROVIDER_SITE_OTHER): Payer: Medicare Other | Admitting: Internal Medicine

## 2017-10-02 ENCOUNTER — Encounter: Payer: Self-pay | Admitting: Internal Medicine

## 2017-10-02 VITALS — BP 128/76 | HR 58 | Ht 71.0 in | Wt 251.8 lb

## 2017-10-02 DIAGNOSIS — I6521 Occlusion and stenosis of right carotid artery: Secondary | ICD-10-CM

## 2017-10-02 DIAGNOSIS — G4733 Obstructive sleep apnea (adult) (pediatric): Secondary | ICD-10-CM

## 2017-10-02 DIAGNOSIS — I10 Essential (primary) hypertension: Secondary | ICD-10-CM

## 2017-10-02 NOTE — Progress Notes (Signed)
   Subjective:    Patient ID: Andre Miranda, male    DOB: 1936/11/11, 81 y.o.   MRN: [id number]  HPI  M former smoker followed for OSA complicated by CAD/CABG/PAD, aortic stenosis, lung nodules CPAP 10/ [facility]    Had flu vaccine PFT 06/29/2012-mild obstructive airways disease and small airways with response to bronchodilator. Normal lung volumes and diffusion. FEV13.10/106%, FEV1/FVC 0.79, FEF 25-75% 3.04/118% with 72% response to bronchodilator.  ------------------------------------------------------------------------------------  09/30/2016-81 year old male former smoker followed for OSA complicated by history CAD/CABG/PAD, Aortic stenosis/TAVR, lung nodules FOLLOWS FOR:DME: [facility] [employer] Pt wears CPAP every night for about 6 hours; some nights are about 4-5.Download confirms 100% 4 hour compliance with excellent control 0.9 AHI. He says quality of life is absolutely better with CPAP. He is going to call us to verify the name of the DME company he is changed to. Otherwise breathing is comfortable with no recent or acute cardiopulmonary issues.  10/02/17- 81 year old male former smoker followed for OSA complicated by history CAD/CABG/PAD, Aortic stenosis/TAVR, lung nodules CPAP 10/ [employer] OSA;DME [employer]. Pt wears CPAP nightly and DL attached. No new supplies needed at this time.  Download 100% compliance, AHI 0.8/hour."  He loves his CPAP sleeping much better with it.  ROS-see HPI + = positive Constitutional:   No-   weight loss, night sweats, fevers, chills, fatigue, lassitude. HEENT:   No-  headaches, difficulty swallowing, tooth/dental problems, sore throat,       No-  sneezing, itching, ear ache, nasal congestion, post nasal drip,  CV:  chest pain,  No-orthopnea, PND, swelling in lower extremities, anasarca,   dizziness, palpitations Resp: No-   shortness of breath with exertion or at rest.              No-   productive cough,  + non-productive cough,  No- coughing up  of blood.              No-   change in color of mucus.  No- wheezing.   Skin: No-   rash or lesions. GI:  No-   heartburn, indigestion, abdominal pain, nausea, vomiting,  GU:  MS:  No-   joint pain or swelling.  . Neuro-     nothing unusual Psych:  No- change in mood or affect. No depression or anxiety.  No memory loss.  Objective:   Physical Exam General- Alert, Oriented, Affect-appropriate, Distress- none acute  + Overweight Skin- rash-none, lesions- none, excoriation- none Lymphadenopathy- none Head- atraumatic Eyes- Gross vision intact, PERRLA, conjunctivae clear, secretions Ears- Hearing, canals, Tm - normal Nose- Clear, No-Septal dev, mucus, polyps, erosion, perforation Mild external deviation Throat- Mallampati II-III , mucosa clear , drainage- none, tonsils- atrophic Neck- flexible , trachea midline, no stridor , thyroid nl, carotid no bruit   Thick neck Chest - symmetrical excursion , unlabored  Heart/CV- RRR , murmur-none heard , no gallop  , no rub, nl s1 s2  - JVD- none , edema- none, stasis changes- none, varices- none  Lung- clear to P&A, wheeze- none, cough-None , dullness-none, rub- none   Chest wall- Abd-  Br/ Gen/ Rectal- Not done, not indicated Extrem- cyanosis- none, clubbing, none, atrophy- none, strength- nl Neuro- grossly intact to observation  Assessment & Plan:

## 2017-10-02 NOTE — Patient Instructions (Signed)
We can continue CPAP 10, mask of choice, humidifier, supplies, AirView    Dx OSA  Glad it is working well for you.  Please call if we can help

## 2017-10-04 NOTE — Assessment & Plan Note (Signed)
He understands the management of OSA helps management of HBP

## 2017-10-04 NOTE — Assessment & Plan Note (Signed)
Compliance is good and he is comfortable with pressure 10 so we will not make changes.

## 2017-11-26 DIAGNOSIS — Z1389 Encounter for screening for other disorder: Secondary | ICD-10-CM | POA: Diagnosis not present

## 2017-11-26 DIAGNOSIS — Z6836 Body mass index (BMI) 36.0-36.9, adult: Secondary | ICD-10-CM | POA: Diagnosis not present

## 2017-11-26 DIAGNOSIS — I10 Essential (primary) hypertension: Secondary | ICD-10-CM | POA: Diagnosis not present

## 2017-11-26 DIAGNOSIS — R7301 Impaired fasting glucose: Secondary | ICD-10-CM | POA: Diagnosis not present

## 2017-11-26 DIAGNOSIS — I2510 Atherosclerotic heart disease of native coronary artery without angina pectoris: Secondary | ICD-10-CM | POA: Diagnosis not present

## 2017-11-26 DIAGNOSIS — M545 Low back pain: Secondary | ICD-10-CM | POA: Diagnosis not present

## 2018-02-03 DIAGNOSIS — B356 Tinea cruris: Secondary | ICD-10-CM | POA: Diagnosis not present

## 2018-02-03 DIAGNOSIS — L570 Actinic keratosis: Secondary | ICD-10-CM | POA: Diagnosis not present

## 2018-02-03 DIAGNOSIS — D229 Melanocytic nevi, unspecified: Secondary | ICD-10-CM | POA: Diagnosis not present

## 2018-02-03 DIAGNOSIS — L821 Other seborrheic keratosis: Secondary | ICD-10-CM | POA: Diagnosis not present

## 2018-03-08 DIAGNOSIS — N35014 Post-traumatic urethral stricture, male, unspecified: Secondary | ICD-10-CM | POA: Diagnosis not present

## 2018-03-08 DIAGNOSIS — C61 Malignant neoplasm of prostate: Secondary | ICD-10-CM | POA: Diagnosis not present

## 2018-04-01 DIAGNOSIS — Z87891 Personal history of nicotine dependence: Secondary | ICD-10-CM | POA: Diagnosis not present

## 2018-04-01 DIAGNOSIS — I259 Chronic ischemic heart disease, unspecified: Secondary | ICD-10-CM | POA: Diagnosis not present

## 2018-04-01 DIAGNOSIS — I119 Hypertensive heart disease without heart failure: Secondary | ICD-10-CM | POA: Diagnosis not present

## 2018-04-01 DIAGNOSIS — M542 Cervicalgia: Secondary | ICD-10-CM | POA: Diagnosis not present

## 2018-04-01 DIAGNOSIS — M25511 Pain in right shoulder: Secondary | ICD-10-CM | POA: Diagnosis not present

## 2018-04-01 DIAGNOSIS — I2510 Atherosclerotic heart disease of native coronary artery without angina pectoris: Secondary | ICD-10-CM | POA: Diagnosis not present

## 2018-04-01 DIAGNOSIS — M25551 Pain in right hip: Secondary | ICD-10-CM | POA: Diagnosis not present

## 2018-04-01 DIAGNOSIS — Z951 Presence of aortocoronary bypass graft: Secondary | ICD-10-CM | POA: Diagnosis not present

## 2018-04-01 DIAGNOSIS — Z952 Presence of prosthetic heart valve: Secondary | ICD-10-CM | POA: Diagnosis not present

## 2018-04-01 DIAGNOSIS — M1990 Unspecified osteoarthritis, unspecified site: Secondary | ICD-10-CM | POA: Diagnosis not present

## 2018-04-01 DIAGNOSIS — Z48812 Encounter for surgical aftercare following surgery on the circulatory system: Secondary | ICD-10-CM | POA: Diagnosis not present

## 2018-04-01 DIAGNOSIS — M25512 Pain in left shoulder: Secondary | ICD-10-CM | POA: Diagnosis not present

## 2018-04-01 DIAGNOSIS — M25552 Pain in left hip: Secondary | ICD-10-CM | POA: Diagnosis not present

## 2018-05-18 ENCOUNTER — Other Ambulatory Visit: Payer: Self-pay

## 2018-05-18 ENCOUNTER — Encounter (HOSPITAL_COMMUNITY): Payer: Self-pay

## 2018-05-18 ENCOUNTER — Emergency Department (HOSPITAL_COMMUNITY)
Admission: EM | Admit: 2018-05-18 | Discharge: 2018-05-18 | Disposition: A | Discharge status: Home / Self Care | Payer: Medicare Other | Attending: Emergency Medicine | Admitting: Emergency Medicine

## 2018-05-18 DIAGNOSIS — R339 Retention of urine, unspecified: Secondary | ICD-10-CM | POA: Diagnosis not present

## 2018-05-18 DIAGNOSIS — Z7982 Long term (current) use of aspirin: Secondary | ICD-10-CM | POA: Insufficient documentation

## 2018-05-18 DIAGNOSIS — Z79899 Other long term (current) drug therapy: Secondary | ICD-10-CM | POA: Insufficient documentation

## 2018-05-18 DIAGNOSIS — I2510 Atherosclerotic heart disease of native coronary artery without angina pectoris: Secondary | ICD-10-CM | POA: Insufficient documentation

## 2018-05-18 DIAGNOSIS — Z8546 Personal history of malignant neoplasm of prostate: Secondary | ICD-10-CM | POA: Insufficient documentation

## 2018-05-18 DIAGNOSIS — I10 Essential (primary) hypertension: Secondary | ICD-10-CM | POA: Insufficient documentation

## 2018-05-18 DIAGNOSIS — Z951 Presence of aortocoronary bypass graft: Secondary | ICD-10-CM | POA: Diagnosis not present

## 2018-05-18 DIAGNOSIS — Z87891 Personal history of nicotine dependence: Secondary | ICD-10-CM | POA: Diagnosis not present

## 2018-05-18 DIAGNOSIS — R338 Other retention of urine: Secondary | ICD-10-CM | POA: Diagnosis not present

## 2018-05-18 DIAGNOSIS — Z9049 Acquired absence of other specified parts of digestive tract: Secondary | ICD-10-CM | POA: Insufficient documentation

## 2018-05-18 DIAGNOSIS — N99110 Postprocedural urethral stricture, male, meatal: Secondary | ICD-10-CM | POA: Diagnosis not present

## 2018-05-18 HISTORY — DX: Personal history of other specified conditions: Z87.898

## 2018-05-18 HISTORY — DX: Malignant (primary) neoplasm, unspecified: C80.1

## 2018-05-18 HISTORY — DX: Atherosclerotic heart disease of native coronary artery without angina pectoris: I25.10

## 2018-05-18 NOTE — ED Notes (Signed)
Per MD: no UA needed

## 2018-05-18 NOTE — ED Provider Notes (Signed)
[facility] DEPT Provider Note   CSN: [id number] Arrival date & time: 05/18/18  0759     History   Chief Complaint Chief Complaint  Patient presents with  . Urinary Retention    HPI Andre Miranda is a 82 y.o. male.  82 year old male complaining of decreased urination times several hours.  Has a history of prostate cancer and is status post prostatectomy also has a history of urethral stricture.  Has had this episodes before in the past which is usually responded to drinking beer which he tried today unsuccessfully.  Denies any suprapubic pressure.  States that he can now pee very small amounts but denies any dysuria.  No fever, vomiting, flank pain.     Past Medical History:  Diagnosis Date  . Aortic stenosis    MODERATE  . Carotid arterial disease ([place])   . Dysuria   . Gout   . Hematuria   . Hyperlipidemia   . Hypertension   . Obesity   . Sleep apnea     Patient Active Problem List   Diagnosis Date Noted  . 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 Accelerated hypertension 07/27/2017  . Right-sided chest wall pain 09/28/2014  . Occlusion and stenosis of carotid artery without mention of cerebral infarction 03/28/2014  . Acute bronchitis with asthma 08/25/2012  . Multiple lung nodules 06/20/2012  . Hyperlipidemia 10/02/2011  . CAD (coronary artery disease) 04/04/2011  . Hypertension   . Obesity   . Dysuria   . Hematuria   . Aortic stenosis   . Obstructive sleep apnea   . Gout   . Carotid arterial disease ([place])     Past Surgical History:  Procedure Laterality Date  . AORTIC VALVE REPAIR  February 06, 2015   Dr. Aline Brochure and Dr. Ysidro Evert  at [facility]  . [place]  02/2008  . CHOLECYSTECTOMY  04/2002  . CORONARY ARTERY BYPASS GRAFT  2003   LIMA TO THE LAD, RIMA TO THE RCA, AND A SAPHENOUS VEIN GRAFT TO THE INTERMEDIATE AND DISTAL LEFT CIRCUMFLEX  . US ECHOCARDIOGRAPHY  08/2009   SHOWED A  MEAN GRADIENT ACROSS IS AORTIC VALVE OF 24 MM OF MERCURY. HE HAD MODERATE LVH AND NORMAL LV FUNCTION        Home Medications    Prior to Admission medications   Medication Sig Start Date End Date Taking? Authorizing Provider  acetaminophen (TYLENOL) 325 MG tablet Take 650 mg by mouth every 6 (six) hours as needed.    [provider]  allopurinol (ZYLOPRIM) 300 MG tablet Take 300 mg by mouth daily.      [provider]  amLODipine (NORVASC) 10 MG tablet Take 1 tablet (10 mg total) by mouth daily. 09/02/17   Belva Crome, MD  aspirin EC 81 MG tablet Take 1 tablet (81 mg total) by mouth daily. 11/23/12   Larey Dresser, MD  Cholecalciferol (VITAMIN D3) 1000 UNITS CAPS Take by mouth daily.      [provider]  clindamycin (CLEOCIN) 150 MG capsule Take 150 mg by mouth as needed. 4 Capsules 89mins prior to dental appt  (600mg )    [provider]  CRESTOR 20 MG tablet Take 0.5 tablets by mouth Daily. Pt takes generic - rosuvastin  1/2 daily 08/27/11   [provider]  famotidine (PEPCID) 20 MG tablet Take 20 mg by mouth daily.      [provider]  hydrochlorothiazide 25 MG tablet Take  25 mg by mouth daily.      [provider]  metoprolol (LOPRESSOR) 50 MG tablet Take 50 mg by mouth 2 (two) times daily.      [provider]  Multiple Vitamins-Minerals (CENTRUM SILVER) tablet Take 1 tablet by mouth daily.      [provider]  ramipril (ALTACE) 10 MG capsule Take 10 mg by mouth 2 (two) times daily. 05/31/17   [provider]  Sennosides (SENOKOT PO) Take by mouth as needed. FOR CONSTIPATION    [provider]  spironolactone (ALDACTONE) 25 MG tablet Take 1 tablet (25 mg total) by mouth daily. 08/20/17   Belva Crome, MD  Tamsulosin HCl (FLOMAX) 0.4 MG CAPS Take 0.4 mg by mouth daily.      [provider]    Family History Family History  Problem Relation Age of Onset  . Heart attack  Father   . Pneumonia Mother 39    Social History Social History   Tobacco Use  . Smoking status: Former Smoker    Packs/day: 1.50    Years: 7.00    Pack years: 10.50    Types: Cigarettes    Last attempt to quit: 04/03/1971    Years since quitting: 47.1  . Smokeless tobacco: Never Used  Substance Use Topics  . Alcohol use: Yes  . Drug use: No     Allergies   Ibuprofen   Review of Systems Review of Systems  All other systems reviewed and are negative.    Physical Exam Updated Vital Signs BP 137/65 (BP Location: Left Arm)   Pulse 65   Temp 98.1 F (36.7 C) (Oral)   Resp 18   Ht 1.778 m (5\' 10" )   Wt 113.4 kg (250 lb)   SpO2 99%   BMI 35.87 kg/m   Physical Exam  Constitutional: He is oriented to person, place, and time. He appears well-developed and well-nourished.  Non-toxic appearance. No distress.  HENT:  Head: Normocephalic and atraumatic.  Eyes: Pupils are equal, round, and reactive to light. Conjunctivae, EOM and lids are normal.  Neck: Normal range of motion. Neck supple. No tracheal deviation present. No thyroid mass present.  Cardiovascular: Normal rate, regular rhythm and normal heart sounds. Exam reveals no gallop.  No murmur heard. Pulmonary/Chest: Effort normal and breath sounds normal. No stridor. No respiratory distress. He has no decreased breath sounds. He has no wheezes. He has no rhonchi. He has no rales.  Abdominal: Soft. Normal appearance and bowel sounds are normal. He exhibits no distension. There is no tenderness. There is no rigidity, no rebound, no guarding and no CVA tenderness.  Musculoskeletal: Normal range of motion. He exhibits no edema or tenderness.  Neurological: He is alert and oriented to person, place, and time. He has normal strength. No cranial nerve deficit or sensory deficit. GCS eye subscore is 4. GCS verbal subscore is 5. GCS motor subscore is 6.  Skin: Skin is warm and dry. No abrasion and no rash noted.  Psychiatric: He  has a normal mood and affect. His speech is normal and behavior is normal.  Nursing note and vitals reviewed.    ED Treatments / Results  Labs (all labs ordered are listed, but only abnormal results are displayed) Labs Reviewed - No data to display  EKG None  Radiology No results found.  Procedures Procedures (including critical care time)  Medications Ordered in ED Medications - No data to display   Initial Impression / Assessment and  Plan / ED Course  I have reviewed the triage vital signs and the nursing notes.  Pertinent labs & imaging results that were available during my care of the patient were reviewed by me and considered in my medical decision making (see chart for details).    Patient with evidence of urinary retention on bladder scan with over 700 cc of urine in his bladder noted. Patient seen by Dr. Tammi Klippel from urology who has placed a Foley and has arranged follow-up  Final Clinical Impressions(s) / ED Diagnoses   Final diagnoses:  None    ED Discharge Orders    None       Lacretia Leigh, MD 05/18/18 1014

## 2018-05-18 NOTE — Consult Note (Signed)
Reason for Consult: Prostate Cancer, Bulbar Urethral Stricture  Referring Physician: Lacretia Leigh MD  FREDDERICK SWANGER is an 82 y.o. male.   HPI:  1 - Prostate Cancer -  S/p external beam radiation around 2011 for Gleason 7 disease. Has received most f/u by Dr. Rosana Hoes at [facility] and PSA remains near undetectable per report.   2 - Bulbar Urethral Stricture / Recurrent Urinary Retention - long h/o recurrent bulbar stricture which predates his radiation s/p dilation x many, usually about ever 2-3 years per report. Underwent emergent ER balloon dilation 05/2018 to 37F in setting of recurrent retention.  PMH sig for CAD/CABG/AVR (tissue, no anticoagulation), Obesity.   Today "[facility]" is seen as emergent consult for urinary retention. Just dribbling small urine with PVR >1L by bladder scan. No fevers.   Past Medical History:  Diagnosis Date  . Aortic stenosis    MODERATE  . Cancer ([place])   . Carotid arterial disease ([place])   . Coronary artery disease   . Dysuria   . Gout   . Hematuria   . History of urinary retention   . Hyperlipidemia   . Hypertension   . Obesity   . Sleep apnea     Past Surgical History:  Procedure Laterality Date  . AORTIC VALVE REPAIR  February 06, 2015   Dr. Aline Brochure and Dr. Ysidro Evert  at [facility]  . [place]  02/2008  . CHOLECYSTECTOMY  04/2002  . CORONARY ARTERY BYPASS GRAFT  2003   LIMA TO THE LAD, RIMA TO THE RCA, AND A SAPHENOUS VEIN GRAFT TO THE INTERMEDIATE AND DISTAL LEFT CIRCUMFLEX  . US ECHOCARDIOGRAPHY  08/2009   SHOWED A MEAN GRADIENT ACROSS IS AORTIC VALVE OF 24 MM OF MERCURY. HE HAD MODERATE LVH AND NORMAL LV FUNCTION    Family History  Problem Relation Age of Onset  . Heart attack Father   . Pneumonia Mother 11    Social History:  reports that he quit smoking about 47 years ago. His smoking use included cigarettes. He has a 10.50 pack-year smoking history. He has never used smokeless tobacco. He reports that he drinks alcohol. He  reports that he does not use drugs.  Allergies:  Allergies  Allergen Reactions  . Ibuprofen Hypertension    Medications: I have reviewed the patient's current medications.  No results found for this or any previous visit (from the past 48 hour(s)).  No results found.  Review of Systems  Constitutional: Negative.  Negative for chills and fever.  HENT: Negative.   Eyes: Negative.   Respiratory: Negative.   Cardiovascular: Negative.   Gastrointestinal: Negative.   Genitourinary: Positive for frequency and urgency.  Musculoskeletal: Negative.   Skin: Negative.   Neurological: Negative.   Endo/Heme/Allergies: Negative.   Psychiatric/Behavioral: Negative.    Blood pressure 135/60, pulse 65, temperature 98.1 F (36.7 C), temperature source Oral, resp. rate 18, height 5\' 10"  (1.778 m), weight 113.4 kg (250 lb), SpO2 98 %. Physical Exam  Constitutional: He appears well-developed.  IN ER. Wife at bedside.   HENT:  Head: Normocephalic.  Eyes: Pupils are equal, round, and reactive to light.  Neck: Normal range of motion.  Cardiovascular: Normal rate.  Respiratory: Effort normal.  GI:  Significant truncal obesity.   Genitourinary:  Genitourinary Comments: No CVAT. Some SP TTP.   Musculoskeletal: Normal range of motion.  Neurological: He is alert.  Skin: Skin is warm.  Psychiatric: He has a normal mood and affect.   BEDSIDE CYSTOSCOPY /  DILATION:  Using aseptic technique and 42mL viscious lidocaine analgesia cystoscopy perforemd with 73F flexible scope. approx 38F bulbar stricture noted. Sensor wire advanced across over which 68F balloon dilator placed, inflated to 20ATM x 90 seconds. Cysto revealed approx 36mm segment stircture, open prostatic fossa, moderate bladder trabeculation w/o papillary lesions. 20R council catheter then placed over wite with 10cc H2O in balloon and connected to gravity drainage, immeciat efflux 920mL yellow urine.   Assessment/Plan:  1 - Prostate  Cancer -  Good biochemical control. Rec continued annual PSA until 2021 (10 years cancer free).short  2 - Bulbar Urethral Stricture / Recurrent Urinary Retention - Dilated today as per above. Rec office trial of void in about 1 week. He understads this will likely require PRN dilation and is very UNlikley to be cured given XRT history and urethroplasty results would also not be good in prior radiated field.  PT OK for DC home.   Bryor Rami 05/18/2018, 4:56 PM

## 2018-05-18 NOTE — ED Triage Notes (Signed)
Pt presents with urinary retention. Last void at 4 am. "frequency and small stream". HX of urinary stricture and prostate cancer. EDP ALLEN PRESENT

## 2018-05-18 NOTE — ED Notes (Signed)
Leg bag placed on pt

## 2018-05-25 DIAGNOSIS — Z8546 Personal history of malignant neoplasm of prostate: Secondary | ICD-10-CM | POA: Diagnosis not present

## 2018-05-25 DIAGNOSIS — R338 Other retention of urine: Secondary | ICD-10-CM | POA: Diagnosis not present

## 2018-05-25 DIAGNOSIS — N35011 Post-traumatic bulbous urethral stricture: Secondary | ICD-10-CM | POA: Diagnosis not present

## 2018-06-08 DIAGNOSIS — Z86010 Personal history of colonic polyps: Secondary | ICD-10-CM | POA: Diagnosis not present

## 2018-07-06 ENCOUNTER — Other Ambulatory Visit: Payer: Self-pay | Admitting: Interventional Cardiology

## 2018-07-06 DIAGNOSIS — I701 Atherosclerosis of renal artery: Secondary | ICD-10-CM

## 2018-07-06 DIAGNOSIS — C61 Malignant neoplasm of prostate: Secondary | ICD-10-CM | POA: Diagnosis not present

## 2018-07-06 DIAGNOSIS — N35011 Post-traumatic bulbous urethral stricture: Secondary | ICD-10-CM | POA: Diagnosis not present

## 2018-07-18 ENCOUNTER — Other Ambulatory Visit: Payer: Self-pay | Admitting: Interventional Cardiology

## 2018-07-22 DIAGNOSIS — J40 Bronchitis, not specified as acute or chronic: Secondary | ICD-10-CM | POA: Diagnosis not present

## 2018-07-22 DIAGNOSIS — Z6837 Body mass index (BMI) 37.0-37.9, adult: Secondary | ICD-10-CM | POA: Diagnosis not present

## 2018-07-22 DIAGNOSIS — R05 Cough: Secondary | ICD-10-CM | POA: Diagnosis not present

## 2018-08-05 DIAGNOSIS — D1801 Hemangioma of skin and subcutaneous tissue: Secondary | ICD-10-CM | POA: Diagnosis not present

## 2018-08-05 DIAGNOSIS — L304 Erythema intertrigo: Secondary | ICD-10-CM | POA: Diagnosis not present

## 2018-08-05 DIAGNOSIS — L814 Other melanin hyperpigmentation: Secondary | ICD-10-CM | POA: Diagnosis not present

## 2018-08-05 DIAGNOSIS — D229 Melanocytic nevi, unspecified: Secondary | ICD-10-CM | POA: Diagnosis not present

## 2018-08-05 DIAGNOSIS — L578 Other skin changes due to chronic exposure to nonionizing radiation: Secondary | ICD-10-CM | POA: Diagnosis not present

## 2018-08-05 DIAGNOSIS — L821 Other seborrheic keratosis: Secondary | ICD-10-CM | POA: Diagnosis not present

## 2018-08-23 ENCOUNTER — Other Ambulatory Visit: Payer: Self-pay | Admitting: Interventional Cardiology

## 2018-09-02 DIAGNOSIS — Z23 Encounter for immunization: Secondary | ICD-10-CM | POA: Diagnosis not present

## 2018-09-17 DIAGNOSIS — N35014 Post-traumatic urethral stricture, male, unspecified: Secondary | ICD-10-CM | POA: Diagnosis not present

## 2018-09-17 DIAGNOSIS — C61 Malignant neoplasm of prostate: Secondary | ICD-10-CM | POA: Diagnosis not present

## 2018-09-17 DIAGNOSIS — Z8546 Personal history of malignant neoplasm of prostate: Secondary | ICD-10-CM | POA: Diagnosis not present

## 2018-09-17 DIAGNOSIS — N3020 Other chronic cystitis without hematuria: Secondary | ICD-10-CM | POA: Diagnosis not present

## 2018-09-20 ENCOUNTER — Other Ambulatory Visit: Payer: Self-pay | Admitting: Interventional Cardiology

## 2018-09-29 DIAGNOSIS — Z125 Encounter for screening for malignant neoplasm of prostate: Secondary | ICD-10-CM | POA: Diagnosis not present

## 2018-09-29 DIAGNOSIS — E7849 Other hyperlipidemia: Secondary | ICD-10-CM | POA: Diagnosis not present

## 2018-09-29 DIAGNOSIS — R82998 Other abnormal findings in urine: Secondary | ICD-10-CM | POA: Diagnosis not present

## 2018-09-29 DIAGNOSIS — M109 Gout, unspecified: Secondary | ICD-10-CM | POA: Diagnosis not present

## 2018-09-29 DIAGNOSIS — Z0000 Encounter for general adult medical examination without abnormal findings: Secondary | ICD-10-CM | POA: Diagnosis not present

## 2018-09-29 DIAGNOSIS — R7301 Impaired fasting glucose: Secondary | ICD-10-CM | POA: Diagnosis not present

## 2018-09-29 DIAGNOSIS — E785 Hyperlipidemia, unspecified: Secondary | ICD-10-CM | POA: Diagnosis not present

## 2018-09-30 ENCOUNTER — Other Ambulatory Visit: Payer: Self-pay | Admitting: *Deleted

## 2018-09-30 ENCOUNTER — Telehealth: Payer: Self-pay | Admitting: Interventional Cardiology

## 2018-09-30 MED ORDER — AMLODIPINE BESYLATE 10 MG PO TABS: 10.0000 mg | ORAL_TABLET | Freq: Every day | ORAL | 0 refills | Status: DC

## 2018-09-30 NOTE — Telephone Encounter (Signed)
New Message          *STAT* If patient is at the pharmacy, call can be transferred to refill team.   1. Which medications need to be refilled? (please list name of each medication and dose if known) amLODipine (NORVASC) 10 MG tablet  2. Which pharmacy/location (including street and city if local pharmacy) is medication to be sent to? [place], [place] - [address] [phone] (Phone) [phone] (Fax)     3. Do they need a 30 day or 90 day supply? [place]

## 2018-10-05 ENCOUNTER — Encounter: Payer: Self-pay | Admitting: Internal Medicine

## 2018-10-05 ENCOUNTER — Ambulatory Visit (INDEPENDENT_AMBULATORY_CARE_PROVIDER_SITE_OTHER): Payer: Medicare Other | Admitting: Internal Medicine

## 2018-10-05 ENCOUNTER — Telehealth: Payer: Self-pay | Admitting: Internal Medicine

## 2018-10-05 VITALS — BP 108/52 | HR 58 | Ht 70.0 in | Wt 260.6 lb

## 2018-10-05 DIAGNOSIS — I701 Atherosclerosis of renal artery: Secondary | ICD-10-CM | POA: Diagnosis not present

## 2018-10-05 DIAGNOSIS — J45909 Unspecified asthma, uncomplicated: Secondary | ICD-10-CM

## 2018-10-05 DIAGNOSIS — J209 Acute bronchitis, unspecified: Secondary | ICD-10-CM | POA: Diagnosis not present

## 2018-10-05 DIAGNOSIS — G4733 Obstructive sleep apnea (adult) (pediatric): Secondary | ICD-10-CM | POA: Diagnosis not present

## 2018-10-05 MED ORDER — AZITHROMYCIN 250 MG PO TABS: ORAL_TABLET | ORAL | 0 refills | Status: DC

## 2018-10-05 NOTE — Assessment & Plan Note (Signed)
He continues to benefit from CPAP with improved sleep.  Download confirms excellent compliance and control.  Machine is old. Plan-replace old machine, changing to AutoPap 5-15

## 2018-10-05 NOTE — Patient Instructions (Signed)
Order- DME [facility]- please replace old CPAP machine, changing to auto 5-15, mask of choice, humidifier, supplies, AirView  Script sent for Zpak antibiotic to help you clear the lingering bronchitis  Please call if we can help

## 2018-10-05 NOTE — Assessment & Plan Note (Signed)
Lingering cough after an acute infection this summer.  I suggested we give him a Z-Pak.  If cough persists he should have CXR because of his smoking history.

## 2018-10-05 NOTE — Progress Notes (Signed)
   Subjective:    Patient ID: Andre Miranda, male    DOB: 10/07/1936, 82 y.o.   MRN: [id number]  HPI  M former smoker followed for OSA complicated by CAD/CABG/PAD, aortic stenosis, lung nodules CPAP 10/ [facility]    Had flu vaccine PFT 06/29/2012-mild obstructive airways disease and small airways with response to bronchodilator. Normal lung volumes and diffusion. FEV13.10/106%, FEV1/FVC 0.79, FEF 25-75% 3.04/118% with 72% response to bronchodilator.  ------------------------------------------------------------------------------------ 10/02/17- 83 year old male former smoker followed for OSA complicated by history CAD/CABG/PAD, Aortic stenosis/TAVR, lung nodules CPAP 10/ [facility] OSA;DME [facility]. Pt wears CPAP nightly and DL attached. No new supplies needed at this time.  Download 100% compliance, AHI 0.8/hour."  He loves his CPAP sleeping much better with it.  10/05/2018- 82 year old male former smoker followed for OSA complicated by history CAD/CABG/PAD, Aortic stenosis/TAVR, lung nodules/ granulomas, prostate cancer CPAP 10/ [facility]>> today replace old machine change to auto 5-15 -----mild cough and chest congestion for couple months but beginning to clear.  Otherwise has been doing well since last visit. Body weight today 260 pounds Download compliance 97.8%, AHI 1.2/hour.  He is comfortable with CPAP and sleeps better with it.  This machine is now well over 68 years old and we discussed replacement with conversion to AutoPap. Lingering cough since he and his wife got chest infections on a trip to [place] this summer.  Never frankly purulent.  ROS-see HPI + = positive Constitutional:   No-   weight loss, night sweats, fevers, chills, fatigue, lassitude. HEENT:   No-  headaches, difficulty swallowing, tooth/dental problems, sore throat,       No-  sneezing, itching, ear ache, nasal congestion, post nasal drip,  CV:  chest pain,  No-orthopnea, PND, swelling in lower extremities, anasarca,    dizziness, palpitations Resp: No-   shortness of breath with exertion or at rest.             +  productive cough,  + non-productive cough,  No- coughing up of blood.              No-   change in color of mucus.  No- wheezing.   Skin: No-   rash or lesions. GI:  No-   heartburn, indigestion, abdominal pain, nausea, vomiting,  GU:  MS:  No-   joint pain or swelling.  . Neuro-     nothing unusual Psych:  No- change in mood or affect. No depression or anxiety.  No memory loss.  Objective:   Physical Exam General- Alert, Oriented, Affect-appropriate, Distress- none acute  + Overweight Skin- rash-none, lesions- none, excoriation- none Lymphadenopathy- none Head- atraumatic Eyes- Gross vision intact, PERRLA, conjunctivae clear, secretions Ears- +Hearing aids Nose- Clear, No-Septal dev, mucus, polyps, erosion, perforation Mild external deviation Throat- Mallampati II-III , mucosa clear , drainage- none, tonsils- atrophic Neck- flexible , trachea midline, no stridor , thyroid nl, carotid no bruit   Thick neck Chest - symmetrical excursion , unlabored  Heart/CV- RRR , murmur-none heard , no gallop  , no rub, nl s1 s2  - JVD- none , edema- none, stasis changes- none, varices- none  Lung- clear to P&A, wheeze- none, cough+ deep/bronchitic, dullness-none, rub- none   Chest wall- Abd-  Br/ Gen/ Rectal- Not done, not indicated Extrem- cyanosis- none, clubbing, none, atrophy- none, strength- nl Neuro- grossly intact to observation  Assessment & Plan:

## 2018-10-05 NOTE — Telephone Encounter (Signed)
Attempted to call Pam with [facility] but unable to reach. Left message for Pam to return call.  Have located in pt's media tab that there is a scanned sleep study dated 10/01/17. Will clarify with Pam if this is the one they are needing.

## 2018-10-06 DIAGNOSIS — Z0000 Encounter for general adult medical examination without abnormal findings: Secondary | ICD-10-CM | POA: Diagnosis not present

## 2018-10-06 DIAGNOSIS — R7301 Impaired fasting glucose: Secondary | ICD-10-CM | POA: Diagnosis not present

## 2018-10-06 DIAGNOSIS — H6121 Impacted cerumen, right ear: Secondary | ICD-10-CM | POA: Diagnosis not present

## 2018-10-06 DIAGNOSIS — R300 Dysuria: Secondary | ICD-10-CM | POA: Diagnosis not present

## 2018-10-06 DIAGNOSIS — J40 Bronchitis, not specified as acute or chronic: Secondary | ICD-10-CM | POA: Diagnosis not present

## 2018-10-06 DIAGNOSIS — I2510 Atherosclerotic heart disease of native coronary artery without angina pectoris: Secondary | ICD-10-CM | POA: Diagnosis not present

## 2018-10-06 DIAGNOSIS — C61 Malignant neoplasm of prostate: Secondary | ICD-10-CM | POA: Diagnosis not present

## 2018-10-06 DIAGNOSIS — Z6837 Body mass index (BMI) 37.0-37.9, adult: Secondary | ICD-10-CM | POA: Diagnosis not present

## 2018-10-06 DIAGNOSIS — R911 Solitary pulmonary nodule: Secondary | ICD-10-CM | POA: Diagnosis not present

## 2018-10-06 DIAGNOSIS — R05 Cough: Secondary | ICD-10-CM | POA: Diagnosis not present

## 2018-10-06 DIAGNOSIS — I350 Nonrheumatic aortic (valve) stenosis: Secondary | ICD-10-CM | POA: Diagnosis not present

## 2018-10-06 NOTE — Telephone Encounter (Signed)
Pam returned call, CB is [phone].

## 2018-10-06 NOTE — Telephone Encounter (Signed)
Called Pam, unable to reach. Left message to give [place] a call back.

## 2018-10-07 NOTE — Telephone Encounter (Signed)
Faxed 1 page study from 05/08/11.

## 2018-10-20 DIAGNOSIS — Z87828 Personal history of other (healed) physical injury and trauma: Secondary | ICD-10-CM | POA: Diagnosis not present

## 2018-10-20 DIAGNOSIS — H2513 Age-related nuclear cataract, bilateral: Secondary | ICD-10-CM | POA: Diagnosis not present

## 2018-10-24 ENCOUNTER — Other Ambulatory Visit: Payer: Self-pay | Admitting: Interventional Cardiology

## 2018-11-23 ENCOUNTER — Ambulatory Visit (HOSPITAL_COMMUNITY)
Admission: RE | Admit: 2018-11-23 | Discharge: 2018-11-23 | Disposition: A | Discharge status: Home / Self Care | Payer: Medicare Other | Source: Ambulatory Visit | Attending: Vascular Surgery | Admitting: Vascular Surgery

## 2018-11-23 ENCOUNTER — Encounter: Payer: Self-pay | Admitting: Family

## 2018-11-23 ENCOUNTER — Ambulatory Visit (INDEPENDENT_AMBULATORY_CARE_PROVIDER_SITE_OTHER): Payer: Medicare Other | Admitting: Family

## 2018-11-23 ENCOUNTER — Other Ambulatory Visit: Payer: Self-pay

## 2018-11-23 VITALS — BP 115/59 | HR 61 | Temp 97.2°F | Resp 16 | Ht 70.0 in | Wt 250.0 lb

## 2018-11-23 DIAGNOSIS — I6521 Occlusion and stenosis of right carotid artery: Secondary | ICD-10-CM

## 2018-11-23 DIAGNOSIS — Z9889 Other specified postprocedural states: Secondary | ICD-10-CM

## 2018-11-23 NOTE — Progress Notes (Signed)
Chief Complaint: Follow up Extracranial Carotid Artery Stenosis   History of Present Illness  Andre Miranda is a 83 y.o. male who is s/p right carotid endarterectomy by Dr. Donnetta Hutching in 2009 for severe asymptomatic carotid disease.  He denies any known history of stroke or TIA. Specifically he denies a history of amaurosis fugax or monocular blindness, unilateral facial drooping, hemiplegia, or receptive or expressive aphasia.    He denies tingling, numbness, pain, or cold feeling in either upper extremity.  He denies claudication type symptoms in his legs with walking.   He had an aortic valve replaced March 2016 at [facility] for asymptomatic aortic valve stenosis. Dr. Aundra Dubin is his cardiologist.  At today's visit he denies chest pain or dyspnea.   Diabetic: no Tobacco use: former smoker, quit in 1972  Pt meds include: Statin : yes ASA: yes Other anticoagulants/antiplatelets: Plavix for 3 months post aortic valve relplacement     Past Medical History:  Diagnosis Date  . Aortic stenosis    MODERATE  . Cancer ([place])   . Carotid arterial disease ([place])   . Coronary artery disease   . Dysuria   . Gout   . Hematuria   . History of urinary retention   . Hyperlipidemia   . Hypertension   . Obesity   . Sleep apnea     Social History Social History   Tobacco Use  . Smoking status: Former Smoker    Packs/day: 1.50    Years: 7.00    Pack years: 10.50    Types: Cigarettes    Last attempt to quit: 04/03/1971    Years since quitting: 47.6  . Smokeless tobacco: Never Used  Substance Use Topics  . Alcohol use: Yes  . Drug use: No    Family History Family History  Problem Relation Age of Onset  . Heart attack Father   . Pneumonia Mother 72    Surgical History Past Surgical History:  Procedure Laterality Date  . AORTIC VALVE REPAIR  February 06, 2015   Dr. Aline Brochure and Dr. Ysidro Evert  at [facility]  . [place]  02/2008  . CHOLECYSTECTOMY  04/2002  .  CORONARY ARTERY BYPASS GRAFT  2003   LIMA TO THE LAD, RIMA TO THE RCA, AND A SAPHENOUS VEIN GRAFT TO THE INTERMEDIATE AND DISTAL LEFT CIRCUMFLEX  . US ECHOCARDIOGRAPHY  08/2009   SHOWED A MEAN GRADIENT ACROSS IS AORTIC VALVE OF 24 MM OF MERCURY. HE HAD MODERATE LVH AND NORMAL LV FUNCTION    Allergies  Allergen Reactions  . Ibuprofen Hypertension    Current Outpatient Medications  Medication Sig Dispense Refill  . acetaminophen (TYLENOL) 325 MG tablet Take 650 mg by mouth every 6 (six) hours as needed.    Marland Kitchen allopurinol (ZYLOPRIM) 300 MG tablet Take 300 mg by mouth daily.      Marland Kitchen amLODipine (NORVASC) 10 MG tablet Take 1 tablet (10 mg total) by mouth daily. Patient must keep 12/20/18 appointment for further refills 90 tablet 0  . aspirin EC 81 MG tablet Take 1 tablet (81 mg total) by mouth daily.    . Cholecalciferol (VITAMIN D3) 1000 UNITS CAPS Take by mouth daily.      . CRESTOR 20 MG tablet Take 0.5 tablets by mouth Daily. Pt takes generic - rosuvastin  1/2 daily    . famotidine (PEPCID) 20 MG tablet Take 20 mg by mouth daily.      . hydrochlorothiazide 25 MG tablet Take 25 mg by mouth  daily.      . ketoconazole (NIZORAL) 2 % cream Apply 1 application topically daily as needed for irritation.    . metoprolol (LOPRESSOR) 50 MG tablet Take 50 mg by mouth 2 (two) times daily.      . Multiple Vitamins-Minerals (CENTRUM SILVER) tablet Take 1 tablet by mouth daily.      . ramipril (ALTACE) 10 MG capsule Take 10 mg by mouth 2 (two) times daily.    . Sennosides (SENOKOT PO) Take by mouth as needed. FOR CONSTIPATION    . spironolactone (ALDACTONE) 25 MG tablet Take 1 tablet (25 mg total) by mouth daily. Please keep upcoming appt in February for future refills. Thank you 90 tablet 0  . Tamsulosin HCl (FLOMAX) 0.4 MG CAPS Take 0.4 mg by mouth daily.      Marland Kitchen azithromycin (ZITHROMAX) 250 MG tablet 2 today then one daily (Patient not taking: Reported on 11/23/2018) 6 tablet 0  . clindamycin (CLEOCIN) 150 MG  capsule Take 150 mg by mouth as needed. 4 Capsules 26mins prior to dental appt  (600mg )     No current facility-administered medications for this visit.     Review of Systems : See HPI for pertinent positives and negatives.  Physical Examination  Vitals:   11/23/18 1057 11/23/18 1059  BP: (!) 120/59 (!) 115/59  Pulse: 61 61  Resp: 16   Temp: (!) 97.2 F (36.2 C)   TempSrc: Oral   SpO2: 98%   Weight: 250 lb (113.4 kg)   Height: 5\' 10"  (1.778 m)    Body mass index is 35.87 kg/m.  General: WDWN obese male in NAD GAIT: normal Eyes: PERRLA HENT: No gross abnormalities.  Pulmonary:  Respirations are non-labored, good air movement in all fields, CTAB, no rales, rhonchi, or wheezes Cardiac: regular rhythm, no detected murmur.  VASCULAR EXAM Carotid Bruits Right Left   Negative Negative     Abdominal aortic pulse is not palpable. Radial pulses are 2+ palpable and equal.                                                                                                                            LE Pulses Right Left       POPLITEAL  not palpable   not palpable       POSTERIOR TIBIAL  1+ palpable   1+ palpable        DORSALIS PEDIS      ANTERIOR TIBIAL 2+ palpable  2+ palpable     Gastrointestinal: soft, nontender, BS WNL, no r/g, no palpable masses. Musculoskeletal: no muscle atrophy/wasting. M/S 5/5 throughout, extremities without ischemic changes. Skin: No rashes, no ulcers, no cellulitis.   Neurologic:  A&O X 3; appropriate affect, sensation is normal; speech is normal, CN 2-12 intact, pain and light touch intact in extremities, motor exam as listed above. Psychiatric: Normal thought content, mood appropriate to clinical situation.    Assessment: Andre Miranda is a 83 y.o.  male who is s/p right carotid endarterectomy on 03/14/2008. He has no history of stroke or TIA.   His atherosclerotic risk factors include former smoker (quit in 1972), obesity, and  dyslipidemia (he is on a statin).  Fortunately he does not have DM.  DATA  Carotid Duplex (11-23-18): Right IC: CEA site with 1-39% stenosis Left ICA: 1-39% stenosis Bilateral vertebral artery flow is antegrade.  Bilateral subclavian artery waveforms are normal.  No change compared to the exams on 03-28-14, 04-10-15, 04-15-16, and 05-19-17.     Plan: Follow-up in 18 months with Carotid Duplex scan.    I discussed in depth with the patient the nature of atherosclerosis, and emphasized the importance of maximal medical management including strict control of blood pressure, blood glucose, and lipid levels, obtaining regular exercise, and continued cessation of smoking.  The patient is aware that without maximal medical management the underlying atherosclerotic disease process will progress, limiting the benefit of any interventions. The patient was given information about stroke prevention and what symptoms should prompt the patient to seek immediate medical care. Thank you for allowing [place] to participate in this patient's care.  Clemon Chambers, RN, MSN, FNP-C [facility] Office: [phone]  Clinic Physician: Bishop Dublin  11/23/18 11:09 AM

## 2018-11-23 NOTE — Patient Instructions (Signed)

## 2018-12-02 ENCOUNTER — Encounter: Payer: Self-pay | Admitting: Interventional Cardiology

## 2018-12-18 NOTE — Progress Notes (Signed)
Cardiology Office Note:    Date:  12/20/2018   ID:  Andre Miranda, DOB [birth year], MRN [id number]  PCP:  Crist Infante, MD  Cardiologist:  Sinclair Grooms, MD   Referring MD: Crist Infante, MD   Chief Complaint  Patient presents with  . Cardiac Valve Problem    History of Present Illness:    Andre Miranda is a 83 y.o. male with a hx of CAD s/p CABG 2003 and aortic stenosis s/p TAVR 2016 [place], hyperlipidemia, and most recently difficulty with severe blood pressure elevation.  He denies chest pain, syncope, and lower extremity swelling.  He has experienced palpitations.  He can hear and count his heartbeat.  This is been going on for several years but is more apparent recently.  He has been having orthostatic dizziness.  When he stands quickly he feels lightheaded.  He provides blood pressures from home that have run in the 100 to 382 mmHg systolic range.  Heart rates are in the 60s.  He denies neurological symptoms, orthopnea, PND and limitations due to dyspnea.  No specific medication side effects.  He does hear his heartbeat.  He does complain of every third beat being an early beat or irregular beat.  He has not had lightheadedness, dizziness, or prolonged tachycardia.   Past Medical History:  Diagnosis Date  . Aortic stenosis    MODERATE  . Cancer ([place])   . Carotid arterial disease ([place])   . Coronary artery disease   . Dysuria   . Gout   . Hematuria   . History of urinary retention   . Hyperlipidemia   . Hypertension   . Obesity   . Sleep apnea     Past Surgical History:  Procedure Laterality Date  . AORTIC VALVE REPAIR  February 06, 2015   Dr. Aline Brochure and Dr. Ysidro Evert  at [facility]  . [place]  02/2008  . CHOLECYSTECTOMY  04/2002  . CORONARY ARTERY BYPASS GRAFT  2003   LIMA TO THE LAD, RIMA TO THE RCA, AND A SAPHENOUS VEIN GRAFT TO THE INTERMEDIATE AND DISTAL LEFT CIRCUMFLEX  . US ECHOCARDIOGRAPHY  08/2009   SHOWED A MEAN GRADIENT  ACROSS IS AORTIC VALVE OF 24 MM OF MERCURY. HE HAD MODERATE LVH AND NORMAL LV FUNCTION    Current Medications: Current Meds  Medication Sig  . acetaminophen (TYLENOL) 325 MG tablet Take 650 mg by mouth every 6 (six) hours as needed.  Marland Kitchen allopurinol (ZYLOPRIM) 300 MG tablet Take 300 mg by mouth daily.    Marland Kitchen amLODipine (NORVASC) 10 MG tablet Take 1 tablet (10 mg total) by mouth daily. Patient must keep 12/20/18 appointment for further refills  . aspirin EC 81 MG tablet Take 1 tablet (81 mg total) by mouth daily.  . Cholecalciferol (VITAMIN D3) 1000 UNITS CAPS Take by mouth daily.    . clindamycin (CLEOCIN) 150 MG capsule Take 150 mg by mouth as needed. 4 Capsules 98mins prior to dental appt  (600mg )  . CRESTOR 20 MG tablet Take 0.5 tablets by mouth Daily. Pt takes generic - rosuvastin  1/2 daily  . famotidine (PEPCID) 20 MG tablet Take 20 mg by mouth daily.    . hydrochlorothiazide 25 MG tablet Take 25 mg by mouth daily.    Marland Kitchen ketoconazole (NIZORAL) 2 % cream Apply 1 application topically daily as needed for irritation.  . metoprolol (LOPRESSOR) 50 MG tablet Take 50 mg by mouth 2 (two) times daily.    . Multiple  Vitamins-Minerals (CENTRUM SILVER) tablet Take 1 tablet by mouth daily.    . Sennosides (SENOKOT PO) Take by mouth as needed. FOR CONSTIPATION  . spironolactone (ALDACTONE) 25 MG tablet Take 1 tablet (25 mg total) by mouth daily. Please keep upcoming appt in February for future refills. Thank you  . Tamsulosin HCl (FLOMAX) 0.4 MG CAPS Take 0.4 mg by mouth daily.    . [DISCONTINUED] ramipril (ALTACE) 10 MG capsule Take 10 mg by mouth 2 (two) times daily.     Allergies:   Ibuprofen   Social History   Socioeconomic History  . Marital status: Married    Spouse name: Not on file  . Number of children: Not on file  . Years of education: Not on file  . Highest education level: Not on file  Occupational History  . Not on file  Social Needs  . Financial resource strain: Not on file  .  Food insecurity:    Worry: Not on file    Inability: Not on file  . Transportation needs:    Medical: Not on file    Non-medical: Not on file  Tobacco Use  . Smoking status: Former Smoker    Packs/day: 1.50    Years: 7.00    Pack years: 10.50    Types: Cigarettes    Last attempt to quit: 04/03/1971    Years since quitting: 47.7  . Smokeless tobacco: Never Used  Substance and Sexual Activity  . Alcohol use: Yes  . Drug use: No  . Sexual activity: Not on file  Lifestyle  . Physical activity:    Days per week: Not on file    Minutes per session: Not on file  . Stress: Not on file  Relationships  . Social connections:    Talks on phone: Not on file    Gets together: Not on file    Attends religious service: Not on file    Active member of club or organization: Not on file    Attends meetings of clubs or organizations: Not on file    Relationship status: Not on file  Other Topics Concern  . Not on file  Social History Narrative  . Not on file     Family History: The patient's family history includes Heart attack in his father; Pneumonia (age of onset: 62) in his mother.  ROS:   Please see the history of present illness.    Skipped and irregular heartbeats.  All other systems reviewed and are negative.  EKGs/Labs/Other Studies Reviewed:    The following studies were reviewed today:  Bilateral carotid Doppler November 23, 2018: Summary: Right Carotid: Velocities in the right ICA are consistent with a 1-39% stenosis,                status post carotid endarterectomy. Mild hyperplasia at the                proximal patch site. .  Left Carotid: Velocities in the left ICA are consistent with a 1-39% stenosis. 2D Doppler echocardiogram May 2019 at [place] ---------------------------------------------------------------   NORMAL LEFT VENTRICULAR SYSTOLIC FUNCTION WITH MILD LVH   NORMAL RIGHT VENTRICULAR SYSTOLIC FUNCTION   VALVULAR  REGURGITATION: TRIVIAL MR, TRIVIAL PR, TRIVIAL TR  EKG:  EKG performed on today's visit 12/20/2018 reveals sinus rhythm, right bundle branch block, first-degree AV block, and ventricular trigeminy.  When compared to September 2018, ventricular ectopy.  Recent Labs: No results found for requested labs within last  8760 hours.  Recent Lipid Panel    Component Value Date/Time   CHOL 109 11/29/2014 1630   TRIG 104.0 11/29/2014 1630   HDL 42.40 11/29/2014 1630   CHOLHDL 3 11/29/2014 1630   VLDL 20.8 11/29/2014 1630   LDLCALC 46 11/29/2014 1630    Physical Exam:    VS:  BP 126/60   Pulse 69   Ht 5\' 10"  (1.778 m)   Wt 263 lb 6.4 oz (119.5 kg)   SpO2 99%   BMI 37.79 kg/m     Wt Readings from Last 3 Encounters:  12/20/18 263 lb 6.4 oz (119.5 kg)  11/23/18 250 lb (113.4 kg)  10/05/18 260 lb 9.6 oz (118.2 kg)     GEN: Obesity. No acute distress HEENT: Normal NECK: No JVD. LYMPHATICS: No lymphadenopathy CARDIAC: RRR.  2/6 systolic murmur, S4 gallop, no edema VASCULAR: 2+ bilateral radial pulses, no bruits RESPIRATORY:  Clear to auscultation without rales, wheezing or rhonchi  ABDOMEN: Soft, non-tender, non-distended, No pulsatile mass, MUSCULOSKELETAL: No deformity  SKIN: Warm and dry NEUROLOGIC:  Alert and oriented x 3 PSYCHIATRIC:  Normal affect   ASSESSMENT:    1. Nonrheumatic aortic valve stenosis   2. Essential hypertension   3. Coronary artery disease involving native coronary artery of native heart without angina pectoris   4. Bilateral carotid artery occlusion   5. Other hyperlipidemia   6. Obstructive sleep apnea   7. Ventricular trigeminy    PLAN:    In order of problems listed above:  1. Status post transaortic valve replacement with normal function.  Recent echocardiogram from May done at [facility] demonstrated normal gradients with minimal regurgitation.  The Prosthesis Is a Direct Flow Medical bioprosthesis.  He is doing well.  Recent  echo suggest hemodynamics are good. 2. The blood pressure is low normal.  Continue current therapy as outlined. 3. Asymptomatic with reference to angina.  Aggressive secondary risk prevention is recommended. 4. Recent carotid evaluation demonstrated stable anatomy.  Continue aggressive secondary risk modification as outlined below. 5. LDL target less than 70.  Continue statin therapy. 6. Compliance with CPAP is confirmed. 7. ECG demonstrates ventricular trigeminy.  Burden of PVCs is unknown.  LV function as of 6 months ago is not showing evidence of impairment.  24-hour Holter monitor to quantitate PVC burden.  Overall education and awareness concerning primary/secondary risk prevention was discussed in detail: LDL less than 70, hemoglobin A1c less than 7, blood pressure target less than 130/80 mmHg, >150 minutes of moderate aerobic activity per week, avoidance of smoking, weight control (via diet and exercise), and continued surveillance/management of/for obstructive sleep apnea.  1 year clinical follow-up.   Medication Adjustments/Labs and Tests Ordered: Current medicines are reviewed at length with the patient today.  Concerns regarding medicines are outlined above.  Orders Placed This Encounter  Procedures  . Basic Metabolic Panel (BMET)  . Holter monitor - 24 hour  . EKG 12-Lead   Meds ordered this encounter  Medications  . ramipril (ALTACE) 10 MG capsule    Sig: Take 1 capsule (10 mg total) by mouth daily.    Dispense:  90 capsule    Refill:  3    Patient Instructions  Medication Instructions:  Your physician has recommended you make the following change in your medication:  DECREASE Ramipril (Altace) to 10 mg once daily  If you need a refill on your cardiac medications before your next appointment, please call your pharmacy.    Lab work:  Your physician recommends that you return for lab work in: 3 weeks for basic metabolic panel    Testing/Procedures: Your physician  has recommended that you wear a holter monitor. Holter monitors are medical devices that record the heart's electrical activity. Doctors most often use these monitors to diagnose arrhythmias. Arrhythmias are problems with the speed or rhythm of the heartbeat. The monitor is a small, portable device. You can wear one while you do your normal daily activities. This is usually used to diagnose what is causing palpitations/syncope (passing out).    Follow-Up: At [facility], you and your health needs are our priority.  As part of our continuing mission to provide you with exceptional heart care, we have created designated Provider Care Teams.  These Care Teams include your primary Cardiologist (physician) and Advanced Practice Providers (APPs -  Physician Assistants and Nurse Practitioners) who all work together to provide you with the care you need, when you need it. You will need a follow up appointment in 1 years.  Please call our office 2 months in advance to schedule this appointment.  You may see Sinclair Grooms, MD or one of the following Advanced Practice Providers on your designated Care Team:   Truitt Merle, NP Cecilie Kicks, NP . Kathyrn Drown, NP       Signed, Sinclair Grooms, MD  12/20/2018 12:59 PM    [place]

## 2018-12-20 ENCOUNTER — Encounter: Payer: Self-pay | Admitting: Interventional Cardiology

## 2018-12-20 ENCOUNTER — Ambulatory Visit (INDEPENDENT_AMBULATORY_CARE_PROVIDER_SITE_OTHER): Payer: Medicare Other | Admitting: Interventional Cardiology

## 2018-12-20 VITALS — BP 126/60 | HR 69 | Ht 70.0 in | Wt 263.4 lb

## 2018-12-20 DIAGNOSIS — I6523 Occlusion and stenosis of bilateral carotid arteries: Secondary | ICD-10-CM

## 2018-12-20 DIAGNOSIS — E7849 Other hyperlipidemia: Secondary | ICD-10-CM

## 2018-12-20 DIAGNOSIS — I2510 Atherosclerotic heart disease of native coronary artery without angina pectoris: Secondary | ICD-10-CM

## 2018-12-20 DIAGNOSIS — I10 Essential (primary) hypertension: Secondary | ICD-10-CM

## 2018-12-20 DIAGNOSIS — I350 Nonrheumatic aortic (valve) stenosis: Secondary | ICD-10-CM

## 2018-12-20 DIAGNOSIS — I498 Other specified cardiac arrhythmias: Secondary | ICD-10-CM | POA: Diagnosis not present

## 2018-12-20 DIAGNOSIS — G4733 Obstructive sleep apnea (adult) (pediatric): Secondary | ICD-10-CM

## 2018-12-20 MED ORDER — RAMIPRIL 10 MG PO CAPS: 10.0000 mg | ORAL_CAPSULE | Freq: Every day | ORAL | 3 refills | Status: DC

## 2018-12-20 NOTE — Patient Instructions (Addendum)
Medication Instructions:  Your physician has recommended you make the following change in your medication:  DECREASE Ramipril (Altace) to 10 mg once daily  If you need a refill on your cardiac medications before your next appointment, please call your pharmacy.    Lab work: Your physician recommends that you return for lab work in: 3 weeks for basic metabolic panel    Testing/Procedures: Your physician has recommended that you wear a holter monitor. Holter monitors are medical devices that record the heart's electrical activity. Doctors most often use these monitors to diagnose arrhythmias. Arrhythmias are problems with the speed or rhythm of the heartbeat. The monitor is a small, portable device. You can wear one while you do your normal daily activities. This is usually used to diagnose what is causing palpitations/syncope (passing out).    Follow-Up: At [facility], you and your health needs are our priority.  As part of our continuing mission to provide you with exceptional heart care, we have created designated Provider Care Teams.  These Care Teams include your primary Cardiologist (physician) and Advanced Practice Providers (APPs -  Physician Assistants and Nurse Practitioners) who all work together to provide you with the care you need, when you need it. You will need a follow up appointment in 1 years.  Please call our office 2 months in advance to schedule this appointment.  You may see Sinclair Grooms, MD or one of the following Advanced Practice Providers on your designated Care Team:   Truitt Merle, NP Cecilie Kicks, NP . Kathyrn Drown, NP

## 2018-12-26 ENCOUNTER — Other Ambulatory Visit: Payer: Self-pay | Admitting: Interventional Cardiology

## 2018-12-27 ENCOUNTER — Encounter

## 2019-01-11 ENCOUNTER — Other Ambulatory Visit: Payer: Self-pay | Admitting: Interventional Cardiology

## 2019-01-11 ENCOUNTER — Ambulatory Visit (INDEPENDENT_AMBULATORY_CARE_PROVIDER_SITE_OTHER): Payer: Medicare Other

## 2019-01-11 ENCOUNTER — Other Ambulatory Visit: Payer: Medicare Other | Admitting: *Deleted

## 2019-01-11 DIAGNOSIS — I493 Ventricular premature depolarization: Secondary | ICD-10-CM

## 2019-01-11 DIAGNOSIS — I498 Other specified cardiac arrhythmias: Secondary | ICD-10-CM | POA: Diagnosis not present

## 2019-01-11 DIAGNOSIS — E7849 Other hyperlipidemia: Secondary | ICD-10-CM

## 2019-01-11 DIAGNOSIS — I6523 Occlusion and stenosis of bilateral carotid arteries: Secondary | ICD-10-CM

## 2019-01-11 DIAGNOSIS — I2510 Atherosclerotic heart disease of native coronary artery without angina pectoris: Secondary | ICD-10-CM | POA: Diagnosis not present

## 2019-01-11 DIAGNOSIS — I350 Nonrheumatic aortic (valve) stenosis: Secondary | ICD-10-CM | POA: Diagnosis not present

## 2019-01-11 DIAGNOSIS — G4733 Obstructive sleep apnea (adult) (pediatric): Secondary | ICD-10-CM

## 2019-01-11 DIAGNOSIS — I10 Essential (primary) hypertension: Secondary | ICD-10-CM | POA: Diagnosis not present

## 2019-01-11 LAB — BASIC METABOLIC PANEL
BUN/Creatinine Ratio: 27 — ABNORMAL HIGH (ref 10–24)
BUN: 31 mg/dL — AB (ref 8–27)
CO2: 23 mmol/L (ref 20–29)
Calcium: 9.4 mg/dL (ref 8.6–10.2)
Chloride: 102 mmol/L (ref 96–106)
Creatinine, Ser: 1.15 mg/dL (ref 0.76–1.27)
GFR calc Af Amer: 69 mL/min/{1.73_m2} (ref >59)
GFR calc non Af Amer: 60 mL/min/{1.73_m2} (ref >59)
GLUCOSE: 101 mg/dL — AB (ref 65–99)
Potassium: 4.4 mmol/L (ref 3.5–5.2)
Sodium: 138 mmol/L (ref 134–144)

## 2019-01-23 ENCOUNTER — Other Ambulatory Visit: Payer: Self-pay | Admitting: Interventional Cardiology

## 2019-02-02 ENCOUNTER — Other Ambulatory Visit: Payer: Self-pay

## 2019-02-02 ENCOUNTER — Emergency Department (HOSPITAL_COMMUNITY): Payer: Medicare Other

## 2019-02-02 ENCOUNTER — Encounter (HOSPITAL_COMMUNITY): Payer: Self-pay

## 2019-02-02 ENCOUNTER — Emergency Department (HOSPITAL_COMMUNITY)
Admission: EM | Admit: 2019-02-02 | Discharge: 2019-02-02 | Disposition: A | Discharge status: Home / Self Care | Payer: Medicare Other | Attending: Emergency Medicine | Admitting: Emergency Medicine

## 2019-02-02 DIAGNOSIS — R609 Edema, unspecified: Secondary | ICD-10-CM | POA: Diagnosis not present

## 2019-02-02 DIAGNOSIS — R52 Pain, unspecified: Secondary | ICD-10-CM | POA: Diagnosis not present

## 2019-02-02 DIAGNOSIS — W010XXA Fall on same level from slipping, tripping and stumbling without subsequent striking against object, initial encounter: Secondary | ICD-10-CM | POA: Diagnosis not present

## 2019-02-02 DIAGNOSIS — Z859 Personal history of malignant neoplasm, unspecified: Secondary | ICD-10-CM | POA: Diagnosis not present

## 2019-02-02 DIAGNOSIS — Y92007 Garden or yard of unspecified non-institutional (private) residence as the place of occurrence of the external cause: Secondary | ICD-10-CM | POA: Insufficient documentation

## 2019-02-02 DIAGNOSIS — I2510 Atherosclerotic heart disease of native coronary artery without angina pectoris: Secondary | ICD-10-CM | POA: Diagnosis not present

## 2019-02-02 DIAGNOSIS — I10 Essential (primary) hypertension: Secondary | ICD-10-CM | POA: Insufficient documentation

## 2019-02-02 DIAGNOSIS — S82831A Other fracture of upper and lower end of right fibula, initial encounter for closed fracture: Secondary | ICD-10-CM | POA: Diagnosis not present

## 2019-02-02 DIAGNOSIS — S8261XA Displaced fracture of lateral malleolus of right fibula, initial encounter for closed fracture: Secondary | ICD-10-CM | POA: Diagnosis not present

## 2019-02-02 DIAGNOSIS — Z7982 Long term (current) use of aspirin: Secondary | ICD-10-CM | POA: Insufficient documentation

## 2019-02-02 DIAGNOSIS — S82891A Other fracture of right lower leg, initial encounter for closed fracture: Secondary | ICD-10-CM

## 2019-02-02 DIAGNOSIS — Y93H2 Activity, gardening and landscaping: Secondary | ICD-10-CM | POA: Insufficient documentation

## 2019-02-02 DIAGNOSIS — S82431A Displaced oblique fracture of shaft of right fibula, initial encounter for closed fracture: Secondary | ICD-10-CM | POA: Diagnosis not present

## 2019-02-02 DIAGNOSIS — S92131A Displaced fracture of posterior process of right talus, initial encounter for closed fracture: Secondary | ICD-10-CM | POA: Diagnosis not present

## 2019-02-02 DIAGNOSIS — Z79899 Other long term (current) drug therapy: Secondary | ICD-10-CM | POA: Insufficient documentation

## 2019-02-02 DIAGNOSIS — Y999 Unspecified external cause status: Secondary | ICD-10-CM | POA: Diagnosis not present

## 2019-02-02 DIAGNOSIS — W19XXXA Unspecified fall, initial encounter: Secondary | ICD-10-CM | POA: Diagnosis not present

## 2019-02-02 DIAGNOSIS — S99911A Unspecified injury of right ankle, initial encounter: Secondary | ICD-10-CM | POA: Diagnosis present

## 2019-02-02 MED ORDER — ONDANSETRON HCL 4 MG/2ML IJ SOLN
4.0000 mg | Freq: Four times a day (QID) | INTRAMUSCULAR | Status: DC | PRN
  Administered 2019-02-02: 4 mg via INTRAVENOUS
  Filled 2019-02-02: qty 2

## 2019-02-02 MED ORDER — OXYCODONE-ACETAMINOPHEN 5-325 MG PO TABS: 1.0000 | ORAL_TABLET | ORAL | 0 refills | Status: DC | PRN

## 2019-02-02 MED ORDER — ETOMIDATE 2 MG/ML IV SOLN
0.2000 mg/kg | Freq: Once | INTRAVENOUS | Status: AC
  Administered 2019-02-02: 20 mg via INTRAVENOUS
  Filled 2019-02-02: qty 20

## 2019-02-02 MED ORDER — MORPHINE SULFATE (PF) 4 MG/ML IV SOLN
6.0000 mg | Freq: Once | INTRAVENOUS | Status: AC
  Administered 2019-02-02: 6 mg via INTRAVENOUS
  Filled 2019-02-02: qty 2

## 2019-02-02 MED ORDER — MORPHINE SULFATE (PF) 4 MG/ML IV SOLN
4.0000 mg | Freq: Once | INTRAVENOUS | Status: AC
  Administered 2019-02-02: 4 mg via INTRAVENOUS
  Filled 2019-02-02: qty 1

## 2019-02-02 NOTE — ED Notes (Signed)
Bed: SI73 Expected date:  Expected time:  Means of arrival:  Comments: EMS/82 y.o. ankle injury

## 2019-02-02 NOTE — ED Provider Notes (Signed)
[facility] DEPT Provider Note   CSN: [id number] Arrival date & time: 02/02/19  1357    History   Chief Complaint Chief Complaint  Patient presents with  . Ankle Injury    HPI JAYMOND WAAGE is a 83 y.o. male.     HPI Patient is an 83 year old male presents the emergency department acute onset right ankle pain and right ankle deformity after falling while mowing the lawn today.  He presents for evaluation.  His pain is moderate in severity and worse with movement and palpation of his right ankle.  No other injury.  No use of anticoagulants.  He is on a 81 mg aspirin daily.  He does not have an orthopedic surgeon.   Past Medical History:  Diagnosis Date  . Aortic stenosis    MODERATE  . Cancer ([place])   . Carotid arterial disease ([place])   . Coronary artery disease   . Dysuria   . Gout   . Hematuria   . History of urinary retention   . Hyperlipidemia   . Hypertension   . Obesity   . Sleep apnea     Patient Active Problem List   Diagnosis Date Noted  . 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 Accelerated hypertension 07/27/2017  . Right-sided chest wall pain 09/28/2014  . Occlusion and stenosis of carotid artery without mention of cerebral infarction 03/28/2014  . Acute bronchitis with asthma 08/25/2012  . Multiple lung nodules 06/20/2012  . Hyperlipidemia 10/02/2011  . CAD (coronary artery disease) 04/04/2011  . Hypertension   . Obesity   . Dysuria   . Hematuria   . Aortic stenosis   . Obstructive sleep apnea   . Gout   . Carotid arterial disease ([place])     Past Surgical History:  Procedure Laterality Date  . AORTIC VALVE REPAIR  February 06, 2015   Dr. Aline Brochure and Dr. Ysidro Evert  at [facility]  . [place]  02/2008  . CHOLECYSTECTOMY  04/2002  . CORONARY ARTERY BYPASS GRAFT  2003   LIMA TO THE LAD, RIMA TO THE RCA, AND A SAPHENOUS VEIN GRAFT TO THE INTERMEDIATE AND DISTAL LEFT CIRCUMFLEX   . US ECHOCARDIOGRAPHY  08/2009   SHOWED A MEAN GRADIENT ACROSS IS AORTIC VALVE OF 24 MM OF MERCURY. HE HAD MODERATE LVH AND NORMAL LV FUNCTION        Home Medications    Prior to Admission medications   Medication Sig Start Date End Date Taking? Authorizing Provider  allopurinol (ZYLOPRIM) 300 MG tablet Take 300 mg by mouth daily.     Yes [provider]  amLODipine (NORVASC) 10 MG tablet TAKE 1 TABLET BY MOUTH ONCE DAILY 12/29/18  Yes Belva Crome, MD  aspirin EC 81 MG tablet Take 81 mg by mouth at bedtime.  11/23/12  Yes Larey Dresser, MD  Cholecalciferol (VITAMIN D3) 1000 UNITS CAPS Take 1 capsule by mouth at bedtime.    Yes [provider]  clindamycin (CLEOCIN) 150 MG capsule Take 150 mg by mouth as needed. 4 Capsules 15mins prior to dental appt  (600mg )   Yes [provider]  diphenhydramine-acetaminophen (TYLENOL PM) 25-500 MG TABS tablet Take 1 tablet by mouth at bedtime as needed (sleep).   Yes [provider]  famotidine (PEPCID) 20 MG tablet Take 20 mg by mouth daily.     Yes [provider]  hydrochlorothiazide 25 MG tablet Take 25 mg by mouth daily.  Yes [provider]  HYDROcodone-acetaminophen (NORCO/VICODIN) 5-325 MG tablet Take 1 tablet by mouth every 4 (four) hours as needed for pain. 01/19/19  Yes [provider]  metoprolol (LOPRESSOR) 50 MG tablet Take 50 mg by mouth 2 (two) times daily.     Yes [provider]  Multiple Vitamins-Minerals (CENTRUM SILVER) tablet Take 1 tablet by mouth daily.     Yes [provider]  ramipril (ALTACE) 10 MG capsule Take 1 capsule (10 mg total) by mouth daily. 12/20/18 12/15/19 Yes Belva Crome, MD  rosuvastatin (CRESTOR) 20 MG tablet Take 10 mg by mouth every evening.   Yes [provider]  spironolactone (ALDACTONE) 25 MG tablet TAKE 1 TABLET BY MOUTH ONCE DAILY. PLEASE KEEP UPCOMING APPT IN FEB FOR FUTURE REFILLS Patient taking differently:  Take 25 mg by mouth every evening.  01/24/19  Yes Belva Crome, MD  Tamsulosin HCl (FLOMAX) 0.4 MG CAPS Take 0.4 mg by mouth every evening.    Yes [provider]  ketoconazole (NIZORAL) 2 % cream Apply 1 application topically daily as needed for irritation. 02/04/17   [provider]  oxyCODONE-acetaminophen (PERCOCET/ROXICET) 5-325 MG tablet Take 1 tablet by mouth every 4 (four) hours as needed for severe pain. 02/02/19   Jola Schmidt, MD  Sennosides (SENOKOT PO) Take 1 tablet by mouth daily as needed (constipation). FOR CONSTIPATION     [provider]    Family History Family History  Problem Relation Age of Onset  . Heart attack Father   . Pneumonia Mother 29    Social History Social History   Tobacco Use  . Smoking status: Former Smoker    Packs/day: 1.50    Years: 7.00    Pack years: 10.50    Types: Cigarettes    Last attempt to quit: 04/03/1971    Years since quitting: 47.8  . Smokeless tobacco: Never Used  Substance Use Topics  . Alcohol use: Yes  . Drug use: No     Allergies   Ibuprofen   Review of Systems Review of Systems  All other systems reviewed and are negative.    Physical Exam Updated Vital Signs BP 113/62   Pulse (!) 50   Temp 98 F (36.7 C) (Oral)   Resp 19   Ht 5\' 11"  (1.803 m)   Wt 113.4 kg   SpO2 99%   BMI 34.87 kg/m   Physical Exam Vitals signs and nursing note reviewed.  Constitutional:      Appearance: He is well-developed.  HENT:     Head: Normocephalic.  Neck:     Musculoskeletal: Normal range of motion.  Pulmonary:     Effort: Pulmonary effort is normal.  Abdominal:     General: There is no distension.  Musculoskeletal: Normal range of motion.     Comments: Obvious deformity of the right ankle consistent with right ankle fracture.  Normal pulses right foot.  Compartments of the right lower extremity are soft.  Full range of motion of right hip and right knee.  Neurological:     Mental Status:  He is alert and oriented to person, place, and time.      ED Treatments / Results  Labs (all labs ordered are listed, but only abnormal results are displayed) Labs Reviewed - No data to display  EKG None  Radiology Dg Ankle Complete Right  Result Date: 02/02/2019 CLINICAL DATA:  Post reduction. EXAM: RIGHT ANKLE - COMPLETE 3+ VIEW COMPARISON:  Right ankle x-rays  from same day. FINDINGS: Interval reduction of the tibiotalar subluxation, now in anatomic alignment. Improved alignment of the distal fibular fracture. The ankle mortise is symmetric. The talar dome is intact. Joint spaces are preserved. Diffuse soft tissue swelling. IMPRESSION: 1. Interval reduction of the tibiotalar subluxation and distal fibular fracture, now in anatomic alignment. Electronically Signed   By: Titus Dubin M.D.   On: 02/02/2019 16:34   Dg Ankle Complete Right  Result Date: 02/02/2019 CLINICAL DATA:  Recent fall with lateral ankle pain, initial encounter EXAM: RIGHT ANKLE - COMPLETE 3+ VIEW COMPARISON:  None. FINDINGS: There is an oblique fracture through the distal right fibula with lateral displacement of the distal fracture fragment. The talus is intimately apposed to the distal fibular fragment and laterally subluxed when compared with the adjacent talus. There is widening of the tibiotalar joint medially of approximately 13 mm. Some mild posterior subluxation of the talus is noted as well. Considerable soft tissue swelling is seen. No other fractures are noted. IMPRESSION: Oblique fracture of the distal right fibula with posterior and lateral displacement of the talus with respect to the distal tibia. Electronically Signed   By: Inez Catalina M.D.   On: 02/02/2019 15:06    Procedures .Sedation Performed by: Jola Schmidt, MD Authorized by: Jola Schmidt, MD   Consent:    Consent obtained:  Verbal   Consent given by:  Patient   Risks discussed:  Inadequate sedation, nausea, vomiting, prolonged sedation  necessitating reversal, prolonged hypoxia resulting in organ damage and dysrhythmia Universal protocol:    Immediately prior to procedure a time out was called: yes   Indications:    Procedure performed:  Fracture reduction Pre-sedation assessment:    Time since last food or drink:  5 hours   ASA classification: class 3 - patient with severe systemic disease     Neck mobility: normal     Mouth opening:  3 or more finger widths   Mallampati score:  III - soft palate, base of uvula visible   Pre-sedation assessments completed and reviewed: airway patency, cardiovascular function, hydration status, nausea/vomiting and pain level   Immediate pre-procedure details:    Reassessment: Patient reassessed immediately prior to procedure     Reviewed: vital signs and NPO status     Verified: bag valve mask available, emergency equipment available, intubation equipment available, IV patency confirmed and oxygen available   Procedure details (see MAR for exact dosages):    Preoxygenation:  Nasal cannula   Sedation:  Etomidate   Analgesia:  Morphine   Intra-procedure monitoring:  Blood pressure monitoring, cardiac monitor, continuous capnometry and continuous pulse oximetry   Intra-procedure events: none     Total Provider sedation time (minutes):  15 Post-procedure details:    Attendance: Constant attendance by certified staff until patient recovered     Recovery: Patient returned to pre-procedure baseline     Post-sedation assessments completed and reviewed: airway patency, cardiovascular function, nausea/vomiting and pain level     Patient is stable for discharge or admission: yes     Patient tolerance:  Tolerated well, no immediate complications Reduction of fracture Performed by: Jola Schmidt, MD Authorized by: Jola Schmidt, MD  Time out: Immediately prior to procedure a "time out" was called to verify the correct patient, procedure, equipment, support staff and site/side marked as required.  Comments:  Reduction of fracture Performed by: Jola Schmidt Consent: Verbal consent obtained. Risks and benefits: risks, benefits and alternatives were discussed Consent given by: patient Required items:  required blood products, implants, devices, and special equipment available Time out: Immediately prior to procedure a "time out" was called to verify the correct patient, procedure, equipment, support staff and site/side marked as required.  Patient sedated: etomidate, see note  Vitals: Vital signs were monitored during sedation. Patient tolerance: Patient tolerated the procedure well with no immediate complications. Joint: right ankle Reduction technique: manipulation  .Splint Application Performed by: Jola Schmidt, MD Authorized by: Jola Schmidt, MD   Post-procedure details:    Patient tolerance of procedure:  Tolerated well, no immediate complications Comments:     SPLINT APPLICATION Authorized by: Jola Schmidt Consent: Verbal consent obtained. Risks and benefits: risks, benefits and alternatives were discussed Consent given by: patient Splint applied by: orthopedic technician Location details: right lower ext Splint type: short leg with stirrup Supplies used: plaster Post-procedure: The splinted body part was neurovascularly unchanged following the procedure. Patient tolerance: Patient tolerated the procedure well with no immediate complications.      Medications Ordered in ED Medications  ondansetron (ZOFRAN) injection 4 mg (4 mg Intravenous Given 02/02/19 1424)  etomidate (AMIDATE) injection 22.68 mg (20 mg Intravenous Given 02/02/19 1603)  morphine 4 MG/ML injection 6 mg (6 mg Intravenous Given 02/02/19 1425)  morphine 4 MG/ML injection 4 mg (4 mg Intravenous Given 02/02/19 1630)     Initial Impression / Assessment and Plan / ED Course  I have reviewed the triage vital signs and the nursing notes.  Pertinent labs & imaging results that were available during my  care of the patient were reviewed by me and considered in my medical decision making (see chart for details).       Patient with obvious right ankle fracture.  Pain control now.  Plain films to be obtained.  Patient with right ankle fracture dislocation.  Will need sedation and reduction here in the emergency department.  Patient consents to reduction and sedation.  Patient tolerated sedation well.  No difficulty.  Successful reduction.  Splint applied.   Case discussed with Dr. Alma Friendly, orthopedic surgery, who personally reviewed the films.  He will see the patient in the office next week and discuss operative management.  Patient will be discharged in the emergency department.  Placed in a nonweightbearing status.  Dr. Chaney Malling is requesting 81 mg aspirin twice daily.  He will see the patient in follow-up next week.  This information was relayed to the patient as well as via phone to the patient's spouse who will come to the emergency department and pick the patient up.  Home on crutches.  Recommended elevation.  Final Clinical Impressions(s) / ED Diagnoses   Final diagnoses:  Closed fracture of right ankle, initial encounter    ED Discharge Orders         Ordered    oxyCODONE-acetaminophen (PERCOCET/ROXICET) 5-325 MG tablet  Every 4 hours PRN     02/02/19 1642           Jola Schmidt, MD 02/02/19 1722

## 2019-02-02 NOTE — Discharge Instructions (Addendum)
Take an 81 mg Aspirin twice daily  Please call the office of Dr Veverly Fells and schedule an appointment for next week. Dr Veverly Fells would like to see you around Thursday of next week.  Please let the front office staff know that this is an ER follow up and according to Dr Veverly Fells, "do not take No for an answer".   Elevate your leg ("toes above the nose")  Do not get your splint wet  No weight bearing on your right leg

## 2019-02-02 NOTE — Progress Notes (Signed)
Orthopedic Tech Progress Note Patient Details:  Andre Miranda 03/10/1936 [id number] Pt was not able to use crutches well so he's been advice to use a walker or wheelchair at home. Ortho Devices Type of Ortho Device: Crutches Splint Material: Plaster Ortho Device/Splint Location: Rt leg Ortho Device/Splint Interventions: Adjustment   Post Interventions Patient Tolerated: Well Instructions Provided: Care of device   [name] [facility] 02/02/2019, 6:07 PM

## 2019-02-02 NOTE — ED Notes (Signed)
XR at bedside

## 2019-02-02 NOTE — ED Triage Notes (Signed)
Per EMS: Pt from home, he was mowing the grass with a push mower.  Pt slipped and fell on his bottom.  Pt stated his foot angled to the right, and when he stood up his ankle was less angulated.

## 2019-02-02 NOTE — Sedation Documentation (Signed)
Ortho at bedside splinting ankle 

## 2019-02-02 NOTE — Sedation Documentation (Signed)
Splinting complete. 

## 2019-02-02 NOTE — ED Notes (Signed)
Pt did not do well with crutches, pt's wife was able to get a wheelchair.

## 2019-02-10 DIAGNOSIS — M25571 Pain in right ankle and joints of right foot: Secondary | ICD-10-CM | POA: Diagnosis not present

## 2019-02-11 ENCOUNTER — Other Ambulatory Visit: Payer: Self-pay

## 2019-02-11 ENCOUNTER — Encounter (HOSPITAL_BASED_OUTPATIENT_CLINIC_OR_DEPARTMENT_OTHER): Payer: Self-pay | Admitting: *Deleted

## 2019-02-14 ENCOUNTER — Ambulatory Visit (HOSPITAL_BASED_OUTPATIENT_CLINIC_OR_DEPARTMENT_OTHER): Payer: Medicare Other | Admitting: Anesthesiology

## 2019-02-14 ENCOUNTER — Ambulatory Visit (HOSPITAL_BASED_OUTPATIENT_CLINIC_OR_DEPARTMENT_OTHER)
Admission: RE | Admit: 2019-02-14 | Discharge: 2019-02-14 | Disposition: A | Discharge status: Home / Self Care | Payer: Medicare Other | Attending: Orthopedic Surgery | Admitting: Orthopedic Surgery

## 2019-02-14 ENCOUNTER — Encounter (HOSPITAL_BASED_OUTPATIENT_CLINIC_OR_DEPARTMENT_OTHER): Payer: Self-pay | Admitting: *Deleted

## 2019-02-14 ENCOUNTER — Other Ambulatory Visit: Payer: Self-pay

## 2019-02-14 ENCOUNTER — Encounter (HOSPITAL_BASED_OUTPATIENT_CLINIC_OR_DEPARTMENT_OTHER)
Admission: RE | Disposition: A | Discharge status: Home / Self Care | Payer: Self-pay | Source: Home / Self Care | Attending: Orthopedic Surgery

## 2019-02-14 DIAGNOSIS — G8918 Other acute postprocedural pain: Secondary | ICD-10-CM | POA: Diagnosis not present

## 2019-02-14 DIAGNOSIS — Z923 Personal history of irradiation: Secondary | ICD-10-CM | POA: Diagnosis not present

## 2019-02-14 DIAGNOSIS — M109 Gout, unspecified: Secondary | ICD-10-CM | POA: Diagnosis not present

## 2019-02-14 DIAGNOSIS — S9304XA Dislocation of right ankle joint, initial encounter: Secondary | ICD-10-CM | POA: Diagnosis not present

## 2019-02-14 DIAGNOSIS — E785 Hyperlipidemia, unspecified: Secondary | ICD-10-CM | POA: Insufficient documentation

## 2019-02-14 DIAGNOSIS — Z87891 Personal history of nicotine dependence: Secondary | ICD-10-CM | POA: Diagnosis not present

## 2019-02-14 DIAGNOSIS — Z8546 Personal history of malignant neoplasm of prostate: Secondary | ICD-10-CM | POA: Insufficient documentation

## 2019-02-14 DIAGNOSIS — W1781XA Fall down embankment (hill), initial encounter: Secondary | ICD-10-CM | POA: Insufficient documentation

## 2019-02-14 DIAGNOSIS — S82891A Other fracture of right lower leg, initial encounter for closed fracture: Secondary | ICD-10-CM | POA: Diagnosis not present

## 2019-02-14 DIAGNOSIS — I2510 Atherosclerotic heart disease of native coronary artery without angina pectoris: Secondary | ICD-10-CM | POA: Diagnosis not present

## 2019-02-14 DIAGNOSIS — Z951 Presence of aortocoronary bypass graft: Secondary | ICD-10-CM | POA: Insufficient documentation

## 2019-02-14 DIAGNOSIS — Z7901 Long term (current) use of anticoagulants: Secondary | ICD-10-CM | POA: Insufficient documentation

## 2019-02-14 DIAGNOSIS — G4730 Sleep apnea, unspecified: Secondary | ICD-10-CM | POA: Insufficient documentation

## 2019-02-14 DIAGNOSIS — S82841A Displaced bimalleolar fracture of right lower leg, initial encounter for closed fracture: Secondary | ICD-10-CM | POA: Diagnosis not present

## 2019-02-14 DIAGNOSIS — Z9989 Dependence on other enabling machines and devices: Secondary | ICD-10-CM | POA: Insufficient documentation

## 2019-02-14 DIAGNOSIS — K219 Gastro-esophageal reflux disease without esophagitis: Secondary | ICD-10-CM | POA: Diagnosis not present

## 2019-02-14 DIAGNOSIS — Z79899 Other long term (current) drug therapy: Secondary | ICD-10-CM | POA: Diagnosis not present

## 2019-02-14 DIAGNOSIS — I10 Essential (primary) hypertension: Secondary | ICD-10-CM | POA: Diagnosis not present

## 2019-02-14 DIAGNOSIS — Z7982 Long term (current) use of aspirin: Secondary | ICD-10-CM | POA: Insufficient documentation

## 2019-02-14 HISTORY — DX: Gastro-esophageal reflux disease without esophagitis: K21.9

## 2019-02-14 HISTORY — PX: ORIF ANKLE FRACTURE: SHX5408

## 2019-02-14 SURGERY — OPEN REDUCTION INTERNAL FIXATION (ORIF) ANKLE FRACTURE
Anesthesia: General | Site: Ankle | Laterality: Right

## 2019-02-14 MED ORDER — LIDOCAINE 2% (20 MG/ML) 5 ML SYRINGE
INTRAMUSCULAR | Status: AC
  Filled 2019-02-14: qty 5

## 2019-02-14 MED ORDER — OXYCODONE HCL 5 MG/5ML PO SOLN: 5.0000 mg | Freq: Once | ORAL | Status: DC | PRN

## 2019-02-14 MED ORDER — MIDAZOLAM HCL 2 MG/2ML IJ SOLN
1.0000 mg | INTRAMUSCULAR | Status: DC | PRN
  Administered 2019-02-14: 0.5 mg via INTRAVENOUS

## 2019-02-14 MED ORDER — FENTANYL CITRATE (PF) 100 MCG/2ML IJ SOLN
INTRAMUSCULAR | Status: AC
  Filled 2019-02-14: qty 2

## 2019-02-14 MED ORDER — DEXAMETHASONE SODIUM PHOSPHATE 10 MG/ML IJ SOLN
INTRAMUSCULAR | Status: DC | PRN
  Administered 2019-02-14: 10 mg via INTRAVENOUS

## 2019-02-14 MED ORDER — ONDANSETRON HCL 4 MG/2ML IJ SOLN
INTRAMUSCULAR | Status: AC
  Filled 2019-02-14: qty 2

## 2019-02-14 MED ORDER — LIDOCAINE 2% (20 MG/ML) 5 ML SYRINGE
INTRAMUSCULAR | Status: DC | PRN
  Administered 2019-02-14: 60 mg via INTRAVENOUS

## 2019-02-14 MED ORDER — PROPOFOL 10 MG/ML IV BOLUS
INTRAVENOUS | Status: AC
  Filled 2019-02-14: qty 20

## 2019-02-14 MED ORDER — PROPOFOL 10 MG/ML IV BOLUS
INTRAVENOUS | Status: DC | PRN
  Administered 2019-02-14: 80 mg via INTRAVENOUS
  Administered 2019-02-14: 120 mg via INTRAVENOUS

## 2019-02-14 MED ORDER — FENTANYL CITRATE (PF) 100 MCG/2ML IJ SOLN
INTRAMUSCULAR | Status: DC | PRN
  Administered 2019-02-14: 50 ug via INTRAVENOUS

## 2019-02-14 MED ORDER — FENTANYL CITRATE (PF) 100 MCG/2ML IJ SOLN
25.0000 ug | INTRAMUSCULAR | Status: DC | PRN
  Administered 2019-02-14 (×2): 25 ug via INTRAVENOUS
  Administered 2019-02-14: 50 ug via INTRAVENOUS

## 2019-02-14 MED ORDER — OXYCODONE-ACETAMINOPHEN 5-325 MG PO TABS: 1.0000 | ORAL_TABLET | ORAL | 0 refills | Status: DC | PRN

## 2019-02-14 MED ORDER — DEXAMETHASONE SODIUM PHOSPHATE 10 MG/ML IJ SOLN
INTRAMUSCULAR | Status: AC
  Filled 2019-02-14: qty 1

## 2019-02-14 MED ORDER — MIDAZOLAM HCL 2 MG/2ML IJ SOLN
INTRAMUSCULAR | Status: AC
  Filled 2019-02-14: qty 2

## 2019-02-14 MED ORDER — SCOPOLAMINE 1 MG/3DAYS TD PT72: 1.0000 | MEDICATED_PATCH | Freq: Once | TRANSDERMAL | Status: DC | PRN

## 2019-02-14 MED ORDER — CEFAZOLIN SODIUM-DEXTROSE 2-4 GM/100ML-% IV SOLN
2.0000 g | INTRAVENOUS | Status: AC
  Administered 2019-02-14: 2 g via INTRAVENOUS

## 2019-02-14 MED ORDER — FENTANYL CITRATE (PF) 100 MCG/2ML IJ SOLN
50.0000 ug | INTRAMUSCULAR | Status: DC | PRN
  Administered 2019-02-14: 50 ug via INTRAVENOUS

## 2019-02-14 MED ORDER — ONDANSETRON HCL 4 MG/2ML IJ SOLN
4.0000 mg | Freq: Once | INTRAMUSCULAR | Status: AC | PRN
  Administered 2019-02-14: 4 mg via INTRAVENOUS

## 2019-02-14 MED ORDER — SUGAMMADEX SODIUM 500 MG/5ML IV SOLN
INTRAVENOUS | Status: DC | PRN
  Administered 2019-02-14: 400 mg via INTRAVENOUS

## 2019-02-14 MED ORDER — OXYCODONE HCL 5 MG PO TABS: 5.0000 mg | ORAL_TABLET | Freq: Once | ORAL | Status: DC | PRN

## 2019-02-14 MED ORDER — FENTANYL CITRATE (PF) 100 MCG/2ML IJ SOLN: 25.0000 ug | INTRAMUSCULAR | Status: DC | PRN

## 2019-02-14 MED ORDER — CHLORHEXIDINE GLUCONATE 4 % EX LIQD: 60.0000 mL | Freq: Once | CUTANEOUS | Status: DC

## 2019-02-14 MED ORDER — LACTATED RINGERS IV SOLN
INTRAVENOUS | Status: DC
  Administered 2019-02-14: 10:00:00 via INTRAVENOUS

## 2019-02-14 MED ORDER — ROCURONIUM BROMIDE 50 MG/5ML IV SOSY
PREFILLED_SYRINGE | INTRAVENOUS | Status: AC
  Filled 2019-02-14: qty 5

## 2019-02-14 MED ORDER — BUPIVACAINE HCL (PF) 0.25 % IJ SOLN
INTRAMUSCULAR | Status: AC
  Filled 2019-02-14: qty 30

## 2019-02-14 MED ORDER — ROCURONIUM BROMIDE 10 MG/ML (PF) SYRINGE
PREFILLED_SYRINGE | INTRAVENOUS | Status: DC | PRN
  Administered 2019-02-14: 40 mg via INTRAVENOUS

## 2019-02-14 MED ORDER — ONDANSETRON HCL 4 MG/2ML IJ SOLN: 4.0000 mg | Freq: Once | INTRAMUSCULAR | Status: DC | PRN

## 2019-02-14 MED ORDER — CEFAZOLIN SODIUM-DEXTROSE 2-4 GM/100ML-% IV SOLN
INTRAVENOUS | Status: AC
  Filled 2019-02-14: qty 100

## 2019-02-14 SURGICAL SUPPLY — 80 items
APL PRP STRL LF DISP 70% ISPRP (MISCELLANEOUS) ×2
APL SKNCLS STERI-STRIP NONHPOA (GAUZE/BANDAGES/DRESSINGS)
BAG DECANTER FOR FLEXI CONT (MISCELLANEOUS) IMPLANT
BANDAGE ACE 4X5 VEL STRL LF (GAUZE/BANDAGES/DRESSINGS) ×3 IMPLANT
BANDAGE ACE 6X5 VEL STRL LF (GAUZE/BANDAGES/DRESSINGS) ×3 IMPLANT
BANDAGE ESMARK 6X9 LF (GAUZE/BANDAGES/DRESSINGS) IMPLANT
BENZOIN TINCTURE PRP APPL 2/3 (GAUZE/BANDAGES/DRESSINGS) IMPLANT
BIT DRILL 2.5X2.75 QC CALB (BIT) ×2 IMPLANT
BIT DRILL 3.5X5.5 QC CALB (BIT) ×2 IMPLANT
BLADE SURG 15 STRL LF DISP TIS (BLADE) ×2 IMPLANT
BLADE SURG 15 STRL SS (BLADE) ×6
BNDG CMPR 9X6 STRL LF SNTH (GAUZE/BANDAGES/DRESSINGS)
BNDG ELASTIC 2X5.8 VLCR STR LF (GAUZE/BANDAGES/DRESSINGS) ×2 IMPLANT
BNDG ESMARK 6X9 LF (GAUZE/BANDAGES/DRESSINGS)
BNDG GAUZE ELAST 4 BULKY (GAUZE/BANDAGES/DRESSINGS) ×3 IMPLANT
CHLORAPREP W/TINT 26 (MISCELLANEOUS) ×4 IMPLANT
CLOSURE WOUND 1/2 X4 (GAUZE/BANDAGES/DRESSINGS)
COVER BACK TABLE REUSABLE LG (DRAPES) ×3 IMPLANT
COVER MAYO STAND REUSABLE (DRAPES) ×3 IMPLANT
COVER WAND RF STERILE (DRAPES) IMPLANT
CUFF TOURN SGL QUICK 34 (TOURNIQUET CUFF)
CUFF TRNQT CYL 34X4.125X (TOURNIQUET CUFF) IMPLANT
DECANTER SPIKE VIAL GLASS SM (MISCELLANEOUS) IMPLANT
DRAPE EXTREMITY T 121X128X90 (DISPOSABLE) ×3 IMPLANT
DRAPE OEC MINIVIEW 54X84 (DRAPES) ×3 IMPLANT
DRAPE SURG 17X23 STRL (DRAPES) ×3 IMPLANT
DURAPREP 26ML APPLICATOR (WOUND CARE) ×1 IMPLANT
ELECT NDL TIP 2.8 STRL (NEEDLE) IMPLANT
ELECT NEEDLE TIP 2.8 STRL (NEEDLE) ×3 IMPLANT
ELECT REM PT RETURN 9FT ADLT (ELECTROSURGICAL) ×3
ELECTRODE REM PT RTRN 9FT ADLT (ELECTROSURGICAL) ×1 IMPLANT
GAUZE SPONGE 4X4 12PLY STRL (GAUZE/BANDAGES/DRESSINGS) ×2 IMPLANT
GAUZE XEROFORM 1X8 LF (GAUZE/BANDAGES/DRESSINGS) ×3 IMPLANT
GLOVE BIOGEL PI IND STRL 7.0 (GLOVE) IMPLANT
GLOVE BIOGEL PI IND STRL 7.5 (GLOVE) ×1 IMPLANT
GLOVE BIOGEL PI IND STRL 8.5 (GLOVE) ×1 IMPLANT
GLOVE BIOGEL PI INDICATOR 7.0 (GLOVE) ×4
GLOVE BIOGEL PI INDICATOR 7.5 (GLOVE) ×2
GLOVE BIOGEL PI INDICATOR 8.5 (GLOVE) ×2
GLOVE SURG ORTHO 8.0 STRL STRW (GLOVE) ×3 IMPLANT
GOWN STRL REUS W/ TWL LRG LVL3 (GOWN DISPOSABLE) IMPLANT
GOWN STRL REUS W/ TWL XL LVL3 (GOWN DISPOSABLE) ×1 IMPLANT
GOWN STRL REUS W/TWL LRG LVL3 (GOWN DISPOSABLE)
GOWN STRL REUS W/TWL XL LVL3 (GOWN DISPOSABLE) ×3
NDL HYPO 25X1 1.5 SAFETY (NEEDLE) IMPLANT
NEEDLE HYPO 25X1 1.5 SAFETY (NEEDLE) IMPLANT
NS IRRIG 1000ML POUR BTL (IV SOLUTION) ×3 IMPLANT
PACK BASIN DAY SURGERY FS (CUSTOM PROCEDURE TRAY) ×3 IMPLANT
PAD CAST 4YDX4 CTTN HI CHSV (CAST SUPPLIES) ×1 IMPLANT
PADDING CAST ABS 4INX4YD NS (CAST SUPPLIES) ×2
PADDING CAST ABS COTTON 4X4 ST (CAST SUPPLIES) ×1 IMPLANT
PADDING CAST COTTON 4X4 STRL (CAST SUPPLIES) ×3
PENCIL BUTTON HOLSTER BLD 10FT (ELECTRODE) ×3 IMPLANT
PLATE TUB 100DEG 5 HO (Plate) ×2 IMPLANT
SCREW CORT FT 32X3.5XNONLOCK (Screw) IMPLANT
SCREW CORTICAL 3.5MM  16MM (Screw) ×4 IMPLANT
SCREW CORTICAL 3.5MM  32MM (Screw) ×2 IMPLANT
SCREW CORTICAL 3.5MM 14MM (Screw) ×2 IMPLANT
SCREW CORTICAL 3.5MM 16MM (Screw) IMPLANT
SCREW CORTICAL 3.5MM 32MM (Screw) ×1 IMPLANT
SHEET MEDIUM DRAPE 40X70 STRL (DRAPES) IMPLANT
SPLINT FAST PLASTER 5X30 (CAST SUPPLIES) ×4
SPLINT PLASTER CAST FAST 5X30 (CAST SUPPLIES) ×2 IMPLANT
STAPLER VISISTAT 35W (STAPLE) ×2 IMPLANT
STOCKINETTE TUBULAR 6 INCH (GAUZE/BANDAGES/DRESSINGS) ×3 IMPLANT
STRIP CLOSURE SKIN 1/2X4 (GAUZE/BANDAGES/DRESSINGS) IMPLANT
SUCTION FRAZIER HANDLE 10FR (MISCELLANEOUS) ×2
SUCTION TUBE FRAZIER 10FR DISP (MISCELLANEOUS) ×1 IMPLANT
SUT MNCRL AB 4-0 PS2 18 (SUTURE) IMPLANT
SUT VIC AB 2-0 CT1 27 (SUTURE) ×3
SUT VIC AB 2-0 CT1 TAPERPNT 27 (SUTURE) IMPLANT
SUT VIC AB 3-0 SH 27 (SUTURE)
SUT VIC AB 3-0 SH 27X BRD (SUTURE) IMPLANT
SYR BULB 3OZ (MISCELLANEOUS) ×2 IMPLANT
SYR CONTROL 10ML LL (SYRINGE) IMPLANT
TOWEL GREEN STERILE FF (TOWEL DISPOSABLE) ×3 IMPLANT
TUBE CONNECTING 20'X1/4 (TUBING) ×1
TUBE CONNECTING 20X1/4 (TUBING) ×2 IMPLANT
UNDERPAD 30X30 (UNDERPADS AND DIAPERS) ×3 IMPLANT
YANKAUER SUCT BULB TIP NO VENT (SUCTIONS) ×3 IMPLANT

## 2019-02-14 NOTE — Transfer of Care (Signed)
Immediate Anesthesia Transfer of Care Note  Patient: Andre Miranda  Procedure(s) Performed: OPEN REDUCTION INTERNAL FIXATION (ORIF) ANKLE FRACTURE (Right Ankle)  Patient Location: PACU  Anesthesia Type:General  Level of Consciousness: awake, alert  and oriented  Airway & Oxygen Therapy: Patient Spontanous Breathing and Patient connected to face mask oxygen  Post-op Assessment: Report given to RN and Post -op Vital signs reviewed and stable  Post vital signs: Reviewed and stable  Last Vitals:  Vitals Value Taken Time  BP    Temp    Pulse 68 02/14/2019 12:22 PM  Resp    SpO2 95 % 02/14/2019 12:22 PM  Vitals shown include unvalidated device data.  Last Pain:  Vitals:   02/14/19 0919  TempSrc: Oral  PainSc: 0-No pain         Complications: No apparent anesthesia complications

## 2019-02-14 NOTE — Discharge Instructions (Signed)
Strict Non-weight bearing on the right leg.  You may set your foot down on the ground for balance but not bear weight through it.  Use the wheel chair primarily with the walker for tight spaces.  Elevate both legs as you are able.  Use a compression hose on the left leg, knee high. Wear that 24/7  Wiggle the toes on the right foot to enhance circulation and move your knee and ankle on the left leg to improve circulation.  Re-start Lovenox tomorrow, Tuesday, March 31st in the AM.  Continue Lovenox 30 mg SQ twice daily until you are out of syringes or roughly 30 days.  Continue Aspirin 81 mg daily.  Follow up in the office with Dr Veverly Fells in two weeks.  Call [phone] for appt  Post Anesthesia Home Care Instructions  Activity: Get plenty of rest for the remainder of the day. A responsible individual must stay with you for 24 hours following the procedure.  For the next 24 hours, DO NOT: -Drive a car -Paediatric nurse -Drink alcoholic beverages -Take any medication unless instructed by your physician -Make any legal decisions or sign important papers.  Meals: Start with liquid foods such as gelatin or soup. Progress to regular foods as tolerated. Avoid greasy, spicy, heavy foods. If nausea and/or vomiting occur, drink only clear liquids until the nausea and/or vomiting subsides. Call your physician if vomiting continues.  Special Instructions/Symptoms: Your throat may feel dry or sore from the anesthesia or the breathing tube placed in your throat during surgery. If this causes discomfort, gargle with warm salt water. The discomfort should disappear within 24 hours.  Regional Anesthesia Blocks  1. Numbness or the inability to move the "blocked" extremity may last from 3-48 hours after placement. The length of time depends on the medication injected and your individual response to the medication. If the numbness is not going away after 48 hours, call your surgeon.  2. The extremity  that is blocked will need to be protected until the numbness is gone and the  Strength has returned. Because you cannot feel it, you will need to take extra care to avoid injury. Because it may be weak, you may have difficulty moving it or using it. You may not know what position it is in without looking at it while the block is in effect.  3. For blocks in the legs and feet, returning to weight bearing and walking needs to be done carefully. You will need to wait until the numbness is entirely gone and the strength has returned. You should be able to move your leg and foot normally before you try and bear weight or walk. You will need someone to be with you when you first try to ensure you do not fall and possibly risk injury.  4. Bruising and tenderness at the needle site are common side effects and will resolve in a few days.  5. Persistent numbness or new problems with movement should be communicated to the surgeon or the [place] [phone] [place] [phone]).

## 2019-02-14 NOTE — H&P (Signed)
Andre Miranda is an 83 y.o. male.   Chief Complaint: Right ankle injury HPI: 83 yo male who presented to the ER with a grossly unstable right ankle fracture dislocation a week ago.  He was reduced urgently in the ER and referred to ortho. He presents now for definitive fracture reduction and stabilization.    Past Medical History:  Diagnosis Date  . Aortic stenosis    MODERATE  . Cancer [facility])    prostate cancer-radiation  . Carotid arterial disease ([place])   . Coronary artery disease   . Dysuria   . GERD (gastroesophageal reflux disease)   . Gout   . Hematuria   . History of urinary retention   . Hyperlipidemia   . Hypertension   . Obesity   . Sleep apnea    uses CPAP nightly    Past Surgical History:  Procedure Laterality Date  . AORTIC VALVE REPAIR  February 06, 2015   Dr. Aline Brochure and Dr. Ysidro Evert  at [facility]  . [place]  02/2008  . CHOLECYSTECTOMY  04/2002  . CORONARY ARTERY BYPASS GRAFT  2003   LIMA TO THE LAD, RIMA TO THE RCA, AND A SAPHENOUS VEIN GRAFT TO THE INTERMEDIATE AND DISTAL LEFT CIRCUMFLEX  . US ECHOCARDIOGRAPHY  08/2009   SHOWED A MEAN GRADIENT ACROSS IS AORTIC VALVE OF 24 MM OF MERCURY. HE HAD MODERATE LVH AND NORMAL LV FUNCTION    Family History  Problem Relation Age of Onset  . Heart attack Father   . Pneumonia Mother 66   Social History:  reports that he quit smoking about 47 years ago. His smoking use included cigarettes. He has a 10.50 pack-year smoking history. He has never used smokeless tobacco. He reports current alcohol use. He reports that he does not use drugs.  Allergies:  Allergies  Allergen Reactions  . Ibuprofen Hypertension    Medications Prior to Admission  Medication Sig Dispense Refill  . allopurinol (ZYLOPRIM) 300 MG tablet Take 300 mg by mouth daily.      Marland Kitchen amLODipine (NORVASC) 10 MG tablet TAKE 1 TABLET BY MOUTH ONCE DAILY 90 tablet 3  . aspirin EC 81 MG tablet Take 81 mg by mouth at bedtime.     .  Cholecalciferol (VITAMIN D3) 1000 UNITS CAPS Take 1 capsule by mouth at bedtime.     . enoxaparin (LOVENOX) 30 MG/0.3ML injection Inject 30 mg into the skin every 12 (twelve) hours.    . famotidine (PEPCID) 20 MG tablet Take 20 mg by mouth daily.      . hydrochlorothiazide 25 MG tablet Take 25 mg by mouth daily.      . metoprolol (LOPRESSOR) 50 MG tablet Take 50 mg by mouth 2 (two) times daily.      . Multiple Vitamins-Minerals (CENTRUM SILVER) tablet Take 1 tablet by mouth daily.      Marland Kitchen oxyCODONE-acetaminophen (PERCOCET/ROXICET) 5-325 MG tablet Take 1 tablet by mouth every 4 (four) hours as needed for severe pain. 20 tablet 0  . ramipril (ALTACE) 10 MG capsule Take 1 capsule (10 mg total) by mouth daily. 90 capsule 3  . rosuvastatin (CRESTOR) 20 MG tablet Take 10 mg by mouth every evening.    . Sennosides (SENOKOT PO) Take 1 tablet by mouth daily as needed (constipation). FOR CONSTIPATION     . spironolactone (ALDACTONE) 25 MG tablet TAKE 1 TABLET BY MOUTH ONCE DAILY. PLEASE KEEP UPCOMING APPT IN FEB FOR FUTURE REFILLS (Patient taking differently: Take 25 mg by  mouth every evening. ) 90 tablet 3  . Tamsulosin HCl (FLOMAX) 0.4 MG CAPS Take 0.4 mg by mouth every evening.     . clindamycin (CLEOCIN) 150 MG capsule Take 150 mg by mouth as needed. 4 Capsules 23mins prior to dental appt  (600mg )    . diphenhydramine-acetaminophen (TYLENOL PM) 25-500 MG TABS tablet Take 1 tablet by mouth at bedtime as needed (sleep).    Marland Kitchen HYDROcodone-acetaminophen (NORCO/VICODIN) 5-325 MG tablet Take 1 tablet by mouth every 4 (four) hours as needed for pain.    Marland Kitchen ketoconazole (NIZORAL) 2 % cream Apply 1 application topically daily as needed for irritation.      No results found for this or any previous visit (from the past 48 hour(s)). No results found.  ROS  Height 5\' 10"  (1.778 m), weight 117.9 kg. Physical Exam  AAO, NAD, normal chest rise, heart regular Right LE: ankle immobilized in short leg splint. Able to  wiggle the toes, sensation intact, mod swelling Left LE with pain free AROM of the ankle, knee and hip  Assessment/Plan Displaced right ankle fracture. Recommend ORIF to restore joint alignment and stability. Patient agrees with the plan. Will be NWB for 6 weeks.  Augustin Schooling, MD 02/14/2019, 8:43 AM

## 2019-02-14 NOTE — Anesthesia Procedure Notes (Addendum)
Procedure Name: Intubation Date/Time: 02/14/2019 11:05 AM Performed by: Roberts Gaudy, MD Pre-anesthesia Checklist: Patient identified, Emergency Drugs available, Suction available and Patient being monitored Patient Re-evaluated:Patient Re-evaluated prior to induction Oxygen Delivery Method: Circle system utilized Preoxygenation: Pre-oxygenation with 100% oxygen Induction Type: IV induction Ventilation: Mask ventilation without difficulty Laryngoscope Size: Mac, Sabra Heck and 3 Grade View: Grade III Tube type: Oral Tube size: 8.0 mm Number of attempts: 2 Airway Equipment and Method: Stylet Placement Confirmation: ETT inserted through vocal cords under direct vision,  positive ETCO2 and breath sounds checked- equal and bilateral Secured at: 22 cm Tube secured with: Tape Dental Injury: Teeth and Oropharynx as per pre-operative assessment  Comments: DL x1 by R.Gabriel Earing, CRNA with [facility] blade.  Unable to visualize cords.  Only saw epiglottis.  DLx2 by Dr. Linna Caprice with [facility]. AOI VSS, +ETCO2

## 2019-02-14 NOTE — Progress Notes (Signed)
Assisted Dr. Linna Caprice with right, ultrasound guided, popliteal/saphenous block. Side rails up, monitors on throughout procedure. See vital signs in flow sheet. Tolerated Procedure well.

## 2019-02-14 NOTE — Op Note (Signed)
NAME: LAUREN, AGUAYO MEDICAL RECORD [id number] ACCOUNT [id number] DATE OF BIRTH:1935-11-29 FACILITY: [place] [facility] LOCATION: [facility] PHYSICIAN:STEVEN Orlena Sheldon, MD  OPERATIVE REPORT  DATE OF PROCEDURE:  02/14/2019  PREOPERATIVE DIAGNOSIS:  Right displaced ankle fracture dislocation.  POSTOPERATIVE DIAGNOSIS:  Right displaced ankle fracture dislocation.  PROCEDURE PERFORMED:  Open reduction internal fixation of right ankle fracture dislocation.  ATTENDING SURGEON:  [name] Plants, MD  ASSISTANT:  Darol Destine, [place], who was scrubbed during the entire procedure and necessary for satisfactory completion of surgery.  ANESTHESIA:  General anesthesia was used plus regional block.  ESTIMATED BLOOD LOSS:  Minimal.  FLUID REPLACEMENT:  1500 mL crystalloid.  INSTRUMENT COUNTS:  Correct.  COMPLICATIONS:  No complications.  ANTIBIOTICS:  Perioperative antibiotics were given.  TOURNIQUET TIME:  45 minutes at 300 mmHg.  INDICATIONS:  The patient is an 83 year old male who suffered a fall down a grass embankment, twisting his ankle and sustaining a fracture dislocation.  The patient presented initially to [facility] Emergency Department where he had a closed reduction  performed and placement in a splint.  The patient elevated his leg for a week, showed back up to orthopedics for followup, and skin swelling was down sufficiently to allow for open reduction internal fixation.  I discussed with the patient the need to  provide anatomic reduction of his fracture and stabilization given the intraarticular nature of his injury and the instability and the deltoid injury medially.  The patient agreed to this plan.  Risks and benefits of surgical management were discussed in  detail with the patient.  Informed consent was obtained.  DESCRIPTION OF PROCEDURE:  After an adequate level of anesthesia was achieved, the patient was positioned in the supine position, right leg correctly identified.   A bump was used under the right hip.  A nonsterile tourniquet was placed on the right  proximal thigh.  Right leg was sterilely prepped and draped in the usual manner.  Time-out was called verifying correct patient, correct site.  We elevated the leg and exsanguinated using an Esmarch bandage.  We elevated the tourniquet to 300 mmHg.   Time-out had been called prior to elevation of the tourniquet.  We then made a longitudinal incision over the distal fibula, dissection down through the subcutaneous tissues using the knife sharply to bone.  We then did a subperiosteal dissection of the  fracture site.  The patient had a Weber B oblique fracture, and we went ahead and removed hematoma from the fracture site using a Freer elevator, curette, and rongeur.  We were able to access the anterolateral gutter of the ankle joint and irrigated the  ankle joint out.  We also removed some debris from that area.  Next, we went ahead and reduced the fracture with a combination of internal rotation and longitudinal traction and then used a crab claw.  We then placed an anterior-to-posterior AO lag screw  using standard technique with drilling the near cortex with a 3.5 drill bit and a 2.5 drill bit for the far cortex.  We then placed the appropriate length compression screw across the fracture site.  We then made a decision based on the patient's age to  use a posterior antiglide plate rather than a lateral neutralization plate.  We placed a 5-hole 1/3 tubular plate across the posterior aspect of the fracture site and then used a clamp to secure that in place.  We then placed 3 bicortical screws in the  proximal fragment over the shaft of the  fibula, and this effectively closed the door on any displacement posteriorly which was the direction of displacement for this fracture and will prevent any shortening of the fractured fibula.  So we had a  combination of the AO interfragmentary screw with the posterior antiglide plate.   We obtained final x-rays.  The mortise was perfect, and the fracture site was anatomically reduced.  We irrigated thoroughly and closed in layers with 2-0 Vicryl for subcu  followed by staples for skin.  Sterile compressive bandage and short leg splint was placed.  The plaster was allowed to harden before we had the patient fully awakened and taken to recovery room in stable condition.  The patient will be nonweightbearing  for 6 weeks.  He will be following up with [place] in 2 weeks.  He is on Lovenox for DVT prophylaxis.  LN/NUANCE  D:02/14/2019 T:02/14/2019 JOB:[id number]

## 2019-02-14 NOTE — Brief Op Note (Signed)
02/14/2019  12:32 PM  PATIENT:  Andre Miranda  83 y.o. male  PRE-OPERATIVE DIAGNOSIS:  Right ankle fracture, displaced  POST-OPERATIVE DIAGNOSIS:  Right ankle fracture, displaced  PROCEDURE:  Procedure(s): OPEN REDUCTION INTERNAL FIXATION (ORIF) ANKLE FRACTURE (Right)   SURGEON:  Surgeon(s) and Role:    Netta Cedars, MD - Primary  PHYSICIAN ASSISTANT:   ASSISTANTS: [place] Bruns, PA-C   ANESTHESIA:   regional and general  EBL:  45 mL   BLOOD ADMINISTERED:none  DRAINS: none   LOCAL MEDICATIONS USED:  NONE  SPECIMEN:  No Specimen  DISPOSITION OF SPECIMEN:  N/A  COUNTS:  YES  TOURNIQUET:   Total Tourniquet Time Documented: Thigh (Right) - 47 minutes Total: Thigh (Right) - 47 minutes   DICTATION: .Other Dictation: Dictation Number [id number]  PLAN OF CARE: Discharge to home after PACU  PATIENT DISPOSITION:  PACU - hemodynamically stable.   Delay start of Pharmacological VTE agent (>24hrs) due to surgical blood loss or risk of bleeding: no

## 2019-02-14 NOTE — Anesthesia Procedure Notes (Signed)
Anesthesia Regional Block: Popliteal block   Pre-Anesthetic Checklist: ,, timeout performed, Correct Patient, Correct Site, Correct Laterality, Correct Procedure, Correct Position, site marked, Risks and benefits discussed,  Surgical consent,  Pre-op evaluation,  At surgeon's request and post-op pain management  Laterality: Right  Prep: chloraprep       Needles:  Injection technique: Single-shot  Needle Type: Stimulator Needle - 40     Needle Length: 9cm  Needle Gauge: 22     Additional Needles:   Procedures:,,,, ultrasound used (permanent image in chart),,,,  Narrative:  Start time: 02/14/2019 10:35 AM End time: 02/14/2019 10:40 AM Injection made incrementally with aspirations every 5 mL.  Performed by: Personally   Additional Notes: 20 cc 0.50% Bupivacaine with 1:200 epi injected easily

## 2019-02-14 NOTE — Anesthesia Preprocedure Evaluation (Signed)
Anesthesia Evaluation  Patient identified by MRN, date of birth, ID band Patient awake    Reviewed: Allergy & Precautions, NPO status , Patient's Chart, lab work & pertinent test results  Airway Mallampati: II  TM Distance: >3 FB Neck ROM: Full    Dental  (+) Teeth Intact, Dental Advisory Given   Pulmonary former smoker,    breath sounds clear to auscultation       Cardiovascular hypertension,  Rhythm:Regular Rate:Normal     Neuro/Psych    GI/Hepatic   Endo/Other    Renal/GU      Musculoskeletal   Abdominal   Peds  Hematology   Anesthesia Other Findings   Reproductive/Obstetrics                             Anesthesia Physical Anesthesia Plan  ASA: III  Anesthesia Plan: General   Post-op Pain Management:  Regional for Post-op pain   Induction: Intravenous  PONV Risk Score and Plan: Ondansetron and Dexamethasone  Airway Management Planned: Oral ETT  Additional Equipment:   Intra-op Plan:   Post-operative Plan: Extubation in OR  Informed Consent: I have reviewed the patients History and Physical, chart, labs and discussed the procedure including the risks, benefits and alternatives for the proposed anesthesia with the patient or authorized representative who has indicated his/her understanding and acceptance.     Dental advisory given  Plan Discussed with: Anesthesiologist and CRNA  Anesthesia Plan Comments:         Anesthesia Quick Evaluation

## 2019-02-14 NOTE — Anesthesia Procedure Notes (Signed)
Anesthesia Regional Block: Adductor canal block   Pre-Anesthetic Checklist: ,, timeout performed, Correct Patient, Correct Site, Correct Laterality, Correct Procedure, Correct Position, site marked, Risks and benefits discussed, pre-op evaluation,  At surgeon's request and post-op pain management  Laterality: Right  Prep: Maximum Sterile Barrier Precautions used, chloraprep       Needles:  Injection technique: Single-shot  Needle Type: Echogenic Stimulator Needle     Needle Length: 9cm  Needle Gauge: 21     Additional Needles:   Procedures:,,,, ultrasound used (permanent image in chart),,,,  Narrative:  Start time: 02/14/2019 10:40 AM End time: 02/14/2019 10:45 AM Injection made incrementally with aspirations every 5 mL.  Performed by: Personally  Anesthesiologist: Roberts Gaudy, MD  Additional Notes: 15 cc 0.75% Ropivacaine injected easily

## 2019-02-15 ENCOUNTER — Encounter (HOSPITAL_BASED_OUTPATIENT_CLINIC_OR_DEPARTMENT_OTHER): Payer: Self-pay | Admitting: Orthopedic Surgery

## 2019-02-15 DIAGNOSIS — Z923 Personal history of irradiation: Secondary | ICD-10-CM | POA: Diagnosis not present

## 2019-02-15 DIAGNOSIS — Z8546 Personal history of malignant neoplasm of prostate: Secondary | ICD-10-CM | POA: Diagnosis not present

## 2019-02-15 DIAGNOSIS — I2510 Atherosclerotic heart disease of native coronary artery without angina pectoris: Secondary | ICD-10-CM | POA: Diagnosis not present

## 2019-02-15 DIAGNOSIS — S82841A Displaced bimalleolar fracture of right lower leg, initial encounter for closed fracture: Secondary | ICD-10-CM | POA: Diagnosis not present

## 2019-02-15 DIAGNOSIS — I10 Essential (primary) hypertension: Secondary | ICD-10-CM | POA: Diagnosis not present

## 2019-02-15 DIAGNOSIS — G4730 Sleep apnea, unspecified: Secondary | ICD-10-CM | POA: Diagnosis not present

## 2019-02-15 NOTE — Anesthesia Postprocedure Evaluation (Signed)
Anesthesia Post Note  Patient: [name]  Procedure(s) Performed: OPEN REDUCTION INTERNAL FIXATION (ORIF) ANKLE FRACTURE (Right Ankle)     Patient location during evaluation: PACU Anesthesia Type: General Level of consciousness: awake and alert Pain management: pain level controlled Vital Signs Assessment: post-procedure vital signs reviewed and stable Respiratory status: spontaneous breathing, nonlabored ventilation, respiratory function stable and patient connected to nasal cannula oxygen Cardiovascular status: blood pressure returned to baseline and stable Postop Assessment: no apparent nausea or vomiting Anesthetic complications: no    Last Vitals:  Vitals:   02/14/19 1324 02/14/19 1339  BP:  (!) 155/71  Pulse: (!) 53 (!) 59  Resp: 14 15  Temp:  36.6 C  SpO2: 93% 98%    Last Pain:  Vitals:   02/15/19 0916  TempSrc:   PainSc: 0-No pain                 Jearld Hemp COKER

## 2019-03-01 DIAGNOSIS — Z4789 Encounter for other orthopedic aftercare: Secondary | ICD-10-CM | POA: Diagnosis not present

## 2019-03-22 DIAGNOSIS — Z4789 Encounter for other orthopedic aftercare: Secondary | ICD-10-CM | POA: Diagnosis not present

## 2019-03-22 DIAGNOSIS — M25571 Pain in right ankle and joints of right foot: Secondary | ICD-10-CM | POA: Diagnosis not present

## 2019-04-07 DIAGNOSIS — I493 Ventricular premature depolarization: Secondary | ICD-10-CM | POA: Diagnosis not present

## 2019-04-07 DIAGNOSIS — I517 Cardiomegaly: Secondary | ICD-10-CM | POA: Diagnosis not present

## 2019-04-07 DIAGNOSIS — I2510 Atherosclerotic heart disease of native coronary artery without angina pectoris: Secondary | ICD-10-CM | POA: Diagnosis not present

## 2019-04-07 DIAGNOSIS — Z952 Presence of prosthetic heart valve: Secondary | ICD-10-CM | POA: Diagnosis not present

## 2019-04-07 DIAGNOSIS — Z48812 Encounter for surgical aftercare following surgery on the circulatory system: Secondary | ICD-10-CM | POA: Diagnosis not present

## 2019-04-07 DIAGNOSIS — Z87891 Personal history of nicotine dependence: Secondary | ICD-10-CM | POA: Diagnosis not present

## 2019-04-07 DIAGNOSIS — Z951 Presence of aortocoronary bypass graft: Secondary | ICD-10-CM | POA: Diagnosis not present

## 2019-04-07 DIAGNOSIS — I351 Nonrheumatic aortic (valve) insufficiency: Secondary | ICD-10-CM | POA: Diagnosis not present

## 2019-04-19 DIAGNOSIS — M25511 Pain in right shoulder: Secondary | ICD-10-CM | POA: Diagnosis not present

## 2019-04-19 DIAGNOSIS — M25571 Pain in right ankle and joints of right foot: Secondary | ICD-10-CM | POA: Diagnosis not present

## 2019-04-22 ENCOUNTER — Telehealth: Payer: Self-pay | Admitting: Interventional Cardiology

## 2019-04-22 ENCOUNTER — Emergency Department (HOSPITAL_BASED_OUTPATIENT_CLINIC_OR_DEPARTMENT_OTHER): Payer: Medicare Other

## 2019-04-22 ENCOUNTER — Other Ambulatory Visit: Payer: Self-pay

## 2019-04-22 ENCOUNTER — Inpatient Hospital Stay (HOSPITAL_COMMUNITY)
Admission: EM | Admit: 2019-04-22 | Discharge: 2019-04-24 | DRG: 292 | Disposition: A | Discharge status: Home / Self Care | Payer: Medicare Other | Attending: Internal Medicine | Admitting: Internal Medicine

## 2019-04-22 ENCOUNTER — Emergency Department (HOSPITAL_COMMUNITY): Payer: Medicare Other

## 2019-04-22 ENCOUNTER — Encounter (HOSPITAL_COMMUNITY): Payer: Self-pay | Admitting: Pharmacy Technician

## 2019-04-22 DIAGNOSIS — S82891D Other fracture of right lower leg, subsequent encounter for closed fracture with routine healing: Secondary | ICD-10-CM

## 2019-04-22 DIAGNOSIS — K219 Gastro-esophageal reflux disease without esophagitis: Secondary | ICD-10-CM | POA: Diagnosis present

## 2019-04-22 DIAGNOSIS — I110 Hypertensive heart disease with heart failure: Principal | ICD-10-CM | POA: Diagnosis present

## 2019-04-22 DIAGNOSIS — Z7982 Long term (current) use of aspirin: Secondary | ICD-10-CM

## 2019-04-22 DIAGNOSIS — I350 Nonrheumatic aortic (valve) stenosis: Secondary | ICD-10-CM

## 2019-04-22 DIAGNOSIS — Z886 Allergy status to analgesic agent status: Secondary | ICD-10-CM

## 2019-04-22 DIAGNOSIS — N179 Acute kidney failure, unspecified: Secondary | ICD-10-CM | POA: Diagnosis not present

## 2019-04-22 DIAGNOSIS — D696 Thrombocytopenia, unspecified: Secondary | ICD-10-CM | POA: Diagnosis not present

## 2019-04-22 DIAGNOSIS — Z87891 Personal history of nicotine dependence: Secondary | ICD-10-CM

## 2019-04-22 DIAGNOSIS — Z8249 Family history of ischemic heart disease and other diseases of the circulatory system: Secondary | ICD-10-CM

## 2019-04-22 DIAGNOSIS — G4733 Obstructive sleep apnea (adult) (pediatric): Secondary | ICD-10-CM | POA: Diagnosis present

## 2019-04-22 DIAGNOSIS — Z952 Presence of prosthetic heart valve: Secondary | ICD-10-CM

## 2019-04-22 DIAGNOSIS — E785 Hyperlipidemia, unspecified: Secondary | ICD-10-CM | POA: Diagnosis present

## 2019-04-22 DIAGNOSIS — I5033 Acute on chronic diastolic (congestive) heart failure: Secondary | ICD-10-CM

## 2019-04-22 DIAGNOSIS — Z79890 Hormone replacement therapy (postmenopausal): Secondary | ICD-10-CM

## 2019-04-22 DIAGNOSIS — I4510 Unspecified right bundle-branch block: Secondary | ICD-10-CM | POA: Diagnosis not present

## 2019-04-22 DIAGNOSIS — I509 Heart failure, unspecified: Secondary | ICD-10-CM

## 2019-04-22 DIAGNOSIS — R609 Edema, unspecified: Secondary | ICD-10-CM | POA: Diagnosis not present

## 2019-04-22 DIAGNOSIS — I959 Hypotension, unspecified: Secondary | ICD-10-CM | POA: Diagnosis not present

## 2019-04-22 DIAGNOSIS — X58XXXD Exposure to other specified factors, subsequent encounter: Secondary | ICD-10-CM | POA: Diagnosis present

## 2019-04-22 DIAGNOSIS — I491 Atrial premature depolarization: Secondary | ICD-10-CM | POA: Diagnosis not present

## 2019-04-22 DIAGNOSIS — Z951 Presence of aortocoronary bypass graft: Secondary | ICD-10-CM

## 2019-04-22 DIAGNOSIS — Z79899 Other long term (current) drug therapy: Secondary | ICD-10-CM

## 2019-04-22 DIAGNOSIS — Z20828 Contact with and (suspected) exposure to other viral communicable diseases: Secondary | ICD-10-CM | POA: Diagnosis not present

## 2019-04-22 DIAGNOSIS — I779 Disorder of arteries and arterioles, unspecified: Secondary | ICD-10-CM | POA: Diagnosis present

## 2019-04-22 DIAGNOSIS — I2510 Atherosclerotic heart disease of native coronary artery without angina pectoris: Secondary | ICD-10-CM | POA: Diagnosis present

## 2019-04-22 DIAGNOSIS — Z209 Contact with and (suspected) exposure to unspecified communicable disease: Secondary | ICD-10-CM | POA: Diagnosis not present

## 2019-04-22 DIAGNOSIS — I739 Peripheral vascular disease, unspecified: Secondary | ICD-10-CM | POA: Diagnosis present

## 2019-04-22 DIAGNOSIS — M109 Gout, unspecified: Secondary | ICD-10-CM | POA: Diagnosis present

## 2019-04-22 DIAGNOSIS — R0602 Shortness of breath: Secondary | ICD-10-CM | POA: Diagnosis not present

## 2019-04-22 DIAGNOSIS — Z6835 Body mass index (BMI) 35.0-35.9, adult: Secondary | ICD-10-CM

## 2019-04-22 DIAGNOSIS — E669 Obesity, unspecified: Secondary | ICD-10-CM | POA: Diagnosis present

## 2019-04-22 DIAGNOSIS — I10 Essential (primary) hypertension: Secondary | ICD-10-CM | POA: Diagnosis present

## 2019-04-22 LAB — CBC
HCT: 37.4 % — ABNORMAL LOW (ref 39.0–52.0)
Hemoglobin: 12.3 g/dL — ABNORMAL LOW (ref 13.0–17.0)
MCH: 31.2 pg (ref 26.0–34.0)
MCHC: 32.9 g/dL (ref 30.0–36.0)
MCV: 94.9 fL (ref 80.0–100.0)
Platelets: 90 10*3/uL — ABNORMAL LOW (ref 150–400)
RBC: 3.94 MIL/uL — ABNORMAL LOW (ref 4.22–5.81)
RDW: 13.6 % (ref 11.5–15.5)
WBC: 8.9 10*3/uL (ref 4.0–10.5)
nRBC: 0 % (ref 0.0–0.2)

## 2019-04-22 LAB — COMPREHENSIVE METABOLIC PANEL
ALT: 20 U/L (ref 0–44)
AST: 24 U/L (ref 15–41)
Albumin: 3.8 g/dL (ref 3.5–5.0)
Alkaline Phosphatase: 66 U/L (ref 38–126)
Anion gap: 11 (ref 5–15)
BUN: 37 mg/dL — ABNORMAL HIGH (ref 8–23)
CO2: 19 mmol/L — ABNORMAL LOW (ref 22–32)
Calcium: 9.2 mg/dL (ref 8.9–10.3)
Chloride: 106 mmol/L (ref 98–111)
Creatinine, Ser: 1.42 mg/dL — ABNORMAL HIGH (ref 0.61–1.24)
GFR calc Af Amer: 54 mL/min — ABNORMAL LOW (ref >60)
GFR calc non Af Amer: 46 mL/min — ABNORMAL LOW (ref >60)
Glucose, Bld: 120 mg/dL — ABNORMAL HIGH (ref 70–99)
Potassium: 4.1 mmol/L (ref 3.5–5.1)
Sodium: 136 mmol/L (ref 135–145)
Total Bilirubin: 1.7 mg/dL — ABNORMAL HIGH (ref 0.3–1.2)
Total Protein: 6.1 g/dL — ABNORMAL LOW (ref 6.5–8.1)

## 2019-04-22 LAB — TROPONIN I
Troponin I: 0.04 ng/mL (ref <0.03)
Troponin I: 0.05 ng/mL (ref <0.03)

## 2019-04-22 LAB — SARS CORONAVIRUS 2 BY RT PCR (HOSPITAL ORDER, PERFORMED IN ~~LOC~~ HOSPITAL LAB): SARS Coronavirus 2: NEGATIVE

## 2019-04-22 LAB — T4, FREE: Free T4: 1.22 ng/dL (ref 0.82–1.77)

## 2019-04-22 LAB — HEMOGLOBIN A1C
Hgb A1c MFr Bld: 5.4 % (ref 4.8–5.6)
Mean Plasma Glucose: 108.28 mg/dL

## 2019-04-22 LAB — BRAIN NATRIURETIC PEPTIDE: B Natriuretic Peptide: 813.2 pg/mL — ABNORMAL HIGH (ref 0.0–100.0)

## 2019-04-22 LAB — TSH: TSH: 6.407 u[IU]/mL — ABNORMAL HIGH (ref 0.350–4.500)

## 2019-04-22 MED ORDER — SODIUM CHLORIDE 0.9% FLUSH
3.0000 mL | Freq: Two times a day (BID) | INTRAVENOUS | Status: DC
  Administered 2019-04-22 – 2019-04-23 (×3): 3 mL via INTRAVENOUS

## 2019-04-22 MED ORDER — FAMOTIDINE 20 MG PO TABS
20.0000 mg | ORAL_TABLET | Freq: Every day | ORAL | Status: DC
  Administered 2019-04-22 – 2019-04-24 (×3): 20 mg via ORAL
  Filled 2019-04-22 (×3): qty 1

## 2019-04-22 MED ORDER — TAMSULOSIN HCL 0.4 MG PO CAPS
0.4000 mg | ORAL_CAPSULE | Freq: Every evening | ORAL | Status: DC
  Administered 2019-04-22 – 2019-04-23 (×2): 0.4 mg via ORAL
  Filled 2019-04-22 (×2): qty 1

## 2019-04-22 MED ORDER — METOPROLOL TARTRATE 50 MG PO TABS
50.0000 mg | ORAL_TABLET | Freq: Two times a day (BID) | ORAL | Status: DC
  Administered 2019-04-23: 50 mg via ORAL
  Filled 2019-04-22 (×2): qty 1

## 2019-04-22 MED ORDER — AMLODIPINE BESYLATE 10 MG PO TABS
10.0000 mg | ORAL_TABLET | Freq: Every day | ORAL | Status: DC
  Administered 2019-04-22 – 2019-04-24 (×3): 10 mg via ORAL
  Filled 2019-04-22 (×3): qty 1

## 2019-04-22 MED ORDER — ALLOPURINOL 300 MG PO TABS
300.0000 mg | ORAL_TABLET | Freq: Every day | ORAL | Status: DC
  Administered 2019-04-23 – 2019-04-24 (×2): 300 mg via ORAL
  Filled 2019-04-22 (×2): qty 1

## 2019-04-22 MED ORDER — SPIRONOLACTONE 25 MG PO TABS
25.0000 mg | ORAL_TABLET | Freq: Every evening | ORAL | Status: DC
  Administered 2019-04-22: 25 mg via ORAL
  Filled 2019-04-22: qty 1

## 2019-04-22 MED ORDER — ASPIRIN EC 81 MG PO TBEC
81.0000 mg | DELAYED_RELEASE_TABLET | Freq: Every day | ORAL | Status: DC
  Administered 2019-04-22 – 2019-04-23 (×2): 81 mg via ORAL
  Filled 2019-04-22 (×2): qty 1

## 2019-04-22 MED ORDER — SODIUM CHLORIDE 0.9 % IV SOLN: 250.0000 mL | INTRAVENOUS | Status: DC | PRN

## 2019-04-22 MED ORDER — KETOCONAZOLE 2 % EX CREA
1.0000 "application " | TOPICAL_CREAM | Freq: Every day | CUTANEOUS | Status: DC | PRN
  Filled 2019-04-22: qty 15

## 2019-04-22 MED ORDER — RAMIPRIL 10 MG PO CAPS
10.0000 mg | ORAL_CAPSULE | Freq: Every day | ORAL | Status: DC
  Administered 2019-04-23: 10 mg via ORAL
  Filled 2019-04-22 (×2): qty 1
  Filled 2019-04-22 (×2): qty 4

## 2019-04-22 MED ORDER — TEMAZEPAM 15 MG PO CAPS: 30.0000 mg | ORAL_CAPSULE | Freq: Every evening | ORAL | Status: DC | PRN

## 2019-04-22 MED ORDER — OXYCODONE-ACETAMINOPHEN 5-325 MG PO TABS: 1.0000 | ORAL_TABLET | ORAL | Status: DC | PRN

## 2019-04-22 MED ORDER — HEPARIN SODIUM (PORCINE) 5000 UNIT/ML IJ SOLN
5000.0000 [IU] | Freq: Three times a day (TID) | INTRAMUSCULAR | Status: DC
  Administered 2019-04-22: 5000 [IU] via SUBCUTANEOUS
  Filled 2019-04-22: qty 1

## 2019-04-22 MED ORDER — ONDANSETRON HCL 4 MG/2ML IJ SOLN: 4.0000 mg | Freq: Four times a day (QID) | INTRAMUSCULAR | Status: DC | PRN

## 2019-04-22 MED ORDER — FUROSEMIDE 10 MG/ML IJ SOLN
40.0000 mg | Freq: Two times a day (BID) | INTRAMUSCULAR | Status: DC
  Administered 2019-04-23: 40 mg via INTRAVENOUS
  Filled 2019-04-22: qty 4

## 2019-04-22 MED ORDER — ACETAMINOPHEN 325 MG PO TABS: 650.0000 mg | ORAL_TABLET | ORAL | Status: DC | PRN

## 2019-04-22 MED ORDER — FUROSEMIDE 10 MG/ML IJ SOLN
40.0000 mg | Freq: Once | INTRAMUSCULAR | Status: AC
  Administered 2019-04-22: 40 mg via INTRAVENOUS
  Filled 2019-04-22: qty 4

## 2019-04-22 MED ORDER — SODIUM CHLORIDE 0.9% FLUSH: 3.0000 mL | INTRAVENOUS | Status: DC | PRN

## 2019-04-22 MED ORDER — ROSUVASTATIN CALCIUM 5 MG PO TABS
10.0000 mg | ORAL_TABLET | Freq: Every evening | ORAL | Status: DC
  Administered 2019-04-22 – 2019-04-23 (×2): 10 mg via ORAL
  Filled 2019-04-22 (×2): qty 2

## 2019-04-22 NOTE — ED Notes (Signed)
Patient transported to X-ray 

## 2019-04-22 NOTE — ED Notes (Signed)
Per Vascular, pt negative for DVT bilaterally

## 2019-04-22 NOTE — Progress Notes (Signed)
Lower extremity venous has been completed.   Preliminary results in CV Proc.   Abram Sander 04/22/2019 3:12 PM

## 2019-04-22 NOTE — Telephone Encounter (Signed)
Wife called and wanted Dr.Smith to know that the patient almost collapsed on the way back from the mailbox, and has been taken by ambulance to [facility].  The wife was wondering if Dr. Tamala Julian had the ability to check in on him, because she can not go visit him due to [place].

## 2019-04-22 NOTE — H&P (Signed)
[facility] History and Physical   Patient: Andre Miranda EUM:[id number]   PCP: Crist Infante, MD DOB: [birth year]   DOA: 04/22/2019   DOS: 04/22/2019   DOS: the patient was seen and examined on 04/22/2019  Patient coming from: The patient is coming from Home  Chief Complaint: Shortness of breath  HPI: Andre Miranda is a 83 y.o. male with Past medical history of CAD S/P CABG, aortic stenosis S/P TAVR, PVD, chronic diastolic CHF, morbid obesity, OSA, recent ankle fracture. Patient presents with complaints of shortness of breath progressively worsening over last few weeks.  He also reports bilateral leg swelling ongoing for last few weeks.  No chest pain no chest heaviness no chest tightness reported.  No dizziness no lightheadedness reported. Patient also reports orthopnea and PND ongoing for last few days. No fever no chills no cough. Patient recently had ankle fracture and was wearing a cast on her right ankle.  Patient was using Lovenox injection up until 2 weeks ago he stopped using it.  Currently patient is in brace. Denies any similar episode in the past.  No other acute change in medications reported by patient.  ED Course: BNP was elevated.  Patient was given 40 mg IV Lasix and was referred for admission.  At his baseline ambulates without support And is independent for most of his ADL; manages his medication on his own.  Review of Systems: as mentioned in the history of present illness.  All other systems reviewed and are negative.  Past Medical History:  Diagnosis Date   Aortic stenosis    MODERATE   Cancer ([place])    prostate cancer-radiation   Carotid arterial disease ([place])    Coronary artery disease    Dysuria    GERD (gastroesophageal reflux disease)    Gout    Hematuria    History of urinary retention    Hyperlipidemia    Hypertension    Obesity    Sleep apnea    uses CPAP nightly   Past Surgical History:  Procedure Laterality Date    AORTIC VALVE REPAIR  February 06, 2015   Dr. Aline Brochure and Dr. Ysidro Evert  at [place]  02/2008   CHOLECYSTECTOMY  04/2002   CORONARY ARTERY BYPASS GRAFT  2003   LIMA TO THE LAD, RIMA TO THE RCA, AND A SAPHENOUS VEIN GRAFT TO THE INTERMEDIATE AND DISTAL LEFT CIRCUMFLEX   ORIF ANKLE FRACTURE Right 02/14/2019   Procedure: OPEN REDUCTION INTERNAL FIXATION (ORIF) ANKLE FRACTURE;  Surgeon: Netta Cedars, MD;  Location: [facility];  Service: Orthopedics;  Laterality: Right;   US ECHOCARDIOGRAPHY  08/2009   SHOWED A MEAN GRADIENT ACROSS IS AORTIC VALVE OF 24 MM OF MERCURY. HE HAD MODERATE LVH AND NORMAL LV FUNCTION   Social History:  reports that he quit smoking about 48 years ago. His smoking use included cigarettes. He has a 10.50 pack-year smoking history. He has never used smokeless tobacco. He reports current alcohol use. He reports that he does not use drugs.  Allergies  Allergen Reactions   Ibuprofen Hypertension   Family History  Problem Relation Age of Onset   Heart attack Father    Pneumonia Mother 37     Prior to Admission medications   Medication Sig Start Date End Date Taking? Authorizing Provider  allopurinol (ZYLOPRIM) 300 MG tablet Take 300 mg by mouth daily.     Yes [provider]  amLODipine (NORVASC) 10 MG tablet TAKE 1  TABLET BY MOUTH ONCE DAILY Patient taking differently: Take 10 mg by mouth daily.  12/29/18  Yes Belva Crome, MD  aspirin EC 81 MG tablet Take 81 mg by mouth at bedtime.  11/23/12  Yes Larey Dresser, MD  Cholecalciferol (VITAMIN D3) 1000 UNITS CAPS Take 1 capsule by mouth at bedtime.    Yes [provider]  famotidine (PEPCID) 20 MG tablet Take 20 mg by mouth daily.     Yes [provider]  hydrochlorothiazide 25 MG tablet Take 25 mg by mouth daily.     Yes [provider]  ketoconazole (NIZORAL) 2 % cream Apply 1 application topically daily as needed for irritation. 02/04/17   Yes [provider]  Melatonin 3 MG TABS Take 3 mg by mouth at bedtime as needed (sleep).   Yes [provider]  metoprolol (LOPRESSOR) 50 MG tablet Take 50 mg by mouth 2 (two) times daily.     Yes [provider]  Multiple Vitamins-Minerals (CENTRUM SILVER) tablet Take 1 tablet by mouth daily.     Yes [provider]  oxyCODONE-acetaminophen (PERCOCET/ROXICET) 5-325 MG tablet Take 1 tablet by mouth every 4 (four) hours as needed for moderate pain or severe pain. 02/14/19  Yes Netta Cedars, MD  phenazopyridine (PYRIDIUM) 95 MG tablet Take 95 mg by mouth 3 (three) times daily as needed for pain (UTI symptoms).   Yes [provider]  ramipril (ALTACE) 10 MG capsule Take 1 capsule (10 mg total) by mouth daily. 12/20/18 12/15/19 Yes Belva Crome, MD  rosuvastatin (CRESTOR) 20 MG tablet Take 10 mg by mouth every evening.   Yes [provider]  Sennosides (SENOKOT PO) Take 1 tablet by mouth daily as needed (constipation). FOR CONSTIPATION    Yes [provider]  spironolactone (ALDACTONE) 25 MG tablet TAKE 1 TABLET BY MOUTH ONCE DAILY. PLEASE KEEP UPCOMING APPT IN FEB FOR FUTURE REFILLS Patient taking differently: Take 25 mg by mouth every evening.  01/24/19  Yes Belva Crome, MD  Tamsulosin HCl (FLOMAX) 0.4 MG CAPS Take 0.4 mg by mouth every evening.    Yes [provider]  temazepam (RESTORIL) 30 MG capsule Take 30 mg by mouth at bedtime as needed for sleep.   Yes [provider]  clindamycin (CLEOCIN) 150 MG capsule Take 150 mg by mouth as needed. 4 Capsules 72mins prior to dental appt  (600mg )    [provider]    Physical Exam: Vitals:   04/22/19 1700 04/22/19 1830 04/22/19 1915 04/22/19 2009  BP: (!) 142/46 (!) 133/52 (!) 123/51 (!) 141/51  Pulse:    (!) 56  Resp: 17 11 13 18   Temp:    98 F (36.7 C)  TempSrc:    Oral  SpO2:    97%  Weight:    113.6 kg  Height:    5\' 10"  (1.778 m)    General:  Alert, Awake and Oriented to Time, Place and Person. Appear in mild distress, affect appropriate Eyes: PERRL, Conjunctiva normal ENT: Oral Mucosa clear moist. Neck: Positive JVD, no Abnormal Mass Or lumps Cardiovascular: S1 and S2 Present, no Murmur, Peripheral Pulses Present Respiratory: normal respiratory effort, Bilateral Air entry equal and Decreased, no use of accessory muscle, bilateral  Crackles, no wheezes Abdomen: Bowel Sound present, Soft and no tenderness, no hernia Skin: no redness, no Rash, no induration Extremities: bilateral Pedal edema, no calf tenderness Neurologic: Grossly no focal neuro deficit. Bilaterally Equal motor strength  Labs on  Admission:  CBC: Recent Labs  Lab 04/22/19 1405  WBC 8.9  HGB 12.3*  HCT 37.4*  MCV 94.9  PLT 90*   Basic Metabolic Panel: Recent Labs  Lab 04/22/19 1405  NA 136  K 4.1  CL 106  CO2 19*  GLUCOSE 120*  BUN 37*  CREATININE 1.42*  CALCIUM 9.2   GFR: Estimated Creatinine Clearance: 52.3 mL/min (A) (by C-G formula based on SCr of 1.42 mg/dL (H)). Liver Function Tests: Recent Labs  Lab 04/22/19 1405  AST 24  ALT 20  ALKPHOS 66  BILITOT 1.7*  PROT 6.1*  ALBUMIN 3.8   No results for input(s): LIPASE, AMYLASE in the last 168 hours. No results for input(s): AMMONIA in the last 168 hours. Coagulation Profile: No results for input(s): INR, PROTIME in the last 168 hours. Cardiac Enzymes: Recent Labs  Lab 04/22/19 1405  TROPONINI 0.04*   BNP (last 3 results) No results for input(s): PROBNP in the last 8760 hours. HbA1C: No results for input(s): HGBA1C in the last 72 hours. CBG: No results for input(s): GLUCAP in the last 168 hours. Lipid Profile: No results for input(s): CHOL, HDL, LDLCALC, TRIG, CHOLHDL, LDLDIRECT in the last 72 hours. Thyroid Function Tests: No results for input(s): TSH, T4TOTAL, FREET4, T3FREE, THYROIDAB in the last 72 hours. Anemia Panel: No results for input(s): VITAMINB12, FOLATE,  FERRITIN, TIBC, IRON, RETICCTPCT in the last 72 hours. Urine analysis:    Component Value Date/Time   COLORURINE RED BIOCHEMICALS MAY BE AFFECTED BY COLOR (A) 04/21/2009 0212   APPEARANCEUR CLOUDY (A) 04/21/2009 0212   LABSPEC 1.014 04/21/2009 0212   PHURINE 6.0 04/21/2009 0212   GLUCOSEU NEGATIVE 04/21/2009 0212   HGBUR LARGE (A) 04/21/2009 0212   BILIRUBINUR NEGATIVE 04/21/2009 0212   KETONESUR NEGATIVE 04/21/2009 0212   PROTEINUR 100 (A) 04/21/2009 0212   UROBILINOGEN 0.2 04/21/2009 0212   NITRITE NEGATIVE 04/21/2009 0212   LEUKOCYTESUR MODERATE (A) 04/21/2009 0212    Radiological Exams on Admission: Dg Chest 2 View  Result Date: 04/22/2019 CLINICAL DATA:  Exertional shortness of breath for 3 days. EXAM: CHEST - 2 VIEW COMPARISON:  PA and lateral chest 09/28/2014. FINDINGS: The patient is status post CABG. Heart size is upper normal. There is interstitial pulmonary edema with associated small bilateral pleural effusions. No consolidative process or pneumothorax. IMPRESSION: Interstitial pulmonary edema with associated small pleural effusions. Electronically Signed   By: Inge Rise M.D.   On: 04/22/2019 15:00   Vas [place] Lower Extremity Venous (dvt) (only [facility])  Result Date: 04/22/2019  Lower Venous Study Indications: Edema.  Performing Technologist: Abram Sander RVS  Examination Guidelines: A complete evaluation includes B-mode imaging, spectral Doppler, color Doppler, and power Doppler as needed of all accessible portions of each vessel. Bilateral testing is considered an integral part of a complete examination. Limited examinations for reoccurring indications may be performed as noted.  +---------+---------------+---------+-----------+----------+-------+  RIGHT     Compressibility Phasicity Spontaneity Properties Summary  +---------+---------------+---------+-----------+----------+-------+  CFV       Full            Yes       Yes                              +---------+---------------+---------+-----------+----------+-------+  SFJ       Full                                                      +---------+---------------+---------+-----------+----------+-------+  FV Prox   Full                                                      +---------+---------------+---------+-----------+----------+-------+  FV Mid    Full                                                      +---------+---------------+---------+-----------+----------+-------+  FV Distal Full                                                      +---------+---------------+---------+-----------+----------+-------+  PFV       Full                                                      +---------+---------------+---------+-----------+----------+-------+  POP       Full            Yes       Yes                             +---------+---------------+---------+-----------+----------+-------+  PTV       Full                                                      +---------+---------------+---------+-----------+----------+-------+  PERO      Full                                                      +---------+---------------+---------+-----------+----------+-------+   +---------+---------------+---------+-----------+----------+-------+  LEFT      Compressibility Phasicity Spontaneity Properties Summary  +---------+---------------+---------+-----------+----------+-------+  CFV       Full            Yes       Yes                             +---------+---------------+---------+-----------+----------+-------+  SFJ       Full                                                      +---------+---------------+---------+-----------+----------+-------+  FV Prox   Full                                                      +---------+---------------+---------+-----------+----------+-------+    FV Mid    Full                                                      +---------+---------------+---------+-----------+----------+-------+  FV Distal Full                                                       +---------+---------------+---------+-----------+----------+-------+  PFV       Full                                                      +---------+---------------+---------+-----------+----------+-------+  POP       Full            Yes       Yes                             +---------+---------------+---------+-----------+----------+-------+  PTV       Full                                                      +---------+---------------+---------+-----------+----------+-------+  PERO      Full                                                      +---------+---------------+---------+-----------+----------+-------+     Summary: Right: There is no evidence of deep vein thrombosis in the lower extremity. No cystic structure found in the popliteal fossa. Left: There is no evidence of deep vein thrombosis in the lower extremity. No cystic structure found in the popliteal fossa.  *See table(s) above for measurements and observations. Electronically signed by Servando Snare MD on 04/22/2019 at 4:26:00 PM.    Final    EKG: Independently reviewed. normal sinus rhythm, nonspecific ST and T waves changes.  Assessment/Plan 1. Acute on chronic diastolic CHF (congestive heart failure) (HCC) Suspecting acute on chronic diastolic CHF. Follow-up on echocardiogram. Serial troponins. Continue with IV Lasix 40 mg twice daily. Already urinated 1 L after 40 mg IV Lasix x1. Given significant volume overload suspect that he will require continue diuresis. Likely currently on room air.  2.  Essential hypertension. We will continue home regimen.  3.  OSA. Morbid obesity. Not on CPAP. Monitor.  4.  CAD Peripheral vascular disease Dyslipidemia. Continue home regimen.  5.  Mild AKI. Likely cardiorenal hemodynamics. Monitor renal function while patient is achieving diuresis.  Nutrition: Cardiac diet DVT Prophylaxis: Subcutaneous Heparin   Advance goals of care  discussion: Full code  Consults: none  Family Communication: no family was present at bedside, at the time of interview.  Disposition: Admitted as observation, telemetry unit. Likely to be discharged home,  in 1-2 days   Severity of Illness: The appropriate patient status for this patient is OBSERVATION. Observation status is judged to be reasonable and necessary in order to provide the required intensity of service to ensure the patient's safety. The patient's presenting symptoms, physical exam findings, and initial radiographic and laboratory data in the context of their medical condition is felt to place them at decreased risk for further clinical deterioration. Furthermore, it is anticipated that the patient will be medically stable for discharge from the hospital within 2 midnights of admission. The following factors support the patient status of observation.   " The patient's presenting symptoms include shortness of breath orthopnea PND. " The physical exam findings include mild edema, bilateral basal crackles up to mid lung base back. " The initial radiographic and laboratory data are elevated BNP, elevated serum creatinine.     Author: Berle Mull, MD [facility] 04/22/2019  To reach On-call, see care teams to locate the attending and reach out to them via [email or web address]. If 7PM-7AM, please contact night-coverage If you still have difficulty reaching the attending provider, please page the [facility] (Director on Call) for [facility] on amion for assistance.

## 2019-04-22 NOTE — ED Triage Notes (Signed)
Pt arrives via ems from home with exertional sob X3 days. Pt also with BLE edema. 118/46, hr 60, rr18, 96% ra. Pt in NAD.

## 2019-04-22 NOTE — ED Provider Notes (Signed)
[facility] EMERGENCY DEPARTMENT Provider Note   CSN: [id number] Arrival date & time: 04/22/19  1329    History   Chief Complaint Chief Complaint  Patient presents with  . Shortness of Breath    HPI Andre Miranda is a 83 y.o. male.     HPI Patient is an 83 year old male presents the emergency department with complaints of worsening exertional shortness of breath.  He is status post ORIF of his right ankle which he sustained a fracture in approximately 1 month ago.  He has been doing well but over the last several days noticed increasing lower extremity edema bilaterally and increased exertional shortness of breath when walking to the mailbox.  Today he had an episode where he became extremely short of breath and diaphoretic and presyncopal without palpitations after walking to the mailbox and back.  He states this is abnormal for him.  No history of DVT or pulmonary embolism.  He states he was on Lovenox injections postoperatively.  He is currently not on anticoagulation.  No recent chest pain or chest tightness.  Denies a history of congestive heart failure.  No recent fevers or chills.  No cough.  History of aortic stenosis status post TAVR   Past Medical History:  Diagnosis Date  . Aortic stenosis    MODERATE  . Cancer [facility])    prostate cancer-radiation  . Carotid arterial disease ([place])   . Coronary artery disease   . Dysuria   . GERD (gastroesophageal reflux disease)   . Gout   . Hematuria   . History of urinary retention   . Hyperlipidemia   . Hypertension   . Obesity   . Sleep apnea    uses CPAP nightly    Patient Active Problem List   Diagnosis Date Noted  . 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 Accelerated hypertension 07/27/2017  . Right-sided chest wall pain 09/28/2014  . Occlusion and stenosis of carotid artery without mention of cerebral infarction 03/28/2014  . Acute bronchitis with asthma 08/25/2012  .  Multiple lung nodules 06/20/2012  . Hyperlipidemia 10/02/2011  . CAD (coronary artery disease) 04/04/2011  . Hypertension   . Obesity   . Dysuria   . Hematuria   . Aortic stenosis   . Obstructive sleep apnea   . Gout   . Carotid arterial disease ([place])     Past Surgical History:  Procedure Laterality Date  . AORTIC VALVE REPAIR  February 06, 2015   Dr. Aline Brochure and Dr. Ysidro Evert  at [facility]  . [place]  02/2008  . CHOLECYSTECTOMY  04/2002  . CORONARY ARTERY BYPASS GRAFT  2003   LIMA TO THE LAD, RIMA TO THE RCA, AND A SAPHENOUS VEIN GRAFT TO THE INTERMEDIATE AND DISTAL LEFT CIRCUMFLEX  . ORIF ANKLE FRACTURE Right 02/14/2019   Procedure: OPEN REDUCTION INTERNAL FIXATION (ORIF) ANKLE FRACTURE;  Surgeon: Netta Cedars, MD;  Location: [facility];  Service: Orthopedics;  Laterality: Right;  . US ECHOCARDIOGRAPHY  08/2009   SHOWED A MEAN GRADIENT ACROSS IS AORTIC VALVE OF 24 MM OF MERCURY. HE HAD MODERATE LVH AND NORMAL LV FUNCTION        Home Medications    Prior to Admission medications   Medication Sig Start Date End Date Taking? Authorizing Provider  allopurinol (ZYLOPRIM) 300 MG tablet Take 300 mg by mouth daily.     Yes [provider]  amLODipine (NORVASC) 10 MG tablet TAKE 1 TABLET BY MOUTH  ONCE DAILY Patient taking differently: Take 10 mg by mouth daily.  12/29/18  Yes Belva Crome, MD  aspirin EC 81 MG tablet Take 81 mg by mouth at bedtime.  11/23/12  Yes Larey Dresser, MD  Cholecalciferol (VITAMIN D3) 1000 UNITS CAPS Take 1 capsule by mouth at bedtime.    Yes [provider]  famotidine (PEPCID) 20 MG tablet Take 20 mg by mouth daily.     Yes [provider]  hydrochlorothiazide 25 MG tablet Take 25 mg by mouth daily.     Yes [provider]  ketoconazole (NIZORAL) 2 % cream Apply 1 application topically daily as needed for irritation. 02/04/17  Yes [provider]  Melatonin 3 MG TABS Take 3 mg by  mouth at bedtime as needed (sleep).   Yes [provider]  metoprolol (LOPRESSOR) 50 MG tablet Take 50 mg by mouth 2 (two) times daily.     Yes [provider]  Multiple Vitamins-Minerals (CENTRUM SILVER) tablet Take 1 tablet by mouth daily.     Yes [provider]  oxyCODONE-acetaminophen (PERCOCET/ROXICET) 5-325 MG tablet Take 1 tablet by mouth every 4 (four) hours as needed for moderate pain or severe pain. 02/14/19  Yes Netta Cedars, MD  phenazopyridine (PYRIDIUM) 95 MG tablet Take 95 mg by mouth 3 (three) times daily as needed for pain (UTI symptoms).   Yes [provider]  ramipril (ALTACE) 10 MG capsule Take 1 capsule (10 mg total) by mouth daily. 12/20/18 12/15/19 Yes Belva Crome, MD  rosuvastatin (CRESTOR) 20 MG tablet Take 10 mg by mouth every evening.   Yes [provider]  Sennosides (SENOKOT PO) Take 1 tablet by mouth daily as needed (constipation). FOR CONSTIPATION    Yes [provider]  spironolactone (ALDACTONE) 25 MG tablet TAKE 1 TABLET BY MOUTH ONCE DAILY. PLEASE KEEP UPCOMING APPT IN FEB FOR FUTURE REFILLS Patient taking differently: Take 25 mg by mouth every evening.  01/24/19  Yes Belva Crome, MD  Tamsulosin HCl (FLOMAX) 0.4 MG CAPS Take 0.4 mg by mouth every evening.    Yes [provider]  temazepam (RESTORIL) 30 MG capsule Take 30 mg by mouth at bedtime as needed for sleep.   Yes [provider]  clindamycin (CLEOCIN) 150 MG capsule Take 150 mg by mouth as needed. 4 Capsules 74mins prior to dental appt  (600mg )    [provider]    Family History Family History  Problem Relation Age of Onset  . Heart attack Father   . Pneumonia Mother 52    Social History Social History   Tobacco Use  . Smoking status: Former Smoker    Packs/day: 1.50    Years: 7.00    Pack years: 10.50    Types: Cigarettes    Last attempt to quit: 04/03/1971    Years since quitting: 48.0  . Smokeless tobacco:  Never Used  Substance Use Topics  . Alcohol use: Yes    Comment: social  . Drug use: No     Allergies   Ibuprofen   Review of Systems Review of Systems  All other systems reviewed and are negative.    Physical Exam Updated Vital Signs BP (!) 131/46   Pulse (!) 58   Temp 98.1 F (36.7 C) (Oral)   Resp 15   Ht 5\' 10"  (1.778 m)   Wt 112.6 kg   SpO2 95%   BMI 35.62 kg/m   Physical Exam Vitals signs and  nursing note reviewed.  Constitutional:      Appearance: He is well-developed.  HENT:     Head: Normocephalic and atraumatic.  Neck:     Musculoskeletal: Normal range of motion.  Cardiovascular:     Rate and Rhythm: Normal rate and regular rhythm.     Heart sounds: Normal heart sounds.  Pulmonary:     Effort: Pulmonary effort is normal. No respiratory distress.     Breath sounds: Normal breath sounds.  Abdominal:     General: There is no distension.     Palpations: Abdomen is soft.     Tenderness: There is no abdominal tenderness.  Musculoskeletal: Normal range of motion.     Right lower leg: Edema present.     Left lower leg: Edema present.  Skin:    General: Skin is warm and dry.  Neurological:     Mental Status: He is alert and oriented to person, place, and time.  Psychiatric:        Judgment: Judgment normal.      ED Treatments / Results  Labs (all labs ordered are listed, but only abnormal results are displayed) Labs Reviewed  CBC - Abnormal; Notable for the following components:      Result Value   RBC 3.94 (*)    Hemoglobin 12.3 (*)    HCT 37.4 (*)    Platelets 90 (*)    All other components within normal limits  COMPREHENSIVE METABOLIC PANEL - Abnormal; Notable for the following components:   CO2 19 (*)    Glucose, Bld 120 (*)    BUN 37 (*)    Creatinine, Ser 1.42 (*)    Total Protein 6.1 (*)    Total Bilirubin 1.7 (*)    GFR calc non Af Amer 46 (*)    GFR calc Af Amer 54 (*)    All other components within normal limits  TROPONIN I  - Abnormal; Notable for the following components:   Troponin I 0.04 (*)    All other components within normal limits  BRAIN NATRIURETIC PEPTIDE - Abnormal; Notable for the following components:   B Natriuretic Peptide 813.2 (*)    All other components within normal limits    EKG EKG Interpretation  Date/Time:  Friday April 22 2019 13:36:36 EDT Ventricular Rate:  62 PR Interval:    QRS Duration: 155 QT Interval:  472 QTC Calculation: 480 R Axis:   75 Text Interpretation:  Sinus rhythm Multiple premature complexes, vent & supraven Borderline prolonged PR interval Right bundle branch block No significant change was found Confirmed by Jola Schmidt [phone]) on 04/25/2019 7:54:21 AM   Radiology Dg Chest 2 View  Result Date: 04/22/2019 CLINICAL DATA:  Exertional shortness of breath for 3 days. EXAM: CHEST - 2 VIEW COMPARISON:  PA and lateral chest 09/28/2014. FINDINGS: The patient is status post CABG. Heart size is upper normal. There is interstitial pulmonary edema with associated small bilateral pleural effusions. No consolidative process or pneumothorax. IMPRESSION: Interstitial pulmonary edema with associated small pleural effusions. Electronically Signed   By: Inge Rise M.D.   On: 04/22/2019 15:00   Vas [place] Lower Extremity Venous (dvt) (only [facility])  Result Date: 04/22/2019  Lower Venous Study Indications: Edema.  Performing Technologist: Abram Sander RVS  Examination Guidelines: A complete evaluation includes B-mode imaging, spectral Doppler, color Doppler, and power Doppler as needed of all accessible portions of each vessel. Bilateral testing is considered an integral part of a complete examination. Limited examinations  for reoccurring indications may be performed as noted.  +---------+---------------+---------+-----------+----------+-------+ RIGHT    CompressibilityPhasicitySpontaneityPropertiesSummary +---------+---------------+---------+-----------+----------+-------+ CFV       Full           Yes      Yes                          +---------+---------------+---------+-----------+----------+-------+ SFJ      Full                                                 +---------+---------------+---------+-----------+----------+-------+ FV Prox  Full                                                 +---------+---------------+---------+-----------+----------+-------+ FV Mid   Full                                                 +---------+---------------+---------+-----------+----------+-------+ FV DistalFull                                                 +---------+---------------+---------+-----------+----------+-------+ PFV      Full                                                 +---------+---------------+---------+-----------+----------+-------+ POP      Full           Yes      Yes                          +---------+---------------+---------+-----------+----------+-------+ PTV      Full                                                 +---------+---------------+---------+-----------+----------+-------+ PERO     Full                                                 +---------+---------------+---------+-----------+----------+-------+   +---------+---------------+---------+-----------+----------+-------+ LEFT     CompressibilityPhasicitySpontaneityPropertiesSummary +---------+---------------+---------+-----------+----------+-------+ CFV      Full           Yes      Yes                          +---------+---------------+---------+-----------+----------+-------+ SFJ      Full                                                 +---------+---------------+---------+-----------+----------+-------+  FV Prox  Full                                                 +---------+---------------+---------+-----------+----------+-------+ FV Mid   Full                                                  +---------+---------------+---------+-----------+----------+-------+ FV DistalFull                                                 +---------+---------------+---------+-----------+----------+-------+ PFV      Full                                                 +---------+---------------+---------+-----------+----------+-------+ POP      Full           Yes      Yes                          +---------+---------------+---------+-----------+----------+-------+ PTV      Full                                                 +---------+---------------+---------+-----------+----------+-------+ PERO     Full                                                 +---------+---------------+---------+-----------+----------+-------+     Summary: Right: There is no evidence of deep vein thrombosis in the lower extremity. No cystic structure found in the popliteal fossa. Left: There is no evidence of deep vein thrombosis in the lower extremity. No cystic structure found in the popliteal fossa.  *See table(s) above for measurements and observations.    Preliminary     Procedures Procedures (including critical care time)  Medications Ordered in ED Medications  furosemide (LASIX) injection 40 mg (has no administration in time range)     Initial Impression / Assessment and Plan / ED Course  I have reviewed the triage vital signs and the nursing notes.  Pertinent labs & imaging results that were available during my care of the patient were reviewed by me and considered in my medical decision making (see chart for details).        Suspect new onset congestive heart failure.  Edema on chest x-ray.  Bilateral lower extremity duplexes are negative for DVT.  New orthopnea and lower extremity edema consistent with congestive heart failure.  No active chest pain or chest tightness.  EKG without ischemic changes.  Patient will be admitted to hospital for IV diuresis and echocardiogram.  He was  on Lovenox injections during his postoperative period therefore making PE less likely especially in  the setting of findings consistent with congestive heart failure  Final Clinical Impressions(s) / ED Diagnoses   Final diagnoses:  Acute congestive heart failure, unspecified heart failure type [facility])    ED Discharge Orders    None       Jola Schmidt, MD 04/25/19 [phone]

## 2019-04-22 NOTE — Telephone Encounter (Addendum)
Spoke with the pts wife, Andre Miranda, and she reports that the pt had walked to the mailbox but when he got back in the house he got very weak and sat down but wife worried he may have passed out if he didn't sit down right away.. he has been rather sedentary the last couple of months will all of the procedures her has had done.   He has been sob with exertion and when laying flat at night. His ankles and feet have also been swollen th e last 2-3 days.. she also says that his skin color has not looked good.   She called Dr. Joylene Draft and he suggested they call EMS. She is very anxious and upset and asking for Dr. Tamala Julian to call her. I advised her that Dr. Tamala Julian is in the Cath lab but the ER MD will evaluate the pt and will consult with Cardio if needed and he will be treated and I will forward to make Dr. Tamala Julian aware the pt is there at [facility]. She was thankful and will call if she has any questions or worries that we can help her with.   Pts wife cell [phone]

## 2019-04-22 NOTE — Progress Notes (Signed)
Held Metoprolol for tonight per MD order. Patient's HR ranging from 57-59.

## 2019-04-23 ENCOUNTER — Observation Stay (HOSPITAL_COMMUNITY): Payer: Medicare Other

## 2019-04-23 DIAGNOSIS — Z952 Presence of prosthetic heart valve: Secondary | ICD-10-CM | POA: Diagnosis not present

## 2019-04-23 DIAGNOSIS — I110 Hypertensive heart disease with heart failure: Secondary | ICD-10-CM | POA: Diagnosis present

## 2019-04-23 DIAGNOSIS — S82891D Other fracture of right lower leg, subsequent encounter for closed fracture with routine healing: Secondary | ICD-10-CM | POA: Diagnosis not present

## 2019-04-23 DIAGNOSIS — E785 Hyperlipidemia, unspecified: Secondary | ICD-10-CM | POA: Diagnosis present

## 2019-04-23 DIAGNOSIS — Z79890 Hormone replacement therapy (postmenopausal): Secondary | ICD-10-CM | POA: Diagnosis not present

## 2019-04-23 DIAGNOSIS — Z79899 Other long term (current) drug therapy: Secondary | ICD-10-CM | POA: Diagnosis not present

## 2019-04-23 DIAGNOSIS — I5033 Acute on chronic diastolic (congestive) heart failure: Secondary | ICD-10-CM

## 2019-04-23 DIAGNOSIS — Z951 Presence of aortocoronary bypass graft: Secondary | ICD-10-CM | POA: Diagnosis not present

## 2019-04-23 DIAGNOSIS — Z20828 Contact with and (suspected) exposure to other viral communicable diseases: Secondary | ICD-10-CM | POA: Diagnosis present

## 2019-04-23 DIAGNOSIS — I739 Peripheral vascular disease, unspecified: Secondary | ICD-10-CM | POA: Diagnosis present

## 2019-04-23 DIAGNOSIS — D696 Thrombocytopenia, unspecified: Secondary | ICD-10-CM | POA: Diagnosis present

## 2019-04-23 DIAGNOSIS — K219 Gastro-esophageal reflux disease without esophagitis: Secondary | ICD-10-CM | POA: Diagnosis present

## 2019-04-23 DIAGNOSIS — Z886 Allergy status to analgesic agent status: Secondary | ICD-10-CM | POA: Diagnosis not present

## 2019-04-23 DIAGNOSIS — I4510 Unspecified right bundle-branch block: Secondary | ICD-10-CM | POA: Diagnosis present

## 2019-04-23 DIAGNOSIS — I2510 Atherosclerotic heart disease of native coronary artery without angina pectoris: Secondary | ICD-10-CM | POA: Diagnosis present

## 2019-04-23 DIAGNOSIS — M109 Gout, unspecified: Secondary | ICD-10-CM | POA: Diagnosis present

## 2019-04-23 DIAGNOSIS — Z6835 Body mass index (BMI) 35.0-35.9, adult: Secondary | ICD-10-CM | POA: Diagnosis not present

## 2019-04-23 DIAGNOSIS — R0602 Shortness of breath: Secondary | ICD-10-CM | POA: Diagnosis not present

## 2019-04-23 DIAGNOSIS — Z87891 Personal history of nicotine dependence: Secondary | ICD-10-CM | POA: Diagnosis not present

## 2019-04-23 DIAGNOSIS — Z7982 Long term (current) use of aspirin: Secondary | ICD-10-CM | POA: Diagnosis not present

## 2019-04-23 DIAGNOSIS — G4733 Obstructive sleep apnea (adult) (pediatric): Secondary | ICD-10-CM | POA: Diagnosis present

## 2019-04-23 DIAGNOSIS — N179 Acute kidney failure, unspecified: Secondary | ICD-10-CM | POA: Diagnosis present

## 2019-04-23 DIAGNOSIS — Z8249 Family history of ischemic heart disease and other diseases of the circulatory system: Secondary | ICD-10-CM | POA: Diagnosis not present

## 2019-04-23 DIAGNOSIS — X58XXXD Exposure to other specified factors, subsequent encounter: Secondary | ICD-10-CM | POA: Diagnosis present

## 2019-04-23 LAB — BASIC METABOLIC PANEL
Anion gap: 12 (ref 5–15)
BUN: 34 mg/dL — ABNORMAL HIGH (ref 8–23)
CO2: 23 mmol/L (ref 22–32)
Calcium: 9.3 mg/dL (ref 8.9–10.3)
Chloride: 105 mmol/L (ref 98–111)
Creatinine, Ser: 1.44 mg/dL — ABNORMAL HIGH (ref 0.61–1.24)
GFR calc Af Amer: 53 mL/min — ABNORMAL LOW (ref >60)
GFR calc non Af Amer: 46 mL/min — ABNORMAL LOW (ref >60)
Glucose, Bld: 111 mg/dL — ABNORMAL HIGH (ref 70–99)
Potassium: 4.1 mmol/L (ref 3.5–5.1)
Sodium: 140 mmol/L (ref 135–145)

## 2019-04-23 LAB — LIPID PANEL
Cholesterol: 90 mg/dL (ref 0–200)
HDL: 32 mg/dL — ABNORMAL LOW (ref >40)
LDL Cholesterol: 42 mg/dL (ref 0–99)
Total CHOL/HDL Ratio: 2.8 RATIO
Triglycerides: 79 mg/dL (ref <150)
VLDL: 16 mg/dL (ref 0–40)

## 2019-04-23 LAB — TROPONIN I
Troponin I: 0.05 ng/mL (ref <0.03)
Troponin I: 0.05 ng/mL (ref <0.03)

## 2019-04-23 LAB — CBC
HCT: 37.2 % — ABNORMAL LOW (ref 39.0–52.0)
Hemoglobin: 12.4 g/dL — ABNORMAL LOW (ref 13.0–17.0)
MCH: 31.1 pg (ref 26.0–34.0)
MCHC: 33.3 g/dL (ref 30.0–36.0)
MCV: 93.2 fL (ref 80.0–100.0)
Platelets: 94 10*3/uL — ABNORMAL LOW (ref 150–400)
RBC: 3.99 MIL/uL — ABNORMAL LOW (ref 4.22–5.81)
RDW: 13.6 % (ref 11.5–15.5)
WBC: 7.6 10*3/uL (ref 4.0–10.5)
nRBC: 0 % (ref 0.0–0.2)

## 2019-04-23 LAB — ECHOCARDIOGRAM COMPLETE
Height: 70 in
Weight: 3980.8 oz

## 2019-04-23 MED ORDER — PERFLUTREN LIPID MICROSPHERE
1.0000 mL | INTRAVENOUS | Status: AC | PRN
  Administered 2019-04-23: 2 mL via INTRAVENOUS
  Filled 2019-04-23: qty 10

## 2019-04-23 MED ORDER — FUROSEMIDE 10 MG/ML IJ SOLN
40.0000 mg | Freq: Every day | INTRAMUSCULAR | Status: DC
  Administered 2019-04-24: 40 mg via INTRAVENOUS
  Filled 2019-04-23: qty 4

## 2019-04-23 NOTE — Discharge Summary (Signed)
Orthopedic Discharge Summary        Physician Discharge Summary  Patient ID: Andre Miranda MRN: [id number] DOB/AGE: [birth year] 81 y.o.  Admit date: 02/14/2019 Discharge date: 04/23/2019   Procedures:  Procedure(s) (LRB): OPEN REDUCTION INTERNAL FIXATION (ORIF) ANKLE FRACTURE (Right)  Attending Physician:  Dr. [name] Plants  Admission Diagnoses:   Right ankle fracture  Discharge Diagnoses:  same   Past Medical History:  Diagnosis Date  . Aortic stenosis    MODERATE  . Cancer [facility])    prostate cancer-radiation  . Carotid arterial disease ([place])   . Coronary artery disease   . Dysuria   . GERD (gastroesophageal reflux disease)   . Gout   . Hematuria   . History of urinary retention   . Hyperlipidemia   . Hypertension   . Obesity   . Sleep apnea    uses CPAP nightly    PCP: Crist Infante, MD   Discharged Condition: good  Hospital Course:  Patient underwent the above stated procedure on 02/14/2019. Patient tolerated the procedure well and brought to the recovery room in good condition and subsequently to the floor. Patient had an uncomplicated hospital course and was stable for discharge.   Disposition:  with follow up in 2 weeks   Follow-up Information    Netta Cedars, MD. Call in 2 weeks.   Specialty:  Orthopedic Surgery Why:  [phone] Contact information: [address] [place] [address] [phone]           Discharge Instructions    Call MD / Call 911   Complete by:  As directed    If you experience chest pain or shortness of breath, CALL 911 and be transported to the hospital emergency room.  If you develope a fever above 101 F, pus (white drainage) or increased drainage or redness at the wound, or calf pain, call your surgeon's office.   Constipation Prevention   Complete by:  As directed    Drink plenty of fluids.  Prune juice may be helpful.  You may use a stool softener, such as Colace (over the counter) 100 mg  twice a day.  Use MiraLax (over the counter) for constipation as needed.   Diet - low sodium heart healthy   Complete by:  As directed    Increase activity slowly as tolerated   Complete by:  As directed       Allergies as of 02/14/2019      Reactions   Ibuprofen Hypertension      Medication List    STOP taking these medications   HYDROcodone-acetaminophen 5-325 MG tablet Commonly known as:  NORCO/VICODIN     TAKE these medications   allopurinol 300 MG tablet Commonly known as:  ZYLOPRIM Take 300 mg by mouth daily.   amLODipine 10 MG tablet Commonly known as:  NORVASC TAKE 1 TABLET BY MOUTH ONCE DAILY   aspirin EC 81 MG tablet Take 81 mg by mouth at bedtime.   Centrum Silver tablet Take 1 tablet by mouth daily.   famotidine 20 MG tablet Commonly known as:  PEPCID Take 20 mg by mouth daily.   hydrochlorothiazide 25 MG tablet Commonly known as:  HYDRODIURIL Take 25 mg by mouth daily.   ketoconazole 2 % cream Commonly known as:  NIZORAL Apply 1 application topically daily as needed for irritation.   metoprolol tartrate 50 MG tablet Commonly known as:  LOPRESSOR Take 50 mg by mouth 2 (two) times daily.   oxyCODONE-acetaminophen  5-325 MG tablet Commonly known as:  PERCOCET/ROXICET Take 1 tablet by mouth every 4 (four) hours as needed for moderate pain or severe pain. What changed:  reasons to take this   ramipril 10 MG capsule Commonly known as:  ALTACE Take 1 capsule (10 mg total) by mouth daily.   rosuvastatin 20 MG tablet Commonly known as:  CRESTOR Take 10 mg by mouth every evening.   SENOKOT PO Take 1 tablet by mouth daily as needed (constipation). FOR CONSTIPATION   spironolactone 25 MG tablet Commonly known as:  ALDACTONE TAKE 1 TABLET BY MOUTH ONCE DAILY. PLEASE KEEP UPCOMING APPT IN FEB FOR FUTURE REFILLS What changed:  See the new instructions.   tamsulosin 0.4 MG Caps capsule Commonly known as:  FLOMAX Take 0.4 mg by mouth every evening.    Vitamin D3 25 MCG (1000 UT) Caps Take 1 capsule by mouth at bedtime.     ASK your doctor about these medications   clindamycin 150 MG capsule Commonly known as:  CLEOCIN Take 150 mg by mouth as needed. 4 Capsules 7mins prior to dental appt  (600mg )         Signed: Augustin Schooling 04/23/2019, 11:05 AM  [place] [facility] [employer] [facility] [address], [place], [place], [place] [address] Phone: [place]

## 2019-04-23 NOTE — Progress Notes (Signed)
[facility] Progress Note  Patient: Andre Miranda SAY:[id number]   PCP: Crist Infante, MD DOB: 02/12/1936   DOA: 04/22/2019   DOS: 04/23/2019   Date of Service: the patient was seen and examined on 04/23/2019  Brief hospital course: Pt. with PMH of CAD S/P CABG, aortic stenosis S/P TAVR, PVD, chronic diastolic CHF, morbid obesity, OSA, recent ankle fracture; admitted on 04/22/2019, presented with complaint of shortnes of breath, was found to have Acute diastolic CHF on chronic. Currently further plan is continue IV diuresis.  Subjective: Breathing improved but still has swelling in the leg.  No chest pain abdominal pain.  No acute events overnight on telemetry.  Assessment and Plan: 1. Acute on chronic diastolic CHF (congestive heart failure) (HCC) Suspecting acute on chronic diastolic CHF. Echocardiogram showed preserved EF, no significant valvular abnormality Serial troponins normal as well. Continue with IV Lasix 40 mg daily. Given volume overload suspect that he will require continue diuresis. Likely currently on room air.  2.  Essential hypertension. Blood pressure soft.  Holding blood pressure medication for now.  3.  OSA. Morbid obesity. Not on CPAP. Monitor.  4.  CAD Peripheral vascular disease Dyslipidemia. Continue home regimen.  5.  Mild AKI. Likely cardiorenal hemodynamics. Monitor renal function while patient is achieving diuresis.  Renal function stable but much higher than his baseline. We will discontinue ramipril.  6.  Chronic thrombocytopenia. Present since at least 2016. Continue to monitor. Discontinue heparin.  Diet: Cardiac diet DVT Prophylaxis: SCD, pharmacological prophylaxis contraindicated due to Thrombocytopenia  Advance goals of care discussion: Full code  Family Communication: no family was present at bedside, at the time of interview.   Disposition:  Discharge to Home likely tomorrow.  Consultants: none Procedures: none   Scheduled Meds: . allopurinol  300 mg Oral Daily  . amLODipine  10 mg Oral Daily  . aspirin EC  81 mg Oral QHS  . famotidine  20 mg Oral Daily  . [START ON 04/24/2019] furosemide  40 mg Intravenous Daily  . metoprolol tartrate  50 mg Oral BID  . rosuvastatin  10 mg Oral QPM  . sodium chloride flush  3 mL Intravenous Q12H  . tamsulosin  0.4 mg Oral QPM   Continuous Infusions: . sodium chloride     PRN Meds: sodium chloride, acetaminophen, ketoconazole, ondansetron (ZOFRAN) IV, oxyCODONE-acetaminophen, sodium chloride flush, temazepam Antibiotics: Anti-infectives (From admission, onward)   None       Objective: Physical Exam: Vitals:   04/23/19 0522 04/23/19 0828 04/23/19 1302 04/23/19 1708  BP:  (!) 159/38 (!) 108/46 (!) 106/38  Pulse: 65 (!) 59 67 60  Resp:   16 17  Temp:   98 F (36.7 C) 98.6 F (37 C)  TempSrc:   Oral Oral  SpO2:   95% 94%  Weight:      Height:        Intake/Output Summary (Last 24 hours) at 04/23/2019 1900 Last data filed at 04/23/2019 1816 Gross per 24 hour  Intake 1083 ml  Output 2300 ml  Net -1217 ml   Filed Weights   04/22/19 1334 04/22/19 2009 04/23/19 0507  Weight: 112.6 kg 113.6 kg 112.9 kg   General: alert and . Appear in mild distress, affect appropriate Eyes: PERRL, Conjunctiva normal ENT: Oral Mucosa Clear, moist  Neck: no JVD, no Abnormal Mass Or lumps Cardiovascular: S1 and S2 Present, no Murmur, peripheral pulses symmetrical Respiratory: normal respiratory effort, Bilateral Air entry equal and Decreased, no use of accessory muscle,  faint basal Crackles, no wheezes Abdomen: Bowel Sound present, Soft and no tenderness, no hernia Skin: no rashes  Extremities: bilateral  Pedal edema, no calf tenderness Neurologic: normal without focal findings, mental status, speech normal, alert and oriented x3, PERLA, Motor strength 5/5 and symmetric and sensation grossly normal to light touch Gait not checked due to patient safety concerns  Data  Reviewed: CBC: Recent Labs  Lab 04/22/19 1405 04/23/19 0712  WBC 8.9 7.6  HGB 12.3* 12.4*  HCT 37.4* 37.2*  MCV 94.9 93.2  PLT 90* 94*   Basic Metabolic Panel: Recent Labs  Lab 04/22/19 1405 04/23/19 0249  NA 136 140  K 4.1 4.1  CL 106 105  CO2 19* 23  GLUCOSE 120* 111*  BUN 37* 34*  CREATININE 1.42* 1.44*  CALCIUM 9.2 9.3    Liver Function Tests: Recent Labs  Lab 04/22/19 1405  AST 24  ALT 20  ALKPHOS 66  BILITOT 1.7*  PROT 6.1*  ALBUMIN 3.8   No results for input(s): LIPASE, AMYLASE in the last 168 hours. No results for input(s): AMMONIA in the last 168 hours. Coagulation Profile: No results for input(s): INR, PROTIME in the last 168 hours. Cardiac Enzymes: Recent Labs  Lab 04/22/19 1405 04/22/19 1950 04/23/19 0249 04/23/19 0712  TROPONINI 0.04* 0.05* 0.05* 0.05*   BNP (last 3 results) No results for input(s): PROBNP in the last 8760 hours. CBG: No results for input(s): GLUCAP in the last 168 hours. Studies: No results found.   Time spent: 35 minutes  Author: Berle Mull, MD [facility] 04/23/2019 7:00 PM  To reach On-call, see care teams to locate the attending and reach out to them via [email or web address]. If 7PM-7AM, please contact night-coverage If you still have difficulty reaching the attending provider, please page the [facility] (Director on Call) for [facility] on amion for assistance.

## 2019-04-24 ENCOUNTER — Encounter (HOSPITAL_COMMUNITY): Payer: Self-pay | Admitting: *Deleted

## 2019-04-24 LAB — BASIC METABOLIC PANEL
Anion gap: 10 (ref 5–15)
BUN: 27 mg/dL — ABNORMAL HIGH (ref 8–23)
CO2: 26 mmol/L (ref 22–32)
Calcium: 9.3 mg/dL (ref 8.9–10.3)
Chloride: 103 mmol/L (ref 98–111)
Creatinine, Ser: 1.37 mg/dL — ABNORMAL HIGH (ref 0.61–1.24)
GFR calc Af Amer: 56 mL/min — ABNORMAL LOW (ref >60)
GFR calc non Af Amer: 48 mL/min — ABNORMAL LOW (ref >60)
Glucose, Bld: 138 mg/dL — ABNORMAL HIGH (ref 70–99)
Potassium: 4.4 mmol/L (ref 3.5–5.1)
Sodium: 139 mmol/L (ref 135–145)

## 2019-04-24 LAB — CBC
HCT: 37.4 % — ABNORMAL LOW (ref 39.0–52.0)
Hemoglobin: 12.4 g/dL — ABNORMAL LOW (ref 13.0–17.0)
MCH: 31.2 pg (ref 26.0–34.0)
MCHC: 33.2 g/dL (ref 30.0–36.0)
MCV: 94 fL (ref 80.0–100.0)
Platelets: 83 10*3/uL — ABNORMAL LOW (ref 150–400)
RBC: 3.98 MIL/uL — ABNORMAL LOW (ref 4.22–5.81)
RDW: 13.6 % (ref 11.5–15.5)
WBC: 6.2 10*3/uL (ref 4.0–10.5)
nRBC: 0 % (ref 0.0–0.2)

## 2019-04-24 MED ORDER — FUROSEMIDE 20 MG PO TABS: 20.0000 mg | ORAL_TABLET | Freq: Every day | ORAL | 0 refills | Status: DC | PRN

## 2019-04-24 MED ORDER — FUROSEMIDE 20 MG PO TABS: ORAL_TABLET | ORAL | 0 refills | Status: DC

## 2019-04-24 NOTE — TOC Initial Note (Signed)
Transition of Care [facility]) - Initial/Assessment Note    Patient Details  Name: Andre Miranda MRN: [id number] Date of Birth: 1936/04/06  [facility] CM/SW Contact:    Carles Collet, RN Phone Number: 04/24/2019, 12:20 PM  Clinical Narrative:      Spoke to patient at bedside. He states that he lives at home with hs wife, they both drive. He demonstrated good bed mobility from lying to sitting at edge of bed while I was in room. He wears CPAP at night at hme, no home oxygen needs. He states he has a scale but has not been using it much lately "since COVID." he states that he knows what to do, "doing it is the hard part."      PCP Crist Infante, MD Cardiologist Belva Crome, MD No immediate CM needs identified, will continue to follow.            Expected Discharge Plan: Home/Self Care Barriers to Discharge: Continued Medical Work up   Patient Goals and CMS Choice        Expected Discharge Plan and Services Expected Discharge Plan: Home/Self Care                                              Prior Living Arrangements/Services     Patient language and need for interpreter reviewed:: Yes Do you feel safe going back to the place where you live?: Yes            Criminal Activity/Legal Involvement Pertinent to Current Situation/Hospitalization: No - Comment as needed  Activities of Daily Living Home Assistive Devices/Equipment: Cane (specify quad or straight) ADL Screening (condition at time of admission) Patient's cognitive ability adequate to safely complete daily activities?: Yes Is the patient deaf or have difficulty hearing?: Yes Does the patient have difficulty seeing, even when wearing glasses/contacts?: No Does the patient have difficulty concentrating, remembering, or making decisions?: No Patient able to express need for assistance with ADLs?: Yes Does the patient have difficulty dressing or bathing?: No Independently performs ADLs?: Yes  (appropriate for developmental age) Does the patient have difficulty walking or climbing stairs?: Yes Weakness of Legs: Right Weakness of Arms/Hands: None  Permission Sought/Granted                  Emotional Assessment              Admission diagnosis:  Acute congestive heart failure, unspecified heart failure type ([place]) [I50.9] Acute on chronic diastolic CHF (congestive heart failure) ([place]) [I50.33] Patient Active Problem List   Diagnosis Date Noted  . Acute on chronic diastolic CHF (congestive heart failure) ([place]) 04/22/2019  . 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 Accelerated hypertension 07/27/2017  . Right-sided chest wall pain 09/28/2014  . Occlusion and stenosis of carotid artery without mention of cerebral infarction 03/28/2014  . Acute bronchitis with asthma 08/25/2012  . Multiple lung nodules 06/20/2012  . Hyperlipidemia 10/02/2011  . CAD (coronary artery disease) 04/04/2011  . Hypertension   . Obesity   . Dysuria   . Hematuria   . Aortic stenosis   . Obstructive sleep apnea   . Gout   . Carotid arterial disease ([place])    PCP:  Crist Infante, MD Pharmacy:   [facility] [address] [place] - [address] [place]  [address] Phone: [phone] Fax: [phone]     Social Determinants of Health (SDOH) Interventions    Readmission Risk Interventions No flowsheet data found.

## 2019-04-24 NOTE — Progress Notes (Signed)
Per CCMD- patient having more frequent PVCs Patient is asymptomatic and sitting in chair eating

## 2019-04-25 NOTE — Telephone Encounter (Signed)
We need to contact her. I have tried and left a VM.

## 2019-04-25 NOTE — Discharge Summary (Signed)
[facility] Discharge Summary   Patient: Andre Miranda ION:[id number]   PCP: Crist Infante, MD DOB: [birth year]   Date of admission: 04/22/2019   Date of discharge: 04/24/2019     Discharge Diagnoses:   Principal Problem:   Acute on chronic diastolic CHF (congestive heart failure) ([place]) Active Problems:   Hypertension   Obesity   Aortic stenosis   Obstructive sleep apnea   Carotid arterial disease (HCC)   CAD (coronary artery disease)   Hyperlipidemia   S/P TAVR (transcatheter aortic valve replacement)   Admitted From: Home Disposition:  Home   Recommendations for Outpatient Follow-up:  1. Please follow-up with PCP in 1 week.  Will require adjustment of Lasix.  Follow-up Information    Crist Infante, MD. Schedule an appointment as soon as possible for a visit in 1 week.   Specialty:  Internal Medicine Why:  Left message for office to call patient Contact information: [place] [address] [phone]        Belva Crome, MD. Schedule an appointment as soon as possible for a visit in 1 month(s).   Specialty:  Cardiology Contact information: [address] [place] [address] [phone]          Diet recommendation: Cardiac diet  Activity: The patient is advised to gradually reintroduce usual activities,as tolerated .  Discharge Condition: good  Code Status: Full code  History of present illness: As per the H and P dictated on admission, "Andre Miranda is a 83 y.o. male with Past medical history of CAD S/P CABG, aortic stenosis S/P TAVR, PVD, chronic diastolic CHF, morbid obesity, OSA, recent ankle fracture. Patient presents with complaints of shortness of breath progressively worsening over last few weeks.  He also reports bilateral leg swelling ongoing for last few weeks.  No chest pain no chest heaviness no chest tightness reported.  No dizziness no lightheadedness reported. Patient also reports orthopnea  and PND ongoing for last few days. No fever no chills no cough. Patient recently had ankle fracture and was wearing a cast on her right ankle.  Patient was using Lovenox injection up until 2 weeks ago he stopped using it.  Currently patient is in brace. Denies any similar episode in the past.  No other acute change in medications reported by patient."  Hospital Course:  Summary of his active problems in the hospital is as following. 1.Acute on chronic diastolic CHF (congestive heart failure) (HCC) Echocardiogram showed preserved EF, no significant valvular abnormality Serial troponins normal as well. Treated with IV Lasix.   We will transition to 40 mg Lasix daily for 4 more days followed by 20 mg Lasix daily with 20 mg Lasix as needed. Potassium level remained stable here in the hospital. Continue Aldactone at home. Renal function actually improving with diuresis.  2.Essential hypertension. Sinus bradycardia-asymptomatic-heart rate in 50s during awake times Blood pressure soft in 90s and 100 low 100s. Patient's Norvasc was continued.  Patient's Lopressor was discontinued ramipril was discontinued. HCTZ was also discontinued Aldactone was not kept on hold, resume on discharge.  3.OSA. Morbid obesity. Not on CPAP. Monitor.  4.CAD Peripheral vascular disease Dyslipidemia. Continue home regimen.  5.Mild AKI. Likely cardiorenal hemodynamics. Monitor renal function while patient is achieving diuresis.  Renal function stable but much higher than his baseline. We will discontinue ramipril.  6.  Chronic thrombocytopenia. Present since at least 2016. Continue to monitor. Discontinue heparin.   Patient was ambulatory without any assistance. On the  day of the discharge the patient's vitals were stable, and no other acute medical condition were reported by patient. the patient was felt safe to be discharge at Home with family.  Consultants: none Procedures:  Echocardiogram   DISCHARGE MEDICATION: Allergies as of 04/24/2019      Reactions   Ibuprofen Hypertension      Medication List    STOP taking these medications   clindamycin 150 MG capsule Commonly known as:  CLEOCIN   hydrochlorothiazide 25 MG tablet Commonly known as:  HYDRODIURIL   metoprolol tartrate 50 MG tablet Commonly known as:  LOPRESSOR   ramipril 10 MG capsule Commonly known as:  ALTACE     TAKE these medications   allopurinol 300 MG tablet Commonly known as:  ZYLOPRIM Take 300 mg by mouth daily. Notes to patient:  Tomorrow    amLODipine 10 MG tablet Commonly known as:  NORVASC TAKE 1 TABLET BY MOUTH ONCE DAILY Notes to patient:  Tomorrow    aspirin EC 81 MG tablet Take 81 mg by mouth at bedtime. Notes to patient:  This evening   Centrum Silver tablet Take 1 tablet by mouth daily. Notes to patient:  Tomorrow    famotidine 20 MG tablet Commonly known as:  PEPCID Take 20 mg by mouth daily. Notes to patient:  Tomorrow    furosemide 20 MG tablet Commonly known as:  Lasix 40 mg daily for 5 days, followed by 20 mg daily. Notes to patient:  See special instruction above for tapering    furosemide 20 MG tablet Commonly known as:  Lasix Take 1 tablet (20 mg total) by mouth daily as needed for fluid or edema (weight gain of 3 lbs in a day or 5 lbs in 2 days).   ketoconazole 2 % cream Commonly known as:  NIZORAL Apply 1 application topically daily as needed for irritation.   Melatonin 3 MG Tabs Take 3 mg by mouth at bedtime as needed (sleep).   oxyCODONE-acetaminophen 5-325 MG tablet Commonly known as:  PERCOCET/ROXICET Take 1 tablet by mouth every 4 (four) hours as needed for moderate pain or severe pain.   phenazopyridine 95 MG tablet Commonly known as:  PYRIDIUM Take 95 mg by mouth 3 (three) times daily as needed for pain (UTI symptoms).   Restoril 30 MG capsule Generic drug:  temazepam Take 30 mg by mouth at bedtime as needed for sleep.     rosuvastatin 20 MG tablet Commonly known as:  CRESTOR Take 10 mg by mouth every evening. Notes to patient:  This evening   SENOKOT PO Take 1 tablet by mouth daily as needed (constipation). FOR CONSTIPATION   spironolactone 25 MG tablet Commonly known as:  ALDACTONE TAKE 1 TABLET BY MOUTH ONCE DAILY. PLEASE KEEP UPCOMING APPT IN FEB FOR FUTURE REFILLS What changed:  See the new instructions.   tamsulosin 0.4 MG Caps capsule Commonly known as:  FLOMAX Take 0.4 mg by mouth every evening. Notes to patient:  This evening   Vitamin D3 25 MCG (1000 UT) Caps Take 1 capsule by mouth at bedtime. Notes to patient:  This evening      Allergies  Allergen Reactions   Ibuprofen Hypertension   Discharge Instructions    (HEART FAILURE PATIENTS) Call MD:  Anytime you have any of the following symptoms: 1) 3 pound weight gain in 24 hours or 5 pounds in 1 week 2) shortness of breath, with or without a dry hacking cough 3) swelling in the hands, feet or  stomach 4) if you have to sleep on extra pillows at night in order to breathe.   Complete by:  As directed    Diet - low sodium heart healthy   Complete by:  As directed    Discharge instructions   Complete by:  As directed    It is important that you read the given instructions as well as go over your medication list with RN to help you understand your care after this hospitalization.  Discharge Instructions: Please follow-up with PCP in 1-2 weeks  Please request your primary care physician to go over all Hospital Tests and Procedure/Radiological results at the follow up. Please get all Hospital records sent to your PCP by signing hospital release before you go home.   Do not take more than prescribed Pain, Sleep and Anxiety Medications. You were cared for by a hospitalist during your hospital stay. If you have any questions about your discharge medications or the care you received while you were in the hospital after you are discharged,  you can call the unit @UNIT @ you were admitted to and ask to speak with the hospitalist on call if the hospitalist that took care of you is not available.  Once you are discharged, your primary care physician will handle any further medical issues. Please note that NO REFILLS for any discharge medications will be authorized once you are discharged, as it is imperative that you return to your primary care physician (or establish a relationship with a primary care physician if you do not have one) for your aftercare needs so that they can reassess your need for medications and monitor your lab values. You Must read complete instructions/literature along with all the possible adverse reactions/side effects for all the Medicines you take and that have been prescribed to you. Take any new Medicines after you have completely understood and accept all the possible adverse reactions/side effects. Wear Seat belts while driving. If you have smoked or chewed Tobacco in the last 2 yrs please stop smoking and/or stop any Recreational drug use.  If you drink alcohol, please moderate the use and do not drive, operating heavy machinery, perform activities at heights, swimming or participation in water activities or provide baby sitting services under influence.   Increase activity slowly   Complete by:  As directed      Discharge Exam: Filed Weights   04/22/19 2009 04/23/19 0507 04/24/19 0431  Weight: 113.6 kg 112.9 kg 111.8 kg   Vitals:   04/24/19 1010 04/24/19 1209  BP: (!) 145/50 (!) 144/56  Pulse: (!) 42 (!) 42  Resp:  18  Temp:  98.9 F (37.2 C)  SpO2: 98% 98%   General: Appear in no distress, no Rash; Oral Mucosa Clear, moist. no Abnormal Mass Or lumps Cardiovascular: S1 and S2 Present, no Murmur, Respiratory: normal respiratory effort, Bilateral Air entry present and Clear to Auscultation, no Crackles, no wheezes Abdomen: Bowel Sound present, Soft and no tenderness, no hernia Extremities: no  Pedal edema, no calf tenderness Neurology: alert and oriented to time, place, and person affect appropriate. normal without focal findings, mental status, speech normal, alert and oriented x3, PERLA, Motor strength 5/5 and symmetric and sensation grossly normal to light touch   The results of significant diagnostics from this hospitalization (including imaging, microbiology, ancillary and laboratory) are listed below for reference.    Significant Diagnostic Studies: Dg Chest 2 View  Result Date: 04/22/2019 CLINICAL DATA:  Exertional shortness of breath for 3 days. EXAM:  CHEST - 2 VIEW COMPARISON:  PA and lateral chest 09/28/2014. FINDINGS: The patient is status post CABG. Heart size is upper normal. There is interstitial pulmonary edema with associated small bilateral pleural effusions. No consolidative process or pneumothorax. IMPRESSION: Interstitial pulmonary edema with associated small pleural effusions. Electronically Signed   By: Inge Rise M.D.   On: 04/22/2019 15:00   Vas [place] Lower Extremity Venous (dvt) (only [facility])  Result Date: 04/22/2019  Lower Venous Study Indications: Edema.  Performing Technologist: Abram Sander RVS  Examination Guidelines: A complete evaluation includes B-mode imaging, spectral Doppler, color Doppler, and power Doppler as needed of all accessible portions of each vessel. Bilateral testing is considered an integral part of a complete examination. Limited examinations for reoccurring indications may be performed as noted.  +---------+---------------+---------+-----------+----------+-------+  RIGHT     Compressibility Phasicity Spontaneity Properties Summary  +---------+---------------+---------+-----------+----------+-------+  CFV       Full            Yes       Yes                             +---------+---------------+---------+-----------+----------+-------+  SFJ       Full                                                       +---------+---------------+---------+-----------+----------+-------+  FV Prox   Full                                                      +---------+---------------+---------+-----------+----------+-------+  FV Mid    Full                                                      +---------+---------------+---------+-----------+----------+-------+  FV Distal Full                                                      +---------+---------------+---------+-----------+----------+-------+  PFV       Full                                                      +---------+---------------+---------+-----------+----------+-------+  POP       Full            Yes       Yes                             +---------+---------------+---------+-----------+----------+-------+  PTV       Full                                                      +---------+---------------+---------+-----------+----------+-------+  PERO      Full                                                      +---------+---------------+---------+-----------+----------+-------+   +---------+---------------+---------+-----------+----------+-------+  LEFT      Compressibility Phasicity Spontaneity Properties Summary  +---------+---------------+---------+-----------+----------+-------+  CFV       Full            Yes       Yes                             +---------+---------------+---------+-----------+----------+-------+  SFJ       Full                                                      +---------+---------------+---------+-----------+----------+-------+  FV Prox   Full                                                      +---------+---------------+---------+-----------+----------+-------+  FV Mid    Full                                                      +---------+---------------+---------+-----------+----------+-------+  FV Distal Full                                                      +---------+---------------+---------+-----------+----------+-------+  PFV       Full                                                       +---------+---------------+---------+-----------+----------+-------+  POP       Full            Yes       Yes                             +---------+---------------+---------+-----------+----------+-------+  PTV       Full                                                      +---------+---------------+---------+-----------+----------+-------+  PERO      Full                                                      +---------+---------------+---------+-----------+----------+-------+  Summary: Right: There is no evidence of deep vein thrombosis in the lower extremity. No cystic structure found in the popliteal fossa. Left: There is no evidence of deep vein thrombosis in the lower extremity. No cystic structure found in the popliteal fossa.  *See table(s) above for measurements and observations. Electronically signed by Servando Snare MD on 04/22/2019 at 4:26:00 PM.    Final     Microbiology: Recent Results (from the past 240 hour(s))  SARS Coronavirus 2 (CEPHEID - Performed in [facility] hospital lab), Hosp Order     Status: None   Collection Time: 04/22/19  4:01 PM  Result Value Ref Range Status   SARS Coronavirus 2 NEGATIVE NEGATIVE Final    Comment: (NOTE) If result is NEGATIVE SARS-CoV-2 target nucleic acids are NOT DETECTED. The SARS-CoV-2 RNA is generally detectable in upper and lower  respiratory specimens during the acute phase of infection. The lowest  concentration of SARS-CoV-2 viral copies this assay can detect is 250  copies / mL. A negative result does not preclude SARS-CoV-2 infection  and should not be used as the sole basis for treatment or other  patient management decisions.  A negative result may occur with  improper specimen collection / handling, submission of specimen other  than nasopharyngeal swab, presence of viral mutation(s) within the  areas targeted by this assay, and inadequate number of viral copies  (<250 copies /  mL). A negative result must be combined with clinical  observations, patient history, and epidemiological information. If result is POSITIVE SARS-CoV-2 target nucleic acids are DETECTED. The SARS-CoV-2 RNA is generally detectable in upper and lower  respiratory specimens dur ing the acute phase of infection.  Positive  results are indicative of active infection with SARS-CoV-2.  Clinical  correlation with patient history and other diagnostic information is  necessary to determine patient infection status.  Positive results do  not rule out bacterial infection or co-infection with other viruses. If result is PRESUMPTIVE POSTIVE SARS-CoV-2 nucleic acids MAY BE PRESENT.   A presumptive positive result was obtained on the submitted specimen  and confirmed on repeat testing.  While 2019 novel coronavirus  (SARS-CoV-2) nucleic acids may be present in the submitted sample  additional confirmatory testing may be necessary for epidemiological  and / or clinical management purposes  to differentiate between  SARS-CoV-2 and other Sarbecovirus currently known to infect humans.  If clinically indicated additional testing with an alternate test  methodology [phone]) is advised. The SARS-CoV-2 RNA is generally  detectable in upper and lower respiratory sp ecimens during the acute  phase of infection. The expected result is Negative. Fact Sheet for Patients:  StrictlyIdeas.no Fact Sheet for Healthcare Providers: BankingDealers.co.za This test is not yet approved or cleared by the [place] FDA and has been authorized for detection and/or diagnosis of SARS-CoV-2 by FDA under an Emergency Use Authorization (EUA).  This EUA will remain in effect (meaning this test can be used) for the duration of the COVID-19 declaration under Section 564(b)(1) of the Act, 21 U.S.C. section 360bbb-3(b)(1), unless the authorization is terminated or revoked  sooner. Performed at [facility], [place] [address], [place], [place] [address]      Labs: CBC: Recent Labs  Lab 04/22/19 1405 04/23/19 0712 04/24/19 0530  WBC 8.9 7.6 6.2  HGB 12.3* 12.4* 12.4*  HCT 37.4* 37.2* 37.4*  MCV 94.9 93.2 94.0  PLT 90* 94* 83*   Basic Metabolic Panel: Recent Labs  Lab 04/22/19 1405 04/23/19 0249  04/24/19 0530  NA 136 140 139  K 4.1 4.1 4.4  CL 106 105 103  CO2 19* 23 26  GLUCOSE 120* 111* 138*  BUN 37* 34* 27*  CREATININE 1.42* 1.44* 1.37*  CALCIUM 9.2 9.3 9.3   Liver Function Tests: Recent Labs  Lab 04/22/19 1405  AST 24  ALT 20  ALKPHOS 66  BILITOT 1.7*  PROT 6.1*  ALBUMIN 3.8   No results for input(s): LIPASE, AMYLASE in the last 168 hours. No results for input(s): AMMONIA in the last 168 hours. Cardiac Enzymes: Recent Labs  Lab 04/22/19 1405 04/22/19 1950 04/23/19 0249 04/23/19 0712  TROPONINI 0.04* 0.05* 0.05* 0.05*   BNP (last 3 results) Recent Labs    04/22/19 1504  BNP 813.2*   CBG: No results for input(s): GLUCAP in the last 168 hours. Time spent: 35 minutes  Signed:  Berle Mull  [facility] 04/24/2019

## 2019-04-25 NOTE — Telephone Encounter (Signed)
Called wife on her cell phone [phone].  She states pt is doing great.  SOB and swelling much improved.  Pt is moving around a lot.  BP this morning was 123/73, O2 has been good and weight was 237lbs on home scale this morning.  Hospital d/c says to f/u in 1 month in our office.  Advised I will send message to Dr. Tamala Julian to see if ok to do virtual visit or should pt be seen in the office?

## 2019-04-25 NOTE — Telephone Encounter (Signed)
HCTZ was stopped and he was started on Furosemide 40mg  QD x 5 days, then decrease to 20mg  QD.

## 2019-04-25 NOTE — Telephone Encounter (Signed)
He needs OV, FTF for f/u CHF. Did he go home on higher lasix dose? Will need bloood work in 1 week posyt discharge - BMET and CBC if not already ordered.

## 2019-04-28 NOTE — Telephone Encounter (Signed)
Spoke with wife and went over recommendations.  She and pt are agreeable to come into office next week for visit and would like to do labs same day.  Scheduled pt to see Cecilie Kicks, NP 05/05/2019.  Advised someone will call next week to screen for COVID sx.  Wife verbalized understanding and was appreciative for call.

## 2019-04-28 NOTE — Telephone Encounter (Signed)
Patient's wife returned your call

## 2019-04-28 NOTE — Telephone Encounter (Signed)
Left message to call back  

## 2019-05-03 DIAGNOSIS — E785 Hyperlipidemia, unspecified: Secondary | ICD-10-CM | POA: Diagnosis not present

## 2019-05-03 DIAGNOSIS — I2510 Atherosclerotic heart disease of native coronary artery without angina pectoris: Secondary | ICD-10-CM | POA: Diagnosis not present

## 2019-05-03 DIAGNOSIS — C61 Malignant neoplasm of prostate: Secondary | ICD-10-CM | POA: Diagnosis not present

## 2019-05-03 DIAGNOSIS — I509 Heart failure, unspecified: Secondary | ICD-10-CM | POA: Diagnosis not present

## 2019-05-03 DIAGNOSIS — D696 Thrombocytopenia, unspecified: Secondary | ICD-10-CM | POA: Diagnosis not present

## 2019-05-03 DIAGNOSIS — I110 Hypertensive heart disease with heart failure: Secondary | ICD-10-CM | POA: Diagnosis not present

## 2019-05-03 DIAGNOSIS — I350 Nonrheumatic aortic (valve) stenosis: Secondary | ICD-10-CM | POA: Diagnosis not present

## 2019-05-04 ENCOUNTER — Telehealth: Payer: Self-pay | Admitting: *Deleted

## 2019-05-04 NOTE — Progress Notes (Signed)
Cardiology Office Note   Date:  05/05/2019   ID:  JAIVIAN BATTAGLINI, DOB [birth year], MRN [id number]  PCP:  Crist Infante, MD  Cardiologist:  Dr. Tamala Julian     Chief Complaint  Patient presents with  . Hospitalization Follow-up      History of Present Illness: Andre Miranda is a 83 y.o. male who presents for post hospitlaization.    Pt with hx of CAD s/p CABG2003LIMA to LAD, RIMA to RCA, SVG sequenced to OM and distal left circumflex and aortic stenosis s/p TAVR 2016 [place], hyperlipidemia, and most recently difficulty with severe blood pressure elevation.   SR with PVC  On outpt monitor 10% PVCs   Done 12/2018.   He was admitted with CHF, with recent hx of fx ankle - Rt, in March after a slide down a hill  He did well initially but then had some lower ext edema his HCTZ was not taking care of.  He was isolating at home and ate more salt he believes.   The edema increased and then after walking back up  [place] from mailbox became very SOB and could hardly make it back in house.  EMS called and taken to ER - today he notes he did have have some racing HR with his SOB.  In Hospiital he did have big. PVCs.  Also no DVT and Echo was stable.   troponins were neg.   At discharge his lopressor and altace were stopped and HCTZ was stopped and Lasix 20 mg 2 daily for 4 days then 1 daily which he is on now.    Today No chest pain.  Now much improved no SOB and no racing HR. Still with PVCs Bp is stable today 136/68 and with his apple watch BP 113/60         Past Medical History:  Diagnosis Date  . Aortic stenosis    MODERATE  . Cancer [facility])    prostate cancer-radiation  . Carotid arterial disease ([place])   . Coronary artery disease   . Dysuria   . GERD (gastroesophageal reflux disease)   . Gout   . Hematuria   . History of urinary retention   . Hyperlipidemia   . Hypertension   . Obesity   . Sleep apnea    uses CPAP nightly    Past Surgical History:  Procedure Laterality Date   . AORTIC VALVE REPAIR  February 06, 2015   Dr. Aline Brochure and Dr. Ysidro Evert  at [facility]  . [place]  02/2008  . CHOLECYSTECTOMY  04/2002  . CORONARY ARTERY BYPASS GRAFT  2003   LIMA TO THE LAD, RIMA TO THE RCA, AND A SAPHENOUS VEIN GRAFT TO THE INTERMEDIATE AND DISTAL LEFT CIRCUMFLEX  . ORIF ANKLE FRACTURE Right 02/14/2019   Procedure: OPEN REDUCTION INTERNAL FIXATION (ORIF) ANKLE FRACTURE;  Surgeon: Netta Cedars, MD;  Location: [facility];  Service: Orthopedics;  Laterality: Right;  . US ECHOCARDIOGRAPHY  08/2009   SHOWED A MEAN GRADIENT ACROSS IS AORTIC VALVE OF 24 MM OF MERCURY. HE HAD MODERATE LVH AND NORMAL LV FUNCTION     Current Outpatient Medications  Medication Sig Dispense Refill  . allopurinol (ZYLOPRIM) 300 MG tablet Take 300 mg by mouth daily.      Marland Kitchen amLODipine (NORVASC) 10 MG tablet TAKE 1 TABLET BY MOUTH ONCE DAILY (Patient taking differently: Take 10 mg by mouth daily. ) 90 tablet 3  . aspirin EC 81 MG tablet Take 81 mg  by mouth at bedtime.     . Cholecalciferol (VITAMIN D3) 1000 UNITS CAPS Take 1 capsule by mouth at bedtime.     . famotidine (PEPCID) 20 MG tablet Take 20 mg by mouth daily.      . furosemide (LASIX) 20 MG tablet 40 mg daily for 5 days, followed by 20 mg daily. 30 tablet 0  . ketoconazole (NIZORAL) 2 % cream Apply 1 application topically daily as needed for irritation.    . Multiple Vitamins-Minerals (CENTRUM SILVER) tablet Take 1 tablet by mouth daily.      Marland Kitchen oxyCODONE-acetaminophen (PERCOCET/ROXICET) 5-325 MG tablet Take 1 tablet by mouth every 4 (four) hours as needed for moderate pain or severe pain. 20 tablet 0  . phenazopyridine (PYRIDIUM) 95 MG tablet Take 95 mg by mouth 3 (three) times daily as needed for pain (UTI symptoms).    . rosuvastatin (CRESTOR) 20 MG tablet Take 10 mg by mouth every evening.    . Sennosides (SENOKOT PO) Take 1 tablet by mouth daily as needed (constipation). FOR CONSTIPATION     . spironolactone  (ALDACTONE) 25 MG tablet TAKE 1 TABLET BY MOUTH ONCE DAILY. PLEASE KEEP UPCOMING APPT IN FEB FOR FUTURE REFILLS (Patient taking differently: Take 25 mg by mouth every evening. ) 90 tablet 3  . Tamsulosin HCl (FLOMAX) 0.4 MG CAPS Take 0.4 mg by mouth every evening.      No current facility-administered medications for this visit.     Allergies:   Ibuprofen    Social History:  The patient  reports that he quit smoking about 48 years ago. His smoking use included cigarettes. He has a 10.50 pack-year smoking history. He has never used smokeless tobacco. He reports current alcohol use. He reports that he does not use drugs.   Family History:  The patient's family history includes Heart attack in his father; Pneumonia (age of onset: 53) in his mother.    ROS:  General:no colds or fevers,  weight dowm 5 lbs from March. Skin:no rashes or ulcers HEENT:no blurred vision, no congestion CV:see HPI PUL:see HPI GI:no diarrhea constipation or melena, no indigestion GU:no hematuria, no dysuria MS:no joint pain, no claudication Neuro:no syncope, no lightheadedness Endo:no diabetes, no thyroid disease  Wt Readings from Last 3 Encounters:  05/05/19 244 lb 12.8 oz (111 kg)  04/24/19 246 lb 7.6 oz (111.8 kg)  02/14/19 248 lb 3.8 oz (112.6 kg)     PHYSICAL EXAM: VS:  BP 124/60   Pulse 87   Ht 5\' 10"  (1.778 m)   Wt 244 lb 12.8 oz (111 kg)   SpO2 95%   BMI 35.13 kg/m  , BMI Body mass index is 35.13 kg/m. General:Pleasant affect, NAD Skin:Warm and dry, brisk capillary refill HEENT:normocephalic, sclera clear, mucus membranes moist Neck:supple, no JVD, no bruits  Heart:S1S2 RRR with 1/6 systolic murmur, no gallup, rub or click Lungs:clear without rales, rhonchi, or wheezes GLO:VFIE, non tender, + BS, do not palpate liver spleen or masses Ext: rt lower ext edema of 1+ none on lt..  , 2+ pedal pulses, 2+ radial pulses Neuro:alert and oriented X 3, MAE, follows commands, + facial symmetry     EKG:  EKG is NOT ordered today. The ekg From the hospital demonstrated SR with RBBB and occ PVC no acute ST changes.     Recent Labs: 04/22/2019: ALT 20; B Natriuretic Peptide 813.2; TSH 6.407 04/24/2019: BUN 27; Creatinine, Ser 1.37; Hemoglobin 12.4; Platelets 83; Potassium 4.4; Sodium 139  Lipid Panel    Component Value Date/Time   CHOL 90 04/23/2019 0249   TRIG 79 04/23/2019 0249   HDL 32 (L) 04/23/2019 0249   CHOLHDL 2.8 04/23/2019 0249   VLDL 16 04/23/2019 0249   LDLCALC 42 04/23/2019 0249       Other studies Reviewed: Additional studies/ records that were reviewed today include: . Echo 04/23/19 IMPRESSIONS    1. The left ventricle has normal systolic function with an ejection fraction of 60-65%. The cavity size was normal. There is mildly increased left ventricular wall thickness. Left ventricular diastolic Doppler parameters are consistent with impaired  relaxation.  2. Left atrial size was mildly dilated.  3. The mitral valve is abnormal. Mild thickening of the mitral valve leaflet. There is mild mitral annular calcification present.  4. The tricuspid valve is grossly normal.  5. A bioprosthesis valve is present in the aortic position. Procedure Date: 2016.  6. The aortic root and ascending aorta are normal in size and structure.  7. The interatrial septum was not assessed.  FINDINGS  Left Ventricle: The left ventricle has normal systolic function, with an ejection fraction of 60-65%. The cavity size was normal. There is mildly increased left ventricular wall thickness. Left ventricular diastolic Doppler parameters are consistent  with impaired relaxation. Definity contrast agent was given IV to delineate the left ventricular endocardial borders.  Right Ventricle: The right ventricle was not well visualized. The cavity was not assessed. There is right vetricular wall thickness was not assessed.  Left Atrium: Left atrial size was mildly dilated.  Right Atrium:  Right atrial size was normal in size. Right atrial pressure is estimated at 10 mmHg.  Interatrial Septum: The interatrial septum was not assessed.  Pericardium: There is no evidence of pericardial effusion.  Mitral Valve: The mitral valve is abnormal. Mild thickening of the mitral valve leaflet. There is mild mitral annular calcification present. Mitral valve regurgitation is trivial by color flow Doppler.  Tricuspid Valve: The tricuspid valve is grossly normal. Tricuspid valve regurgitation is trivial by color flow Doppler.  Aortic Valve: The aortic valve has been repaired/replaced Aortic valve regurgitation is trivial by color flow Doppler. A unknown bioprosthetic aortic valve valve is present in the aortic position. Procedure Date: 2016.  Pulmonic Valve: The pulmonic valve was grossly normal. Pulmonic valve regurgitation is not visualized by color flow Doppler.  Aorta: The aortic root and ascending aorta are normal in size and structure.  Venous dopplers neg for DVT   ASSESSMENT AND PLAN:  1.  Acute CHF - diastolic - may be from increased salt and less activities along with PVCs.   His echo was stable.  He is now on lasix 20 mg daily, instead of HCTZ with 1+ Rt lower ext edema above fx but healing ankle.  He will monitor salt.  He is off BB and ACE  - BP currently stable and since discharge does have occ elevation to 161/59.    Would add BB back at lower dose for now but will check with Dr. Tamala Julian.  Follow up with Dr. Tamala Julian in 3 weeks.   2.  PVCs on monitor < 10% with HF did this add to his HF, and most likely needs BB.  Will defer to Dr. Tamala Julian if need for EP consult.  3.  TAVR in 2016 and stable on echo.  And normal EF   4.  CAD with hx CABG in 2003 and neg troponis no chest pain. Stable.  5.  HTN has been elevated, may add back bb.  6.  OSA with CPAP   Current medicines are reviewed with the patient today.  The patient Has no concerns regarding medicines.  The  following changes have been made:  See above Labs/ tests ordered today include:see above  Disposition:   FU:  see above  Signed, Cecilie Kicks, NP  05/05/2019 11:39 AM    [facility] [place], [place], [place], [place] Phone: [phone]; Fax: [phone]

## 2019-05-04 NOTE — Telephone Encounter (Signed)
    COVID-19 Pre-Screening Questions:  . In the past 7 to 10 days have you had a cough,  shortness of breath, headache, congestion, fever (100 or greater) body aches, chills, sore throat, or sudden loss of taste or sense of smell? . Have you been around anyone with known Covid 19. . Have you been around anyone who is awaiting Covid 19 test results in the past 7 to 10 days? . Have you been around anyone who has been exposed to Covid 19, or has mentioned symptoms of Covid 19 within the past 7 to 10 days?  If you have any concerns/questions about symptoms patients report during screening (either on the phone or at threshold). Contact the provider seeing the patient or DOD for further guidance.  If neither are available contact a member of the leadership team.            Contacted patient via phone call. Covid 19 questions were answered no. Has a mask. KB  

## 2019-05-05 ENCOUNTER — Ambulatory Visit (INDEPENDENT_AMBULATORY_CARE_PROVIDER_SITE_OTHER): Payer: Medicare Other | Admitting: Cardiology

## 2019-05-05 ENCOUNTER — Other Ambulatory Visit: Payer: Self-pay

## 2019-05-05 ENCOUNTER — Encounter: Payer: Self-pay | Admitting: Cardiology

## 2019-05-05 ENCOUNTER — Other Ambulatory Visit: Payer: Medicare Other | Admitting: *Deleted

## 2019-05-05 VITALS — BP 136/68 | HR 87 | Ht 70.0 in | Wt 244.8 lb

## 2019-05-05 DIAGNOSIS — I2510 Atherosclerotic heart disease of native coronary artery without angina pectoris: Secondary | ICD-10-CM

## 2019-05-05 DIAGNOSIS — I5033 Acute on chronic diastolic (congestive) heart failure: Secondary | ICD-10-CM | POA: Diagnosis not present

## 2019-05-05 DIAGNOSIS — I498 Other specified cardiac arrhythmias: Secondary | ICD-10-CM | POA: Diagnosis not present

## 2019-05-05 DIAGNOSIS — G4733 Obstructive sleep apnea (adult) (pediatric): Secondary | ICD-10-CM | POA: Diagnosis not present

## 2019-05-05 DIAGNOSIS — I6523 Occlusion and stenosis of bilateral carotid arteries: Secondary | ICD-10-CM

## 2019-05-05 DIAGNOSIS — I10 Essential (primary) hypertension: Secondary | ICD-10-CM | POA: Diagnosis not present

## 2019-05-05 DIAGNOSIS — I350 Nonrheumatic aortic (valve) stenosis: Secondary | ICD-10-CM

## 2019-05-05 NOTE — Patient Instructions (Addendum)
Medication Instructions:  Your physician recommends that you continue on your current medications as directed. Please refer to the Current Medication list given to you today.  If you need a refill on your cardiac medications before your next appointment, please call your pharmacy.   Lab work: TODAY:  BMET & CBC (previously ordered and scheduled)  If you have labs (blood work) drawn today and your tests are completely normal, you will receive your results only by: Marland Kitchen MyChart Message (if you have MyChart) OR . A paper copy in the mail If you have any lab test that is abnormal or we need to change your treatment, we will call you to review the results.  Testing/Procedures: None ordered  Follow-Up: At [facility], you and your health needs are our priority.  As part of our continuing mission to provide you with exceptional heart care, we have created designated Provider Care Teams.  These Care Teams include your primary Cardiologist (physician) and Advanced Practice Providers (APPs -  Physician Assistants and Nurse Practitioners) who all work together to provide you with the care you need, when you need it. . You will need a follow up appointment in 3 weeks with Dr. Tamala Julian, I will have someone call you with this appointment.  Any Other Special Instructions Will Be Listed Below (If Applicable).

## 2019-05-06 LAB — BASIC METABOLIC PANEL
BUN/Creatinine Ratio: 23 (ref 10–24)
BUN: 24 mg/dL (ref 8–27)
CO2: 24 mmol/L (ref 20–29)
Calcium: 9.6 mg/dL (ref 8.6–10.2)
Chloride: 102 mmol/L (ref 96–106)
Creatinine, Ser: 1.04 mg/dL (ref 0.76–1.27)
GFR calc Af Amer: 78 mL/min/{1.73_m2} (ref >59)
GFR calc non Af Amer: 67 mL/min/{1.73_m2} (ref >59)
Glucose: 94 mg/dL (ref 65–99)
Potassium: 4.5 mmol/L (ref 3.5–5.2)
Sodium: 139 mmol/L (ref 134–144)

## 2019-05-06 LAB — CBC
Hematocrit: 37.3 % — ABNORMAL LOW (ref 37.5–51.0)
Hemoglobin: 12.6 g/dL — ABNORMAL LOW (ref 13.0–17.7)
MCH: 31.1 pg (ref 26.6–33.0)
MCHC: 33.8 g/dL (ref 31.5–35.7)
MCV: 92 fL (ref 79–97)
Platelets: 136 10*3/uL — ABNORMAL LOW (ref 150–450)
RBC: 4.05 x10E6/uL — ABNORMAL LOW (ref 4.14–5.80)
RDW: 12.9 % (ref 11.6–15.4)
WBC: 5.9 10*3/uL (ref 3.4–10.8)

## 2019-05-24 NOTE — Progress Notes (Signed)
Cardiology Office Note:    Date:  05/25/2019   ID:  Andre Miranda, DOB Nov 16, 1936, MRN [id number]  PCP:  Crist Infante, MD  Cardiologist:  Sinclair Grooms, MD   Referring MD: Crist Infante, MD   Chief Complaint  Patient presents with  . Congestive Heart Failure  . Coronary Artery Disease    History of Present Illness:    Andre Miranda is a 83 y.o. male with a hx of  CAD s/p CABG2003and aortic stenosis s/p TAVR 2016 [place], hyperlipidemia, difficult to control essential hypertension,and recent hospital stay for acute on chronic diastolic HF.  Mild dyspnea on exertion especially first thing each morning.  Mild chest tightness if he walks too hard or too long.  Denies orthopnea, PND, and gross lower extremity edema.  Has had some palpitations.  He is either having difficulty with hearing or with his memory.    Past Medical History:  Diagnosis Date  . Aortic stenosis    MODERATE  . Cancer [facility])    prostate cancer-radiation  . Carotid arterial disease ([place])   . Coronary artery disease   . Dysuria   . GERD (gastroesophageal reflux disease)   . Gout   . Hematuria   . History of urinary retention   . Hyperlipidemia   . Hypertension   . Obesity   . Sleep apnea    uses CPAP nightly    Past Surgical History:  Procedure Laterality Date  . AORTIC VALVE REPAIR  February 06, 2015   Dr. Aline Brochure and Dr. Ysidro Evert  at [facility]  . [place]  02/2008  . CHOLECYSTECTOMY  04/2002  . CORONARY ARTERY BYPASS GRAFT  2003   LIMA TO THE LAD, RIMA TO THE RCA, AND A SAPHENOUS VEIN GRAFT TO THE INTERMEDIATE AND DISTAL LEFT CIRCUMFLEX  . ORIF ANKLE FRACTURE Right 02/14/2019   Procedure: OPEN REDUCTION INTERNAL FIXATION (ORIF) ANKLE FRACTURE;  Surgeon: Netta Cedars, MD;  Location: [facility];  Service: Orthopedics;  Laterality: Right;  . US ECHOCARDIOGRAPHY  08/2009   SHOWED A MEAN GRADIENT ACROSS IS AORTIC VALVE OF 24 MM OF MERCURY. HE HAD MODERATE LVH  AND NORMAL LV FUNCTION    Current Medications: Current Meds  Medication Sig  . allopurinol (ZYLOPRIM) 300 MG tablet Take 300 mg by mouth daily.    Marland Kitchen amLODipine (NORVASC) 10 MG tablet TAKE 1 TABLET BY MOUTH ONCE DAILY  . aspirin EC 81 MG tablet Take 81 mg by mouth at bedtime.   . Cholecalciferol (VITAMIN D3) 1000 UNITS CAPS Take 1 capsule by mouth at bedtime.   . famotidine (PEPCID) 20 MG tablet Take 20 mg by mouth daily.    Marland Kitchen ketoconazole (NIZORAL) 2 % cream Apply 1 application topically daily as needed for irritation.  . Multiple Vitamins-Minerals (CENTRUM SILVER) tablet Take 1 tablet by mouth daily.    . rosuvastatin (CRESTOR) 20 MG tablet Take 10 mg by mouth every evening.  . Sennosides (SENOKOT PO) Take 1 tablet by mouth daily as needed (constipation). FOR CONSTIPATION   . spironolactone (ALDACTONE) 25 MG tablet TAKE 1 TABLET BY MOUTH ONCE DAILY. PLEASE KEEP UPCOMING APPT IN FEB FOR FUTURE REFILLS  . Tamsulosin HCl (FLOMAX) 0.4 MG CAPS Take 0.4 mg by mouth every evening.   . [DISCONTINUED] furosemide (LASIX) 20 MG tablet 40 mg daily for 5 days, followed by 20 mg daily.     Allergies:   Ibuprofen   Social History   Socioeconomic History  . Marital  status: Married    Spouse name: Not on file  . Number of children: Not on file  . Years of education: Not on file  . Highest education level: Not on file  Occupational History  . Not on file  Social Needs  . Financial resource strain: Not on file  . Food insecurity    Worry: Not on file    Inability: Not on file  . Transportation needs    Medical: Not on file    Non-medical: Not on file  Tobacco Use  . Smoking status: Former Smoker    Packs/day: 1.50    Years: 7.00    Pack years: 10.50    Types: Cigarettes    Quit date: 04/03/1971    Years since quitting: 48.1  . Smokeless tobacco: Never Used  Substance and Sexual Activity  . Alcohol use: Yes    Comment: social  . Drug use: No  . Sexual activity: Not on file   Lifestyle  . Physical activity    Days per week: Not on file    Minutes per session: Not on file  . Stress: Not on file  Relationships  . Social Herbalist on phone: Not on file    Gets together: Not on file    Attends religious service: Not on file    Active member of club or organization: Not on file    Attends meetings of clubs or organizations: Not on file    Relationship status: Not on file  Other Topics Concern  . Not on file  Social History Narrative  . Not on file     Family History: The patient's family history includes Heart attack in his father; Pneumonia (age of onset: 18) in his mother.  ROS:   Please see the history of present illness.    Decreased memory All other systems reviewed and are negative.  EKGs/Labs/Other Studies Reviewed:    The following studies were reviewed today: Monitor 12/2018: Study Highlights   The predominant heart rhythm is normal sinus rhythm  Frequent predominantly isolated PVCs with occasional ventricular bigeminy and 3 beat runs. PVC burden approximately 10% over the 24-hour timeframe.  Rare PACs  Sinus bradycardia during sleep into the 40s.  No atrial fibrillation  No pauses greater than 3 seconds.   ECHOCARDIOGRAM 2020 IMPRESSIONS    1. The left ventricle has normal systolic function with an ejection fraction of 60-65%. The cavity size was normal. There is mildly increased left ventricular wall thickness. Left ventricular diastolic Doppler parameters are consistent with impaired  relaxation.  2. Left atrial size was mildly dilated.  3. The mitral valve is abnormal. Mild thickening of the mitral valve leaflet. There is mild mitral annular calcification present.  4. The tricuspid valve is grossly normal.  5. A bioprosthesis valve is present in the aortic position. Procedure Date: 2016.  6. The aortic root and ascending aorta are normal in size and structure.  7. The interatrial septum was not assessed.  EKG:   EKG not repeated  Recent Labs: 04/22/2019: ALT 20; B Natriuretic Peptide 813.2; TSH 6.407 05/05/2019: BUN 24; Creatinine, Ser 1.04; Hemoglobin 12.6; Platelets 136; Potassium 4.5; Sodium 139  Recent Lipid Panel    Component Value Date/Time   CHOL 90 04/23/2019 0249   TRIG 79 04/23/2019 0249   HDL 32 (L) 04/23/2019 0249   CHOLHDL 2.8 04/23/2019 0249   VLDL 16 04/23/2019 0249   LDLCALC 42 04/23/2019 0249    Physical Exam:  VS:  BP (!) 142/52   Pulse 78   Ht 5\' 10"  (1.778 m)   Wt 242 lb (109.8 kg)   SpO2 98%   BMI 34.72 kg/m     Wt Readings from Last 3 Encounters:  05/25/19 242 lb (109.8 kg)  05/05/19 244 lb 12.8 oz (111 kg)  04/24/19 246 lb 7.6 oz (111.8 kg)     GEN: Obese.. No acute distress HEENT: Normal NECK: No JVD. LYMPHATICS: No lymphadenopathy CARDIAC: RRR.  No  murmur, no gallop, no edema VASCULAR: 2+ radial Pulses, no bruits RESPIRATORY:  Clear to auscultation without rales, wheezing or rhonchi  ABDOMEN: Soft, non-tender, non-distended, No pulsatile mass, MUSCULOSKELETAL: No deformity  SKIN: Warm and dry NEUROLOGIC:  Alert and oriented x 3 PSYCHIATRIC:  Normal affect   ASSESSMENT:    1. Acute on chronic diastolic CHF (congestive heart failure) ([place])   2. Coronary artery disease involving native coronary artery of native heart without angina pectoris   3. Essential hypertension   4. Obstructive sleep apnea   5. Nonrheumatic aortic valve stenosis   6. Bilateral carotid artery occlusion   7. Other hyperlipidemia    PLAN:    In order of problems listed above:  1. Improved compared to June during hospital stay but still volume overloaded with trace lower extremity edema, elevated neck veins, and bibasilar crackles.  Increase furosemide to 40 mg/day.  Bmet in 1 week. 2. If he continues to have chest tightness with exertion with further reduction in volume, will need to consider ischemic evaluation/bypass graft occlusion 3. Better blood pressure control as  needed.  Target 130/80 mmHg.  Renal Doppler is pending. 4. He is compliant with CPAP 5. Systolic murmur is heard without auscultatory evidence of dysfunction. 6. Not discussed.  Secondary risk prevention is being utilized. 7. LDL target should be less than 70, with most recent of 42 on statin therapy.  I think he is a little volume overloaded.  Intensifies diuretic therapy.  If still having exertional dyspnea and chest tightness after increase in Lasix, consider ischemic evaluation to exclude bypass graft occlusion.  This should either be coronary angiography or myocardial perfusion imaging depending upon symptoms.  If still having predictable exertional angina, I would recommend coronary angiography as a definitive study.   Medication Adjustments/Labs and Tests Ordered: Current medicines are reviewed at length with the patient today.  Concerns regarding medicines are outlined above.  Orders Placed This Encounter  Procedures  . Basic metabolic panel   Meds ordered this encounter  Medications  . furosemide (LASIX) 40 MG tablet    Sig: Take 1 tablet (40 mg total) by mouth daily.    Dispense:  90 tablet    Refill:  3    Dose change    Patient Instructions  Medication Instructions:  1) INCREASE Furosemide to 40mg  once daily  If you need a refill on your cardiac medications before your next appointment, please call your pharmacy.   Lab work: Your physician recommends that you return for lab work in: 1 week (BMET)  If you have labs (blood work) drawn today and your tests are completely normal, you will receive your results only by: Marland Kitchen MyChart Message (if you have MyChart) OR . A paper copy in the mail If you have any lab test that is abnormal or we need to change your treatment, we will call you to review the results.  Testing/Procedures: None  Follow-Up: Your physician recommends that you schedule a follow-up appointment in: 3  months with NP on our team.   At [facility], you  and your health needs are our priority.  As part of our continuing mission to provide you with exceptional heart care, we have created designated Provider Care Teams.  These Care Teams include your primary Cardiologist (physician) and Advanced Practice Providers (APPs -  Physician Assistants and Nurse Practitioners) who all work together to provide you with the care you need, when you need it. You will need a follow up appointment in 6 months.  Please call our office 2 months in advance to schedule this appointment.  You may see Sinclair Grooms, MD or one of the following Advanced Practice Providers on your designated Care Team:   Truitt Merle, NP Cecilie Kicks, NP . Kathyrn Drown, NP  Any Other Special Instructions Will Be Listed Below (If Applicable).       Signed, Sinclair Grooms, MD  05/25/2019 11:48 AM    [place]

## 2019-05-25 ENCOUNTER — Ambulatory Visit (INDEPENDENT_AMBULATORY_CARE_PROVIDER_SITE_OTHER): Payer: Medicare Other | Admitting: Interventional Cardiology

## 2019-05-25 ENCOUNTER — Telehealth: Payer: Self-pay | Admitting: Interventional Cardiology

## 2019-05-25 ENCOUNTER — Other Ambulatory Visit: Payer: Self-pay

## 2019-05-25 ENCOUNTER — Encounter: Payer: Self-pay | Admitting: Interventional Cardiology

## 2019-05-25 VITALS — BP 142/52 | HR 78 | Ht 70.0 in | Wt 242.0 lb

## 2019-05-25 DIAGNOSIS — I10 Essential (primary) hypertension: Secondary | ICD-10-CM | POA: Diagnosis not present

## 2019-05-25 DIAGNOSIS — I350 Nonrheumatic aortic (valve) stenosis: Secondary | ICD-10-CM

## 2019-05-25 DIAGNOSIS — I6523 Occlusion and stenosis of bilateral carotid arteries: Secondary | ICD-10-CM | POA: Diagnosis not present

## 2019-05-25 DIAGNOSIS — E7849 Other hyperlipidemia: Secondary | ICD-10-CM | POA: Diagnosis not present

## 2019-05-25 DIAGNOSIS — I2510 Atherosclerotic heart disease of native coronary artery without angina pectoris: Secondary | ICD-10-CM

## 2019-05-25 DIAGNOSIS — G4733 Obstructive sleep apnea (adult) (pediatric): Secondary | ICD-10-CM

## 2019-05-25 DIAGNOSIS — I5033 Acute on chronic diastolic (congestive) heart failure: Secondary | ICD-10-CM | POA: Diagnosis not present

## 2019-05-25 MED ORDER — FUROSEMIDE 40 MG PO TABS: 40.0000 mg | ORAL_TABLET | Freq: Every day | ORAL | 3 refills | Status: DC

## 2019-05-25 NOTE — Telephone Encounter (Signed)

## 2019-05-25 NOTE — Patient Instructions (Signed)
Medication Instructions:  1) INCREASE Furosemide to 40mg  once daily  If you need a refill on your cardiac medications before your next appointment, please call your pharmacy.   Lab work: Your physician recommends that you return for lab work in: 1 week (BMET)  If you have labs (blood work) drawn today and your tests are completely normal, you will receive your results only by: Marland Kitchen MyChart Message (if you have MyChart) OR . A paper copy in the mail If you have any lab test that is abnormal or we need to change your treatment, we will call you to review the results.  Testing/Procedures: None  Follow-Up: Your physician recommends that you schedule a follow-up appointment in: 3 months with NP on our team.   At [facility], you and your health needs are our priority.  As part of our continuing mission to provide you with exceptional heart care, we have created designated Provider Care Teams.  These Care Teams include your primary Cardiologist (physician) and Advanced Practice Providers (APPs -  Physician Assistants and Nurse Practitioners) who all work together to provide you with the care you need, when you need it. You will need a follow up appointment in 6 months.  Please call our office 2 months in advance to schedule this appointment.  You may see Sinclair Grooms, MD or one of the following Advanced Practice Providers on your designated Care Team:   Truitt Merle, NP Cecilie Kicks, NP . Kathyrn Drown, NP  Any Other Special Instructions Will Be Listed Below (If Applicable).

## 2019-05-30 ENCOUNTER — Telehealth: Payer: Self-pay | Admitting: Interventional Cardiology

## 2019-05-30 NOTE — Telephone Encounter (Signed)
Spoke with the patient, he stated the increase of lasix has not helped. He did lose 2 lbs initially, but gained back 3.5 lbs. His foot swelling was slight and he is not SOB.

## 2019-05-30 NOTE — Telephone Encounter (Signed)
He is supposed to have BMET this week. Let us see result before any changes.

## 2019-05-30 NOTE — Telephone Encounter (Signed)
Spoke with the patient, he expressed understanding about keeping his lab appointment.

## 2019-05-30 NOTE — Telephone Encounter (Signed)
New Message    Pt c/o swelling: STAT is pt has developed SOB within 24 hours  1) How much weight have you gained and in what time span? 3.5lbs in 24 hours   2) If swelling, where is the swelling located? Little in feet  3) Are you currently taking a fluid pill? yes  4) Are you currently SOB? no  5) Do you have a log of your daily weights (if so, list)? 237, 237, 235, 239  6) Have you gained 3 pounds in a day or 5 pounds in a week? yes  7) Have you traveled recently? No  BP up slightly 179/73 Pulse 90

## 2019-05-31 DIAGNOSIS — M25571 Pain in right ankle and joints of right foot: Secondary | ICD-10-CM | POA: Diagnosis not present

## 2019-06-01 DIAGNOSIS — H2513 Age-related nuclear cataract, bilateral: Secondary | ICD-10-CM | POA: Diagnosis not present

## 2019-06-01 DIAGNOSIS — H31002 Unspecified chorioretinal scars, left eye: Secondary | ICD-10-CM | POA: Diagnosis not present

## 2019-06-01 DIAGNOSIS — Z1211 Encounter for screening for malignant neoplasm of colon: Secondary | ICD-10-CM | POA: Diagnosis not present

## 2019-06-02 ENCOUNTER — Other Ambulatory Visit: Payer: Self-pay

## 2019-06-02 ENCOUNTER — Other Ambulatory Visit: Payer: Medicare Other | Admitting: *Deleted

## 2019-06-02 DIAGNOSIS — I5033 Acute on chronic diastolic (congestive) heart failure: Secondary | ICD-10-CM

## 2019-06-02 LAB — BASIC METABOLIC PANEL
BUN/Creatinine Ratio: 19 (ref 10–24)
BUN: 22 mg/dL (ref 8–27)
CO2: 26 mmol/L (ref 20–29)
Calcium: 9.6 mg/dL (ref 8.6–10.2)
Chloride: 102 mmol/L (ref 96–106)
Creatinine, Ser: 1.13 mg/dL (ref 0.76–1.27)
GFR calc Af Amer: 71 mL/min/{1.73_m2} (ref >59)
GFR calc non Af Amer: 61 mL/min/{1.73_m2} (ref >59)
Glucose: 101 mg/dL — ABNORMAL HIGH (ref 65–99)
Potassium: 4.1 mmol/L (ref 3.5–5.2)
Sodium: 140 mmol/L (ref 134–144)

## 2019-06-10 ENCOUNTER — Telehealth: Payer: Self-pay | Admitting: Interventional Cardiology

## 2019-06-10 DIAGNOSIS — I10 Essential (primary) hypertension: Secondary | ICD-10-CM

## 2019-06-10 DIAGNOSIS — I5033 Acute on chronic diastolic (congestive) heart failure: Secondary | ICD-10-CM

## 2019-06-10 NOTE — Telephone Encounter (Signed)
The patient is calling to see if Dr Tamala Julian wanted to make any adjustments on his Lasix medication.  It was recently increased to 40 mg Daily.  His weight has been steady with no SOB.  His BP is still elevated at 164/64 average in the mornings with a "little chest tightness".  After he takes his morning medications, he feels better.    He will monitor his BP and symptoms over the weekend and would like to have [place] discuss Lasix.

## 2019-06-10 NOTE — Telephone Encounter (Signed)
New message  Patient is calling in to see if he still needs to increase the dosage of his furosemide (LASIX) 40 MG tablet based on his blood work results. Please give patient a call back.

## 2019-06-13 NOTE — Telephone Encounter (Signed)
Pt called Friday about BP remaining elevated.  Pt checks BP first thing in the morning prior to taking medication and they range from 155-166/59-65 with HR in 70s.  Legrand Como, RN advised pt to monitor BP over the weeks but to wait a few hrs after meds.  New readings are as follows:  Friday- 145/57, HR 68, Wt 233lbs Saturday- 151/55, HR 71, Wt 233lbs Sunday- 141/56, HR 67, Wt 234lbs Weight this morning was 234lbs.  Pt has slight chest tightness every morning that resolves quickly after getting up and moving and taking his meds.  Also occurs if he takes the trash to the road and comes back up his sloping driveway.  Resolves quickly with rest.  Denies SOB or actual pain, tightness doesn't radiate anywhere.  Advised I will send to Dr. Tamala Julian for review.

## 2019-06-13 NOTE — Telephone Encounter (Signed)
Increase Furosemide to 60 mg daily. Continue to monitor BP. BMET 10-14 days.

## 2019-06-14 MED ORDER — FUROSEMIDE 40 MG PO TABS: 60.0000 mg | ORAL_TABLET | Freq: Every day | ORAL | 3 refills | Status: DC

## 2019-06-14 NOTE — Telephone Encounter (Signed)
Spoke with pt and went over recommendations per Dr. Tamala Julian.  Pt verbalized understanding and was in agreement with plan.  He will come for labs on 8/10.

## 2019-06-27 ENCOUNTER — Other Ambulatory Visit: Payer: Self-pay

## 2019-06-27 ENCOUNTER — Other Ambulatory Visit: Payer: Medicare Other | Admitting: *Deleted

## 2019-06-27 DIAGNOSIS — I10 Essential (primary) hypertension: Secondary | ICD-10-CM | POA: Diagnosis not present

## 2019-06-27 DIAGNOSIS — I5033 Acute on chronic diastolic (congestive) heart failure: Secondary | ICD-10-CM

## 2019-06-27 LAB — BASIC METABOLIC PANEL
BUN/Creatinine Ratio: 23 (ref 10–24)
BUN: 25 mg/dL (ref 8–27)
CO2: 23 mmol/L (ref 20–29)
Calcium: 9.5 mg/dL (ref 8.6–10.2)
Chloride: 100 mmol/L (ref 96–106)
Creatinine, Ser: 1.1 mg/dL (ref 0.76–1.27)
GFR calc Af Amer: 73 mL/min/{1.73_m2} (ref >59)
GFR calc non Af Amer: 63 mL/min/{1.73_m2} (ref >59)
Glucose: 97 mg/dL (ref 65–99)
Potassium: 3.9 mmol/L (ref 3.5–5.2)
Sodium: 138 mmol/L (ref 134–144)

## 2019-06-28 DIAGNOSIS — C61 Malignant neoplasm of prostate: Secondary | ICD-10-CM | POA: Diagnosis not present

## 2019-07-04 DIAGNOSIS — N35011 Post-traumatic bulbous urethral stricture: Secondary | ICD-10-CM | POA: Diagnosis not present

## 2019-07-04 DIAGNOSIS — C61 Malignant neoplasm of prostate: Secondary | ICD-10-CM | POA: Diagnosis not present

## 2019-07-07 ENCOUNTER — Other Ambulatory Visit: Payer: Self-pay

## 2019-07-07 ENCOUNTER — Other Ambulatory Visit (HOSPITAL_COMMUNITY): Payer: Self-pay | Admitting: Interventional Cardiology

## 2019-07-07 ENCOUNTER — Ambulatory Visit (HOSPITAL_COMMUNITY)
Admission: RE | Admit: 2019-07-07 | Discharge: 2019-07-07 | Disposition: A | Discharge status: Home / Self Care | Payer: Medicare Other | Source: Ambulatory Visit | Attending: Cardiology | Admitting: Cardiology

## 2019-07-07 DIAGNOSIS — I701 Atherosclerosis of renal artery: Secondary | ICD-10-CM | POA: Diagnosis not present

## 2019-07-22 DIAGNOSIS — Z23 Encounter for immunization: Secondary | ICD-10-CM | POA: Diagnosis not present

## 2019-08-02 ENCOUNTER — Telehealth: Payer: Self-pay | Admitting: Interventional Cardiology

## 2019-08-02 MED ORDER — FUROSEMIDE 40 MG PO TABS: 60.0000 mg | ORAL_TABLET | Freq: Every day | ORAL | 3 refills | Status: DC

## 2019-08-02 NOTE — Telephone Encounter (Signed)
°  Pt c/o medication issue:  1. Name of Medication: furosemide (LASIX) 40 MG tablet  2. How are you currently taking this medication (dosage and times per day)? 60 mg (1.5 tablets) daily  3. Are you having a reaction (difficulty breathing--STAT)? no  4. What is your medication issue? Pt wanted to verify the dosage of the medication. On the bottle it says to take 40 mg daily, but he was recently told to take 60 mg daily. He just wants to make sure he is taking the correct dose of medicine

## 2019-08-02 NOTE — Telephone Encounter (Signed)
Spoke with pt and made him aware that he is suppose to be taking Furosemide 60mg  QD.  Pt recently picked up script and [facility] had refilled the old 40mg  QD prescription.  Advised I will resend 60mg  prescription with a note to d/c old prescription.  Pt appreciative for call.

## 2019-09-01 NOTE — Progress Notes (Signed)
CARDIOLOGY OFFICE NOTE  Date:  09/06/2019    Andre Miranda Date of Birth: 22-Oct-1936 Medical Record [id number]  PCP:  Crist Infante, MD  Cardiologist:  Tamala Julian  Chief Complaint  Patient presents with  . Follow-up    History of Present Illness: Andre Miranda is a 83 y.o. male who presents today for a 3 month check. Seen for Dr. Tamala Julian. He is a former patient of Dr. Susa Simmonds that I used to see.    He has a history of known CAD with prior CABG in 2003, aortic stenosis s/p TAVR 2016 at [facility], hyperlipidemia, difficult to control HTN, and chronic diastolic HF.  He last saw Dr. Tamala Julian back in July - had had recent admission for acute diastolic HF. Mild DOE first thing most mornings and mild chest tightness if walks too long/too hard. He was felt to still have volume overload on exam - diuretics were increased - possible ischemic evaluation if symptoms did not improve. Noted that there was also concern he was either hard of hearing or having memory issues.   The patient does not have symptoms concerning for COVID-19 infection (fever, chills, cough, or new shortness of breath).   Comes in today. Here alone. He has done a nice job with salt restriction now - says now he realizes how imperative that is to his well being. His weight is down. His chest tightness still occurs in the morning after waking up - he can have this later in the day if he exerts too long/too hard. He remains off beta blocker and ACE - he was on these prior to his admission back in June - with no symptoms - have not been restarted. He notes his heart is skipping again. His BP and HR are elevated. He can still hear his heart beat in his ears at times. Prior ankle fracture is healing. He is on some Restoril for sleep issues. He notes he is coming up on his 5 year anniversary for his TAVR.   Past Medical History:  Diagnosis Date  . Aortic stenosis    MODERATE  . Cancer [facility])    prostate cancer-radiation  .  Carotid arterial disease ([place])   . Coronary artery disease   . Dysuria   . GERD (gastroesophageal reflux disease)   . Gout   . Hematuria   . History of urinary retention   . Hyperlipidemia   . Hypertension   . Obesity   . Sleep apnea    uses CPAP nightly    Past Surgical History:  Procedure Laterality Date  . AORTIC VALVE REPAIR  February 06, 2015   Dr. Aline Brochure and Dr. Ysidro Evert  at [facility]  . [place]  02/2008  . CHOLECYSTECTOMY  04/2002  . CORONARY ARTERY BYPASS GRAFT  2003   LIMA TO THE LAD, RIMA TO THE RCA, AND A SAPHENOUS VEIN GRAFT TO THE INTERMEDIATE AND DISTAL LEFT CIRCUMFLEX  . ORIF ANKLE FRACTURE Right 02/14/2019   Procedure: OPEN REDUCTION INTERNAL FIXATION (ORIF) ANKLE FRACTURE;  Surgeon: Netta Cedars, MD;  Location: [place];  Service: Orthopedics;  Laterality: Right;  . US ECHOCARDIOGRAPHY  08/2009   SHOWED A MEAN GRADIENT ACROSS IS AORTIC VALVE OF 24 MM OF MERCURY. HE HAD MODERATE LVH AND NORMAL LV FUNCTION     Medications: Current Meds  Medication Sig  . allopurinol (ZYLOPRIM) 300 MG tablet Take 300 mg by mouth daily.    Marland Kitchen amLODipine (NORVASC) 10 MG tablet TAKE  1 TABLET BY MOUTH ONCE DAILY  . aspirin EC 81 MG tablet Take 81 mg by mouth at bedtime.   . Cholecalciferol (VITAMIN D3) 1000 UNITS CAPS Take 1 capsule by mouth at bedtime.   . famotidine (PEPCID) 20 MG tablet Take 20 mg by mouth daily.    . furosemide (LASIX) 40 MG tablet Take 1.5 tablets (60 mg total) by mouth daily.  Marland Kitchen ketoconazole (NIZORAL) 2 % cream Apply 1 application topically daily as needed for irritation.  . Multiple Vitamins-Minerals (CENTRUM SILVER) tablet Take 1 tablet by mouth daily.    . rosuvastatin (CRESTOR) 20 MG tablet Take 10 mg by mouth every evening.  . Sennosides (SENOKOT PO) Take 1 tablet by mouth daily as needed (constipation). FOR CONSTIPATION   . spironolactone (ALDACTONE) 25 MG tablet TAKE 1 TABLET BY MOUTH ONCE DAILY. PLEASE KEEP UPCOMING APPT  IN FEB FOR FUTURE REFILLS  . Tamsulosin HCl (FLOMAX) 0.4 MG CAPS Take 0.4 mg by mouth every evening.   . temazepam (RESTORIL) 30 MG capsule Take 30 mg by mouth at bedtime as needed for sleep.     Allergies: Allergies  Allergen Reactions  . Ibuprofen Hypertension    Social History: The patient  reports that he quit smoking about 48 years ago. His smoking use included cigarettes. He has a 10.50 pack-year smoking history. He has never used smokeless tobacco. He reports current alcohol use. He reports that he does not use drugs.   Family History: The patient's family history includes Heart attack in his father; Pneumonia (age of onset: 43) in his mother.   Review of Systems: Please see the history of present illness.   All other systems are reviewed and negative.   Physical Exam: VS:  BP (!) 144/72   Pulse 83   Ht 5\' 10"  (1.778 m)   Wt 232 lb 6.4 oz (105.4 kg)   SpO2 97%   BMI 33.35 kg/m  .  BMI Body mass index is 33.35 kg/m.  Wt Readings from Last 3 Encounters:  09/06/19 232 lb 6.4 oz (105.4 kg)  05/25/19 242 lb (109.8 kg)  05/05/19 244 lb 12.8 oz (111 kg)    General: Alert and in no acute distress. His weight is down 12 pounds since June.  HEENT: Normal.  Neck: Supple, no JVD, carotid bruits, or masses noted.  Cardiac: Regular rate and rhythm. Loud holosystolic murmur noted today.  No significant edema.  Respiratory:  Lungs are clear to auscultation bilaterally with normal work of breathing.  GI: Soft and nontender.  MS: No deformity or atrophy. Gait and ROM intact.  Skin: Warm and dry. Color is normal.  Neuro:  Strength and sensation are intact and no gross focal deficits noted.  Psych: Alert, appropriate and with normal affect.   LABORATORY DATA:  EKG:  EKG is not ordered today.  Lab Results  Component Value Date   WBC 5.9 05/05/2019   HGB 12.6 (L) 05/05/2019   HCT 37.3 (L) 05/05/2019   PLT 136 (L) 05/05/2019   GLUCOSE 97 06/27/2019   CHOL 90 04/23/2019    TRIG 79 04/23/2019   HDL 32 (L) 04/23/2019   LDLCALC 42 04/23/2019   ALT 20 04/22/2019   AST 24 04/22/2019   NA 138 06/27/2019   K 3.9 06/27/2019   CL 100 06/27/2019   CREATININE 1.10 06/27/2019   BUN 25 06/27/2019   CO2 23 06/27/2019   TSH 6.407 (H) 04/22/2019   INR 1.0 03/09/2008   HGBA1C 5.4 04/22/2019  BNP (last 3 results) Recent Labs    04/22/19 1504  BNP 813.2*    ProBNP (last 3 results) No results for input(s): PROBNP in the last 8760 hours.   Other Studies Reviewed Today:   ECHOCARDIOGRAM 04/2019 IMPRESSIONS  1. The left ventricle has normal systolic function with an ejection fraction of 60-65%. The cavity size was normal. There is mildly increased left ventricular wall thickness. Left ventricular diastolic Doppler parameters are consistent with impaired  relaxation. 2. Left atrial size was mildly dilated. 3. The mitral valve is abnormal. Mild thickening of the mitral valve leaflet. There is mild mitral annular calcification present. 4. The tricuspid valve is grossly normal. 5. A bioprosthesis valve is present in the aortic position. Procedure Date: 2016. 6. The aortic root and ascending aorta are normal in size and structure. 7. The interatrial septum was not assessed.  Monitor 12/2018: Study Highlights   The predominant heart rhythm is normal sinus rhythm  Frequent predominantly isolated PVCs with occasional ventricular bigeminy and 3 beat runs. PVC burden approximately 10% over the 24-hour timeframe.  Rare PACs  Sinus bradycardia during sleep into the 40s.  No atrial fibrillation  No pauses greater than 3 seconds.     Assessment/Plan:  1. Chest tightness with exertion - known CAD - remote CABG per [facility] in 2003 - I think we need to restart his medical therapy first - adding back Metoprolol 25 mg BID and lower dose ACE. Will see back in about 2 weeks - if symptoms persist - will discuss cardiac cath - he is agreeable to this plan. He  was not having any of these particular symptoms prior to the admission in June when he was on beta blockage.   2. Chronic diastolic HF - weight is down - seems more motivated to restrict his salt. BMET today.   3. AS with prior TAVR - loud holosystolic murmur on exam - restarting medicines today - see back - may need to get a limited study on return.   4. HTN - not controlled - adding back Lopressor and Altace - at lower doses today. BMET today and again on return.   5. HLD - on Crestor  6. Carotid disease - last study from January 2020 - has 1 to 39% bilateral disease noted.   7. OSA - not discussed.   8. COVID-19 Education: The signs and symptoms of COVID-19 were discussed with the patient and how to seek care for testing (follow up with PCP or arrange E-visit).  The importance of social distancing, staying at home, hand hygiene and wearing a mask when out in public were discussed today.  Current medicines are reviewed with the patient today.  The patient does not have concerns regarding medicines other than what has been noted above.  The following changes have been made:  See above.  Labs/ tests ordered today include:    Orders Placed This Encounter  Procedures  . Basic metabolic panel     Disposition:   FU with me in about 2 weeks.   Patient is agreeable to this plan and will call if any problems develop in the interim.   SignedTruitt Merle, NP  09/06/2019 10:54 AM  [facility] [address] [place], [place]  [address] Phone: [phone] Fax: [phone]

## 2019-09-06 ENCOUNTER — Ambulatory Visit (INDEPENDENT_AMBULATORY_CARE_PROVIDER_SITE_OTHER): Payer: Medicare Other | Admitting: Nurse Practitioner

## 2019-09-06 ENCOUNTER — Encounter: Payer: Self-pay | Admitting: Nurse Practitioner

## 2019-09-06 ENCOUNTER — Other Ambulatory Visit: Payer: Self-pay

## 2019-09-06 VITALS — BP 144/72 | HR 83 | Ht 70.0 in | Wt 232.4 lb

## 2019-09-06 DIAGNOSIS — I5032 Chronic diastolic (congestive) heart failure: Secondary | ICD-10-CM

## 2019-09-06 DIAGNOSIS — I2510 Atherosclerotic heart disease of native coronary artery without angina pectoris: Secondary | ICD-10-CM

## 2019-09-06 DIAGNOSIS — Z952 Presence of prosthetic heart valve: Secondary | ICD-10-CM | POA: Diagnosis not present

## 2019-09-06 DIAGNOSIS — I10 Essential (primary) hypertension: Secondary | ICD-10-CM | POA: Diagnosis not present

## 2019-09-06 DIAGNOSIS — I493 Ventricular premature depolarization: Secondary | ICD-10-CM | POA: Diagnosis not present

## 2019-09-06 DIAGNOSIS — E7849 Other hyperlipidemia: Secondary | ICD-10-CM

## 2019-09-06 DIAGNOSIS — I701 Atherosclerosis of renal artery: Secondary | ICD-10-CM

## 2019-09-06 DIAGNOSIS — Z7189 Other specified counseling: Secondary | ICD-10-CM | POA: Diagnosis not present

## 2019-09-06 LAB — BASIC METABOLIC PANEL
BUN/Creatinine Ratio: 24 (ref 10–24)
BUN: 29 mg/dL — ABNORMAL HIGH (ref 8–27)
CO2: 23 mmol/L (ref 20–29)
Calcium: 9.4 mg/dL (ref 8.6–10.2)
Chloride: 101 mmol/L (ref 96–106)
Creatinine, Ser: 1.23 mg/dL (ref 0.76–1.27)
GFR calc Af Amer: 63 mL/min/{1.73_m2} (ref >59)
GFR calc non Af Amer: 55 mL/min/{1.73_m2} — ABNORMAL LOW (ref >59)
Glucose: 94 mg/dL (ref 65–99)
Potassium: 4 mmol/L (ref 3.5–5.2)
Sodium: 138 mmol/L (ref 134–144)

## 2019-09-06 MED ORDER — RAMIPRIL 5 MG PO CAPS: 5.0000 mg | ORAL_CAPSULE | Freq: Every day | ORAL | 3 refills | Status: DC

## 2019-09-06 MED ORDER — METOPROLOL TARTRATE 25 MG PO TABS: 25.0000 mg | ORAL_TABLET | Freq: Two times a day (BID) | ORAL | 3 refills | Status: DC

## 2019-09-06 NOTE — Patient Instructions (Addendum)
After Visit Summary:  We will be checking the following labs today - BMET   Medication Instructions:    Continue with your current medicines. BUT  I am adding back Lopressor but at a lower dose - start 25 mg to take twice a day  I am restarting the Altace but at 5 mg a day   If you need a refill on your cardiac medications before your next appointment, please call your pharmacy.     Testing/Procedures To Be Arranged:  N/A  Follow-Up:   See me in about 2 weeks    At [facility], you and your health needs are our priority.  As part of our continuing mission to provide you with exceptional heart care, we have created designated Provider Care Teams.  These Care Teams include your primary Cardiologist (physician) and Advanced Practice Providers (APPs -  Physician Assistants and Nurse Practitioners) who all work together to provide you with the care you need, when you need it.  Special Instructions:  . Stay safe, stay home, wash your hands for at least 20 seconds and wear a mask when out in public.  . It was good to talk with you today.  . Start a new BP and HR diary.  Marland Kitchen Keep up the good job with salt restriction   Call the [place] office at [phone] if you have any questions, problems or concerns.

## 2019-09-10 ENCOUNTER — Inpatient Hospital Stay (HOSPITAL_COMMUNITY)
Admission: EM | Admit: 2019-09-10 | Discharge: 2019-09-12 | DRG: 315 | Disposition: A | Discharge status: Home / Self Care | Payer: Medicare Other | Attending: Internal Medicine | Admitting: Internal Medicine

## 2019-09-10 ENCOUNTER — Other Ambulatory Visit: Payer: Self-pay

## 2019-09-10 ENCOUNTER — Emergency Department (HOSPITAL_COMMUNITY): Payer: Medicare Other

## 2019-09-10 ENCOUNTER — Encounter (HOSPITAL_COMMUNITY): Payer: Self-pay | Admitting: Emergency Medicine

## 2019-09-10 DIAGNOSIS — Z953 Presence of xenogenic heart valve: Secondary | ICD-10-CM

## 2019-09-10 DIAGNOSIS — Z923 Personal history of irradiation: Secondary | ICD-10-CM

## 2019-09-10 DIAGNOSIS — G4733 Obstructive sleep apnea (adult) (pediatric): Secondary | ICD-10-CM | POA: Diagnosis present

## 2019-09-10 DIAGNOSIS — I5032 Chronic diastolic (congestive) heart failure: Secondary | ICD-10-CM | POA: Diagnosis not present

## 2019-09-10 DIAGNOSIS — Z6833 Body mass index (BMI) 33.0-33.9, adult: Secondary | ICD-10-CM

## 2019-09-10 DIAGNOSIS — D693 Immune thrombocytopenic purpura: Secondary | ICD-10-CM

## 2019-09-10 DIAGNOSIS — R0789 Other chest pain: Secondary | ICD-10-CM | POA: Diagnosis not present

## 2019-09-10 DIAGNOSIS — Z9049 Acquired absence of other specified parts of digestive tract: Secondary | ICD-10-CM

## 2019-09-10 DIAGNOSIS — N179 Acute kidney failure, unspecified: Secondary | ICD-10-CM | POA: Diagnosis not present

## 2019-09-10 DIAGNOSIS — Z7982 Long term (current) use of aspirin: Secondary | ICD-10-CM

## 2019-09-10 DIAGNOSIS — Z952 Presence of prosthetic heart valve: Secondary | ICD-10-CM | POA: Diagnosis not present

## 2019-09-10 DIAGNOSIS — I959 Hypotension, unspecified: Secondary | ICD-10-CM | POA: Diagnosis not present

## 2019-09-10 DIAGNOSIS — D696 Thrombocytopenia, unspecified: Secondary | ICD-10-CM | POA: Diagnosis present

## 2019-09-10 DIAGNOSIS — I509 Heart failure, unspecified: Secondary | ICD-10-CM | POA: Diagnosis not present

## 2019-09-10 DIAGNOSIS — Z20828 Contact with and (suspected) exposure to other viral communicable diseases: Secondary | ICD-10-CM | POA: Diagnosis not present

## 2019-09-10 DIAGNOSIS — T8203XA Leakage of heart valve prosthesis, initial encounter: Secondary | ICD-10-CM | POA: Diagnosis not present

## 2019-09-10 DIAGNOSIS — I110 Hypertensive heart disease with heart failure: Secondary | ICD-10-CM | POA: Diagnosis not present

## 2019-09-10 DIAGNOSIS — Z87891 Personal history of nicotine dependence: Secondary | ICD-10-CM

## 2019-09-10 DIAGNOSIS — R55 Syncope and collapse: Secondary | ICD-10-CM

## 2019-09-10 DIAGNOSIS — R079 Chest pain, unspecified: Secondary | ICD-10-CM | POA: Diagnosis not present

## 2019-09-10 DIAGNOSIS — Z886 Allergy status to analgesic agent status: Secondary | ICD-10-CM

## 2019-09-10 DIAGNOSIS — I4510 Unspecified right bundle-branch block: Secondary | ICD-10-CM | POA: Diagnosis not present

## 2019-09-10 DIAGNOSIS — Z8249 Family history of ischemic heart disease and other diseases of the circulatory system: Secondary | ICD-10-CM

## 2019-09-10 DIAGNOSIS — Z9889 Other specified postprocedural states: Secondary | ICD-10-CM

## 2019-09-10 DIAGNOSIS — Y831 Surgical operation with implant of artificial internal device as the cause of abnormal reaction of the patient, or of later complication, without mention of misadventure at the time of the procedure: Secondary | ICD-10-CM | POA: Diagnosis present

## 2019-09-10 DIAGNOSIS — Y712 Prosthetic and other implants, materials and accessory cardiovascular devices associated with adverse incidents: Secondary | ICD-10-CM | POA: Diagnosis present

## 2019-09-10 DIAGNOSIS — G4700 Insomnia, unspecified: Secondary | ICD-10-CM | POA: Diagnosis not present

## 2019-09-10 DIAGNOSIS — E785 Hyperlipidemia, unspecified: Secondary | ICD-10-CM | POA: Diagnosis present

## 2019-09-10 DIAGNOSIS — Z8546 Personal history of malignant neoplasm of prostate: Secondary | ICD-10-CM | POA: Diagnosis not present

## 2019-09-10 DIAGNOSIS — I10 Essential (primary) hypertension: Secondary | ICD-10-CM

## 2019-09-10 DIAGNOSIS — M109 Gout, unspecified: Secondary | ICD-10-CM | POA: Diagnosis present

## 2019-09-10 DIAGNOSIS — E669 Obesity, unspecified: Secondary | ICD-10-CM | POA: Diagnosis present

## 2019-09-10 DIAGNOSIS — J811 Chronic pulmonary edema: Secondary | ICD-10-CM | POA: Diagnosis not present

## 2019-09-10 DIAGNOSIS — I25118 Atherosclerotic heart disease of native coronary artery with other forms of angina pectoris: Secondary | ICD-10-CM | POA: Diagnosis present

## 2019-09-10 DIAGNOSIS — I350 Nonrheumatic aortic (valve) stenosis: Secondary | ICD-10-CM

## 2019-09-10 DIAGNOSIS — Z9989 Dependence on other enabling machines and devices: Secondary | ICD-10-CM

## 2019-09-10 DIAGNOSIS — Z79899 Other long term (current) drug therapy: Secondary | ICD-10-CM

## 2019-09-10 DIAGNOSIS — Z951 Presence of aortocoronary bypass graft: Secondary | ICD-10-CM | POA: Diagnosis not present

## 2019-09-10 DIAGNOSIS — I440 Atrioventricular block, first degree: Secondary | ICD-10-CM | POA: Diagnosis present

## 2019-09-10 DIAGNOSIS — K219 Gastro-esophageal reflux disease without esophagitis: Secondary | ICD-10-CM | POA: Diagnosis not present

## 2019-09-10 DIAGNOSIS — D649 Anemia, unspecified: Secondary | ICD-10-CM | POA: Diagnosis present

## 2019-09-10 DIAGNOSIS — I491 Atrial premature depolarization: Secondary | ICD-10-CM | POA: Diagnosis not present

## 2019-09-10 DIAGNOSIS — Z03818 Encounter for observation for suspected exposure to other biological agents ruled out: Secondary | ICD-10-CM | POA: Diagnosis not present

## 2019-09-10 LAB — COMPREHENSIVE METABOLIC PANEL
ALT: 19 U/L (ref 0–44)
AST: 27 U/L (ref 15–41)
Albumin: 3.8 g/dL (ref 3.5–5.0)
Alkaline Phosphatase: 80 U/L (ref 38–126)
Anion gap: 10 (ref 5–15)
BUN: 33 mg/dL — ABNORMAL HIGH (ref 8–23)
CO2: 20 mmol/L — ABNORMAL LOW (ref 22–32)
Calcium: 9.1 mg/dL (ref 8.9–10.3)
Chloride: 103 mmol/L (ref 98–111)
Creatinine, Ser: 1.29 mg/dL — ABNORMAL HIGH (ref 0.61–1.24)
GFR calc Af Amer: 60 mL/min — ABNORMAL LOW (ref >60)
GFR calc non Af Amer: 52 mL/min — ABNORMAL LOW (ref >60)
Glucose, Bld: 137 mg/dL — ABNORMAL HIGH (ref 70–99)
Potassium: 4.5 mmol/L (ref 3.5–5.1)
Sodium: 133 mmol/L — ABNORMAL LOW (ref 135–145)
Total Bilirubin: 1.5 mg/dL — ABNORMAL HIGH (ref 0.3–1.2)
Total Protein: 6.2 g/dL — ABNORMAL LOW (ref 6.5–8.1)

## 2019-09-10 LAB — CBC
HCT: 39.1 % (ref 39.0–52.0)
Hemoglobin: 12.6 g/dL — ABNORMAL LOW (ref 13.0–17.0)
MCH: 30.7 pg (ref 26.0–34.0)
MCHC: 32.2 g/dL (ref 30.0–36.0)
MCV: 95.4 fL (ref 80.0–100.0)
Platelets: 104 10*3/uL — ABNORMAL LOW (ref 150–400)
RBC: 4.1 MIL/uL — ABNORMAL LOW (ref 4.22–5.81)
RDW: 14.2 % (ref 11.5–15.5)
WBC: 8.1 10*3/uL (ref 4.0–10.5)
nRBC: 0 % (ref 0.0–0.2)

## 2019-09-10 LAB — BRAIN NATRIURETIC PEPTIDE: B Natriuretic Peptide: 584.6 pg/mL — ABNORMAL HIGH (ref 0.0–100.0)

## 2019-09-10 LAB — TROPONIN I (HIGH SENSITIVITY)
Troponin I (High Sensitivity): 55 ng/L — ABNORMAL HIGH (ref <18)
Troponin I (High Sensitivity): 64 ng/L — ABNORMAL HIGH (ref <18)

## 2019-09-10 LAB — CBG MONITORING, ED: Glucose-Capillary: 134 mg/dL — ABNORMAL HIGH (ref 70–99)

## 2019-09-10 MED ORDER — TEMAZEPAM 15 MG PO CAPS
30.0000 mg | ORAL_CAPSULE | Freq: Every evening | ORAL | Status: DC | PRN
  Administered 2019-09-11: 30 mg via ORAL
  Filled 2019-09-10: qty 2

## 2019-09-10 MED ORDER — METOPROLOL TARTRATE 25 MG PO TABS
25.0000 mg | ORAL_TABLET | Freq: Two times a day (BID) | ORAL | Status: DC
  Administered 2019-09-10 – 2019-09-12 (×4): 25 mg via ORAL
  Filled 2019-09-10 (×4): qty 1

## 2019-09-10 MED ORDER — ROSUVASTATIN CALCIUM 5 MG PO TABS
10.0000 mg | ORAL_TABLET | Freq: Every evening | ORAL | Status: DC
  Administered 2019-09-11: 10 mg via ORAL
  Filled 2019-09-10: qty 2

## 2019-09-10 MED ORDER — ALLOPURINOL 300 MG PO TABS
300.0000 mg | ORAL_TABLET | Freq: Every day | ORAL | Status: DC
  Administered 2019-09-11 – 2019-09-12 (×2): 300 mg via ORAL
  Filled 2019-09-10 (×3): qty 1

## 2019-09-10 MED ORDER — FUROSEMIDE 10 MG/ML IJ SOLN
40.0000 mg | Freq: Once | INTRAMUSCULAR | Status: AC
  Administered 2019-09-10: 22:00:00 40 mg via INTRAVENOUS
  Filled 2019-09-10: qty 4

## 2019-09-10 MED ORDER — ENOXAPARIN SODIUM 40 MG/0.4ML ~~LOC~~ SOLN
40.0000 mg | Freq: Every day | SUBCUTANEOUS | Status: DC
  Administered 2019-09-11 – 2019-09-12 (×2): 40 mg via SUBCUTANEOUS
  Filled 2019-09-10 (×3): qty 0.4

## 2019-09-10 MED ORDER — SPIRONOLACTONE 25 MG PO TABS
25.0000 mg | ORAL_TABLET | Freq: Every day | ORAL | Status: DC
  Administered 2019-09-11 – 2019-09-12 (×2): 25 mg via ORAL
  Filled 2019-09-10 (×3): qty 1

## 2019-09-10 MED ORDER — ASPIRIN EC 81 MG PO TBEC
81.0000 mg | DELAYED_RELEASE_TABLET | Freq: Every day | ORAL | Status: DC
  Administered 2019-09-11: 22:00:00 81 mg via ORAL
  Filled 2019-09-10 (×2): qty 1

## 2019-09-10 MED ORDER — TAMSULOSIN HCL 0.4 MG PO CAPS
0.4000 mg | ORAL_CAPSULE | Freq: Every evening | ORAL | Status: DC
  Administered 2019-09-11: 17:00:00 0.4 mg via ORAL
  Filled 2019-09-10: qty 1

## 2019-09-10 MED ORDER — FUROSEMIDE 40 MG PO TABS
60.0000 mg | ORAL_TABLET | Freq: Every day | ORAL | Status: DC
  Administered 2019-09-11 – 2019-09-12 (×2): 60 mg via ORAL
  Filled 2019-09-10 (×2): qty 1

## 2019-09-10 MED ORDER — AMLODIPINE BESYLATE 10 MG PO TABS
10.0000 mg | ORAL_TABLET | Freq: Every day | ORAL | Status: DC
  Administered 2019-09-11 – 2019-09-12 (×2): 10 mg via ORAL
  Filled 2019-09-10 (×2): qty 1

## 2019-09-10 MED ORDER — FAMOTIDINE 20 MG PO TABS
20.0000 mg | ORAL_TABLET | Freq: Every day | ORAL | Status: DC
  Administered 2019-09-11 – 2019-09-12 (×2): 20 mg via ORAL
  Filled 2019-09-10 (×2): qty 1

## 2019-09-10 NOTE — ED Provider Notes (Signed)
[facility]ER[facility] DEPARTMENT Provider Note   CSN: [id number] Arrival date & time: 09/10/19  1921     History   Chief Complaint Chief Complaint  Patient presents with   Loss of Consciousness    HPI GRAYLIN CALANDRINO is a 83 y.o. male.     Patient is an 83 year old male with a history of CAD status post CABG, aortic stenosis status post valve repair 5 years ago with bovine valve, CHF, hypertension who is presenting today with near versus complete syncope.  Patient states in June he was hospitalized for CHF, shortness of breath, chest discomfort and an episode of syncope.  At that time he was diuresed and lost 12 pounds.  Also at that time his metoprolol, ramipril and hydrochlorothiazide were discontinued.  He continues to take 60 mg of Lasix per day.  He states he saw his cardiology PA last week and at that time she restarted half of the dose of metoprolol and ramipril due to patient being hypertensive in the office.  He is also had intermittent issues with exertional chest discomfort and shortness of breath since June.  Patient denies any recent weight gain but feels like the shortness of breath with exertion may be getting slightly worse.  Today he was having a normal day he went and picked up some food for this evening and he was standing in his kitchen when his wife noticed him starting to sway and he states he suddenly felt lightheaded like he may pass out.  His wife helped him to the floor and he says he started feeling better and does not think he fully lost consciousness.  He denied any chest pain, shortness of breath, palpitations prior to the event and states it came out of nowhere.  He denies any diaphoresis or feeling warm but his wife remarked that he seemed pale all day long.  He denies any black stool or urinary difficulties.  He has been having ongoing issues with sleeping since March and states he always feels like he is in a fog. Based on patient's  cardiology appointment last week he was supposed to follow-up in 3 weeks and then they were going to discuss whether he would be getting further provocative testing or catheterization to further evaluate the chest discomfort he has with exertion.  The history is provided by the patient.  Loss of Consciousness Episode history:  Single Most recent episode:  Today Timing:  Constant Progression:  Resolved Chronicity:  New Context comment:  Occurred while standing. Witnessed: yes   Relieved by:  Lying down Worsened by:  Nothing Ineffective treatments:  None tried Associated symptoms: no chest pain, no difficulty breathing, no focal weakness, no headaches, no nausea, no palpitations, no shortness of breath and no vomiting   Risk factors: coronary artery disease     Past Medical History:  Diagnosis Date   Aortic stenosis    MODERATE   Cancer ([place])    prostate cancer-radiation   Carotid arterial disease (H[facility])    Coronary artery disease    Dysuria    GERD (gastroesophageal reflux disease)    Gout    Hematuria    History of urinary retention    Hyperlipidemia    Hypertension    Obesity    Sleep apnea    uses CPAP nightly    Patient Active Problem List   Diagnosis Date Noted   Acute on chronic diastolic CHF (congestive heart failure) ([place]) 04/22/2019   S/P TAVR (transcatheter aortic  valve replacement) 07/27/2017   Accelerated hypertension 07/27/2017   Right-sided chest wall pain 09/28/2014   Occlusion and stenosis of carotid artery without mention of cerebral infarction 03/28/2014   Acute bronchitis with asthma 08/25/2012   Multiple lung nodules 06/20/2012   Hyperlipidemia 10/02/2011   CAD (coronary artery disease) 04/04/2011   Hypertension    Obesity    Dysuria    Hematuria    Aortic stenosis    Obstructive sleep apnea    Gout    Carotid arterial disease [facility])     Past Surgical History:  Procedure Laterality Date   AORTIC VALVE REPAIR   February 06, 2015   Dr. Aline Brochure and Dr. Ysidro Evert  at [facility]  02/2008   CHOLECYSTECTOMY  04/2002   CORONARY ARTERY BYPASS GRAFT  2003   LIMA TO THE LAD, RIMA TO THE RCA, AND A SAPHENOUS VEIN GRAFT TO THE INTERMEDIATE AND DISTAL LEFT CIRCUMFLEX   ORIF ANKLE FRACTURE Right 02/14/2019   Procedure: OPEN REDUCTION INTERNAL FIXATION (ORIF) ANKLE FRACTURE;  Surgeon: Netta Cedars, MD;  Location: [facility];  Service: Orthopedics;  Laterality: Right;   US ECHOCARDIOGRAPHY  08/2009   SHOWED A MEAN GRADIENT ACROSS IS AORTIC VALVE OF 24 MM OF MERCURY. HE HAD MODERATE LVH AND NORMAL LV FUNCTION        Home Medications    Prior to Admission medications   Medication Sig Start Date End Date Taking? Authorizing Provider  allopurinol (ZYLOPRIM) 300 MG tablet Take 300 mg by mouth daily.      [provider]  amLODipine (NORVASC) 10 MG tablet TAKE 1 TABLET BY MOUTH ONCE DAILY 12/29/18   Belva Crome, MD  aspirin EC 81 MG tablet Take 81 mg by mouth at bedtime.  11/23/12   Larey Dresser, MD  Cholecalciferol (VITAMIN D3) 1000 UNITS CAPS Take 1 capsule by mouth at bedtime.     [provider]  famotidine (PEPCID) 20 MG tablet Take 20 mg by mouth daily.      [provider]  furosemide (LASIX) 40 MG tablet Take 1.5 tablets (60 mg total) by mouth daily. 08/02/19   Belva Crome, MD  ketoconazole (NIZORAL) 2 % cream Apply 1 application topically daily as needed for irritation. 02/04/17   [provider]  metoprolol tartrate (LOPRESSOR) 25 MG tablet Take 1 tablet (25 mg total) by mouth 2 (two) times daily. 09/06/19 12/05/19  Burtis Junes, NP  Multiple Vitamins-Minerals (CENTRUM SILVER) tablet Take 1 tablet by mouth daily.      [provider]  ramipril (ALTACE) 5 MG capsule Take 1 capsule (5 mg total) by mouth daily. 09/06/19 12/05/19  Burtis Junes, NP  rosuvastatin (CRESTOR) 20 MG tablet Take 10 mg by mouth every  evening.    [provider]  Sennosides (SENOKOT PO) Take 1 tablet by mouth daily as needed (constipation). FOR CONSTIPATION     [provider]  spironolactone (ALDACTONE) 25 MG tablet TAKE 1 TABLET BY MOUTH ONCE DAILY. PLEASE KEEP UPCOMING APPT IN FEB FOR FUTURE REFILLS 01/24/19   Belva Crome, MD  Tamsulosin HCl (FLOMAX) 0.4 MG CAPS Take 0.4 mg by mouth every evening.     [provider]  temazepam (RESTORIL) 30 MG capsule Take 30 mg by mouth at bedtime as needed for sleep.    [provider]    Family History Family History  Problem Relation Age of Onset   Heart attack Father  Pneumonia Mother 45    Social History Social History   Tobacco Use   Smoking status: Former Smoker    Packs/day: 1.50    Years: 7.00    Pack years: 10.50    Types: Cigarettes    Quit date: 04/03/1971    Years since quitting: 48.4   Smokeless tobacco: Never Used  Substance Use Topics   Alcohol use: Yes    Comment: social   Drug use: No     Allergies   Ibuprofen   Review of Systems Review of Systems  Respiratory: Negative for shortness of breath.   Cardiovascular: Positive for syncope. Negative for chest pain and palpitations.  Gastrointestinal: Negative for nausea and vomiting.  Neurological: Negative for focal weakness and headaches.  All other systems reviewed and are negative.    Physical Exam Updated Vital Signs BP (!) 128/50    Pulse 61    Temp 97.7 F (36.5 C) (Oral)    Resp (!) 21    Ht 5\' 10"  (1.778 m)    Wt 105.4 kg    SpO2 94%    BMI 33.35 kg/m   Physical Exam Vitals signs and nursing note reviewed.  Constitutional:      General: He is not in acute distress.    Appearance: He is well-developed. He is obese.  HENT:     Head: Normocephalic and atraumatic.     Mouth/Throat:     Mouth: Mucous membranes are moist.  Eyes:     Conjunctiva/sclera: Conjunctivae normal.     Pupils: Pupils are equal, round, and reactive to light.    Neck:     Musculoskeletal: Normal range of motion and neck supple.  Cardiovascular:     Rate and Rhythm: Normal rate and regular rhythm.     Pulses: Normal pulses.     Heart sounds: No murmur.     Comments: Prominent S1 click Pulmonary:     Effort: Pulmonary effort is normal. No respiratory distress.     Breath sounds: Examination of the right-lower field reveals rales. Examination of the left-lower field reveals rales. Rales present. No wheezing.  Abdominal:     General: There is no distension.     Palpations: Abdomen is soft.     Tenderness: There is no abdominal tenderness. There is no guarding or rebound.  Musculoskeletal: Normal range of motion.        General: No tenderness.     Right lower leg: 1+ Pitting Edema present.     Left lower leg: 1+ Pitting Edema present.  Skin:    General: Skin is warm and dry.     Findings: No erythema or rash.  Neurological:     General: No focal deficit present.     Mental Status: He is alert and oriented to person, place, and time. Mental status is at baseline.  Psychiatric:        Mood and Affect: Mood normal.        Behavior: Behavior normal.        Thought Content: Thought content normal.      ED Treatments / Results  Labs (all labs ordered are listed, but only abnormal results are displayed) Labs Reviewed  CBC - Abnormal; Notable for the following components:      Result Value   RBC 4.10 (*)    Hemoglobin 12.6 (*)    Platelets 104 (*)    All other components within normal limits  COMPREHENSIVE METABOLIC PANEL - Abnormal; Notable for the following  components:   Sodium 133 (*)    CO2 20 (*)    Glucose, Bld 137 (*)    BUN 33 (*)    Creatinine, Ser 1.29 (*)    Total Protein 6.2 (*)    Total Bilirubin 1.5 (*)    GFR calc non Af Amer 52 (*)    GFR calc Af Amer 60 (*)    All other components within normal limits  BRAIN NATRIURETIC PEPTIDE - Abnormal; Notable for the following components:   B Natriuretic Peptide 584.6 (*)     All other components within normal limits  CBG MONITORING, ED - Abnormal; Notable for the following components:   Glucose-Capillary 134 (*)    All other components within normal limits  TROPONIN I (HIGH SENSITIVITY) - Abnormal; Notable for the following components:   Troponin I (High Sensitivity) 55 (*)    All other components within normal limits  SARS CORONAVIRUS 2 (TAT 6-24 HRS)  URINALYSIS, ROUTINE W REFLEX MICROSCOPIC  TROPONIN I (HIGH SENSITIVITY)    EKG None  ED ECG REPORT   Date: 09/10/2019  Rate: 61  Rhythm: normal sinus rhythm  QRS Axis: normal  Intervals: PR prolonged  ST/T Wave abnormalities: nonspecific ST/T changes  Conduction Disutrbances:right bundle branch block  Narrative Interpretation:   Old EKG Reviewed: unchanged  I have personally reviewed the EKG tracing and agree with the computerized printout as noted.   Radiology Dg Chest Port 1 View  Result Date: 09/10/2019 CLINICAL DATA:  Syncope EXAM: PORTABLE CHEST 1 VIEW COMPARISON:  April 22, 2019 FINDINGS: There is cardiomegaly. Pulmonary vascular congestion is seen. There is also increased interstitial markings seen throughout both lungs. No pleural effusion. Overlying median sternotomy wires are present. IMPRESSION: Cardiomegaly and interstitial edema. Electronically Signed   By: Prudencio Pair M.D.   On: 09/10/2019 20:53    Procedures Procedures (including critical care time)  Medications Ordered in ED Medications - No data to display   Initial Impression / Assessment and Plan / ED Course  I have reviewed the triage vital signs and the nursing notes.  Pertinent labs & imaging results that were available during my care of the patient were reviewed by me and considered in my medical decision making (see chart for details).        Elderly male with multiple cardiac conditions, recent medication changes presenting today with near syncope which was abrupt and without warning.  Patient currently has no  complaints and vital signs are within normal limits.  He does have some evidence of mild fluid overload with bilateral basal rales and 1+ pitting edema in the lower extremities.  He does admit to having exertional chest pain and shortness of breath which has been ongoing since June but may be slightly worse.  Recently started back on half of his ramipril and metoprolol last week due to being hypertensive in the office.  He is still taking 60 mg of Lasix daily and has not missed any doses.  He denies any significant weight gain over the last month.  Patient does take 81 mg aspirin but no other anticoagulants.  EKG with a persistent right bundle branch block but no acute changes.  Given patient's age, cardiac risk factors and sudden onset of symptoms syncope is more concerning.  Could be related to recent medication changes versus acute coronary issue or dysrhythmia.  No evidence of heart block on EKG.  Patient does not have symptoms concerning for sepsis or underlying infection.  10:03 PM Patient troponin  is elevated at 55 however prior troponins appeared to be mildly elevated at baseline.  CMP with stable renal function with creatinine 1.29 and CBC with stable hemoglobin.  BNP is elevated at 584 but improved from past admission.  However given ongoing exertional dyspnea, interstitial edema on chest x-ray and signs of ongoing fluid overload patient given 1 dose of IV Lasix.  Given abrupt onset of syncope today without warning signs, age and other known cardiac risk factors will admit patient for further evaluation.     Final Clinical Impressions(s) / ED Diagnoses   Final diagnoses:  Near syncope  Congestive heart failure, unspecified HF chronicity, unspecified heart failure type [facility])    ED Discharge Orders    None       Blanchie Dessert, MD 09/10/19 2205

## 2019-09-10 NOTE — ED Notes (Signed)
Pt on phone updating wife.

## 2019-09-10 NOTE — H&P (Addendum)
History and Physical    Andre Miranda [id number] DOB: June 14, 1936 DOA: 09/10/2019  PCP: Crist Infante, MD  Patient coming from: Home  I have personally briefly reviewed patient's old medical records in [place]  Chief Complaint: Near syncope  HPI: Andre Miranda is a 83 y.o. male with medical history significant of CAD s/p CABG x4 in 2003, right carotid endarterectomy in 2009, aortic stenosis s/p TAVR with bioprosthetic valve in [id number], chronic diastolic heart failure with EF 60-65% (04/2019), OSA on CPAP, history of prostate cancer status post radiation who presented with concerns of near syncope.    Patient reports that today he was in the kitchen standing and after talking with his PCP on the phone regarding some insomnia and chest tightness issues he is started to "slumped down towards the floor."  His wife saw him and helped him to the floor.  Denies any loss of consciousness.  Denies any symptoms of headache, vision changes, weakness, chest pain, palpitation, shortness of breath prior to the presyncopal episode.  No symptoms afterwards.  Patient reports that he has had ongoing chest tightness that is worse in the morning but resolves when he wakes up and goes about his day.  Patient recently had a follow-up with cardiology outpatient 4 days ago and was restarted on metoprolol 25 mg twice daily and low-dose ACE.  His metoprolol, ramipril and HCTZ were discontinued in his last admission back in June for acute CHF exacerbation due to bradycardia and hypotension. There was plans to evaluate whether medical therapy will help with his exertional chest pain and to reevaluate in 2 weeks to discuss possible cardiac catheter if symptoms persist.  ED Course: He was afebrile and normotensive on room air.  Heart rate ranging from 60s to 70s. CBC showed no leukocytosis but had mild anemia of 12.6.  CMP showed sodium of 133, glucose of 137, creatinine mildly elevated 1.29.  Platelet of 104.  Mildly elevated bilirubin of 1.5.     Review of Systems:  Constitutional: No Weight Change, No Fever ENT/Mouth: No sore throat, No Rhinorrhea Eyes: No Eye Pain, No Vision Changes Cardiovascular: No Chest Pain, no SOB, No Edema, No Palpitations Respiratory: No Cough, No Sputum, No Wheezing, no Dyspnea  Gastrointestinal: No Nausea, No Vomiting, No Diarrhea, No Constipation, No Pain Genitourinary: no Urinary Incontinence, No Urgency, No Flank Pain Musculoskeletal: No Arthralgias, No Myalgias Skin: No Skin Lesions, No Pruritus, Neuro: no Weakness, No Numbness,  No Loss of Consciousness, No Syncope Psych: No Anxiety/Panic, No Depression, no decrease appetite Heme/Lymph: No Bruising, No Bleeding  Past Medical History:  Diagnosis Date   Aortic stenosis    MODERATE   Cancer (HCC)    prostate cancer-radiation   Carotid arterial disease (HCC)    Coronary artery disease    Dysuria    GERD (gastroesophageal reflux disease)    Gout    Hematuria    History of urinary retention    Hyperlipidemia    Hypertension    Obesity    Sleep apnea    uses CPAP nightly    Past Surgical History:  Procedure Laterality Date   AORTIC VALVE REPAIR  February 06, 2015   Dr. Aline Brochure and Dr. Ysidro Evert  at [place]  02/2008   CHOLECYSTECTOMY  04/2002   CORONARY ARTERY BYPASS GRAFT  2003   LIMA TO THE LAD, RIMA TO THE RCA, AND A SAPHENOUS VEIN GRAFT TO THE INTERMEDIATE AND DISTAL LEFT CIRCUMFLEX   ORIF  ANKLE FRACTURE Right 02/14/2019   Procedure: OPEN REDUCTION INTERNAL FIXATION (ORIF) ANKLE FRACTURE;  Surgeon: Netta Cedars, MD;  Location: [facility];  Service: Orthopedics;  Laterality: Right;   US ECHOCARDIOGRAPHY  08/2009   SHOWED A MEAN GRADIENT ACROSS IS AORTIC VALVE OF 24 MM OF MERCURY. HE HAD MODERATE LVH AND NORMAL LV FUNCTION     reports that he quit smoking about 48 years ago. His smoking use included cigarettes. He has a 10.50 pack-year  smoking history. He has never used smokeless tobacco. He reports current alcohol use. He reports that he does not use drugs.  Allergies  Allergen Reactions   Ibuprofen Hypertension    Family History  Problem Relation Age of Onset   Heart attack Father    Pneumonia Mother 88   Family history reviewed and not pertinent   Prior to Admission medications   Medication Sig Start Date End Date Taking? Authorizing Provider  allopurinol (ZYLOPRIM) 300 MG tablet Take 300 mg by mouth daily.     Yes [provider]  amLODipine (NORVASC) 10 MG tablet TAKE 1 TABLET BY MOUTH ONCE DAILY 12/29/18  Yes Belva Crome, MD  aspirin EC 81 MG tablet Take 81 mg by mouth at bedtime.  11/23/12  Yes Larey Dresser, MD  Cholecalciferol (VITAMIN D3) 1000 UNITS CAPS Take 1 capsule by mouth at bedtime.    Yes [provider]  famotidine (PEPCID) 20 MG tablet Take 20 mg by mouth daily.     Yes [provider]  furosemide (LASIX) 40 MG tablet Take 1.5 tablets (60 mg total) by mouth daily. 08/02/19  Yes Belva Crome, MD  metoprolol tartrate (LOPRESSOR) 25 MG tablet Take 1 tablet (25 mg total) by mouth 2 (two) times daily. 09/06/19 12/05/19 Yes Burtis Junes, NP  Multiple Vitamins-Minerals (CENTRUM SILVER) tablet Take 1 tablet by mouth daily.     Yes [provider]  ramipril (ALTACE) 5 MG capsule Take 1 capsule (5 mg total) by mouth daily. 09/06/19 12/05/19 Yes Burtis Junes, NP  rosuvastatin (CRESTOR) 20 MG tablet Take 10 mg by mouth every evening.   Yes [provider]  spironolactone (ALDACTONE) 25 MG tablet TAKE 1 TABLET BY MOUTH ONCE DAILY. PLEASE KEEP UPCOMING APPT IN FEB FOR FUTURE REFILLS Patient taking differently: Take 25 mg by mouth daily.  01/24/19  Yes Belva Crome, MD  Tamsulosin HCl (FLOMAX) 0.4 MG CAPS Take 0.4 mg by mouth every evening.    Yes [provider]  temazepam (RESTORIL) 30 MG capsule Take 30 mg by mouth at bedtime as needed for  sleep.   Yes [provider]    Physical Exam: Vitals:   09/10/19 2115 09/10/19 2130 09/10/19 2145 09/10/19 2215  BP: (!) 138/51 (!) 133/46 (!) 134/56 (!) 129/50  Pulse: 65 68 72 66  Resp: 12 17 16 17   Temp:      TempSrc:      SpO2: 95% 94% 94% 93%  Weight:      Height:        Constitutional: NAD, calm, comfortable, obese male sitting up in bed Vitals:   09/10/19 2115 09/10/19 2130 09/10/19 2145 09/10/19 2215  BP: (!) 138/51 (!) 133/46 (!) 134/56 (!) 129/50  Pulse: 65 68 72 66  Resp: 12 17 16 17   Temp:      TempSrc:      SpO2: 95% 94% 94% 93%  Weight:      Height:  Eyes: PERRL, lids and conjunctivae normal ENMT: Mucous membranes are moist.  Neck: normal, supple, no masses Respiratory: clear to auscultation bilaterally, no wheezing, no crackles. Normal respiratory effort. No accessory muscle use.  Cardiovascular: Regular rate and rhythm, no murmurs / rubs / gallops. No extremity edema. 2+ pedal pulses. Healed mid-sternal scar.  Abdomen: no tenderness, no masses palpated.  Bowel sounds positive.  Musculoskeletal: no clubbing / cyanosis. No joint deformity upper and lower extremities. Good ROM, no contractures. Normal muscle tone.  Skin: no rashes, lesions, ulcers. No induration Neurologic: CN 2-12 grossly intact. Sensation intact, DTR normal. Strength 5/5 in all 4.  Psychiatric: Normal judgment and insight. Alert and oriented x 3. Normal mood.    Labs on Admission: I have personally reviewed following labs and imaging studies  CBC: Recent Labs  Lab 09/10/19 1932  WBC 8.1  HGB 12.6*  HCT 39.1  MCV 95.4  PLT 123456*   Basic Metabolic Panel: Recent Labs  Lab 09/06/19 1115 09/10/19 1953  NA 138 133*  K 4.0 4.5  CL 101 103  CO2 23 20*  GLUCOSE 94 137*  BUN 29* 33*  CREATININE 1.23 1.29*  CALCIUM 9.4 9.1   GFR: Estimated Creatinine Clearance: 54.6 mL/min (A) (by C-G formula based on SCr of 1.29 mg/dL (H)). Liver Function Tests: Recent Labs  Lab  09/10/19 1953  AST 27  ALT 19  ALKPHOS 80  BILITOT 1.5*  PROT 6.2*  ALBUMIN 3.8   No results for input(s): LIPASE, AMYLASE in the last 168 hours. No results for input(s): AMMONIA in the last 168 hours. Coagulation Profile: No results for input(s): INR, PROTIME in the last 168 hours. Cardiac Enzymes: No results for input(s): CKTOTAL, CKMB, CKMBINDEX, TROPONINI in the last 168 hours. BNP (last 3 results) No results for input(s): PROBNP in the last 8760 hours. HbA1C: No results for input(s): HGBA1C in the last 72 hours. CBG: Recent Labs  Lab 09/10/19 1943  GLUCAP 134*   Lipid Profile: No results for input(s): CHOL, HDL, LDLCALC, TRIG, CHOLHDL, LDLDIRECT in the last 72 hours. Thyroid Function Tests: No results for input(s): TSH, T4TOTAL, FREET4, T3FREE, THYROIDAB in the last 72 hours. Anemia Panel: No results for input(s): VITAMINB12, FOLATE, FERRITIN, TIBC, IRON, RETICCTPCT in the last 72 hours. Urine analysis:    Component Value Date/Time   COLORURINE RED BIOCHEMICALS MAY BE AFFECTED BY COLOR (A) 04/21/2009 0212   APPEARANCEUR CLOUDY (A) 04/21/2009 0212   LABSPEC 1.014 04/21/2009 0212   PHURINE 6.0 04/21/2009 0212   GLUCOSEU NEGATIVE 04/21/2009 0212   HGBUR LARGE (A) 04/21/2009 0212   BILIRUBINUR NEGATIVE 04/21/2009 0212   KETONESUR NEGATIVE 04/21/2009 0212   PROTEINUR 100 (A) 04/21/2009 0212   UROBILINOGEN 0.2 04/21/2009 0212   NITRITE NEGATIVE 04/21/2009 0212   LEUKOCYTESUR MODERATE (A) 04/21/2009 0212    Radiological Exams on Admission: Dg Chest Port 1 View  Result Date: 09/10/2019 CLINICAL DATA:  Syncope EXAM: PORTABLE CHEST 1 VIEW COMPARISON:  April 22, 2019 FINDINGS: There is cardiomegaly. Pulmonary vascular congestion is seen. There is also increased interstitial markings seen throughout both lungs. No pleural effusion. Overlying median sternotomy wires are present. IMPRESSION: Cardiomegaly and interstitial edema. Electronically Signed   By: Prudencio Pair M.D.    On: 09/10/2019 20:53    EKG: Independently reviewed.   Assessment/Plan  Near syncope -Pt with complex hx of CAD s/p CABG x4 with onging exertion angina, aortic stenosis s/p TAVR, Right carotid endarterectomy - He was resumed on metoprolol and ramipril 4 days  ago by cardiology outpatient since they were discontinued in his last admission in June for bradycardia and hypotension. -Blood pressure is normal on admission and heart rate of 80s. -Do not suspect that adding back metoprolol and lisinopril is a cause to his presyncopal symptoms. - EKG showed prolonged PR interval and RBBB. Troponin mildly elevated at 55 and then 64. BNP of 584 with cardiomegaly and interstital edema on CXR but appears euvolemic on exam . He was given IV 40 mg Lasix in ED.  Does not feel he is in CHF exacerbation. - will trend troponin  - Will keep on cardiac telemetry  - get orthostatic V/S - check TSH - get carotid duplex ultrasound with hx of Right CEA   Mild AKI -Hold recently started ramipril -Avoid nephrotoxic agent -Monitor with BMP   Hypertension -Continue amlodipine -Hold ramipril due to elevated creatinine  CAD s/p CABG -Continue Lopressor, spironolactone  Hyperlipidemia -Continue Crestor  Insomnia -Continue Restoril as needed  GERD -Continue Pepcid  History of prostate cancer s/p radiation treatment -Continue Flomax  OSA -Continue CPAP  Chronic thrombocytopenia - stable. No active bleeding - continue to monitor  DVT prophylaxis:.Lovenox Code Status:Full  Family Communication: Plan discussed with patient at bedside and discussed with wife Jeanett Schlein over the phone. All questions answered.  [phone]  disposition Plan: Home with at least 2 midnight stays  Consults called:  Admission status: inpatient  Shakirah Kirkey T Ma Munoz DO [facility]   If 7PM-7AM, please contact night-coverage [email or web address] Password [facility]  09/10/2019, 10:47 PM

## 2019-09-10 NOTE — ED Notes (Signed)
Wife Jeanett Schlein would like an updated call for information and plan of care.   [phone]

## 2019-09-10 NOTE — ED Triage Notes (Addendum)
Pt BIB [facility] from home following 'almost' syncopal episode. Pt states he was talking to wife when he he began to fall. He states it felt like he was in a "twilight' state. We was able to hear wife call out to him, but did not realize he was falling. She lowered pt to floor. Denies injury. Denies Pain.  He had been having sleep problems and tightness of chest. Denies chest tightness.  HX of new cardiac problem in June, uncertain of DX. Hx CAD   [facility] VS  HR 61 BP 125/47 RR 12 SpO2 96 CBG 139 EKG NSR, RBB  Meds: 324 aspirin

## 2019-09-10 NOTE — ED Notes (Signed)
EDP at bedside  

## 2019-09-11 ENCOUNTER — Observation Stay (HOSPITAL_COMMUNITY): Payer: Medicare Other

## 2019-09-11 DIAGNOSIS — E785 Hyperlipidemia, unspecified: Secondary | ICD-10-CM | POA: Diagnosis present

## 2019-09-11 DIAGNOSIS — D649 Anemia, unspecified: Secondary | ICD-10-CM | POA: Diagnosis present

## 2019-09-11 DIAGNOSIS — Y712 Prosthetic and other implants, materials and accessory cardiovascular devices associated with adverse incidents: Secondary | ICD-10-CM | POA: Diagnosis present

## 2019-09-11 DIAGNOSIS — Z923 Personal history of irradiation: Secondary | ICD-10-CM | POA: Diagnosis not present

## 2019-09-11 DIAGNOSIS — I110 Hypertensive heart disease with heart failure: Secondary | ICD-10-CM | POA: Diagnosis present

## 2019-09-11 DIAGNOSIS — I25118 Atherosclerotic heart disease of native coronary artery with other forms of angina pectoris: Secondary | ICD-10-CM | POA: Diagnosis present

## 2019-09-11 DIAGNOSIS — D696 Thrombocytopenia, unspecified: Secondary | ICD-10-CM | POA: Diagnosis present

## 2019-09-11 DIAGNOSIS — Z9049 Acquired absence of other specified parts of digestive tract: Secondary | ICD-10-CM | POA: Diagnosis not present

## 2019-09-11 DIAGNOSIS — N179 Acute kidney failure, unspecified: Secondary | ICD-10-CM | POA: Diagnosis present

## 2019-09-11 DIAGNOSIS — I359 Nonrheumatic aortic valve disorder, unspecified: Secondary | ICD-10-CM

## 2019-09-11 DIAGNOSIS — I4510 Unspecified right bundle-branch block: Secondary | ICD-10-CM | POA: Diagnosis present

## 2019-09-11 DIAGNOSIS — R55 Syncope and collapse: Secondary | ICD-10-CM

## 2019-09-11 DIAGNOSIS — Z8546 Personal history of malignant neoplasm of prostate: Secondary | ICD-10-CM | POA: Diagnosis not present

## 2019-09-11 DIAGNOSIS — I2510 Atherosclerotic heart disease of native coronary artery without angina pectoris: Secondary | ICD-10-CM | POA: Diagnosis not present

## 2019-09-11 DIAGNOSIS — I440 Atrioventricular block, first degree: Secondary | ICD-10-CM | POA: Diagnosis present

## 2019-09-11 DIAGNOSIS — I5032 Chronic diastolic (congestive) heart failure: Secondary | ICD-10-CM | POA: Diagnosis present

## 2019-09-11 DIAGNOSIS — Y831 Surgical operation with implant of artificial internal device as the cause of abnormal reaction of the patient, or of later complication, without mention of misadventure at the time of the procedure: Secondary | ICD-10-CM | POA: Diagnosis present

## 2019-09-11 DIAGNOSIS — E669 Obesity, unspecified: Secondary | ICD-10-CM | POA: Diagnosis present

## 2019-09-11 DIAGNOSIS — Z6833 Body mass index (BMI) 33.0-33.9, adult: Secondary | ICD-10-CM | POA: Diagnosis not present

## 2019-09-11 DIAGNOSIS — Z20828 Contact with and (suspected) exposure to other viral communicable diseases: Secondary | ICD-10-CM | POA: Diagnosis present

## 2019-09-11 DIAGNOSIS — K219 Gastro-esophageal reflux disease without esophagitis: Secondary | ICD-10-CM | POA: Diagnosis present

## 2019-09-11 DIAGNOSIS — Z953 Presence of xenogenic heart valve: Secondary | ICD-10-CM | POA: Diagnosis not present

## 2019-09-11 DIAGNOSIS — M109 Gout, unspecified: Secondary | ICD-10-CM | POA: Diagnosis present

## 2019-09-11 DIAGNOSIS — I10 Essential (primary) hypertension: Secondary | ICD-10-CM | POA: Diagnosis not present

## 2019-09-11 DIAGNOSIS — I959 Hypotension, unspecified: Secondary | ICD-10-CM | POA: Diagnosis present

## 2019-09-11 DIAGNOSIS — T8203XA Leakage of heart valve prosthesis, initial encounter: Secondary | ICD-10-CM | POA: Diagnosis present

## 2019-09-11 DIAGNOSIS — G4733 Obstructive sleep apnea (adult) (pediatric): Secondary | ICD-10-CM | POA: Diagnosis present

## 2019-09-11 DIAGNOSIS — G4700 Insomnia, unspecified: Secondary | ICD-10-CM | POA: Diagnosis present

## 2019-09-11 DIAGNOSIS — Z951 Presence of aortocoronary bypass graft: Secondary | ICD-10-CM | POA: Diagnosis not present

## 2019-09-11 LAB — TSH: TSH: 4.811 u[IU]/mL — ABNORMAL HIGH (ref 0.350–4.500)

## 2019-09-11 LAB — URINALYSIS, ROUTINE W REFLEX MICROSCOPIC
Bilirubin Urine: NEGATIVE
Glucose, UA: NEGATIVE mg/dL
Hgb urine dipstick: NEGATIVE
Ketones, ur: NEGATIVE mg/dL
Leukocytes,Ua: NEGATIVE
Nitrite: NEGATIVE
Protein, ur: NEGATIVE mg/dL
Specific Gravity, Urine: 1.006 (ref 1.005–1.030)
pH: 5 (ref 5.0–8.0)

## 2019-09-11 LAB — CBC
HCT: 37.4 % — ABNORMAL LOW (ref 39.0–52.0)
Hemoglobin: 12.7 g/dL — ABNORMAL LOW (ref 13.0–17.0)
MCH: 30.9 pg (ref 26.0–34.0)
MCHC: 34 g/dL (ref 30.0–36.0)
MCV: 91 fL (ref 80.0–100.0)
Platelets: 104 10*3/uL — ABNORMAL LOW (ref 150–400)
RBC: 4.11 MIL/uL — ABNORMAL LOW (ref 4.22–5.81)
RDW: 14.2 % (ref 11.5–15.5)
WBC: 9.7 10*3/uL (ref 4.0–10.5)
nRBC: 0 % (ref 0.0–0.2)

## 2019-09-11 LAB — BASIC METABOLIC PANEL
Anion gap: 10 (ref 5–15)
BUN: 33 mg/dL — ABNORMAL HIGH (ref 8–23)
CO2: 24 mmol/L (ref 22–32)
Calcium: 9 mg/dL (ref 8.9–10.3)
Chloride: 101 mmol/L (ref 98–111)
Creatinine, Ser: 1.26 mg/dL — ABNORMAL HIGH (ref 0.61–1.24)
GFR calc Af Amer: >60 mL/min (ref >60)
GFR calc non Af Amer: 53 mL/min — ABNORMAL LOW (ref >60)
Glucose, Bld: 152 mg/dL — ABNORMAL HIGH (ref 70–99)
Potassium: 4.2 mmol/L (ref 3.5–5.1)
Sodium: 135 mmol/L (ref 135–145)

## 2019-09-11 LAB — TROPONIN I (HIGH SENSITIVITY)
Troponin I (High Sensitivity): 58 ng/L — ABNORMAL HIGH (ref <18)
Troponin I (High Sensitivity): 59 ng/L — ABNORMAL HIGH (ref <18)

## 2019-09-11 LAB — SARS CORONAVIRUS 2 (TAT 6-24 HRS): SARS Coronavirus 2: NEGATIVE

## 2019-09-11 LAB — MAGNESIUM: Magnesium: 2.2 mg/dL (ref 1.7–2.4)

## 2019-09-11 NOTE — ED Notes (Signed)
ED TO INPATIENT HANDOFF REPORT  ED Nurse Name and Phone #: [phone]  S Name/Age/Gender Andre Miranda 83 y.o. male Room/Bed: 033C/033C  Code Status   Code Status: Full Code  Home/SNF/Other Home Patient oriented to: self, place, time and situation Is this baseline? Yes   Triage Complete: Triage complete  Chief Complaint Syncope  Triage Note Pt BIB [facility] from home following 'almost' syncopal episode. Pt states he was talking to wife when he he began to fall. He states it felt like he was in a "twilight' state. We was able to hear wife call out to him, but did not realize he was falling. She lowered pt to floor. Denies injury. Denies Pain.  He had been having sleep problems and tightness of chest. Denies chest tightness.  HX of new cardiac problem in June, uncertain of DX. Hx CAD   EMS VS  HR 61 BP 125/47 RR 12 SpO2 96 CBG 139 EKG NSR, RBB  Meds: 32 aspirin     Allergies Allergies  Allergen Reactions  . Ibuprofen Hypertension    Level of Care/Admitting Diagnosis ED Disposition    ED Disposition Condition [facility] Area: [place] [[id number]]  Level of Care: Telemetry Cardiac [103]  I expect the patient will be discharged within 24 hours: No (not a candidate for 5C-Observation unit)  Covid Evaluation: Asymptomatic Screening Protocol (No Symptoms)  Diagnosis: Pre-syncope [id number]  Admitting Physician: Orene Desanctis [id number]  Attending Physician: Orene Desanctis [id number]  PT Class (Do Not Modify): Observation [104]  PT Acc Code (Do Not Modify): Observation [10022]       B Medical/Surgery History Past Medical History:  Diagnosis Date  . Aortic stenosis    MODERATE  . Cancer [facility])    prostate cancer-radiation  . Carotid arterial disease ([place])   . Coronary artery disease   . Dysuria   . GERD (gastroesophageal reflux disease)   . Gout   . Hematuria   . History of urinary retention   . Hyperlipidemia   . Hypertension    . Obesity   . Sleep apnea    uses CPAP nightly   Past Surgical History:  Procedure Laterality Date  . AORTIC VALVE REPAIR  February 06, 2015   Dr. Aline Brochure and Dr. Ysidro Evert  at [facility] [place]  . [place]  02/2008  . CHOLECYSTECTOMY  04/2002  . CORONARY ARTERY BYPASS GRAFT  2003   LIMA TO THE LAD, RIMA TO THE RCA, AND A SAPHENOUS VEIN GRAFT TO THE INTERMEDIATE AND DISTAL LEFT CIRCUMFLEX  . ORIF ANKLE FRACTURE Right 02/14/2019   Procedure: OPEN REDUCTION INTERNAL FIXATION (ORIF) ANKLE FRACTURE;  Surgeon: Netta Cedars, MD;  Location: [place];  Service: Orthopedics;  Laterality: Right;  . US ECHOCARDIOGRAPHY  08/2009   SHOWED A MEAN GRADIENT ACROSS IS AORTIC VALVE OF 24 MM OF MERCURY. HE HAD MODERATE LVH AND NORMAL LV FUNCTION     A IV Location/Drains/Wounds Patient Lines/Drains/Airways Status   Active Line/Drains/Airways    Name:   Placement date:   Placement time:   Site:   Days:   Peripheral IV 09/10/19 Right Antecubital   09/10/19    1922    Antecubital   1   Incision (Closed) 02/14/19 Ankle Right   02/14/19    1205     209          Intake/Output Last 24 hours  Intake/Output Summary (Last 24 hours) at 09/11/2019 0400 Last data  filed at 09/11/2019 0324 Gross per 24 hour  Intake 350 ml  Output 1055 ml  Net -705 ml    Labs/Imaging Results for orders placed or performed during the hospital encounter of 09/10/19 (from the past 48 hour(s))  CBC     Status: Abnormal   Collection Time: 09/10/19  7:32 PM  Result Value Ref Range   WBC 8.1 4.0 - 10.5 K/uL   RBC 4.10 (L) 4.22 - 5.81 MIL/uL   Hemoglobin 12.6 (L) 13.0 - 17.0 g/dL   HCT 39.1 39.0 - 52.0 %   MCV 95.4 80.0 - 100.0 fL   MCH 30.7 26.0 - 34.0 pg   MCHC 32.2 30.0 - 36.0 g/dL   RDW 14.2 11.5 - 15.5 %   Platelets 104 (L) 150 - 400 K/uL    Comment: REPEATED TO VERIFY PLATELET COUNT CONFIRMED BY SMEAR SPECIMEN CHECKED FOR CLOTS    nRBC 0.0 0.0 - 0.2 %    Comment: Performed at [facility], [place] [address], [place], [place] [address]  Brain natriuretic peptide     Status: Abnormal   Collection Time: 09/10/19  7:32 PM  Result Value Ref Range   B Natriuretic Peptide 584.6 (H) 0.0 - 100.0 pg/mL    Comment: Performed at [facility] [address], [place], [place] [address]  CBG monitoring, ED     Status: Abnormal   Collection Time: 09/10/19  7:43 PM  Result Value Ref Range   Glucose-Capillary 134 (H) 70 - 99 mg/dL  Troponin I (High Sensitivity)     Status: Abnormal   Collection Time: 09/10/19  7:53 PM  Result Value Ref Range   Troponin I (High Sensitivity) 55 (H) <18 ng/L    Comment: (NOTE) Elevated high sensitivity troponin I (hsTnI) values and significant  changes across serial measurements may suggest ACS but many other  chronic and acute conditions are known to elevate hsTnI results.  Refer to the "Links" section for chest pain algorithms and additional  guidance. Performed at [facility], [place] [address], [place], [place] [address]   Comprehensive metabolic panel     Status: Abnormal   Collection Time: 09/10/19  7:53 PM  Result Value Ref Range   Sodium 133 (L) 135 - 145 mmol/L   Potassium 4.5 3.5 - 5.1 mmol/L   Chloride 103 98 - 111 mmol/L   CO2 20 (L) 22 - 32 mmol/L   Glucose, Bld 137 (H) 70 - 99 mg/dL   BUN 33 (H) 8 - 23 mg/dL   Creatinine, Ser 1.29 (H) 0.61 - 1.24 mg/dL   Calcium 9.1 8.9 - 10.3 mg/dL   Total Protein 6.2 (L) 6.5 - 8.1 g/dL   Albumin 3.8 3.5 - 5.0 g/dL   AST 27 15 - 41 U/L   ALT 19 0 - 44 U/L   Alkaline Phosphatase 80 38 - 126 U/L   Total Bilirubin 1.5 (H) 0.3 - 1.2 mg/dL   GFR calc non Af Amer 52 (L) >60 mL/min   GFR calc Af Amer 60 (L) >60 mL/min   Anion gap 10 5 - 15    Comment: Performed at [facility], [place] [address], [place], [place] [address]  SARS CORONAVIRUS 2 (TAT 6-24 HRS) Nasopharyngeal Nasopharyngeal Swab     Status: None   Collection Time: 09/10/19  9:12 PM   Specimen: Nasopharyngeal Swab   Result Value Ref Range   SARS Coronavirus 2 NEGATIVE NEGATIVE    Comment: (  NOTE) SARS-CoV-2 target nucleic acids are NOT DETECTED. The SARS-CoV-2 RNA is generally detectable in upper and lower respiratory specimens during the acute phase of infection. Negative results do not preclude SARS-CoV-2 infection, do not rule out co-infections with other pathogens, and should not be used as the sole basis for treatment or other patient management decisions. Negative results must be combined with clinical observations, patient history, and epidemiological information. The expected result is Negative. Fact Sheet for Patients: SugarRoll.be Fact Sheet for Healthcare Providers: https://www.woods-mathews.com/ This test is not yet approved or cleared by the [place] FDA and  has been authorized for detection and/or diagnosis of SARS-CoV-2 by FDA under an Emergency Use Authorization (EUA). This EUA will remain  in effect (meaning this test can be used) for the duration of the COVID-19 declaration under Section 56 4(b)(1) of the Act, 21 U.S.C. section 360bbb-3(b)(1), unless the authorization is terminated or revoked sooner. Performed at [facility], [place] [address] [place], [place] [address]   Troponin I (High Sensitivity)     Status: Abnormal   Collection Time: 09/10/19 10:01 PM  Result Value Ref Range   Troponin I (High Sensitivity) 64 (H) <18 ng/L    Comment: (NOTE) Elevated high sensitivity troponin I (hsTnI) values and significant  changes across serial measurements may suggest ACS but many other  chronic and acute conditions are known to elevate hsTnI results.  Refer to the "Links" section for chest pain algorithms and additional  guidance. Performed at [facility], [place] [address] [place], [place] [address]   Urinalysis, Routine w reflex microscopic     Status: Abnormal   Collection Time: 09/10/19 11:44 PM  Result Value Ref  Range   Color, Urine STRAW (A) YELLOW   APPearance CLEAR CLEAR   Specific Gravity, Urine 1.006 1.005 - 1.030   pH 5.0 5.0 - 8.0   Glucose, UA NEGATIVE NEGATIVE mg/dL   Hgb urine dipstick NEGATIVE NEGATIVE   Bilirubin Urine NEGATIVE NEGATIVE   Ketones, ur NEGATIVE NEGATIVE mg/dL   Protein, ur NEGATIVE NEGATIVE mg/dL   Nitrite NEGATIVE NEGATIVE   Leukocytes,Ua NEGATIVE NEGATIVE    Comment: Performed at [facility] [address] [place], [place] [address]  Troponin I (High Sensitivity)     Status: Abnormal   Collection Time: 09/10/19 11:50 PM  Result Value Ref Range   Troponin I (High Sensitivity) 58 (H) <18 ng/L    Comment: (NOTE) Elevated high sensitivity troponin I (hsTnI) values and significant  changes across serial measurements may suggest ACS but many other  chronic and acute conditions are known to elevate hsTnI results.  Refer to the "Links" section for chest pain algorithms and additional  guidance. Performed at [facility], [place] [address], [place], [place] [address]   TSH     Status: Abnormal   Collection Time: 09/10/19 11:50 PM  Result Value Ref Range   TSH 4.811 (H) 0.350 - 4.500 uIU/mL    Comment: Performed by a 3rd Generation assay with a functional sensitivity of <=0.01 uIU/mL. Performed at [facility], [place] [address], [place], [place] [id number]   Basic metabolic panel     Status: Abnormal   Collection Time: 09/11/19  1:32 AM  Result Value Ref Range   Sodium 135 135 - 145 mmol/L   Potassium 4.2 3.5 - 5.1 mmol/L   Chloride 101 98 - 111 mmol/L   CO2 24 22 - 32 mmol/L   Glucose, Bld 152 (H)  70 - 99 mg/dL   BUN 33 (H) 8 - 23 mg/dL   Creatinine, Ser 1.26 (H) 0.61 - 1.24 mg/dL   Calcium 9.0 8.9 - 10.3 mg/dL   GFR calc non Af Amer 53 (L) >60 mL/min   GFR calc Af Amer >60 >60 mL/min   Anion gap 10 5 - 15    Comment: Performed at [facility] [address], [place], [place] [address]  Troponin I (High Sensitivity)     Status:  Abnormal   Collection Time: 09/11/19  1:32 AM  Result Value Ref Range   Troponin I (High Sensitivity) 59 (H) <18 ng/L    Comment: (NOTE) Elevated high sensitivity troponin I (hsTnI) values and significant  changes across serial measurements may suggest ACS but many other  chronic and acute conditions are known to elevate hsTnI results.  Refer to the "Links" section for chest pain algorithms and additional  guidance. Performed at [facility], [place] [address], [place], [place] [address]    Dg Chest Port 1 View  Result Date: 09/10/2019 CLINICAL DATA:  Syncope EXAM: PORTABLE CHEST 1 VIEW COMPARISON:  April 22, 2019 FINDINGS: There is cardiomegaly. Pulmonary vascular congestion is seen. There is also increased interstitial markings seen throughout both lungs. No pleural effusion. Overlying median sternotomy wires are present. IMPRESSION: Cardiomegaly and interstitial edema. Electronically Signed   By: Prudencio Pair M.D.   On: 09/10/2019 20:53    Pending Labs Unresulted Labs (From admission, onward)    Start     Ordered   09/11/19 0200  CBC  Once,   R     09/11/19 0200          Vitals/Pain Today's Vitals   09/10/19 2345 09/10/19 2351 09/11/19 0000 09/11/19 0133  BP: (!) 131/54  (!) 108/42   Pulse: 73  65   Resp:   13   Temp:      TempSrc:      SpO2:   93%   Weight:      Height:      PainSc:  0-No pain  0-No pain    Isolation Precautions No active isolations  Medications Medications  enoxaparin (LOVENOX) injection 40 mg (has no administration in time range)  aspirin EC tablet 81 mg (81 mg Oral Not Given 09/10/19 2345)  amLODipine (NORVASC) tablet 10 mg (has no administration in time range)  furosemide (LASIX) tablet 60 mg (has no administration in time range)  metoprolol tartrate (LOPRESSOR) tablet 25 mg (25 mg Oral Given 09/10/19 2345)  rosuvastatin (CRESTOR) tablet 10 mg (has no administration in time range)  spironolactone (ALDACTONE) tablet 25 mg (has no  administration in time range)  temazepam (RESTORIL) capsule 30 mg (has no administration in time range)  famotidine (PEPCID) tablet 20 mg (has no administration in time range)  tamsulosin (FLOMAX) capsule 0.4 mg (has no administration in time range)  allopurinol (ZYLOPRIM) tablet 300 mg (has no administration in time range)  furosemide (LASIX) injection 40 mg (40 mg Intravenous Given 09/10/19 2207)    Mobility walks with person assist (S) Moderate fall risk(ambulated in room idependetly, possible syncope, maintin pt on moderate fall risk with 1 assist)   Focused Assessments Cardiac Assessment Handoff:  Cardiac Rhythm: (S) Normal sinus rhythm Lab Results  Component Value Date   CKTOTAL 130 03/15/2008   CKMB 1.7 03/15/2008   TROPONINI 0.05 ([place]) 04/23/2019   No results found for: DDIMER Does the Patient currently have chest pain? No  R Recommendations: See Admitting Provider Note  Report given to:   Additional Notes: -

## 2019-09-11 NOTE — Consult Note (Signed)
Cardiology Consultation:   Patient ID: Andre Miranda MRN: [id number]; DOB: [birth year]  Admit date: 09/10/2019 Date of Consult: 09/11/2019  Primary Care Provider: Crist Infante, MD Primary Cardiologist: Andre Grooms, MD     Patient Profile:   Andre Miranda is a 83 y.o. male with a hx of CAD and CHF  who is being seen today for Andre evaluation of near syncope at Andre request of Andre Miranda  History of Present Illness:   Andre Miranda is an 37 Miranda who is followed by Andre Miranda in clinic   Andre Miranda was just seen by Andre Miranda on 09/06/19 Andre Miranda has CAD and is s/p CABG in 2003.  Andre Miranda is s/p TAVR for AS (at Andre Miranda) in 2016.  Alos hx of HTN, HL, chronic diastolic CHF, CV dz (s/p R CEA 2009), OSA Th Miranda was admitted this summer for exacerbation of diastolic CHF   B BLocker and ACE I held due to bradycardia and hypotension  Andre Miranda saw  Andre Miranda on 10/20  At that visit Andre Miranda complained of chest tightness in am and later in day with exertion  SOme heart skipping  She recomm restarting medical Rx with metoprolol 25 bid and low dose ACE.  Plan for f/u in 2 wks.  Discussed cath if symtpoms persisted. At that visit his wt was down and volume looked OK    Andre Miranda had murmur on exam.  BP was high    Andre Miranda says Andre Miranda did ok later in week  Stil had am fullness in chest   Has fullness with physical acitvity   NOted SOB with bending    BP was better 130s   Saturday (yesterday ) Andre Miranda went out to get carry out dinner   Came home   Was on phone with PCP to discuss insomnia and tightness in chest   Hung up    Started to eat a cookie and felt woozy.   SLumped down but not fully out  Leaned toward wife  SPit cookie out  Denied CP   NO palpitations Came to ED   On admit BP was OK  HR 80s    Orthostatic vital signs were negative  (BP 124-136/  P 75 to 79)  Heart Pathway Score:     Past Medical History:  Diagnosis Date  . Aortic stenosis    MODERATE  . Cancer Andre Miranda)    prostate cancer-radiation  . Carotid arterial disease (Andre Miranda)    . Coronary artery disease   . Dysuria   . GERD (gastroesophageal reflux disease)   . Gout   . Hematuria   . History of urinary retention   . Hyperlipidemia   . Hypertension   . Obesity   . Sleep apnea    uses CPAP nightly    Past Surgical History:  Procedure Laterality Date  . AORTIC VALVE REPAIR  February 06, 2015   Andre. Aline Brochure and Andre. Ysidro Evert  at Andre Miranda  . [place]  02/2008  . CHOLECYSTECTOMY  04/2002  . CORONARY ARTERY BYPASS GRAFT  2003   LIMA TO Andre LAD, RIMA TO Andre RCA, AND A SAPHENOUS VEIN GRAFT TO Andre INTERMEDIATE AND DISTAL LEFT CIRCUMFLEX  . ORIF ANKLE FRACTURE Right 02/14/2019   Procedure: OPEN REDUCTION INTERNAL FIXATION (ORIF) ANKLE FRACTURE;  Surgeon: Andre Cedars, MD;  Location: Andre Miranda;  Service: Orthopedics;  Laterality: Right;  . US ECHOCARDIOGRAPHY  08/2009   SHOWED A MEAN GRADIENT ACROSS IS AORTIC VALVE OF  24 MM OF MERCURY. Andre Miranda HAD MODERATE LVH AND NORMAL LV FUNCTION      Inpatient Medications: Scheduled Meds: . allopurinol  300 mg Oral Daily  . amLODipine  10 mg Oral Daily  . aspirin EC  81 mg Oral QHS  . enoxaparin (LOVENOX) injection  40 mg Subcutaneous Daily  . famotidine  20 mg Oral Daily  . furosemide  60 mg Oral Daily  . metoprolol tartrate  25 mg Oral BID  . rosuvastatin  10 mg Oral QPM  . spironolactone  25 mg Oral Daily  . tamsulosin  0.4 mg Oral QPM   Continuous Infusions:  PRN Meds: temazepam  Allergies:    Allergies  Allergen Reactions  . Ibuprofen Hypertension    Social History:   Social History   Socioeconomic History  . Marital status: Married    Spouse name: Not on file  . Number of children: Not on file  . Years of education: Not on file  . Highest education level: Not on file  Occupational History  . Not on file  Social Needs  . Financial resource strain: Not on file  . Food insecurity    Worry: Not on file    Inability: Not on file  . Transportation needs    Medical: Not on  file    Non-medical: Not on file  Tobacco Use  . Smoking status: Former Smoker    Packs/day: 1.50    Years: 7.00    Pack years: 10.50    Types: Cigarettes    Quit date: 04/03/1971    Years since quitting: 48.4  . Smokeless tobacco: Never Used  Substance and Sexual Activity  . Alcohol use: Yes    Comment: social  . Drug use: No  . Sexual activity: Not on file  Lifestyle  . Physical activity    Days per week: Not on file    Minutes per session: Not on file  . Stress: Not on file  Relationships  . Social Herbalist on phone: Not on file    Gets together: Not on file    Attends religious service: Not on file    Active member of club or organization: Not on file    Attends meetings of clubs or organizations: Not on file    Relationship status: Not on file  . Intimate partner violence    Fear of current or ex partner: Not on file    Emotionally abused: Not on file    Physically abused: Not on file    Forced sexual activity: Not on file  Other Topics Concern  . Not on file  Social History Narrative  . Not on file    Family History:    Family History  Problem Relation Age of Onset  . Heart attack Father   . Pneumonia Mother 27     ROS:  Please see Andre history of present illness.   All other ROS reviewed and negative.     Physical Exam/Data:   Vitals:   09/11/19 0000 09/11/19 0520 09/11/19 1004 09/11/19 1007  BP: (!) 108/42 (!) 132/51 (!) 135/54 (!) 135/54  Pulse: 65 65 77 78  Resp: 13 14    Temp:  98 F (36.7 C)    TempSrc:  Oral    SpO2: 93% 95%    Weight:  104.7 kg    Height:  5\' 10"  (1.778 m)      Intake/Output Summary (Last 24 hours) at 09/11/2019 1155 Last data  filed at 09/11/2019 1000 Gross per 24 hour  Intake 590 ml  Output 1355 ml  Net -765 ml   Last 3 Weights 09/11/2019 09/10/2019 09/06/2019  Weight (lbs) 230 lb 14.4 oz 232 lb 6.4 oz 232 lb 6.4 oz  Weight (kg) 104.736 kg 105.416 kg 105.416 kg     Body mass index is 33.13 kg/m.   General: Obese 56 Miranda in no acute distress HEENT: normal Lymph: no adenopathy Neck: Neck full  difficujlt to assess JVP   + bruits   Endocrine:  No thryomegaly Vascular: No carotid bruits; FA pulses 2+ bilaterally without bruits  Cardiac:  normal S1, S2; RRR Gr III/VI systolic murmur base   Lungs:  clear to auscultation bilaterally, no wheezing, rhonchi or rales  Abd: Obese soft, nontender, no hepatomegaly  Ext: no edema Musculoskeletal:  No deformities, BUE and BLE strength normal and equal Skin: warm and dry  Neuro:  CNs 2-12 intact, no focal abnormalities noted Psych:  Normal affect   EKG:  Andre EKG was personally reviewed and demonstrates: Yesterday  SR 61 bpm  First degree AV block  PR .24 sec  RBBB  Telemetry:  Telemetry was personally reviewed and demonstrates:  SR   Relevant CV Studies:  Carotid and renal USN ordered    Echo in June 2020:  LVEF normal  RVEF normal  AV prosthesis present  Peak and mean gradinets through Andre valve are 61 and 31 mm Hg respectively   Holter: Feb 2020:  SR with PVCs, occ bigemy,  PVC burden 10% fr 24 hours    Laboratory Data:  High Sensitivity Troponin:   Recent Labs  Lab 09/10/19 1953 09/10/19 2201 09/10/19 2350 09/11/19 0132  TROPONINIHS 55* 64* 58* 59*     Chemistry Recent Labs  Lab 09/06/19 1115 09/10/19 1953 09/11/19 0132  NA 138 133* 135  K 4.0 4.5 4.2  CL 101 103 101  CO2 23 20* 24  GLUCOSE 94 137* 152*  BUN 29* 33* 33*  CREATININE 1.23 1.29* 1.26*  CALCIUM 9.4 9.1 9.0  GFRNONAA 55* 52* 53*  GFRAA 63 60* >60  ANIONGAP  --  10 10    Recent Labs  Lab 09/10/19 1953  PROT 6.2*  ALBUMIN 3.8  AST 27  ALT 19  ALKPHOS 80  BILITOT 1.5*   Hematology Recent Labs  Lab 09/10/19 1932  WBC 8.1  RBC 4.10*  HGB 12.6*  HCT 39.1  MCV 95.4  MCH 30.7  MCHC 32.2  RDW 14.2  PLT 104*   BNP Recent Labs  Lab 09/10/19 1932  BNP 584.6*    DDimer No results for input(s): DDIMER in Andre last 168 hours.    Radiology/Studies:  Dg Chest Port 1 View  Result Date: 09/10/2019 CLINICAL DATA:  Syncope EXAM: PORTABLE CHEST 1 VIEW COMPARISON:  April 22, 2019 FINDINGS: There is cardiomegaly. Pulmonary vascular congestion is seen. There is also increased interstitial markings seen throughout both lungs. No pleural effusion. Overlying median sternotomy wires are present. IMPRESSION: Cardiomegaly and interstitial edema. Electronically Signed   By: Prudencio Pair M.D.   On: 09/10/2019 20:53    Assessment and Plan:   1  Near syncope Miranda did not pass out  Transient    Miranda not orthostatic   Tele with SR  No signif bradycardia     Hx AV replacmeent (TAVR) in 2016  Mean gradient on echo in June was 31  Up from gradient in May 2020 (Andre) when it was 15.  Rec:  Agree with carotid USN   Would sched limited echo to evla LVEF and AV gradients Continue telemetry to look for pauses (had bradycardia this summer requiring B BLocker to be held)  Further testing will depend on these results   If AV gradient not high and HR OK consider R and L heart cath given tightness/ fullness   Hx remote CABG  2  CAD   Miranda with remote CABG in 2003   Now with fullness in chest (am, with exertion in yard, with bending)   Get limited echo  For LVEF an AV    Furhter work up  Based on results   Possible cath (R and L)   3  HTN  BP hs been high as outpt   B BLocker added back    ACE I held here     BP is much lower here   108 to 135   This may be too low at times for him    4  HL  Continue statin   For questions or updates, please contact [facility] Please consult [email or web address] for contact info under     Signed, Dorris Carnes, MD  09/11/2019 11:55 AM

## 2019-09-11 NOTE — Plan of Care (Addendum)
Received report from [facility] in ED.  Patient oriented to room, bedrails up X2, call bell within reach.  Instructed patient to call for assistance and how to order meals.  See Epic for full assessment.  No dizziness noted, Orthostatic VS completed in ED, will continue to monitor closely.  No complaints at this time.

## 2019-09-11 NOTE — Progress Notes (Signed)
Progress Note    [name]  [id number] DOB: [birth year]  DOA: 09/10/2019 PCP: Crist Infante, MD    Brief Narrative:   Medical records reviewed and are as summarized below:  Andre Miranda is an 83 y.o. male  with medical history significant of CAD s/p CABG x4 in 2003, right carotid endarterectomy in 2009, aortic stenosis s/p TAVR with bioprosthetic valve in Q000111Q, chronic diastolic heart failure with EF 60-65% (04/2019), OSA on CPAP, history of prostate cancer status post radiation who presented with concerns of near syncope.    Patient reports that today he was in the kitchen standing and after talking with his PCP on the phone regarding some insomnia and chest tightness issues he is started to "slumped down towards the floor."  His wife saw him and helped him to the floor.  Denies any loss of consciousness.  Denies any symptoms of headache, vision changes, weakness, chest pain, palpitation, shortness of breath prior to the presyncopal episode.  No symptoms afterwards.  Patient reports that he has had ongoing chest tightness that is worse in the morning but resolves when he wakes up and goes about his day.  Patient recently had a follow-up with cardiology outpatient 4 days ago and was restarted on metoprolol 25 mg twice daily and low-dose ACE.  His metoprolol, ramipril and HCTZ were discontinued in his last admission back in June for acute CHF exacerbation due to bradycardia and hypotension. There was plans to evaluate whether medical therapy will help with his exertional chest pain and to reevaluate in 2 weeks to discuss possible cardiac catheter if symptoms persist.  Assessment/Plan:   Active Problems:   Pre-syncope   Near syncope -Pt with complex hx of CAD s/p CABG x4 with onging exertion angina, aortic stenosis s/p TAVR, Right carotid endarterectomy - He was resumed on metoprolol and ramipril 4 days ago by cardiology outpatient since they were discontinued in his  last admission in June for bradycardia and hypotension. -Blood pressure is normal on admission and heart rate of 80s. -Do not suspect that adding back metoprolol and lisinopril is a cause to his presyncopal symptoms. - EKG showed prolonged PR interval and RBBB. Troponin mildly elevated at 55 and then 64. BNP of 584 with cardiomegaly and interstital edema on CXR but appears euvolemic on exam . He was given IV 40 mg Lasix in ED with documented 1L out - monitor tele - orthostatic V/S negative - check TSH improving from prior - get carotid duplex ultrasound with hx of Right CEA  -cardiology consult: limited echo:  LVEF and AV gradients-- pending results, may need L and R heart cath  Hypertension -Continue amlodipine -Hold ramipril due to elevated creatinine  CAD s/p CABG -Continue Lopressor, spironolactone  Hyperlipidemia -Continue Crestor  Insomnia -Continue Restoril as needed  GERD -Continue Pepcid  History of prostate cancer s/p radiation treatment -Continue Flomax  OSA -Continue CPAP  Chronic thrombocytopenia - stable. No active bleeding - continue to monitor while on lovenox  obesity Body mass index is 33.13 kg/m.   Family Communication/Anticipated D/C date and plan/Code Status   DVT prophylaxis: Lovenox ordered. Code Status: Full Code.  Family Communication:  Disposition Plan: needs to remain in the hospital for further work up as he is high risk for cardiac decompensation    Medical Consultants:    cards  Subjective:  No chest pain, no SOB  Objective:    Vitals:   09/11/19 0000 09/11/19 0520 09/11/19 1004 09/11/19 1007  BP: (!) 108/42 (!) 132/51 (!) 135/54 (!) 135/54  Pulse: 65 65 77 78  Resp: 13 14    Temp:  98 F (36.7 C)    TempSrc:  Oral    SpO2: 93% 95%    Weight:  104.7 kg    Height:  5\' 10"  (1.778 m)      Intake/Output Summary (Last 24 hours) at 09/11/2019 1321 Last data filed at 09/11/2019 1000 Gross per 24 hour  Intake  590 ml  Output 1355 ml  Net -765 ml   Filed Weights   09/10/19 1930 09/11/19 0520  Weight: 105.4 kg 104.7 kg    Exam: In bed, NAD rrr No increased work of breathing Moves all 4 ext A+OX3  Data Reviewed:   I have personally reviewed following labs and imaging studies:  Labs: Labs show the following:   Basic Metabolic Panel: Recent Labs  Lab 09/06/19 1115 09/10/19 1953 09/11/19 0132 09/11/19 1149  NA 138 133* 135  --   K 4.0 4.5 4.2  --   CL 101 103 101  --   CO2 23 20* 24  --   GLUCOSE 94 137* 152*  --   BUN 29* 33* 33*  --   CREATININE 1.23 1.29* 1.26*  --   CALCIUM 9.4 9.1 9.0  --   MG  --   --   --  2.2   GFR Estimated Creatinine Clearance: 55.7 mL/min (A) (by C-G formula based on SCr of 1.26 mg/dL (H)). Liver Function Tests: Recent Labs  Lab 09/10/19 1953  AST 27  ALT 19  ALKPHOS 80  BILITOT 1.5*  PROT 6.2*  ALBUMIN 3.8   No results for input(s): LIPASE, AMYLASE in the last 168 hours. No results for input(s): AMMONIA in the last 168 hours. Coagulation profile No results for input(s): INR, PROTIME in the last 168 hours.  CBC: Recent Labs  Lab 09/10/19 1932  WBC 8.1  HGB 12.6*  HCT 39.1  MCV 95.4  PLT 104*   Cardiac Enzymes: No results for input(s): CKTOTAL, CKMB, CKMBINDEX, TROPONINI in the last 168 hours. BNP (last 3 results) No results for input(s): PROBNP in the last 8760 hours. CBG: Recent Labs  Lab 09/10/19 1943  GLUCAP 134*   D-Dimer: No results for input(s): DDIMER in the last 72 hours. Hgb A1c: No results for input(s): HGBA1C in the last 72 hours. Lipid Profile: No results for input(s): CHOL, HDL, LDLCALC, TRIG, CHOLHDL, LDLDIRECT in the last 72 hours. Thyroid function studies: Recent Labs    09/10/19 2350  TSH 4.811*   Anemia work up: No results for input(s): VITAMINB12, FOLATE, FERRITIN, TIBC, IRON, RETICCTPCT in the last 72 hours. Sepsis Labs: Recent Labs  Lab 09/10/19 1932  WBC 8.1    Microbiology Recent  Results (from the past 240 hour(s))  SARS CORONAVIRUS 2 (TAT 6-24 HRS) Nasopharyngeal Nasopharyngeal Swab     Status: None   Collection Time: 09/10/19  9:12 PM   Specimen: Nasopharyngeal Swab  Result Value Ref Range Status   SARS Coronavirus 2 NEGATIVE NEGATIVE Final    Comment: (NOTE) SARS-CoV-2 target nucleic acids are NOT DETECTED. The SARS-CoV-2 RNA is generally detectable in upper and lower respiratory specimens during the acute phase of infection. Negative results do not preclude SARS-CoV-2 infection, do not rule out co-infections with other pathogens, and should not be used as the sole basis for treatment or other patient management decisions. Negative results must be combined with clinical observations, patient history, and epidemiological information.  The expected result is Negative. Fact Sheet for Patients: SugarRoll.be Fact Sheet for Healthcare Providers: https://www.woods-mathews.com/ This test is not yet approved or cleared by the [place] FDA and  has been authorized for detection and/or diagnosis of SARS-CoV-2 by FDA under an Emergency Use Authorization (EUA). This EUA will remain  in effect (meaning this test can be used) for the duration of the COVID-19 declaration under Section 56 4(b)(1) of the Act, 21 U.S.C. section 360bbb-3(b)(1), unless the authorization is terminated or revoked sooner. Performed at [facility], [place] [address] [place], [place] [address]     Procedures and diagnostic studies:  Dg Chest Port 1 View  Result Date: 09/10/2019 CLINICAL DATA:  Syncope EXAM: PORTABLE CHEST 1 VIEW COMPARISON:  April 22, 2019 FINDINGS: There is cardiomegaly. Pulmonary vascular congestion is seen. There is also increased interstitial markings seen throughout both lungs. No pleural effusion. Overlying median sternotomy wires are present. IMPRESSION: Cardiomegaly and interstitial edema. Electronically Signed   By: Prudencio Pair M.D.   On: 09/10/2019 20:53    Medications:   . allopurinol  300 mg Oral Daily  . amLODipine  10 mg Oral Daily  . aspirin EC  81 mg Oral QHS  . enoxaparin (LOVENOX) injection  40 mg Subcutaneous Daily  . famotidine  20 mg Oral Daily  . furosemide  60 mg Oral Daily  . metoprolol tartrate  25 mg Oral BID  . rosuvastatin  10 mg Oral QPM  . spironolactone  25 mg Oral Daily  . tamsulosin  0.4 mg Oral QPM   Continuous Infusions:   LOS: 0 days   Geradine Girt  [facility]   How to contact the [facility] Attending or Consulting provider [place] or covering provider during after hours [place], for this patient?  1. Check the care team in [facility] and look for a) attending/consulting [facility] provider listed and b) the [facility] team listed 2. Log into [email or web address] and use [facility] universal password to access. If you do not have the password, please contact the hospital operator. 3. Locate the [facility] provider you are looking for under [facility] and page to a number that you can be directly reached. 4. If you still have difficulty reaching the provider, please page the [facility] (Director on Call) for the Hospitalists listed on amion for assistance.  09/11/2019, 1:21 PM

## 2019-09-11 NOTE — ED Notes (Signed)
Pt. Stable and steady during ortho VS. Pt denies dizziness

## 2019-09-11 NOTE — ED Notes (Signed)
ED meal given to pt.

## 2019-09-11 NOTE — ED Notes (Signed)
Spoke to RN on 6E will return phone call in 10-15 minutes to give report

## 2019-09-11 NOTE — Progress Notes (Signed)
  Echocardiogram 2D Echocardiogram has been performed.  Andre Miranda 09/11/2019, 2:27 PM

## 2019-09-12 ENCOUNTER — Telehealth: Payer: Self-pay | Admitting: Interventional Cardiology

## 2019-09-12 ENCOUNTER — Inpatient Hospital Stay (HOSPITAL_COMMUNITY): Payer: Medicare Other

## 2019-09-12 ENCOUNTER — Other Ambulatory Visit: Payer: Self-pay | Admitting: *Deleted

## 2019-09-12 DIAGNOSIS — R55 Syncope and collapse: Secondary | ICD-10-CM

## 2019-09-12 LAB — ECHOCARDIOGRAM LIMITED
Height: 70 in
Weight: 3694.4 oz

## 2019-09-12 NOTE — Telephone Encounter (Signed)
Patient was treated by Dr. Harrington Challenger in the [facility] yesterday, and he states that Dr. Harrington Challenger made a mistake in her EKG report. The patient does not have an [place] heart valve, he has a different type

## 2019-09-12 NOTE — Discharge Summary (Addendum)
DISCHARGE SUMMARY     Physician Discharge Summary  Andre Miranda [id number] DOB: [birth year] DOA: 09/10/2019  PCP: Crist Infante, MD  Admit date: 09/10/2019 Discharge date: 09/12/2019  Admitted From: home Discharge disposition: home   Recommendations for Outpatient Follow-Up:   1. Close outpatient cardiology follow up   Discharge Diagnosis:   Active Problems:   Pre-syncope   Near syncope    Discharge Condition: Improved.  Diet recommendation: Low sodium, heart healthy  Wound care: None.  Code status: Full.   History of Present Illness:   Andre Miranda is a 83 y.o. male with medical history significant of CAD s/p CABG x4 in 2003, right carotid endarterectomy in 2009, aortic stenosis s/p TAVR with bioprosthetic valve in Q000111Q, chronic diastolic heart failure with EF 60-65% (04/2019), OSA on CPAP, history of prostate cancer status post radiation who presented with concerns of near syncope.    Patient reports that today he was in the kitchen standing and after talking with his PCP on the phone regarding some insomnia and chest tightness issues he is started to "slumped down towards the floor."  His wife saw him and helped him to the floor.  Denies any loss of consciousness.  Denies any symptoms of headache, vision changes, weakness, chest pain, palpitation, shortness of breath prior to the presyncopal episode.  No symptoms afterwards.  Patient reports that he has had ongoing chest tightness that is worse in the morning but resolves when he wakes up and goes about his day.  Patient recently had a follow-up with cardiology outpatient 4 days ago and was restarted on metoprolol 25 mg twice daily and low-dose ACE.  His metoprolol, ramipril and HCTZ were discontinued in his last admission back in June for acute CHF exacerbation due to bradycardia and hypotension. There was plans to evaluate whether medical therapy will help with his exertional chest pain and to  reevaluate in 2 weeks to discuss possible cardiac catheter if symptoms persist.   Hospital Course by Problem:   Near syncope:  No clear etiology. -has already worn a monitor -cardiology suspects that he had hypotension and advised lying down quickly if he feel poorly in the future  Hx AV replacment (TAVR):   Stable valve with moderate perivalvular leak.  No clear indication for etiology of presyncopel  CAD:   Minimally elevated but flat trop.    HTN:  BP controlled this morning.  Continue current therapy.   HL:    Continue statin.   Hyperlipidemia -Continue Crestor  Insomnia -Continue Restoril as needed  GERD -Continue Pepcid  History of prostate cancer s/p radiation treatment -Continue Flomax  OSA -Continue CPAP  Chronic thrombocytopenia - stable. No active bleeding  obesity Body mass index is 33.13 kg/m.   Medical Consultants:   cards   Discharge Exam:   Vitals:   09/12/19 0747 09/12/19 0945  BP: (!) 137/49 (!) 149/48  Pulse: 72 72  Resp:    Temp:    SpO2: 95%    Vitals:   09/11/19 2111 09/12/19 0451 09/12/19 0747 09/12/19 0945  BP: (!) 156/53 (!) 120/50 (!) 137/49 (!) 149/48  Pulse: 79 68 72 72  Resp:      Temp: 98.6 F (37 C) 98.6 F (37 C)    TempSrc: Oral Oral    SpO2: 94% 90% 95%   Weight:  103.7 kg    Height:        General exam: Appears calm and comfortable.   The results of significant diagnostics  from this hospitalization (including imaging, microbiology, ancillary and laboratory) are listed below for reference.     Procedures and Diagnostic Studies:   Dg Chest Port 1 View  Result Date: 09/10/2019 CLINICAL DATA:  Syncope EXAM: PORTABLE CHEST 1 VIEW COMPARISON:  April 22, 2019 FINDINGS: There is cardiomegaly. Pulmonary vascular congestion is seen. There is also increased interstitial markings seen throughout both lungs. No pleural effusion. Overlying median sternotomy wires are present. IMPRESSION: Cardiomegaly and  interstitial edema. Electronically Signed   By: Prudencio Pair M.D.   On: 09/10/2019 20:53     Labs:   Basic Metabolic Panel: Recent Labs  Lab 09/06/19 1115 09/10/19 1953 09/11/19 0132 09/11/19 1149  NA 138 133* 135  --   K 4.0 4.5 4.2  --   CL 101 103 101  --   CO2 23 20* 24  --   GLUCOSE 94 137* 152*  --   BUN 29* 33* 33*  --   CREATININE 1.23 1.29* 1.26*  --   CALCIUM 9.4 9.1 9.0  --   MG  --   --   --  2.2   GFR Estimated Creatinine Clearance: 55.5 mL/min (A) (by C-G formula based on SCr of 1.26 mg/dL (H)). Liver Function Tests: Recent Labs  Lab 09/10/19 1953  AST 27  ALT 19  ALKPHOS 80  BILITOT 1.5*  PROT 6.2*  ALBUMIN 3.8   No results for input(s): LIPASE, AMYLASE in the last 168 hours. No results for input(s): AMMONIA in the last 168 hours. Coagulation profile No results for input(s): INR, PROTIME in the last 168 hours.  CBC: Recent Labs  Lab 09/10/19 1932 09/11/19 1445  WBC 8.1 9.7  HGB 12.6* 12.7*  HCT 39.1 37.4*  MCV 95.4 91.0  PLT 104* 104*   Cardiac Enzymes: No results for input(s): CKTOTAL, CKMB, CKMBINDEX, TROPONINI in the last 168 hours. BNP: Invalid input(s): POCBNP CBG: Recent Labs  Lab 09/10/19 1943  GLUCAP 134*   D-Dimer No results for input(s): DDIMER in the last 72 hours. Hgb A1c No results for input(s): HGBA1C in the last 72 hours. Lipid Profile No results for input(s): CHOL, HDL, LDLCALC, TRIG, CHOLHDL, LDLDIRECT in the last 72 hours. Thyroid function studies Recent Labs    09/10/19 2350  TSH 4.811*   Anemia work up No results for input(s): VITAMINB12, FOLATE, FERRITIN, TIBC, IRON, RETICCTPCT in the last 72 hours. Microbiology Recent Results (from the past 240 hour(s))  SARS CORONAVIRUS 2 (TAT 6-24 HRS) Nasopharyngeal Nasopharyngeal Swab     Status: None   Collection Time: 09/10/19  9:12 PM   Specimen: Nasopharyngeal Swab  Result Value Ref Range Status   SARS Coronavirus 2 NEGATIVE NEGATIVE Final    Comment:  (NOTE) SARS-CoV-2 target nucleic acids are NOT DETECTED. The SARS-CoV-2 RNA is generally detectable in upper and lower respiratory specimens during the acute phase of infection. Negative results do not preclude SARS-CoV-2 infection, do not rule out co-infections with other pathogens, and should not be used as the sole basis for treatment or other patient management decisions. Negative results must be combined with clinical observations, patient history, and epidemiological information. The expected result is Negative. Fact Sheet for Patients: SugarRoll.be Fact Sheet for Healthcare Providers: https://www.woods-mathews.com/ This test is not yet approved or cleared by the [place] FDA and  has been authorized for detection and/or diagnosis of SARS-CoV-2 by FDA under an Emergency Use Authorization (EUA). This EUA will remain  in effect (meaning this test can be used) for the  duration of the COVID-19 declaration under Section 56 4(b)(1) of the Act, 21 U.S.C. section 360bbb-3(b)(1), unless the authorization is terminated or revoked sooner. Performed at [facility], [place] [address], [place], [place] [address]      Discharge Instructions:   Discharge Instructions    Diet - low sodium heart healthy   Complete by: As directed    Increase activity slowly   Complete by: As directed      Allergies as of 09/12/2019      Reactions   Ibuprofen Hypertension      Medication List    TAKE these medications   allopurinol 300 MG tablet Commonly known as: ZYLOPRIM Take 300 mg by mouth daily.   amLODipine 10 MG tablet Commonly known as: NORVASC TAKE 1 TABLET BY MOUTH ONCE DAILY   aspirin EC 81 MG tablet Take 81 mg by mouth at bedtime.   Centrum Silver tablet Take 1 tablet by mouth daily.   famotidine 20 MG tablet Commonly known as: PEPCID Take 20 mg by mouth daily.   furosemide 40 MG tablet Commonly known as: LASIX Take 1.5  tablets (60 mg total) by mouth daily.   metoprolol tartrate 25 MG tablet Commonly known as: LOPRESSOR Take 1 tablet (25 mg total) by mouth 2 (two) times daily.   ramipril 5 MG capsule Commonly known as: ALTACE Take 1 capsule (5 mg total) by mouth daily.   rosuvastatin 20 MG tablet Commonly known as: CRESTOR Take 10 mg by mouth every evening.   spironolactone 25 MG tablet Commonly known as: ALDACTONE TAKE 1 TABLET BY MOUTH ONCE DAILY. PLEASE KEEP UPCOMING APPT IN FEB FOR FUTURE REFILLS What changed: See the new instructions.   tamsulosin 0.4 MG Caps capsule Commonly known as: FLOMAX Take 0.4 mg by mouth every evening.   temazepam 30 MG capsule Commonly known as: RESTORIL Take 30 mg by mouth at bedtime as needed for sleep.   Vitamin D3 25 MCG (1000 UT) Caps Take 1 capsule by mouth at bedtime.      Follow-up Information    Crist Infante, MD Follow up in 1 week(s).   Specialty: Internal Medicine Contact information: [place] [address] [phone]        Belva Crome, MD Follow up.   Specialty: Cardiology Why: keep appointment with the NP Contact information: [address] [place] [address] [phone]            Time coordinating discharge: 25 min  Signed:  Geradine Girt DO  [facility] 09/12/2019, 11:38 AM

## 2019-09-12 NOTE — Telephone Encounter (Signed)
It has been corrected.

## 2019-09-12 NOTE — Consult Note (Signed)
   [facility] CM Inpatient Consult   09/12/2019  XION RUMFIELD [birth year] [id number]    Patient with Medicare/ [employer] A[employer], screened for 15% risk score for unplanned readmission/ hospitalization and to check if potential need for [facility] [employer] services.     Review of medical record shows as follows; Rawad Regan Giddingsis an 83 y.o.malewith medical history significant ofCAD s/p CABG x4 in 2003, right carotid endarterectomyin 2009, aortic stenosis s/p TAVR withbioprostheticvalvein Q000111Q, chronic diastolic heart failurewith EF 60-65% (04/2019), OSA on CPAP, history of prostate cancer status post radiation,    who presented with concerns of near syncope/ pre syncope.   Patient had transitioned to home prior to speaking with him.  Plan: Will follow with [facility] calls.  Please place a [facility] Care Management consult as appropriate and for questions, please contact:    Edwena Felty A. Maylon Sailors, BSN, RN-BC [facility] Liaison Cell: [phone]

## 2019-09-12 NOTE — Telephone Encounter (Signed)
Pt states that he noticed on some of his echo it is noted that he has an [place] heart valve.  Pt states this is incorrect.  He read a letter to me that he had gotten after his TAVR.  The letter stated that he could have received either an [employer]) or [employer].  The letter later stated that he did receive a 36mm [employer] valve.  He wanted to get the echo corrected so that the information is consistent in case a problem comes up in the future.  Advised I will send to Dr. Harrington Challenger and Dr. Radford Pax as it looks like Dr. Radford Pax read the echo.  Pt appreciative for call.

## 2019-09-12 NOTE — Progress Notes (Signed)
Progress Note  Patient Name: Andre Miranda Date of Encounter: 09/12/2019  Primary Cardiologist:   Sinclair Grooms, MD   Subjective   No pain.  No SOB.    Inpatient Medications    Scheduled Meds: . allopurinol  300 mg Oral Daily  . amLODipine  10 mg Oral Daily  . aspirin EC  81 mg Oral QHS  . enoxaparin (LOVENOX) injection  40 mg Subcutaneous Daily  . famotidine  20 mg Oral Daily  . furosemide  60 mg Oral Daily  . metoprolol tartrate  25 mg Oral BID  . rosuvastatin  10 mg Oral QPM  . spironolactone  25 mg Oral Daily  . tamsulosin  0.4 mg Oral QPM   Continuous Infusions:  PRN Meds: temazepam   Vital Signs    Vitals:   09/11/19 1435 09/11/19 1527 09/11/19 2111 09/12/19 0451  BP: (!) 135/54 (!) 130/52 (!) 156/53 (!) 120/50  Pulse: 68 73 79 68  Resp:  18    Temp:  98.1 F (36.7 C) 98.6 F (37 C) 98.6 F (37 C)  TempSrc:  Oral Oral Oral  SpO2: 96% 95% 94% 90%  Weight:    103.7 kg  Height:        Intake/Output Summary (Last 24 hours) at 09/12/2019 0716 Last data filed at 09/12/2019 0451 Gross per 24 hour  Intake 720 ml  Output 1895 ml  Net -1175 ml   Filed Weights   09/10/19 1930 09/11/19 0520 09/12/19 0451  Weight: 105.4 kg 104.7 kg 103.7 kg    Telemetry    NSR - Personally Reviewed  ECG    NA - Personally Reviewed  Physical Exam   GEN: No acute distress.   Neck: No  JVD Cardiac: RRR, 3/6 apical systolic murmurs, rubs, or gallops.  Respiratory: Clear  to auscultation bilaterally. GI: Soft, nontender, non-distended  MS: No  edema; No deformity. Neuro:  Nonfocal  Psych: Normal affect   Labs    Chemistry Recent Labs  Lab 09/06/19 1115 09/10/19 1953 09/11/19 0132  NA 138 133* 135  K 4.0 4.5 4.2  CL 101 103 101  CO2 23 20* 24  GLUCOSE 94 137* 152*  BUN 29* 33* 33*  CREATININE 1.23 1.29* 1.26*  CALCIUM 9.4 9.1 9.0  PROT  --  6.2*  --   ALBUMIN  --  3.8  --   AST  --  27  --   ALT  --  19  --   ALKPHOS  --  80  --    BILITOT  --  1.5*  --   GFRNONAA 55* 52* 53*  GFRAA 63 60* >60  ANIONGAP  --  10 10     Hematology Recent Labs  Lab 09/10/19 1932 09/11/19 1445  WBC 8.1 9.7  RBC 4.10* 4.11*  HGB 12.6* 12.7*  HCT 39.1 37.4*  MCV 95.4 91.0  MCH 30.7 30.9  MCHC 32.2 34.0  RDW 14.2 14.2  PLT 104* 104*    Cardiac EnzymesNo results for input(s): TROPONINI in the last 168 hours. No results for input(s): TROPIPOC in the last 168 hours.   BNP Recent Labs  Lab 09/10/19 1932  BNP 584.6*     DDimer No results for input(s): DDIMER in the last 168 hours.   Radiology    Dg Chest Port 1 View  Result Date: 09/10/2019 CLINICAL DATA:  Syncope EXAM: PORTABLE CHEST 1 VIEW COMPARISON:  April 22, 2019 FINDINGS: There is cardiomegaly. Pulmonary vascular  congestion is seen. There is also increased interstitial markings seen throughout both lungs. No pleural effusion. Overlying median sternotomy wires are present. IMPRESSION: Cardiomegaly and interstitial edema. Electronically Signed   By: Prudencio Pair M.D.   On: 09/10/2019 20:53    Cardiac Studies   ECHO:    1. Left ventricular ejection fraction, by visual estimation, is 60 to 65%. The left ventricle has normal function. Moderately increased left ventricular size. There is no left ventricular hypertrophy. 2. Global right ventricle has normal systolic function.The right ventricular size is normal. No increase in right ventricular wall thickness. 3. Left atrial size was normal. 4. Right atrial size was normal. 5. Moderate mitral annular calcification. No evidence of mitral valve regurgitation. Mild mitral stenosis. 6. The tricuspid valve is normal in structure. Tricuspid valve regurgitation is trivial. 7. Edwards Sapien bioprosthetic, stented aortic valve (TAVR) valve is present in the aortic position. Aortic valve area, by VTI measures 1.69 cm. Aortic valve mean gradient measures 17.2 mmHg. Aortic valve peak gradient measures 27.7 mmHg. There is  moderate perivalvular Aortic valve regurgitation by color flow Doppler. . 8. The pulmonic valve was normal in structure. Pulmonic valve regurgitation is trivial by color flow Doppler. 9. The inferior vena cava is normal in size with greater than 50% respiratory variability, suggesting right atrial pressure of 3 mmHg. 10. Moderate thickening of the mitral valve leaflet(s). 11. Left ventricular diastolic Doppler parameters are indeterminate pattern of LV diastolic filling.  Patient Profile     83 y.o. male  with a hx of CAD and CHF  who was being seen for the evaluation of near syncope at the request of Dr Eliseo Squires.   Assessment & Plan    Near syncope:  No clear etiology.  Echo as below.  He did have a monitor earlier this year.  I spoke with his out patient NP in our office and she will be seeing him early next week.  They can decide on repeat event monitoring.  I suspect that he had hypotension and we discussed lying down quickly if he feel poorly in the future.  He apparently has mild prodrome.  Discussed with the patient and his wife.   Hx AV replacment (TAVR):   Stable valve with moderate perivalvular leak.  No clear indication for etiology of presyncopel  CAD:   Minimally elevated but flat trop.   I will defer on cardiac cath.  This enzyme elevation and his presenting symptoms are not consistent with an ACS or arrhythmia related to ischemia.  He has otherwise not had unstable cardiac symptoms including no recent chest pain and volume has been OK.    HTN:  BP controlled this morning.  Continue current therapy.   HL:    Continue statin.   For questions or updates, please contact [place] Please consult [email or web address] for contact info under Cardiology/STEMI.   Signed, Minus Breeding, MD  09/12/2019, 7:16 AM

## 2019-09-12 NOTE — Progress Notes (Signed)
Carotid duplex       has been completed. Preliminary results can be found under CV proc through chart review. Malone Admire, BS, RDMS, RVT   

## 2019-09-12 NOTE — Progress Notes (Signed)
Reviewed all d/c instructions with patient and wife. Verbalized understanding.  IV d/ced.  Patient states he has all belongings.  Awaiting volunteer for d/c via [place].

## 2019-09-13 ENCOUNTER — Telehealth: Payer: Self-pay | Admitting: Interventional Cardiology

## 2019-09-13 DIAGNOSIS — R55 Syncope and collapse: Secondary | ICD-10-CM

## 2019-09-13 NOTE — Telephone Encounter (Signed)
Call transferred into triage - patient's wife reporting he passed out. Pt dc'd from hospital yesterday.  He went in Saturday evening after passing out at home. Walking back from mailbox this morning his chest got tight and he had an uneasy feeling and then he was on the driveway.  He hit his head.  It is scraped and so is one arm.  No acute injuries reported.    Reviewed with Truitt Merle, NP. She recommends: -no driving -30 day event monitor -stop lasix and aldactone -monitor for any neuro changes since he is on aspirin 81 mg -move upcoming appointment w/ her up   Called patient and wife back and reviewed all recommendations.  Last BP was 123/52, HR 62.  He continues to feel okay.appreicative for all information provided.

## 2019-09-13 NOTE — Telephone Encounter (Signed)
New message   Pt c/o Syncope: STAT if syncope occurred within 30 minutes and pt complains of lightheadedness High Priority if episode of passing out, completely, today or in last 24 hours   1. Did you pass out today?yes   2. When is the last time you passed out?per patient wife states that he just passed out within the last 30 minutes   3. Has this occurred multiple times? Yes    4. Did you have any symptoms prior to passing out?B/p is elevated 170/60 hr 70  143/55 after he passed out today, tightness in his chest

## 2019-09-13 NOTE — Telephone Encounter (Signed)
Pt called and has viewed the change to his Echo in My Chart.

## 2019-09-15 NOTE — Progress Notes (Addendum)
CARDIOLOGY OFFICE NOTE  Date:  09/19/2019    Andre Miranda Date of Birth: 1936-02-02 Medical Record [id number]  PCP:  Crist Infante, MD  Cardiologist:  Jennings Books    Chief Complaint  Patient presents with  . Follow-up    History of Present Illness: Andre Miranda is a 83 y.o. male who presents today for a work in visit. Seen for Dr. Tamala Julian. He is a former patient of Dr. Susa Simmonds that I used to see.    He has a history of known CAD with prior CABG in 2003, aortic stenosis s/p TAVR 2016 at [facility], hyperlipidemia,difficult to control HTN, and chronic diastolic HF.  He last saw Dr. Tamala Julian back in July - had had recent admission for acute diastolic HF. Endorsed mild DOE first thing most mornings and mild chest tightness if walks too long/too hard. He was felt to still have volume overload on exam - diuretics were increased - possible ischemic evaluation if symptoms did not improve.   I then saw him about 2 weeks ago - he had lost weight, had realized the need for salt restriction, still having chest tightness. He was still off beta blocker and ACE since that prior admission - I restarted medical therapy at reduced doses that included beta blocker and ACE. His BP and HR were trending up.   He has since been admitted with a "near" syncopal spell on 09/10/2019. It was suspected to be due to hypotension. Does not appear his medicines were changed.   He then called the office last Tuesday - had been home less than 24 hours - had had recurrent spell - did pass out while walking back from the mailbox. His chest had got tight. He hit his head. Situation was discussed with me - advised no driving, event monitor, stop Lasix, stop Aldactone, monitor for neuro changes and move up his appointment with me.   The patient and his wife do not have symptoms concerning for COVID-19 infection (fever, chills, cough, or new shortness of breath).   Comes in today. Here with his wife. He  tells me that after his last visit with me, he thought he did feel better with less chest tightness. BP improved. He was seen by me on 10/20 - then on 10/24 had went to pick up take out - was in the kitchen taking the food out of the bag - and "just went down" - wife saw this out of the corner of her eye - she called 911 - she notes there was actual LOC. He had maybe a second or two warning last Tuesday that "something was going to happen" - then he ended up beside the car and knew he had passed out. They called here and thus diuretics were stopped, advised to not drive and order placed for monitor to be placed along with my appointment to see him today.   Wife feels like he has just not been himself for several months - at times his color looks ashen - she is quite worried.   Does also note some cough - probably ACE related.   Past Medical History:  Diagnosis Date  . Aortic stenosis    MODERATE  . Cancer [facility])    prostate cancer-radiation  . Carotid arterial disease ([place])   . Coronary artery disease   . Dysuria   . GERD (gastroesophageal reflux disease)   . Gout   . Hematuria   . History of urinary retention   .  Hyperlipidemia   . Hypertension   . Obesity   . Sleep apnea    uses CPAP nightly    Past Surgical History:  Procedure Laterality Date  . AORTIC VALVE REPAIR  February 06, 2015   Dr. Aline Brochure and Dr. Ysidro Evert  at [facility]  . [place]  02/2008  . CHOLECYSTECTOMY  04/2002  . CORONARY ARTERY BYPASS GRAFT  2003   LIMA TO THE LAD, RIMA TO THE RCA, AND A SAPHENOUS VEIN GRAFT TO THE INTERMEDIATE AND DISTAL LEFT CIRCUMFLEX  . ORIF ANKLE FRACTURE Right 02/14/2019   Procedure: OPEN REDUCTION INTERNAL FIXATION (ORIF) ANKLE FRACTURE;  Surgeon: Netta Cedars, MD;  Location: [place];  Service: Orthopedics;  Laterality: Right;  . US ECHOCARDIOGRAPHY  08/2009   SHOWED A MEAN GRADIENT ACROSS IS AORTIC VALVE OF 24 MM OF MERCURY. HE HAD MODERATE LVH AND NORMAL  LV FUNCTION     Medications: Current Meds  Medication Sig  . allopurinol (ZYLOPRIM) 300 MG tablet Take 300 mg by mouth daily.    Marland Kitchen amLODipine (NORVASC) 10 MG tablet TAKE 1 TABLET BY MOUTH ONCE DAILY  . aspirin EC 81 MG tablet Take 81 mg by mouth at bedtime.   . Cholecalciferol (VITAMIN D3) 1000 UNITS CAPS Take 1 capsule by mouth at bedtime.   . famotidine (PEPCID) 20 MG tablet Take 20 mg by mouth daily.    . metoprolol tartrate (LOPRESSOR) 25 MG tablet Take 1 tablet (25 mg total) by mouth 2 (two) times daily.  . Multiple Vitamins-Minerals (CENTRUM SILVER) tablet Take 1 tablet by mouth daily.    . ramipril (ALTACE) 5 MG capsule Take 1 capsule (5 mg total) by mouth daily.  . rosuvastatin (CRESTOR) 20 MG tablet Take 10 mg by mouth every evening.  . Tamsulosin HCl (FLOMAX) 0.4 MG CAPS Take 0.4 mg by mouth every evening.   . temazepam (RESTORIL) 30 MG capsule Take 30 mg by mouth at bedtime as needed for sleep.     Allergies: Allergies  Allergen Reactions  . Ibuprofen Hypertension    Social History: The patient  reports that he quit smoking about 48 years ago. His smoking use included cigarettes. He has a 10.50 pack-year smoking history. He has never used smokeless tobacco. He reports current alcohol use. He reports that he does not use drugs.   Family History: The patient's family history includes Heart attack in his father; Pneumonia (age of onset: 5) in his mother.   Review of Systems: Please see the history of present illness.   All other systems are reviewed and negative.   Physical Exam: VS:  BP (!) 122/52   Pulse 69   Ht 5\' 10"  (1.778 m)   Wt 238 lb 1.9 oz (108 kg)   SpO2 95%   BMI 34.17 kg/m  .  BMI Body mass index is 34.17 kg/m.  Wt Readings from Last 3 Encounters:  09/19/19 238 lb 1.9 oz (108 kg)  09/12/19 228 lb 9.6 oz (103.7 kg)  09/06/19 232 lb 6.4 oz (105.4 kg)   Bp lying is 110/54 and HR is 66 BP sitting is 107/49 with HR 69 Standing BP is 120/51 with HR  74 Standing BP at 3 minutes is 124/55 with HR 73.   General: Pleasant. Elderly. Alert and in no acute distress.   HEENT: Normal.  Neck: Supple, no JVD, carotid bruits, or masses noted.  Cardiac: Regular rate and rhythm. Loud holosystolic murmur. No edema.  Respiratory:  Lungs are clear to  auscultation bilaterally with normal work of breathing.  GI: Soft and nontender.  MS: No deformity or atrophy. Gait and ROM intact.  Skin: Warm and dry. Color is normal.  Neuro:  Strength and sensation are intact and no gross focal deficits noted.  Psych: Alert, appropriate and with normal affect.   LABORATORY DATA:  EKG:  EKG is ordered today. This demonstrates NSR with bifascicular block with 1st degree AV block. Reviewed with Dr. Tamala Julian here in the office.   Lab Results  Component Value Date   WBC 9.7 09/11/2019   HGB 12.7 (L) 09/11/2019   HCT 37.4 (L) 09/11/2019   PLT 104 (L) 09/11/2019   GLUCOSE 152 (H) 09/11/2019   CHOL 90 04/23/2019   TRIG 79 04/23/2019   HDL 32 (L) 04/23/2019   LDLCALC 42 04/23/2019   ALT 19 09/10/2019   AST 27 09/10/2019   NA 135 09/11/2019   K 4.2 09/11/2019   CL 101 09/11/2019   CREATININE 1.26 (H) 09/11/2019   BUN 33 (H) 09/11/2019   CO2 24 09/11/2019   TSH 4.811 (H) 09/10/2019   INR 1.0 03/09/2008   HGBA1C 5.4 04/22/2019     BNP (last 3 results) Recent Labs    04/22/19 1504 09/10/19 1932  BNP 813.2* 584.6*    ProBNP (last 3 results) No results for input(s): PROBNP in the last 8760 hours.   Other Studies Reviewed Today:  Carotid Doppler Summary 08/2019: Right Carotid: Velocities in the right ICA are consistent with a 1-39% stenosis.  Left Carotid: Velocities in the left ICA are consistent with a 1-39% stenosis.   *See table(s) above for measurements and observations.   Electronically signed by Ruta Hinds MD on 09/12/2019 at 6:39:37 PM.    LIMITED ECHO IMPRESSIONS 08/2019  1. Left ventricular ejection fraction, by visual  estimation, is 60 to 65%. The left ventricle has normal function. Moderately increased left ventricular size. There is no left ventricular hypertrophy.  2. Left ventricular diastolic Doppler parameters are indeterminate pattern of LV diastolic filling.  3. Global right ventricle has normal systolic function.The right ventricular size is normal. No increase in right ventricular wall thickness.  4. Left atrial size was normal.  5. Right atrial size was normal.  6. Moderate mitral annular calcification. Moderate thickening of the mitral valve leaflet(s). No evidence of mitral valve regurgitation. Mild mitral stenosis.  7. The tricuspid valve is normal in structure. Tricuspid valve regurgitation is trivial.  8. S/p TAVR with 71mm Direct Flow Medical valve valve is present in the aortic position. Procedure Date: 2016. Aortic valve area, by VTI measures 1.69 cm. Aortic valve mean gradient measures 17.2 mmHg. Aortic valve peak gradient measures 27.7 mmHg.  There is moderate perivalvular Aortic valve regurgitation by color flow Doppler.  9. The pulmonic valve was normal in structure. Pulmonic valve regurgitation is trivial by color flow Doppler. 10. The inferior vena cava is normal in size with greater than 50% respiratory variability, suggesting right atrial pressure of 3 mmHg.   ECHOCARDIOGRAM 04/2019 IMPRESSIONS  1. The left ventricle has normal systolic function with an ejection fraction of 60-65%. The cavity size was normal. There is mildly increased left ventricular wall thickness. Left ventricular diastolic Doppler parameters are consistent with impaired  relaxation. 2. Left atrial size was mildly dilated. 3. The mitral valve is abnormal. Mild thickening of the mitral valve leaflet. There is mild mitral annular calcification present. 4. The tricuspid valve is grossly normal. 5. A bioprosthesis valve is present in the aortic  position. Procedure Date: 2016. 6. The aortic root and ascending  aorta are normal in size and structure. 7. The interatrial septum was not assessed.  Monitor 12/2018: Study Highlights   The predominant heart rhythm is normal sinus rhythm  Frequent predominantly isolated PVCs with occasional ventricular bigeminy and 3 beat runs. PVC burden approximately 10% over the 24-hour timeframe.  Rare PACs  Sinus bradycardia during sleep into the 40s.  No atrial fibrillation  No pauses greater than 3 seconds.     Assessment/Plan:  1. Syncope - has had 2 spells over the past 2 weeks - now with bifascicular block with 1st degree AV block on EKG - discussed with Dr. Tamala Julian here in the office today - we will let his medicines stay as they are (which does include beta blocker therapy) - his event monitor has not arrived to his house yet - we feel it is not acceptable for him to leave this office without a monitor actually in place due to his high risk for recurrence with high risk for injury. Zio patch is applied today. If he does not have a recurrent spell of syncope in the next 2 weeks - we will proceed on with the event monitor as we need to rule out arrhythmia as the etiology for his syncope. This will give [place] about 6 weeks of monitoring capability. Probable need for PPM is apparent and he is aware. He is to not drive, not engage in using heavy equipment, stay off the roof and to walk when accompanied by another person.   2. Chest pain - Chest tightness with exertion - known CAD - remote CABG per [facility] in 2003 - he is now back on low dose Metoprolol - seems like this has helped and he has had less chest tightness - discussed with Dr. Tamala Julian - we will continue this for now. For now, will hold on cardiac cath but this may still need to be entertained.   3. Chronic diastolic HF - restricting salt - diuretics are on hold. He was not orthostatic today so I suspect the use of his diuretics is not the etiology for his syncope. We will follow.   4. AS with prior TAVR  - most recent echo noted - will follow.   5. HTN - BP so far is ok - probably has ACE cough - will need to address going forward.   6. HLD - on Crestor  7. Carotid disease - study just repeated and still shows 1 to 39% bilateral disease -   8. COVID-19 Education: The signs and symptoms of COVID-19 were discussed with the patient and how to seek care for testing (follow up with PCP or arrange E-visit).  The importance of social distancing, staying at home, hand hygiene and wearing a mask when out in public were discussed today.  Current medicines are reviewed with the patient today.  The patient does not have concerns regarding medicines other than what has been noted above.  The following changes have been made:  See above.  Labs/ tests ordered today include:    Orders Placed This Encounter  Procedures  . Basic metabolic panel  . TSH  . LONG TERM MONITOR (3-14 DAYS)  . EKG 12-Lead     Disposition:   FU with [place] in about 2 and 1/2 weeks.   Patient is agreeable to this plan and will call if any problems develop in the interim.   SignedTruitt Merle, NP  09/19/2019 12:13 PM  [facility] [address] [place], [place]  [address] Phone: [phone] Fax: [phone]

## 2019-09-19 ENCOUNTER — Ambulatory Visit (INDEPENDENT_AMBULATORY_CARE_PROVIDER_SITE_OTHER): Payer: Medicare Other | Admitting: Nurse Practitioner

## 2019-09-19 ENCOUNTER — Ambulatory Visit (INDEPENDENT_AMBULATORY_CARE_PROVIDER_SITE_OTHER): Payer: Medicare Other

## 2019-09-19 ENCOUNTER — Telehealth: Payer: Self-pay

## 2019-09-19 ENCOUNTER — Encounter: Payer: Self-pay | Admitting: Nurse Practitioner

## 2019-09-19 ENCOUNTER — Other Ambulatory Visit: Payer: Self-pay

## 2019-09-19 VITALS — BP 122/52 | HR 69 | Ht 70.0 in | Wt 238.1 lb

## 2019-09-19 DIAGNOSIS — I701 Atherosclerosis of renal artery: Secondary | ICD-10-CM

## 2019-09-19 DIAGNOSIS — R7989 Other specified abnormal findings of blood chemistry: Secondary | ICD-10-CM | POA: Diagnosis not present

## 2019-09-19 DIAGNOSIS — R55 Syncope and collapse: Secondary | ICD-10-CM

## 2019-09-19 NOTE — Telephone Encounter (Signed)
Applied ZIO AT today during o/v with LG. Ordered [employer] Event monitor to be mailed to pt's home. Went over all instructions for both monitors today. Verified address.   Pt will wear ZIO AT for 14 days then Event monitor for 30 days.   All of our Event monitors in the office were out of date. This is why he started with the ZIO AT per LG and HS.

## 2019-09-19 NOTE — Patient Instructions (Addendum)
After Visit Summary:  We will be checking the following labs today - BMET, TSH   Medication Instructions:    Continue with your current medicines.    If you need a refill on your cardiac medications before your next appointment, please call your pharmacy.     Testing/Procedures To Be Arranged:  N/A  Follow-Up:   See me in about 2 to 3 weeks.     At [facility], you and your health needs are our priority.  As part of our continuing mission to provide you with exceptional heart care, we have created designated Provider Care Teams.  These Care Teams include your primary Cardiologist (physician) and Advanced Practice Providers (APPs -  Physician Assistants and Nurse Practitioners) who all work together to provide you with the care you need, when you need it.  Special Instructions:  . Stay safe, stay home, wash your hands for at least 20 seconds and wear a mask when out in public.  . It was good to talk with you today.  Marland Kitchen Keep restricting salt and weighing daily.  . Continue to monitor your BP and HR for [place].    Call the [place] office at [phone] if you have any questions, problems or concerns.

## 2019-09-20 ENCOUNTER — Other Ambulatory Visit: Payer: Self-pay | Admitting: *Deleted

## 2019-09-20 DIAGNOSIS — E785 Hyperlipidemia, unspecified: Secondary | ICD-10-CM | POA: Diagnosis not present

## 2019-09-20 DIAGNOSIS — R946 Abnormal results of thyroid function studies: Secondary | ICD-10-CM | POA: Diagnosis not present

## 2019-09-20 DIAGNOSIS — C61 Malignant neoplasm of prostate: Secondary | ICD-10-CM | POA: Diagnosis not present

## 2019-09-20 DIAGNOSIS — R7989 Other specified abnormal findings of blood chemistry: Secondary | ICD-10-CM

## 2019-09-20 DIAGNOSIS — I509 Heart failure, unspecified: Secondary | ICD-10-CM | POA: Diagnosis not present

## 2019-09-20 DIAGNOSIS — K219 Gastro-esophageal reflux disease without esophagitis: Secondary | ICD-10-CM | POA: Diagnosis not present

## 2019-09-20 DIAGNOSIS — I2510 Atherosclerotic heart disease of native coronary artery without angina pectoris: Secondary | ICD-10-CM | POA: Diagnosis not present

## 2019-09-20 DIAGNOSIS — I110 Hypertensive heart disease with heart failure: Secondary | ICD-10-CM | POA: Diagnosis not present

## 2019-09-20 DIAGNOSIS — D696 Thrombocytopenia, unspecified: Secondary | ICD-10-CM | POA: Diagnosis not present

## 2019-09-20 DIAGNOSIS — G4733 Obstructive sleep apnea (adult) (pediatric): Secondary | ICD-10-CM | POA: Diagnosis not present

## 2019-09-20 DIAGNOSIS — Z952 Presence of prosthetic heart valve: Secondary | ICD-10-CM | POA: Diagnosis not present

## 2019-09-20 DIAGNOSIS — E039 Hypothyroidism, unspecified: Secondary | ICD-10-CM

## 2019-09-20 DIAGNOSIS — G4700 Insomnia, unspecified: Secondary | ICD-10-CM | POA: Diagnosis not present

## 2019-09-20 DIAGNOSIS — R55 Syncope and collapse: Secondary | ICD-10-CM | POA: Diagnosis not present

## 2019-09-20 LAB — BASIC METABOLIC PANEL
BUN/Creatinine Ratio: 20 (ref 10–24)
BUN: 25 mg/dL (ref 8–27)
CO2: 21 mmol/L (ref 20–29)
Calcium: 9.4 mg/dL (ref 8.6–10.2)
Chloride: 104 mmol/L (ref 96–106)
Creatinine, Ser: 1.27 mg/dL (ref 0.76–1.27)
GFR calc Af Amer: 61 mL/min/{1.73_m2} (ref >59)
GFR calc non Af Amer: 53 mL/min/{1.73_m2} — ABNORMAL LOW (ref >59)
Glucose: 88 mg/dL (ref 65–99)
Potassium: 4.8 mmol/L (ref 3.5–5.2)
Sodium: 137 mmol/L (ref 134–144)

## 2019-09-20 LAB — TSH: TSH: 7.6 u[IU]/mL — ABNORMAL HIGH (ref 0.450–4.500)

## 2019-09-20 MED ORDER — LEVOTHYROXINE SODIUM 25 MCG PO TABS: 25.0000 ug | ORAL_TABLET | Freq: Every day | ORAL | 3 refills | Status: DC

## 2019-09-20 NOTE — Progress Notes (Signed)
error 

## 2019-09-21 ENCOUNTER — Telehealth: Payer: Self-pay | Admitting: Interventional Cardiology

## 2019-09-21 ENCOUNTER — Telehealth: Payer: Self-pay | Admitting: Nurse Practitioner

## 2019-09-21 ENCOUNTER — Encounter: Payer: Self-pay | Admitting: *Deleted

## 2019-09-21 DIAGNOSIS — R946 Abnormal results of thyroid function studies: Secondary | ICD-10-CM | POA: Diagnosis not present

## 2019-09-21 DIAGNOSIS — E7849 Other hyperlipidemia: Secondary | ICD-10-CM | POA: Diagnosis not present

## 2019-09-21 NOTE — Telephone Encounter (Signed)
Mr. Flam needs to be set up for cardiac cath to r/o progression of CAD.

## 2019-09-21 NOTE — Telephone Encounter (Signed)
New Message   Patient is calling because he is wearing a monitor. He states that he was instructed if he had an episode to call in. Last night he had an episode of shortness of breath and some chest tightness. While speaking with the patient his did advise that he cell was dying and to just have the nurse call him back at home.

## 2019-09-21 NOTE — Telephone Encounter (Signed)
Called pt and made him aware of instructions for heart cath.  Scheduled COVID screening for Saturday.  Released instructions to MyChart.  Pt verbalized understanding and was appreciative for call.

## 2019-09-21 NOTE — Telephone Encounter (Signed)
Spoke with pt and made him aware of Dr. Thompson Caul recommendation.  Pt verbalized understanding and was in agreement with plan.  Advised I will call to set up everything and call him later to go over instructions.

## 2019-09-21 NOTE — Telephone Encounter (Signed)
Patent reporting his chest was tight and he became SOB walking from recliner to bathroom.  The episode was around 9:45 pm last around 15-20 min.  At 10pm BP was 200/74, HR 92   BP stayed elevated in 160s/170s until last reading at 12:15 am.  Hands noted to be shaking at this time and continues today.  Today he feels well, just fine.  BP today is 115/50, HR 60.  Checked monitor report.  Pt did not record any symptoms to monitor.

## 2019-09-21 NOTE — Telephone Encounter (Signed)
Patient returning call.

## 2019-09-21 NOTE — Telephone Encounter (Signed)
Left message to call back  

## 2019-09-23 ENCOUNTER — Other Ambulatory Visit: Payer: Self-pay

## 2019-09-23 ENCOUNTER — Emergency Department (HOSPITAL_COMMUNITY): Payer: Medicare Other

## 2019-09-23 ENCOUNTER — Inpatient Hospital Stay (HOSPITAL_COMMUNITY)
Admission: EM | Admit: 2019-09-23 | Discharge: 2019-09-27 | DRG: 286 | Disposition: A | Discharge status: Home / Self Care | Payer: Medicare Other | Attending: Internal Medicine | Admitting: Internal Medicine

## 2019-09-23 ENCOUNTER — Telehealth: Payer: Self-pay | Admitting: Interventional Cardiology

## 2019-09-23 DIAGNOSIS — I2510 Atherosclerotic heart disease of native coronary artery without angina pectoris: Secondary | ICD-10-CM | POA: Diagnosis not present

## 2019-09-23 DIAGNOSIS — E7800 Pure hypercholesterolemia, unspecified: Secondary | ICD-10-CM

## 2019-09-23 DIAGNOSIS — I509 Heart failure, unspecified: Secondary | ICD-10-CM

## 2019-09-23 DIAGNOSIS — Z87891 Personal history of nicotine dependence: Secondary | ICD-10-CM

## 2019-09-23 DIAGNOSIS — I452 Bifascicular block: Secondary | ICD-10-CM | POA: Diagnosis not present

## 2019-09-23 DIAGNOSIS — I110 Hypertensive heart disease with heart failure: Secondary | ICD-10-CM | POA: Diagnosis not present

## 2019-09-23 DIAGNOSIS — I25118 Atherosclerotic heart disease of native coronary artery with other forms of angina pectoris: Secondary | ICD-10-CM | POA: Diagnosis not present

## 2019-09-23 DIAGNOSIS — K6289 Other specified diseases of anus and rectum: Secondary | ICD-10-CM | POA: Diagnosis present

## 2019-09-23 DIAGNOSIS — R778 Other specified abnormalities of plasma proteins: Secondary | ICD-10-CM

## 2019-09-23 DIAGNOSIS — Z8546 Personal history of malignant neoplasm of prostate: Secondary | ICD-10-CM

## 2019-09-23 DIAGNOSIS — D649 Anemia, unspecified: Secondary | ICD-10-CM | POA: Diagnosis present

## 2019-09-23 DIAGNOSIS — I5033 Acute on chronic diastolic (congestive) heart failure: Secondary | ICD-10-CM | POA: Diagnosis present

## 2019-09-23 DIAGNOSIS — I350 Nonrheumatic aortic (valve) stenosis: Secondary | ICD-10-CM

## 2019-09-23 DIAGNOSIS — Z86010 Personal history of colonic polyps: Secondary | ICD-10-CM

## 2019-09-23 DIAGNOSIS — K2980 Duodenitis without bleeding: Secondary | ICD-10-CM | POA: Diagnosis present

## 2019-09-23 DIAGNOSIS — K2970 Gastritis, unspecified, without bleeding: Secondary | ICD-10-CM | POA: Diagnosis present

## 2019-09-23 DIAGNOSIS — K635 Polyp of colon: Secondary | ICD-10-CM | POA: Diagnosis present

## 2019-09-23 DIAGNOSIS — Z20828 Contact with and (suspected) exposure to other viral communicable diseases: Secondary | ICD-10-CM | POA: Diagnosis not present

## 2019-09-23 DIAGNOSIS — Z79899 Other long term (current) drug therapy: Secondary | ICD-10-CM

## 2019-09-23 DIAGNOSIS — I440 Atrioventricular block, first degree: Secondary | ICD-10-CM | POA: Diagnosis present

## 2019-09-23 DIAGNOSIS — R0789 Other chest pain: Secondary | ICD-10-CM | POA: Diagnosis not present

## 2019-09-23 DIAGNOSIS — K5730 Diverticulosis of large intestine without perforation or abscess without bleeding: Secondary | ICD-10-CM | POA: Diagnosis not present

## 2019-09-23 DIAGNOSIS — K644 Residual hemorrhoidal skin tags: Secondary | ICD-10-CM | POA: Diagnosis present

## 2019-09-23 DIAGNOSIS — G4700 Insomnia, unspecified: Secondary | ICD-10-CM | POA: Diagnosis present

## 2019-09-23 DIAGNOSIS — I4510 Unspecified right bundle-branch block: Secondary | ICD-10-CM | POA: Diagnosis not present

## 2019-09-23 DIAGNOSIS — R0600 Dyspnea, unspecified: Secondary | ICD-10-CM | POA: Diagnosis present

## 2019-09-23 DIAGNOSIS — I2583 Coronary atherosclerosis due to lipid rich plaque: Secondary | ICD-10-CM

## 2019-09-23 DIAGNOSIS — I5031 Acute diastolic (congestive) heart failure: Secondary | ICD-10-CM | POA: Diagnosis not present

## 2019-09-23 DIAGNOSIS — K921 Melena: Secondary | ICD-10-CM | POA: Diagnosis not present

## 2019-09-23 DIAGNOSIS — K648 Other hemorrhoids: Secondary | ICD-10-CM | POA: Diagnosis present

## 2019-09-23 DIAGNOSIS — K219 Gastro-esophageal reflux disease without esophagitis: Secondary | ICD-10-CM | POA: Diagnosis present

## 2019-09-23 DIAGNOSIS — Z886 Allergy status to analgesic agent status: Secondary | ICD-10-CM

## 2019-09-23 DIAGNOSIS — Z951 Presence of aortocoronary bypass graft: Secondary | ICD-10-CM

## 2019-09-23 DIAGNOSIS — G4733 Obstructive sleep apnea (adult) (pediatric): Secondary | ICD-10-CM | POA: Diagnosis present

## 2019-09-23 DIAGNOSIS — J90 Pleural effusion, not elsewhere classified: Secondary | ICD-10-CM | POA: Diagnosis not present

## 2019-09-23 DIAGNOSIS — R0902 Hypoxemia: Secondary | ICD-10-CM | POA: Diagnosis not present

## 2019-09-23 DIAGNOSIS — Z952 Presence of prosthetic heart valve: Secondary | ICD-10-CM

## 2019-09-23 DIAGNOSIS — Z8249 Family history of ischemic heart disease and other diseases of the circulatory system: Secondary | ICD-10-CM

## 2019-09-23 DIAGNOSIS — R0602 Shortness of breath: Secondary | ICD-10-CM | POA: Diagnosis not present

## 2019-09-23 DIAGNOSIS — D696 Thrombocytopenia, unspecified: Secondary | ICD-10-CM | POA: Diagnosis present

## 2019-09-23 DIAGNOSIS — Z7982 Long term (current) use of aspirin: Secondary | ICD-10-CM

## 2019-09-23 DIAGNOSIS — Z6832 Body mass index (BMI) 32.0-32.9, adult: Secondary | ICD-10-CM

## 2019-09-23 DIAGNOSIS — E669 Obesity, unspecified: Secondary | ICD-10-CM | POA: Diagnosis present

## 2019-09-23 DIAGNOSIS — I25119 Atherosclerotic heart disease of native coronary artery with unspecified angina pectoris: Secondary | ICD-10-CM

## 2019-09-23 DIAGNOSIS — Z9049 Acquired absence of other specified parts of digestive tract: Secondary | ICD-10-CM

## 2019-09-23 DIAGNOSIS — R42 Dizziness and giddiness: Secondary | ICD-10-CM | POA: Diagnosis not present

## 2019-09-23 DIAGNOSIS — M109 Gout, unspecified: Secondary | ICD-10-CM | POA: Diagnosis present

## 2019-09-23 DIAGNOSIS — I10 Essential (primary) hypertension: Secondary | ICD-10-CM | POA: Diagnosis not present

## 2019-09-23 DIAGNOSIS — E785 Hyperlipidemia, unspecified: Secondary | ICD-10-CM | POA: Diagnosis present

## 2019-09-23 DIAGNOSIS — R55 Syncope and collapse: Secondary | ICD-10-CM | POA: Diagnosis not present

## 2019-09-23 DIAGNOSIS — Z923 Personal history of irradiation: Secondary | ICD-10-CM

## 2019-09-23 LAB — BASIC METABOLIC PANEL
Anion gap: 11 (ref 5–15)
BUN: 20 mg/dL (ref 8–23)
CO2: 21 mmol/L — ABNORMAL LOW (ref 22–32)
Calcium: 9.3 mg/dL (ref 8.9–10.3)
Chloride: 102 mmol/L (ref 98–111)
Creatinine, Ser: 1.09 mg/dL (ref 0.61–1.24)
GFR calc Af Amer: >60 mL/min (ref >60)
GFR calc non Af Amer: >60 mL/min (ref >60)
Glucose, Bld: 112 mg/dL — ABNORMAL HIGH (ref 70–99)
Potassium: 4.5 mmol/L (ref 3.5–5.1)
Sodium: 134 mmol/L — ABNORMAL LOW (ref 135–145)

## 2019-09-23 LAB — CBC WITH DIFFERENTIAL/PLATELET
Abs Immature Granulocytes: 0.03 10*3/uL (ref 0.00–0.07)
Basophils Absolute: 0.1 10*3/uL (ref 0.0–0.1)
Basophils Relative: 1 %
Eosinophils Absolute: 0.1 10*3/uL (ref 0.0–0.5)
Eosinophils Relative: 1 %
HCT: 40.1 % (ref 39.0–52.0)
Hemoglobin: 12.9 g/dL — ABNORMAL LOW (ref 13.0–17.0)
Immature Granulocytes: 0 %
Lymphocytes Relative: 11 %
Lymphs Abs: 1 10*3/uL (ref 0.7–4.0)
MCH: 30.8 pg (ref 26.0–34.0)
MCHC: 32.2 g/dL (ref 30.0–36.0)
MCV: 95.7 fL (ref 80.0–100.0)
Monocytes Absolute: 0.6 10*3/uL (ref 0.1–1.0)
Monocytes Relative: 6 %
Neutro Abs: 7.1 10*3/uL (ref 1.7–7.7)
Neutrophils Relative %: 81 %
Platelets: 117 10*3/uL — ABNORMAL LOW (ref 150–400)
RBC: 4.19 MIL/uL — ABNORMAL LOW (ref 4.22–5.81)
RDW: 14.6 % (ref 11.5–15.5)
WBC: 8.9 10*3/uL (ref 4.0–10.5)
nRBC: 0 % (ref 0.0–0.2)

## 2019-09-23 LAB — CBC
HCT: 40.4 % (ref 39.0–52.0)
Hemoglobin: 13.2 g/dL (ref 13.0–17.0)
MCH: 30.6 pg (ref 26.0–34.0)
MCHC: 32.7 g/dL (ref 30.0–36.0)
MCV: 93.5 fL (ref 80.0–100.0)
Platelets: 115 10*3/uL — ABNORMAL LOW (ref 150–400)
RBC: 4.32 MIL/uL (ref 4.22–5.81)
RDW: 14.5 % (ref 11.5–15.5)
WBC: 7.7 10*3/uL (ref 4.0–10.5)
nRBC: 0 % (ref 0.0–0.2)

## 2019-09-23 LAB — CREATININE, SERUM
Creatinine, Ser: 1.17 mg/dL (ref 0.61–1.24)
GFR calc Af Amer: >60 mL/min (ref >60)
GFR calc non Af Amer: 58 mL/min — ABNORMAL LOW (ref >60)

## 2019-09-23 LAB — SARS CORONAVIRUS 2 (TAT 6-24 HRS): SARS Coronavirus 2: NEGATIVE

## 2019-09-23 LAB — BRAIN NATRIURETIC PEPTIDE: B Natriuretic Peptide: 581.7 pg/mL — ABNORMAL HIGH (ref 0.0–100.0)

## 2019-09-23 LAB — TROPONIN I (HIGH SENSITIVITY)
Troponin I (High Sensitivity): 150 ng/L (ref <18)
Troponin I (High Sensitivity): 133 ng/L (ref <18)

## 2019-09-23 LAB — POC OCCULT BLOOD, ED: Fecal Occult Bld: POSITIVE — AB

## 2019-09-23 MED ORDER — ONDANSETRON HCL 4 MG/2ML IJ SOLN
4.0000 mg | Freq: Four times a day (QID) | INTRAMUSCULAR | Status: DC | PRN
  Administered 2019-09-26: 4 mg via INTRAVENOUS

## 2019-09-23 MED ORDER — ADULT MULTIVITAMIN W/MINERALS CH
1.0000 | ORAL_TABLET | Freq: Every day | ORAL | Status: DC
  Administered 2019-09-24 – 2019-09-27 (×4): 1 via ORAL
  Filled 2019-09-23 (×4): qty 1

## 2019-09-23 MED ORDER — ACETAMINOPHEN 325 MG PO TABS: 650.0000 mg | ORAL_TABLET | ORAL | Status: DC | PRN

## 2019-09-23 MED ORDER — SODIUM CHLORIDE 0.9 % IV SOLN: 250.0000 mL | INTRAVENOUS | Status: DC | PRN

## 2019-09-23 MED ORDER — ALLOPURINOL 300 MG PO TABS
300.0000 mg | ORAL_TABLET | Freq: Every day | ORAL | Status: DC
  Administered 2019-09-24 – 2019-09-27 (×4): 300 mg via ORAL
  Filled 2019-09-23 (×4): qty 1

## 2019-09-23 MED ORDER — RAMIPRIL 2.5 MG PO CAPS
5.0000 mg | ORAL_CAPSULE | Freq: Every day | ORAL | Status: DC
  Filled 2019-09-23 (×2): qty 1

## 2019-09-23 MED ORDER — FUROSEMIDE 10 MG/ML IJ SOLN
40.0000 mg | Freq: Two times a day (BID) | INTRAMUSCULAR | Status: DC
  Administered 2019-09-24 – 2019-09-25 (×3): 40 mg via INTRAVENOUS
  Filled 2019-09-23 (×3): qty 4

## 2019-09-23 MED ORDER — ROSUVASTATIN CALCIUM 5 MG PO TABS
10.0000 mg | ORAL_TABLET | Freq: Every evening | ORAL | Status: DC
  Administered 2019-09-24 – 2019-09-27 (×4): 10 mg via ORAL
  Filled 2019-09-23 (×4): qty 2

## 2019-09-23 MED ORDER — SODIUM CHLORIDE 0.9% FLUSH
3.0000 mL | Freq: Two times a day (BID) | INTRAVENOUS | Status: DC
  Administered 2019-09-23 – 2019-09-27 (×7): 3 mL via INTRAVENOUS

## 2019-09-23 MED ORDER — FUROSEMIDE 10 MG/ML IJ SOLN
40.0000 mg | Freq: Once | INTRAMUSCULAR | Status: AC
  Administered 2019-09-23: 18:00:00 40 mg via INTRAVENOUS
  Filled 2019-09-23: qty 4

## 2019-09-23 MED ORDER — TEMAZEPAM 15 MG PO CAPS: 30.0000 mg | ORAL_CAPSULE | Freq: Every evening | ORAL | Status: DC | PRN

## 2019-09-23 MED ORDER — SODIUM CHLORIDE 0.9% FLUSH
3.0000 mL | INTRAVENOUS | Status: DC | PRN
  Administered 2019-09-24: 17:00:00 3 mL via INTRAVENOUS
  Filled 2019-09-23: qty 3

## 2019-09-23 MED ORDER — ASPIRIN EC 81 MG PO TBEC
81.0000 mg | DELAYED_RELEASE_TABLET | Freq: Every day | ORAL | Status: DC
  Administered 2019-09-24 – 2019-09-26 (×3): 81 mg via ORAL
  Filled 2019-09-23 (×3): qty 1

## 2019-09-23 MED ORDER — METOPROLOL TARTRATE 25 MG PO TABS
25.0000 mg | ORAL_TABLET | Freq: Two times a day (BID) | ORAL | Status: DC
  Administered 2019-09-23 – 2019-09-27 (×8): 25 mg via ORAL
  Filled 2019-09-23 (×7): qty 1

## 2019-09-23 MED ORDER — VITAMIN D 25 MCG (1000 UNIT) PO TABS
1000.0000 [IU] | ORAL_TABLET | Freq: Every day | ORAL | Status: DC
  Administered 2019-09-23 – 2019-09-26 (×4): 1000 [IU] via ORAL
  Filled 2019-09-23 (×4): qty 1

## 2019-09-23 MED ORDER — ENOXAPARIN SODIUM 40 MG/0.4ML ~~LOC~~ SOLN
40.0000 mg | SUBCUTANEOUS | Status: DC
  Administered 2019-09-24 – 2019-09-25 (×2): 40 mg via SUBCUTANEOUS
  Filled 2019-09-23 (×3): qty 0.4

## 2019-09-23 MED ORDER — ASPIRIN 81 MG PO CHEW
324.0000 mg | CHEWABLE_TABLET | Freq: Once | ORAL | Status: AC
  Administered 2019-09-23: 18:00:00 324 mg via ORAL
  Filled 2019-09-23: qty 4

## 2019-09-23 MED ORDER — FAMOTIDINE 20 MG PO TABS
20.0000 mg | ORAL_TABLET | Freq: Every day | ORAL | Status: DC
  Administered 2019-09-24 – 2019-09-27 (×4): 20 mg via ORAL
  Filled 2019-09-23 (×4): qty 1

## 2019-09-23 MED ORDER — AMLODIPINE BESYLATE 10 MG PO TABS
10.0000 mg | ORAL_TABLET | Freq: Every day | ORAL | Status: DC
  Administered 2019-09-24 – 2019-09-27 (×4): 10 mg via ORAL
  Filled 2019-09-23 (×4): qty 1

## 2019-09-23 MED ORDER — TAMSULOSIN HCL 0.4 MG PO CAPS
0.4000 mg | ORAL_CAPSULE | Freq: Every evening | ORAL | Status: DC
  Administered 2019-09-24 – 2019-09-27 (×4): 0.4 mg via ORAL
  Filled 2019-09-23 (×4): qty 1

## 2019-09-23 NOTE — Consult Note (Addendum)
Cardiology Consultation:   Patient ID: Andre Miranda MRN: [id number]; DOB: 02/04/1936  Admit date: 09/23/2019 Date of Consult: 09/23/2019  Primary Care Provider: Crist Infante, MD Primary Cardiologist: Sinclair Grooms, MD  Patient Profile:   Andre Miranda is a 83 y.o. male with a hx of CAD with prior [facility] in 2003, [facility] s/p R [facility], OSA, hyperlipidemia,difficult to controlHTN, and chronic diastolic HF who is being seen today for the evaluation of CHF and elevated troponin  at the request of Dr. Regenia Skeeter.   He last saw Dr. Tamala Julian back in July - had had recent admission for acute diastolic HF. Endorsed mild DOE first thing most mornings and mild chest tightness if walks too long/too hard. He was felt to still have volume overload on exam - diuretics were increased - possible ischemic evaluation ifsymptomsdid not improve.  Admitted with a "near" syncopal spell on 09/10/2019. It was suspected to be due to hypotension.  Seen by Truitt Merle on 09/19/2019 for 2 syncope episode x 2 weeks. Bifascicular block with 1st degree AV block on EKG. Also had chest tightness with exertion. Continued low dose BB. Applied Zio patch for 14 days then event monitor for 30 days. Held diuretics.   Due to ongoing symptoms (chest tightness) he is set up for cath on 09/27/2019 with Dr. Tamala Julian.   History of Present Illness:   Mr. Bok presented for evaluation of worsening shortness of breath.  He reports shortness of breath for the past 3 days which worsens as days goes by.  Yesterday he had a worse day.  He cannot lay flat last night and needed to sleep in recliner.  He has stable chest tightness with ambulation which resolved with rest.  No syncope episode on Zio patch.  Compliant with his medication.  Due to worsening symptoms he called EMS and brought to ER for further evaluation.  BNP 581. Hs-troponin 150 (higher from last admission). Scr 1.09. L 4.5. Hgb 12.9.  Positive stool guaiac. CXR showed new small pleural effusions, potentially multifocal infection and/or edema.  He received IV Lasix 40 mg x 1 in ER. Blood pressure stable.   Heart Pathway Score:     Past Medical History:  Diagnosis Date  . Aortic stenosis    MODERATE  . Cancer [facility])    prostate cancer-radiation  . Carotid arterial disease ([place])   . Coronary artery disease   . Dysuria   . GERD (gastroesophageal reflux disease)   . Gout   . Hematuria   . History of urinary retention   . Hyperlipidemia   . Hypertension   . Obesity   . Sleep apnea    uses CPAP nightly    Past Surgical History:  Procedure Laterality Date  . AORTIC VALVE REPAIR  February 06, 2015   Dr. Aline Brochure and Dr. Ysidro Evert  at [facility]  . [place]  02/2008  . CHOLECYSTECTOMY  04/2002  . CORONARY ARTERY BYPASS GRAFT  2003   LIMA TO THE LAD, RIMA TO THE RCA, AND A SAPHENOUS VEIN GRAFT TO THE INTERMEDIATE AND DISTAL LEFT CIRCUMFLEX  . ORIF ANKLE FRACTURE Right 02/14/2019   Procedure: OPEN REDUCTION INTERNAL FIXATION (ORIF) ANKLE FRACTURE;  Surgeon: Netta Cedars, MD;  Location: [place];  Service: Orthopedics;  Laterality: Right;  . US ECHOCARDIOGRAPHY  08/2009   SHOWED A MEAN GRADIENT ACROSS IS AORTIC VALVE OF 24 MM OF MERCURY. HE HAD MODERATE LVH AND NORMAL LV FUNCTION  Inpatient Medications: Scheduled Meds: . aspirin  324 mg Oral Once  . furosemide  40 mg Intravenous Once   Continuous Infusions:  PRN Meds:   Allergies:    Allergies  Allergen Reactions  . Ibuprofen Hypertension    Social History:   Social History   Socioeconomic History  . Marital status: Married    Spouse name: Not on file  . Number of children: Not on file  . Years of education: Not on file  . Highest education level: Not on file  Occupational History  . Not on file  Social Needs  . Financial resource strain: Not on file  . Food insecurity    Worry: Not on file    Inability: Not on  file  . Transportation needs    Medical: Not on file    Non-medical: Not on file  Tobacco Use  . Smoking status: Former Smoker    Packs/day: 1.50    Years: 7.00    Pack years: 10.50    Types: Cigarettes    Quit date: 04/03/1971    Years since quitting: 48.5  . Smokeless tobacco: Never Used  Substance and Sexual Activity  . Alcohol use: Yes    Comment: social  . Drug use: No  . Sexual activity: Not on file  Lifestyle  . Physical activity    Days per week: Not on file    Minutes per session: Not on file  . Stress: Not on file  Relationships  . Social Herbalist on phone: Not on file    Gets together: Not on file    Attends religious service: Not on file    Active member of club or organization: Not on file    Attends meetings of clubs or organizations: Not on file    Relationship status: Not on file  . Intimate partner violence    Fear of current or ex partner: Not on file    Emotionally abused: Not on file    Physically abused: Not on file    Forced sexual activity: Not on file  Other Topics Concern  . Not on file  Social History Narrative  . Not on file    Family History:   Family History  Problem Relation Age of Onset  . Heart attack Father   . Pneumonia Mother 3     ROS:  Please see the history of present illness.  All other ROS reviewed and negative.     Physical Exam/Data:   Vitals:   09/23/19 1545 09/23/19 1600 09/23/19 1615 09/23/19 1630  BP: (!) 143/45 (!) 138/51 (!) 138/50 (!) 139/48  Pulse: 70 65 64 69  Resp: (!) 34 18 18 18   Temp:      TempSrc:      SpO2: 96% 97% 96% 97%  Weight:      Height:       No intake or output data in the 24 hours ending 09/23/19 1725 Last 3 Weights 09/23/2019 09/19/2019 09/12/2019  Weight (lbs) 232 lb 238 lb 1.9 oz 228 lb 9.6 oz  Weight (kg) 105.235 kg 108.011 kg 103.692 kg     Body mass index is 33.29 kg/m.  General:  Well nourished, well developed, in no acute distress HEENT: normal Lymph: no  adenopathy Neck: + JVD Endocrine:  No thryomegaly Vascular: No carotid bruits; FA pulses 2+ bilaterally without bruits  Cardiac:  normal S1, S2; RRR; no murmur  Lungs:  clear to auscultation bilaterally, no wheezing, rhonchi or  rales  Abd: soft, nontender, no hepatomegaly  Ext: Trace  edema Musculoskeletal:  No deformities, BUE and BLE strength normal and equal Skin: warm and dry  Neuro:  CNs 2-12 intact, no focal abnormalities noted Psych:  Normal affect   EKG:  The EKG was personally reviewed and demonstrates: SR at rate of 66 bpm and RBBB  Relevant CV Studies:  Echo 09/11/2019  1. Left ventricular ejection fraction, by visual estimation, is 60 to 65%. The left ventricle has normal function. Moderately increased left ventricular size. There is no left ventricular hypertrophy.  2. Left ventricular diastolic Doppler parameters are indeterminate pattern of LV diastolic filling.  3. Global right ventricle has normal systolic function.The right ventricular size is normal. No increase in right ventricular wall thickness.  4. Left atrial size was normal.  5. Right atrial size was normal.  6. Moderate mitral annular calcification. Moderate thickening of the mitral valve leaflet(s). No evidence of mitral valve regurgitation. Mild mitral stenosis.  7. The tricuspid valve is normal in structure. Tricuspid valve regurgitation is trivial.  8. S/p TAVR with 69mm Direct Flow Medical valve valve is present in the aortic position. Procedure Date: 2016. Aortic valve area, by VTI measures 1.69 cm. Aortic valve mean gradient measures 17.2 mmHg. Aortic valve peak gradient measures 27.7 mmHg.  There is moderate perivalvular Aortic valve regurgitation by color flow Doppler.  9. The pulmonic valve was normal in structure. Pulmonic valve regurgitation is trivial by color flow Doppler. 10. The inferior vena cava is normal in size with greater than 50% respiratory variability, suggesting right atrial pressure of  3 mmHg. 11. .  Laboratory Data:  High Sensitivity Troponin:   Recent Labs  Lab 09/10/19 1953 09/10/19 2201 09/10/19 2350 09/11/19 0132 09/23/19 1600  TROPONINIHS 55* 64* 58* 59* 150*     Chemistry Recent Labs  Lab 09/19/19 1222 09/23/19 1600  NA 137 134*  K 4.8 4.5  CL 104 102  CO2 21 21*  GLUCOSE 88 112*  BUN 25 20  CREATININE 1.27 1.09  CALCIUM 9.4 9.3  GFRNONAA 53* >60  GFRAA 61 >60  ANIONGAP  --  11    Hematology Recent Labs  Lab 09/23/19 1600  WBC 8.9  RBC 4.19*  HGB 12.9*  HCT 40.1  MCV 95.7  MCH 30.8  MCHC 32.2  RDW 14.6  PLT 117*   BNP Recent Labs  Lab 09/23/19 1600  BNP 581.7*     Radiology/Studies:  Dg Chest Portable 1 View  Result Date: 09/23/2019 CLINICAL DATA:  83 year old male with a history of dyspnea and leg swelling EXAM: PORTABLE CHEST 1 VIEW COMPARISON:  09/10/2019 FINDINGS: Cardiomediastinal silhouette unchanged in size and contour. Surgical changes of median sternotomy. Cardiac event recorder projects over the left chest. Mixed reticulonodular opacities of the bilateral lung bases with obscuration of the hemidiaphragm bilaterally. No pneumothorax.  No significant interlobular septal thickening. IMPRESSION: Persisting reticulonodular opacities at the lung bases with new small pleural effusions, potentially multifocal infection and/or edema. Electronically Signed   By: Corrie Mckusick D.O.   On: 09/23/2019 16:03    Assessment and Plan:   1. Acute on chronic diastolic heart failure Worsening shortness of breath for past 3 days and unable to lay flat last night.  Evidence of mild volume overload by exam with BNP of 581 mg. Given IV Lasix x1 in ER.  Reevaluate for further diuresis in the morning.  He was not on any diuretics at home. - Last echo 09/11/2019 as above  with preserved LVEF  2.  Exertional chest tightness with history of CAD -He has stable angina and scheduled for cardiac cath on Tuesday -High-sensitivity troponin 150 (higher  from baseline) -Cycle troponin, if significant elevation consider heparin -Schedule cath on Monday after diuresis over weekend -Continue aspirin, statin and beta-blocker  3.  Recurrent syncope -No syncope while on Zio patch -Monitor on telemetry  4.  GI bleed -Patient reported clot in his stool.  He has history of hemorrhoid previously.  Guaiac positive.  Hemoglobin stable.  Seems likely due to bleeding hemorrhoid.  Per primary team.  5. S/p TVAR - Last echo 09/11/2019: Aortic valve mean gradient measures 17.2 mmHg. Aortic valve peak gradient measures 27.7 mmHg.   6. HLD - 04/23/2019: Cholesterol 90; HDL 32; LDL Cholesterol 42; Triglycerides 79; VLDL 16  - Continue crestor  7. HTN - Follow on home meds.   Dr. Radford Pax to see   For questions or updates, please contact [facility] Please consult [email or web address] for contact info under     Jarrett Soho, PA  09/23/2019 5:25 PM   Patient seen and independently examined with Leanor Kail, PA. We discussed all aspects of the encounter. I agree with the assessment and plan as stated above.  Patient is followed by Dr. Daneen Schick and has a hx of chronic diastolic CHF, CAD with prior chronic stable angina, AS s/p recent TAVR, HTN and HLD.  He has been having problems recently with volume overload and was recently hospitalized for CHF and near syncope felt related to hypotension.  He was seen back in the office 11/2 and was complaining of CP with exertion worse than his stable anginal type pain.  Due to syncope he was set up with a Zio Patch and diuretics were held due to syncope and soft BP.  He also was set up for cath on Tuesday 11/10 due to worsening angina.    Unfortunately over the past 3 days he has had problems with DOE to the point he has not been able to lay flat overnight and had to try to sleep in a recliner.  He became concerned and was transferred to ER. He admitted to recently having bloody stools and his wife  says that it was enough to fill the toilet bowel with blood.  Hbg on admit 12.9 and stool guiac +.    GEN: Well nourished, well developed in no acute distress HEENT: Normal NECK: No JVD; No carotid bruits LYMPHATICS: No lymphadenopathy CARDIAC:RRR, no murmurs, rubs, gallops RESPIRATORY:  Clear to auscultation without rales, wheezing or rhonchi  ABDOMEN: Soft, non-tender, non-distended MUSCULOSKELETAL:  1+ bilateral LE edema; No deformity  SKIN: Warm and dry NEUROLOGIC:  Alert and oriented x 3 PSYCHIATRIC:  Normal affect   EKG shows NSR with RBBB.  BNP elevated at 581 and creatinine 1.09.  Cxray with edema vx. Multifocal infection.  Recent echo showed normal LVF and stable TAVR with moderate perivalvular AR.  He has evidence of volume overload with increased LE edema, xray with edema SOB.  Bp controlled.  Will tart Lasix 40mg  IV BID and follow I&O's and renal function.  Trop mildly elevated at 150>133 but could be demand ischemia in the setting of CHF.  Will hold off on IV Heparin at this time due to heme + stools and recent rectal bleeding.  Needs GI evaluation over weekend.  Continue to cycle trop.  Plan for left heart cath on Monday.  Continue ASA, statin and BB.  Need to follow  TAVR due to moderate perivalvular leak on last echo.    Signed: Fransico Him, MD [facility] 09/23/2019  7:55PM   Attending note  Patient seen independently and documentation reviewed, I agree with PA Bhavinkumar's documentation. 83 yo male history of CAD with prior CABG in 2003, aortic stenosis with prior TAVR at [facility] in 2016, right CEA, OSA, HTN, chronic diastoilc HF admitted with CHF and elevated troponin  Has been followed by Dr Tamala Julian as outpatient, plan has been for cath 09/27/19 due to ongoing symptoms of SOB and chest pain.   K 4.5 Cr 1.09 BUN 20 BNP 581 WBC 8.9 Plt 117 FOBT + hstrop 150-->133--> CXR persisting reticulonodular opacities lung bases, potential multifocal infection or edema 08/2019 echo  LVEF 60-65%, no WMA. AVR moder perivalvular leak  Negative 1.8 L yesterday, received lasix 40mg  IV x 1. Scheduled for 40mg  iV bid today, renal function is stable. Continue diureisis, plan for cath on Monday. Heme + stool per primary team, follow blood counts particularly prior to cath Monday.   Carlyle Dolly MD

## 2019-09-23 NOTE — Telephone Encounter (Signed)
  Wife is calling because the patient had rectal bleeding last night and was having some shortness of breath on exertion. She states it seems to get worse in the evenings and he is unable to lay flat at night. She says the shortness of breath has been going on for a while but the rectal bleeding is something new. She would like to speak to the nurse.

## 2019-09-23 NOTE — Telephone Encounter (Signed)
New Message:      Please call, wife said Andre Miranda was just taken by EMS to [facility] ER. She have some questions. Andre Miranda is scheduled for Cath on 09-27-19 and for COVID Test tomorrow at [place] wants to know if Andre Miranda have  COVID test today at [facility], if discharged will he still need COVID test tomorrow?

## 2019-09-23 NOTE — ED Notes (Signed)
Lab called - troponin- 513

## 2019-09-23 NOTE — Telephone Encounter (Signed)
Spoke with Dr. Tamala Julian and he said pt will likely be admitted and plan for cath Monday.  Called and left wife detailed message with this information.  Advised if for some reason he does get released today, to continue with plan for COVID screen tomorrow and cath Tuesday.  Advised to call back with any questions.

## 2019-09-23 NOTE — ED Provider Notes (Signed)
[facility] EMERGENCY DEPARTMENT Provider Note   CSN: [id number] Arrival date & time: 09/23/19  1436     History   Chief Complaint Chief Complaint  Patient presents with  . Near Syncope  . Shortness of Breath    HPI Andre Miranda is a 83 y.o. male.     HPI  83 year old male presents with a chief complaint of shortness of breath.  Has been steadily worsening for several days.  The worst of it was last night.  He notices it mostly with exertion, and sometimes at night.  Last night he had a rough night and had to sleep in a recliner.  Feels similar to prior fluid overload.  He did have some lightheadedness today but this is not as bad as it has been in the past.  Some chest tightness on and off but he states is not that bad.  He has noticed some ankle swelling.  He was taken off of his diuretic about 10 days ago because of concern for dehydration.  No cough or fever.  When wife arrives, she notes that the patient has also had dark blood with blood clots from his rectum.  This is happened twice since yesterday.  No abdominal pain.  Past Medical History:  Diagnosis Date  . Aortic stenosis    MODERATE  . Cancer [facility])    prostate cancer-radiation  . Carotid arterial disease ([place])   . Coronary artery disease   . Dysuria   . GERD (gastroesophageal reflux disease)   . Gout   . Hematuria   . History of urinary retention   . Hyperlipidemia   . Hypertension   . Obesity   . Sleep apnea    uses CPAP nightly    Patient Active Problem List   Diagnosis Date Noted  . Near syncope 09/11/2019  . Pre-syncope 09/10/2019  . Congestive heart failure ([place])   . Hx of CABG   . History of right-sided carotid endarterectomy   . Personal history of prostate cancer   . AKI (acute kidney injury) ([place])   . Gastroesophageal reflux disease   . Insomnia   . Acute on chronic diastolic CHF (congestive heart failure) ([place]) 04/22/2019  . 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 Accelerated hypertension 07/27/2017  . Right-sided chest wall pain 09/28/2014  . Occlusion and stenosis of carotid artery without mention of cerebral infarction 03/28/2014  . Acute bronchitis with asthma 08/25/2012  . Multiple lung nodules 06/20/2012  . Hyperlipidemia 10/02/2011  . CAD (coronary artery disease) 04/04/2011  . Hypertension   . Obesity   . Dysuria   . Hematuria   . Aortic stenosis   . Obstructive sleep apnea   . Gout   . Carotid arterial disease ([place])     Past Surgical History:  Procedure Laterality Date  . AORTIC VALVE REPAIR  February 06, 2015   Dr. Aline Brochure and Dr. Ysidro Evert  at [facility]  . [place]  02/2008  . CHOLECYSTECTOMY  04/2002  . CORONARY ARTERY BYPASS GRAFT  2003   LIMA TO THE LAD, RIMA TO THE RCA, AND A SAPHENOUS VEIN GRAFT TO THE INTERMEDIATE AND DISTAL LEFT CIRCUMFLEX  . ORIF ANKLE FRACTURE Right 02/14/2019   Procedure: OPEN REDUCTION INTERNAL FIXATION (ORIF) ANKLE FRACTURE;  Surgeon: Netta Cedars, MD;  Location: [place];  Service: Orthopedics;  Laterality: Right;  . US ECHOCARDIOGRAPHY  08/2009   SHOWED A MEAN GRADIENT ACROSS IS AORTIC VALVE OF  24 MM OF MERCURY. HE HAD MODERATE LVH AND NORMAL LV FUNCTION        Home Medications    Prior to Admission medications   Medication Sig Start Date End Date Taking? Authorizing Provider  allopurinol (ZYLOPRIM) 300 MG tablet Take 300 mg by mouth daily.      [provider]  amLODipine (NORVASC) 10 MG tablet TAKE 1 TABLET BY MOUTH ONCE DAILY 12/29/18   Belva Crome, MD  aspirin EC 81 MG tablet Take 81 mg by mouth at bedtime.  11/23/12   Larey Dresser, MD  Cholecalciferol (VITAMIN D3) 1000 UNITS CAPS Take 1 capsule by mouth at bedtime.     [provider]  famotidine (PEPCID) 20 MG tablet Take 20 mg by mouth daily.      [provider]  levothyroxine (SYNTHROID) 25 MCG tablet Take 1 tablet (25 mcg total) by mouth daily  before breakfast. 09/20/19   Burtis Junes, NP  metoprolol tartrate (LOPRESSOR) 25 MG tablet Take 1 tablet (25 mg total) by mouth 2 (two) times daily. 09/06/19 12/05/19  Burtis Junes, NP  Multiple Vitamins-Minerals (CENTRUM SILVER) tablet Take 1 tablet by mouth daily.      [provider]  ramipril (ALTACE) 5 MG capsule Take 1 capsule (5 mg total) by mouth daily. 09/06/19 12/05/19  Burtis Junes, NP  rosuvastatin (CRESTOR) 20 MG tablet Take 10 mg by mouth every evening.    [provider]  Tamsulosin HCl (FLOMAX) 0.4 MG CAPS Take 0.4 mg by mouth every evening.     [provider]  temazepam (RESTORIL) 30 MG capsule Take 30 mg by mouth at bedtime as needed for sleep.    [provider]    Family History Family History  Problem Relation Age of Onset  . Heart attack Father   . Pneumonia Mother 30    Social History Social History   Tobacco Use  . Smoking status: Former Smoker    Packs/day: 1.50    Years: 7.00    Pack years: 10.50    Types: Cigarettes    Quit date: 04/03/1971    Years since quitting: 48.5  . Smokeless tobacco: Never Used  Substance Use Topics  . Alcohol use: Yes    Comment: social  . Drug use: No     Allergies   Ibuprofen   Review of Systems Review of Systems  Constitutional: Negative for fever and unexpected weight change.  Respiratory: Positive for chest tightness and shortness of breath. Negative for cough.   Cardiovascular: Positive for leg swelling. Negative for chest pain.  Gastrointestinal: Negative for abdominal pain.  All other systems reviewed and are negative.    Physical Exam Updated Vital Signs BP (!) 122/40   Pulse 65   Temp 98 F (36.7 C) (Oral)   Resp 17   Ht 5\' 10"  (1.778 m)   Wt 105.2 kg   SpO2 95%   BMI 33.29 kg/m   Physical Exam Vitals signs and nursing note reviewed.  Constitutional:      General: He is not in acute distress.    Appearance: He is well-developed. He is not  ill-appearing or diaphoretic.  HENT:     Head: Normocephalic and atraumatic.     Right Ear: External ear normal.     Left Ear: External ear normal.     Nose: Nose normal.  Eyes:     General:        Right eye: No discharge.  Left eye: No discharge.  Neck:     Musculoskeletal: Neck supple.  Cardiovascular:     Rate and Rhythm: Normal rate and regular rhythm.     Heart sounds: Murmur present.  Pulmonary:     Effort: Pulmonary effort is normal.     Breath sounds: Examination of the right-lower field reveals decreased breath sounds and rales. Examination of the left-lower field reveals decreased breath sounds and rales. Decreased breath sounds and rales present.  Abdominal:     Palpations: Abdomen is soft.     Tenderness: There is no abdominal tenderness.  Genitourinary:    Comments: Small non thrombosed hemorrhoid (external). Dark red blood on digital exam. No pain or masses Musculoskeletal:     Right lower leg: Edema present.     Left lower leg: Edema present.     Comments: Mild pitting edema to bilateral ankles/feet  Skin:    General: Skin is warm and dry.  Neurological:     Mental Status: He is alert.  Psychiatric:        Mood and Affect: Mood is not anxious.      ED Treatments / Results  Labs (all labs ordered are listed, but only abnormal results are displayed) Labs Reviewed  BASIC METABOLIC PANEL - Abnormal; Notable for the following components:      Result Value   Sodium 134 (*)    CO2 21 (*)    Glucose, Bld 112 (*)    All other components within normal limits  BRAIN NATRIURETIC PEPTIDE - Abnormal; Notable for the following components:   B Natriuretic Peptide 581.7 (*)    All other components within normal limits  CBC WITH DIFFERENTIAL/PLATELET - Abnormal; Notable for the following components:   RBC 4.19 (*)    Hemoglobin 12.9 (*)    Platelets 117 (*)    All other components within normal limits  POC OCCULT BLOOD, ED - Abnormal; Notable for the  following components:   Fecal Occult Bld POSITIVE (*)    All other components within normal limits  TROPONIN I (HIGH SENSITIVITY) - Abnormal; Notable for the following components:   Troponin I (High Sensitivity) 150 (*)    All other components within normal limits  SARS CORONAVIRUS 2 (TAT 6-24 HRS)  TROPONIN I (HIGH SENSITIVITY)    EKG EKG Interpretation  Date/Time:  Friday September 23 2019 15:50:59 EST Ventricular Rate:  66 PR Interval:    QRS Duration: 151 QT Interval:  465 QTC Calculation: 488 R Axis:   54 Text Interpretation: Sinus rhythm Probable left atrial enlargement Right bundle branch block no significant change since Oct 2020 Confirmed by Sherwood Gambler [phone]) on 09/23/2019 3:55:27 PM   Radiology Dg Chest Portable 1 View  Result Date: 09/23/2019 CLINICAL DATA:  83 year old male with a history of dyspnea and leg swelling EXAM: PORTABLE CHEST 1 VIEW COMPARISON:  09/10/2019 FINDINGS: Cardiomediastinal silhouette unchanged in size and contour. Surgical changes of median sternotomy. Cardiac event recorder projects over the left chest. Mixed reticulonodular opacities of the bilateral lung bases with obscuration of the hemidiaphragm bilaterally. No pneumothorax.  No significant interlobular septal thickening. IMPRESSION: Persisting reticulonodular opacities at the lung bases with new small pleural effusions, potentially multifocal infection and/or edema. Electronically Signed   By: Corrie Mckusick D.O.   On: 09/23/2019 16:03    Procedures Procedures (including critical care time)  Medications Ordered in ED Medications  furosemide (LASIX) injection 40 mg (40 mg Intravenous Given 09/23/19 1758)  aspirin chewable tablet 324 mg (324  mg Oral Given 09/23/19 1758)     Initial Impression / Assessment and Plan / ED Course  I have reviewed the triage vital signs and the nursing notes.  Pertinent labs & imaging results that were available during my care of the patient were reviewed by  me and considered in my medical decision making (see chart for details).        Patient appears to have acute CHF exacerbation.  Will give IV diuresis.  He also complains of some rectal bleeding and does appear to have some dark red blood on digital exam.  Could be diverticulosis given no pain.  Has elevated troponin which is higher than last time though MI seems less likely and this is probably CHF related.  Cardiology was consulted and will see.  Hospitalist to admit.  DEAUNTA TERRASI was evaluated in Emergency Department on 09/23/2019 for the symptoms described in the history of present illness. He was evaluated in the context of the global COVID-19 pandemic, which necessitated consideration that the patient might be at risk for infection with the SARS-CoV-2 virus that causes COVID-19. Institutional protocols and algorithms that pertain to the evaluation of patients at risk for COVID-19 are in a state of rapid change based on information released by regulatory bodies including the CDC and federal and state organizations. These policies and algorithms were followed during the patient's care in the ED.   Final Clinical Impressions(s) / ED Diagnoses   Final diagnoses:  Acute on chronic congestive heart failure, unspecified heart failure type ([place])  Elevated troponin    ED Discharge Orders    None       Sherwood Gambler, MD 09/23/19 2345

## 2019-09-23 NOTE — Telephone Encounter (Signed)
Returned call to Pt's wife.  Spoke with wife for at least 15 minutes.  We discussed the heart monitor and what that was looking for (Pt has been hitting the mark button for chest pain).  Discussed with wife electrical vs. structural issues.  Per wife Pt's chest pain and sob seems to be increasing every day.  Pt is scheduled for heart cath on 11/10.  Advised wife to continue to monitor Pt and if they can safely make it to 11/10 that is great.  Advised wife if chest pain and sob continue to get worse she should not hesitate to take Pt to ER.   Also advised that she could call our office 24 hours a day if she needed any help deciding if she should call 911.  Wife is retired [employer] scientist.  Very thankful for the discussion.  She will contact us if she needs any further guidance.  No further rectal bleeding-bleeding happened after bowel movement.

## 2019-09-23 NOTE — ED Triage Notes (Addendum)
Pt here from home via [facility].   Hx of syncopal episodes and sob.  Sob became worse last night and today he felt dizzy, like he was going to feint, but he was able to sit down and avoid loc.  VS stable.  Lung sounds clear.  97% RA, rr 20, 140/50, hr 70, cbg 123, temp 97.7.  Yesterday pt noted bright red blood in toilet.  No hx of.  EKG was RBB, hx of same.  18g L ac.

## 2019-09-23 NOTE — H&P (Signed)
History and Physical   Andre Miranda [id number] DOB: [birth year] DOA: 09/23/2019  Referring MD/NP/PA: Dr. Verta Ellen  PCP: Crist Infante, MD   Outpatient Specialists: None  Patient coming from: Home  Chief Complaint: Shortness of breath and presyncope  HPI: Andre Miranda is a 83 y.o. male with medical history significant of aortic valve disease status post TAVR, coronary artery disease, hypertension, diastolic dysfunction, GERD, morbid obesity and hyperlipidemia who was previously on diuretics but was discontinued due to relative hypotension.  Patient has been having progressive shortness of breath and worsening lower extremity edema.  He has noted some PND and orthopnea.  Today he felt lightheaded.  He felt like he was heavy on his legs and chest.  The last time he took a diuretic was 10 days ago.  Patient also has known history of diverticulosis and noticed some slightly bloody stools today.  Vitals and hemoglobin remained stable hemodynamically in general stable.  Patient is being admitted with fluid overload indicated for acute CHF exacerbation..  ED Course: Temperature is 98 blood pressure 157/56 pulse 87 respirate of 34 oxygen sats 93% on room air.  Sodium 134 potassium 4.5 chloride 102 CO2 21 and glucose 112.  Creatinine 1.09.  BNP is 581.7.  Troponin I 50 and then 133.  Hemoglobin 12.9 otherwise CBC within normal.  Fecal occult blood testing is positive x2.  Chest x-ray showed persistent reticular nodular opacity at the lung bases with new small pleural effusions.  Multifocal infection or edema suspected.  COVID-19 screen is pending.  Review of Systems: As per HPI otherwise 10 point review of systems negative.    Past Medical History:  Diagnosis Date  . Aortic stenosis    MODERATE  . Cancer [facility])    prostate cancer-radiation  . Carotid arterial disease ([place])   . Coronary artery disease   . Dysuria   . GERD (gastroesophageal reflux disease)   . Gout   . Hematuria   .  History of urinary retention   . Hyperlipidemia   . Hypertension   . Obesity   . Sleep apnea    uses CPAP nightly    Past Surgical History:  Procedure Laterality Date  . AORTIC VALVE REPAIR  February 06, 2015   Dr. Aline Brochure and Dr. Ysidro Evert  at [facility]  . [place]  02/2008  . CHOLECYSTECTOMY  04/2002  . CORONARY ARTERY BYPASS GRAFT  2003   LIMA TO THE LAD, RIMA TO THE RCA, AND A SAPHENOUS VEIN GRAFT TO THE INTERMEDIATE AND DISTAL LEFT CIRCUMFLEX  . ORIF ANKLE FRACTURE Right 02/14/2019   Procedure: OPEN REDUCTION INTERNAL FIXATION (ORIF) ANKLE FRACTURE;  Surgeon: Netta Cedars, MD;  Location: [place];  Service: Orthopedics;  Laterality: Right;  . US ECHOCARDIOGRAPHY  08/2009   SHOWED A MEAN GRADIENT ACROSS IS AORTIC VALVE OF 24 MM OF MERCURY. HE HAD MODERATE LVH AND NORMAL LV FUNCTION     reports that he quit smoking about 48 years ago. His smoking use included cigarettes. He has a 10.50 pack-year smoking history. He has never used smokeless tobacco. He reports current alcohol use. He reports that he does not use drugs.  Allergies  Allergen Reactions  . Ibuprofen Hypertension    Family History  Problem Relation Age of Onset  . Heart attack Father   . Pneumonia Mother 42     Prior to Admission medications   Medication Sig Start Date End Date Taking? Authorizing Provider  allopurinol (ZYLOPRIM) 300 MG tablet Take  300 mg by mouth daily.      [provider]  amLODipine (NORVASC) 10 MG tablet TAKE 1 TABLET BY MOUTH ONCE DAILY 12/29/18   Belva Crome, MD  aspirin EC 81 MG tablet Take 81 mg by mouth at bedtime.  11/23/12   Larey Dresser, MD  Cholecalciferol (VITAMIN D3) 1000 UNITS CAPS Take 1 capsule by mouth at bedtime.     [provider]  famotidine (PEPCID) 20 MG tablet Take 20 mg by mouth daily.      [provider]  levothyroxine (SYNTHROID) 25 MCG tablet Take 1 tablet (25 mcg total) by mouth daily before breakfast.  09/20/19   Burtis Junes, NP  metoprolol tartrate (LOPRESSOR) 25 MG tablet Take 1 tablet (25 mg total) by mouth 2 (two) times daily. 09/06/19 12/05/19  Burtis Junes, NP  Multiple Vitamins-Minerals (CENTRUM SILVER) tablet Take 1 tablet by mouth daily.      [provider]  ramipril (ALTACE) 5 MG capsule Take 1 capsule (5 mg total) by mouth daily. 09/06/19 12/05/19  Burtis Junes, NP  rosuvastatin (CRESTOR) 20 MG tablet Take 10 mg by mouth every evening.    [provider]  Tamsulosin HCl (FLOMAX) 0.4 MG CAPS Take 0.4 mg by mouth every evening.     [provider]  temazepam (RESTORIL) 30 MG capsule Take 30 mg by mouth at bedtime as needed for sleep.    [provider]    Physical Exam: Vitals:   09/23/19 2023 09/23/19 2024 09/23/19 2055 09/23/19 2100  BP: (!) 151/62  (!) 154/47   Pulse:  86 73   Resp:  16 19 13   Temp:   97.7 F (36.5 C)   TempSrc:   Oral   SpO2:  95% 98%   Weight:    105.7 kg  Height:    5\' 10"  (1.778 m)      Constitutional: NAD, anxious comfortable Vitals:   09/23/19 2023 09/23/19 2024 09/23/19 2055 09/23/19 2100  BP: (!) 151/62  (!) 154/47   Pulse:  86 73   Resp:  16 19 13   Temp:   97.7 F (36.5 C)   TempSrc:   Oral   SpO2:  95% 98%   Weight:    105.7 kg  Height:    5\' 10"  (1.778 m)   Eyes: PERRL, lids and conjunctivae normal ENMT: Mucous membranes are moist. Posterior pharynx clear of any exudate or lesions.Normal dentition.  Neck: normal, supple, no masses, no thyromegaly Respiratory: clear to auscultation bilaterally, no wheezing, bilateral lower extremity crackles.  Normal respiratory effort. No accessory muscle use.  Cardiovascular: Regular rate and rhythm, no murmurs / rubs / gallops.  1+ pedal edema,. 2+ pedal pulses. No carotid bruits.  Abdomen: no tenderness, no masses palpated. No hepatosplenomegaly. Bowel sounds positive.  Musculoskeletal: no clubbing / cyanosis. No joint deformity upper and lower  extremities. Good ROM, no contractures. Normal muscle tone.  Skin: no rashes, lesions, ulcers. No induration Neurologic: CN 2-12 grossly intact. Sensation intact, DTR normal. Strength 5/5 in all 4.  Psychiatric: Normal judgment and insight. Alert and oriented x 3. Normal mood.     Labs on Admission: I have personally reviewed following labs and imaging studies  CBC: Recent Labs  Lab 09/23/19 1600 09/23/19 2136  WBC 8.9 7.7  NEUTROABS 7.1  --   HGB 12.9* 13.2  HCT 40.1 40.4  MCV 95.7 93.5  PLT 117* [id number]*   Basic Metabolic Panel: Recent Labs  Lab 09/19/19 1222 09/23/19 1600 09/23/19 2136  NA 137 134*  --   K 4.8 4.5  --   CL 104 102  --   CO2 21 21*  --   GLUCOSE 88 112*  --   BUN 25 20  --   CREATININE 1.27 1.09 1.17  CALCIUM 9.4 9.3  --    GFR: Estimated Creatinine Clearance: 60.3 mL/min (by C-G formula based on SCr of 1.17 mg/dL). Liver Function Tests: No results for input(s): AST, ALT, ALKPHOS, BILITOT, PROT, ALBUMIN in the last 168 hours. No results for input(s): LIPASE, AMYLASE in the last 168 hours. No results for input(s): AMMONIA in the last 168 hours. Coagulation Profile: No results for input(s): INR, PROTIME in the last 168 hours. Cardiac Enzymes: No results for input(s): CKTOTAL, CKMB, CKMBINDEX, TROPONINI in the last 168 hours. BNP (last 3 results) No results for input(s): PROBNP in the last 8760 hours. HbA1C: No results for input(s): HGBA1C in the last 72 hours. CBG: No results for input(s): GLUCAP in the last 168 hours. Lipid Profile: No results for input(s): CHOL, HDL, LDLCALC, TRIG, CHOLHDL, LDLDIRECT in the last 72 hours. Thyroid Function Tests: No results for input(s): TSH, T4TOTAL, FREET4, T3FREE, THYROIDAB in the last 72 hours. Anemia Panel: No results for input(s): VITAMINB12, FOLATE, FERRITIN, TIBC, IRON, RETICCTPCT in the last 72 hours. Urine analysis:    Component Value Date/Time   COLORURINE STRAW (A) 09/10/2019 2344   APPEARANCEUR  CLEAR 09/10/2019 2344   LABSPEC 1.006 09/10/2019 2344   PHURINE 5.0 09/10/2019 2344   GLUCOSEU NEGATIVE 09/10/2019 2344   HGBUR NEGATIVE 09/10/2019 2344   BILIRUBINUR NEGATIVE 09/10/2019 2344   KETONESUR NEGATIVE 09/10/2019 2344   PROTEINUR NEGATIVE 09/10/2019 2344   UROBILINOGEN 0.2 04/21/2009 0212   NITRITE NEGATIVE 09/10/2019 2344   LEUKOCYTESUR NEGATIVE 09/10/2019 2344   Sepsis Labs: @LABRCNTIP (procalcitonin:4,lacticidven:4) ) Recent Results (from the past 240 hour(s))  SARS CORONAVIRUS 2 (TAT 6-24 HRS) Nasopharyngeal Nasopharyngeal Swab     Status: None   Collection Time: 09/23/19  3:44 PM   Specimen: Nasopharyngeal Swab  Result Value Ref Range Status   SARS Coronavirus 2 NEGATIVE NEGATIVE Final    Comment: (NOTE) SARS-CoV-2 target nucleic acids are NOT DETECTED. The SARS-CoV-2 RNA is generally detectable in upper and lower respiratory specimens during the acute phase of infection. Negative results do not preclude SARS-CoV-2 infection, do not rule out co-infections with other pathogens, and should not be used as the sole basis for treatment or other patient management decisions. Negative results must be combined with clinical observations, patient history, and epidemiological information. The expected result is Negative. Fact Sheet for Patients: SugarRoll.be Fact Sheet for Healthcare Providers: https://www.woods-mathews.com/ This test is not yet approved or cleared by the [place] FDA and  has been authorized for detection and/or diagnosis of SARS-CoV-2 by FDA under an Emergency Use Authorization (EUA). This EUA will remain  in effect (meaning this test can be used) for the duration of the COVID-19 declaration under Section 56 4(b)(1) of the Act, 21 U.S.C. section 360bbb-3(b)(1), unless the authorization is terminated or revoked sooner. Performed at [facility], [place] [address], [place], [place] [address]       Radiological Exams on Admission: Dg Chest Portable 1 View  Result Date: 09/23/2019 CLINICAL DATA:  83 year old male with a history of dyspnea and leg swelling EXAM: PORTABLE CHEST 1 VIEW COMPARISON:  09/10/2019 FINDINGS: Cardiomediastinal silhouette unchanged in size and contour. Surgical changes of median sternotomy. Cardiac event recorder  projects over the left chest. Mixed reticulonodular opacities of the bilateral lung bases with obscuration of the hemidiaphragm bilaterally. No pneumothorax.  No significant interlobular septal thickening. IMPRESSION: Persisting reticulonodular opacities at the lung bases with new small pleural effusions, potentially multifocal infection and/or edema. Electronically Signed   By: Corrie Mckusick D.O.   On: 09/23/2019 16:03    EKG: Independently reviewed.  It shows normal sinus rhythm with a rate of 66.  Probable left atrial enlargement.  Some right bundle branch block.  Unchanged from previous  Assessment/Plan Principal Problem:   Acute CHF (congestive heart failure) (HCC) Active Problems:   Hypertension   Obesity   Aortic stenosis   Obstructive sleep apnea   CAD (coronary artery disease)   Hyperlipidemia   S/P TAVR (transcatheter aortic valve replacement)   Hx of CABG   Gastroesophageal reflux disease     #1 acute exacerbation of diastolic CHF: Last Echo was October 25 showing EF of 60 to 65%.  Moderately increased left ventricular size.  Patient will be admitted for observation.  Initiate IV diuretics.  Transition to oral diuretics at home.  Patient may need to be placed on every other day diuretics if that would be better.  Main issue is his blood pressure that should be watched.  It is okay today.  #2 coronary artery disease: Remains asymptomatic.  No chest pain.  Troponin elevated but could be due to CHF.  Finish cycling.  #3 hypertension: Continue home regimen.  #4 GERD: Continue with PPIs  #5 morbid obesity: Dietary counseling  #6  obstructive sleep apnea: Will require CPAP   DVT prophylaxis: Lovenox Code Status: Full code Family Communication: Wife at bedside Disposition Plan: Home Consults called: None Admission status: Observation  Severity of Illness: The appropriate patient status for this patient is OBSERVATION. Observation status is judged to be reasonable and necessary in order to provide the required intensity of service to ensure the patient's safety. The patient's presenting symptoms, physical exam findings, and initial radiographic and laboratory data in the context of their medical condition is felt to place them at decreased risk for further clinical deterioration. Furthermore, it is anticipated that the patient will be medically stable for discharge from the hospital within 2 midnights of admission. The following factors support the patient status of observation.   " The patient's presenting symptoms include shortness of breath and will lower extremity edema. " The physical exam findings include lower extremity edema. " The initial radiographic and laboratory data are chest x-ray.     Barbette Merino MD [facility] Pager [phone]  If 7PM-7AM, please contact night-coverage [email or web address] Password [facility]  09/23/2019, 10:58 PM

## 2019-09-23 NOTE — ED Notes (Signed)
Pt ambulated to bathroom, while returning to bed he became short of breath and lightheaded. Pt had a small BM with bright red blood and small dark red clot.

## 2019-09-24 ENCOUNTER — Inpatient Hospital Stay (HOSPITAL_COMMUNITY): Admission: RE | Admit: 2019-09-24 | Payer: Medicare Other | Source: Ambulatory Visit

## 2019-09-24 DIAGNOSIS — D649 Anemia, unspecified: Secondary | ICD-10-CM | POA: Diagnosis present

## 2019-09-24 DIAGNOSIS — R0600 Dyspnea, unspecified: Secondary | ICD-10-CM | POA: Diagnosis not present

## 2019-09-24 DIAGNOSIS — I25119 Atherosclerotic heart disease of native coronary artery with unspecified angina pectoris: Secondary | ICD-10-CM | POA: Diagnosis not present

## 2019-09-24 DIAGNOSIS — K2970 Gastritis, unspecified, without bleeding: Secondary | ICD-10-CM | POA: Diagnosis present

## 2019-09-24 DIAGNOSIS — G4733 Obstructive sleep apnea (adult) (pediatric): Secondary | ICD-10-CM | POA: Diagnosis present

## 2019-09-24 DIAGNOSIS — Z952 Presence of prosthetic heart valve: Secondary | ICD-10-CM | POA: Diagnosis not present

## 2019-09-24 DIAGNOSIS — D123 Benign neoplasm of transverse colon: Secondary | ICD-10-CM | POA: Diagnosis not present

## 2019-09-24 DIAGNOSIS — D696 Thrombocytopenia, unspecified: Secondary | ICD-10-CM | POA: Diagnosis present

## 2019-09-24 DIAGNOSIS — Z20828 Contact with and (suspected) exposure to other viral communicable diseases: Secondary | ICD-10-CM | POA: Diagnosis present

## 2019-09-24 DIAGNOSIS — I5031 Acute diastolic (congestive) heart failure: Secondary | ICD-10-CM

## 2019-09-24 DIAGNOSIS — I25118 Atherosclerotic heart disease of native coronary artery with other forms of angina pectoris: Secondary | ICD-10-CM | POA: Diagnosis not present

## 2019-09-24 DIAGNOSIS — I110 Hypertensive heart disease with heart failure: Secondary | ICD-10-CM | POA: Diagnosis present

## 2019-09-24 DIAGNOSIS — K648 Other hemorrhoids: Secondary | ICD-10-CM | POA: Diagnosis present

## 2019-09-24 DIAGNOSIS — G4700 Insomnia, unspecified: Secondary | ICD-10-CM | POA: Diagnosis present

## 2019-09-24 DIAGNOSIS — K5730 Diverticulosis of large intestine without perforation or abscess without bleeding: Secondary | ICD-10-CM | POA: Diagnosis present

## 2019-09-24 DIAGNOSIS — I350 Nonrheumatic aortic (valve) stenosis: Secondary | ICD-10-CM | POA: Diagnosis not present

## 2019-09-24 DIAGNOSIS — D125 Benign neoplasm of sigmoid colon: Secondary | ICD-10-CM | POA: Diagnosis not present

## 2019-09-24 DIAGNOSIS — E785 Hyperlipidemia, unspecified: Secondary | ICD-10-CM | POA: Diagnosis present

## 2019-09-24 DIAGNOSIS — I5033 Acute on chronic diastolic (congestive) heart failure: Secondary | ICD-10-CM | POA: Diagnosis not present

## 2019-09-24 DIAGNOSIS — K2980 Duodenitis without bleeding: Secondary | ICD-10-CM | POA: Diagnosis present

## 2019-09-24 DIAGNOSIS — I10 Essential (primary) hypertension: Secondary | ICD-10-CM | POA: Diagnosis not present

## 2019-09-24 DIAGNOSIS — R0602 Shortness of breath: Secondary | ICD-10-CM | POA: Diagnosis not present

## 2019-09-24 DIAGNOSIS — E669 Obesity, unspecified: Secondary | ICD-10-CM | POA: Diagnosis present

## 2019-09-24 DIAGNOSIS — K6289 Other specified diseases of anus and rectum: Secondary | ICD-10-CM | POA: Diagnosis present

## 2019-09-24 DIAGNOSIS — I2510 Atherosclerotic heart disease of native coronary artery without angina pectoris: Secondary | ICD-10-CM | POA: Diagnosis not present

## 2019-09-24 DIAGNOSIS — Z951 Presence of aortocoronary bypass graft: Secondary | ICD-10-CM | POA: Diagnosis not present

## 2019-09-24 DIAGNOSIS — D124 Benign neoplasm of descending colon: Secondary | ICD-10-CM | POA: Diagnosis not present

## 2019-09-24 DIAGNOSIS — K219 Gastro-esophageal reflux disease without esophagitis: Secondary | ICD-10-CM | POA: Diagnosis present

## 2019-09-24 DIAGNOSIS — K921 Melena: Secondary | ICD-10-CM | POA: Diagnosis not present

## 2019-09-24 DIAGNOSIS — K644 Residual hemorrhoidal skin tags: Secondary | ICD-10-CM | POA: Diagnosis present

## 2019-09-24 DIAGNOSIS — R778 Other specified abnormalities of plasma proteins: Secondary | ICD-10-CM | POA: Diagnosis not present

## 2019-09-24 DIAGNOSIS — K635 Polyp of colon: Secondary | ICD-10-CM | POA: Diagnosis present

## 2019-09-24 DIAGNOSIS — I452 Bifascicular block: Secondary | ICD-10-CM | POA: Diagnosis not present

## 2019-09-24 DIAGNOSIS — I440 Atrioventricular block, first degree: Secondary | ICD-10-CM | POA: Diagnosis present

## 2019-09-24 DIAGNOSIS — I509 Heart failure, unspecified: Secondary | ICD-10-CM | POA: Diagnosis not present

## 2019-09-24 DIAGNOSIS — M109 Gout, unspecified: Secondary | ICD-10-CM | POA: Diagnosis present

## 2019-09-24 LAB — CBC WITH DIFFERENTIAL/PLATELET
Abs Immature Granulocytes: 0.03 10*3/uL (ref 0.00–0.07)
Basophils Absolute: 0.1 10*3/uL (ref 0.0–0.1)
Basophils Relative: 1 %
Eosinophils Absolute: 0.1 10*3/uL (ref 0.0–0.5)
Eosinophils Relative: 2 %
HCT: 38.9 % — ABNORMAL LOW (ref 39.0–52.0)
Hemoglobin: 12.9 g/dL — ABNORMAL LOW (ref 13.0–17.0)
Immature Granulocytes: 0 %
Lymphocytes Relative: 14 %
Lymphs Abs: 1.1 10*3/uL (ref 0.7–4.0)
MCH: 30.6 pg (ref 26.0–34.0)
MCHC: 33.2 g/dL (ref 30.0–36.0)
MCV: 92.4 fL (ref 80.0–100.0)
Monocytes Absolute: 0.6 10*3/uL (ref 0.1–1.0)
Monocytes Relative: 8 %
Neutro Abs: 5.7 10*3/uL (ref 1.7–7.7)
Neutrophils Relative %: 75 %
Platelets: 110 10*3/uL — ABNORMAL LOW (ref 150–400)
RBC: 4.21 MIL/uL — ABNORMAL LOW (ref 4.22–5.81)
RDW: 14.4 % (ref 11.5–15.5)
WBC: 7.6 10*3/uL (ref 4.0–10.5)
nRBC: 0 % (ref 0.0–0.2)

## 2019-09-24 LAB — BASIC METABOLIC PANEL
Anion gap: 9 (ref 5–15)
BUN: 19 mg/dL (ref 8–23)
CO2: 21 mmol/L — ABNORMAL LOW (ref 22–32)
Calcium: 9.2 mg/dL (ref 8.9–10.3)
Chloride: 107 mmol/L (ref 98–111)
Creatinine, Ser: 1.17 mg/dL (ref 0.61–1.24)
GFR calc Af Amer: >60 mL/min (ref >60)
GFR calc non Af Amer: 58 mL/min — ABNORMAL LOW (ref >60)
Glucose, Bld: 102 mg/dL — ABNORMAL HIGH (ref 70–99)
Potassium: 4.1 mmol/L (ref 3.5–5.1)
Sodium: 137 mmol/L (ref 135–145)

## 2019-09-24 MED ORDER — RAMIPRIL 2.5 MG PO CAPS
5.0000 mg | ORAL_CAPSULE | Freq: Every day | ORAL | Status: DC
  Administered 2019-09-24 – 2019-09-27 (×4): 5 mg via ORAL
  Filled 2019-09-24 (×4): qty 2

## 2019-09-24 NOTE — Progress Notes (Signed)
Pt has had an uneventful day during my time with him. VSS. Transferred from the bed to the chair, then back to the bed. Wife at bedside. Will continue to monitor. Continue with plan of care.

## 2019-09-24 NOTE — Progress Notes (Signed)
Progress Note    Andre Miranda  [id number] DOB: August 22, 1936  DOA: 09/23/2019 PCP: Crist Infante, MD    Brief Narrative:    Medical records reviewed and are as summarized below:  Andre Miranda is an 83 y.o. male with medical history significant of aortic valve disease status post TAVR, coronary artery disease, hypertension, diastolic dysfunction, GERD, morbid obesity and hyperlipidemia who was previously on diuretics but was discontinued due to relative hypotension.  Patient has been having progressive shortness of breath and worsening lower extremity edema.  He has noted some PND and orthopnea.  Today he felt lightheaded.  He felt like he was heavy on his legs and chest.  The last time he took a diuretic was 10 days ago.  Patient also has known history of diverticulosis and noticed some slightly bloody stools today.  Vitals and hemoglobin remained stable hemodynamically in general stable.  Patient is being admitted with fluid overload indicated for acute CHF exacerbation.    Assessment/Plan:   Principal Problem:   Acute CHF (congestive heart failure) (HCC) Active Problems:   Hypertension   Obesity   Aortic stenosis   Obstructive sleep apnea   CAD (coronary artery disease)   Hyperlipidemia   S/P TAVR (transcatheter aortic valve replacement)   Hx of CABG   Gastroesophageal reflux disease    Dyspnea due to acute on chronic exacerbation of diastolic CHF:  -Last Echo was October 25 showing EF of 60 to 65%.  Moderately increased left ventricular size.  - IV diuretics.    Blood on stool -happened in ER -will monitor closely as he may need GI consult prior to cath -last colonoscopy was 2014 with Dr. Oletta Lamas-- had diverticuli -trend h/h  coronary artery disease:  -plan for heart cath on Monday per Dr. Tamala Julian (was already scheduled as an outpatient)  H/o recurrent syncope -was wearing a zio patch -no events  Hypertension: = Continue home regimen.  GERD:   -Continue with PPIs  obstructive sleep apnea: -CPAP  obesity Body mass index is 33.44 kg/m.   Family Communication/Anticipated D/C date and plan/Code Status   DVT prophylaxis: Lovenox ordered. Code Status: Full Code.  Family Communication:  Disposition Plan: pending heart cath on Monday-- may need GI consult    Medical Consultants:    cards  Subjective:   Needing to urinate  Objective:    Vitals:   09/24/19 0039 09/24/19 0300 09/24/19 0459 09/24/19 0946  BP:   (!) 127/50 (!) 140/43  Pulse: 83  68 72  Resp:   19   Temp:   97.7 F (36.5 C)   TempSrc:   Oral   SpO2:   99%   Weight:  105.7 kg    Height:        Intake/Output Summary (Last 24 hours) at 09/24/2019 1337 Last data filed at 09/24/2019 0030 Gross per 24 hour  Intake -  Output 1800 ml  Net -1800 ml   Filed Weights   09/23/19 1524 09/23/19 2100 09/24/19 0300  Weight: 105.2 kg 105.7 kg 105.7 kg    Exam: In bed, hard of hearing rrr No wheezing, no increased work of breathing Pleasant and cooperative +BS, soft, NT  Data Reviewed:   I have personally reviewed following labs and imaging studies:  Labs: Labs show the following:   Basic Metabolic Panel: Recent Labs  Lab 09/19/19 1222 09/23/19 1600 09/23/19 2136 09/24/19 0327  NA 137 134*  --  137  K 4.8 4.5  --  4.1  CL 104 102  --  107  CO2 21 21*  --  21*  GLUCOSE 88 112*  --  102*  BUN 25 20  --  19  CREATININE 1.27 1.09 1.17 1.17  CALCIUM 9.4 9.3  --  9.2   GFR Estimated Creatinine Clearance: 60.3 mL/min (by C-G formula based on SCr of 1.17 mg/dL). Liver Function Tests: No results for input(s): AST, ALT, ALKPHOS, BILITOT, PROT, ALBUMIN in the last 168 hours. No results for input(s): LIPASE, AMYLASE in the last 168 hours. No results for input(s): AMMONIA in the last 168 hours. Coagulation profile No results for input(s): INR, PROTIME in the last 168 hours.  CBC: Recent Labs  Lab 09/23/19 1600 09/23/19 2136 09/24/19 0327   WBC 8.9 7.7 7.6  NEUTROABS 7.1  --  5.7  HGB 12.9* 13.2 12.9*  HCT 40.1 40.4 38.9*  MCV 95.7 93.5 92.4  PLT 117* 115* 110*   Cardiac Enzymes: No results for input(s): CKTOTAL, CKMB, CKMBINDEX, TROPONINI in the last 168 hours. BNP (last 3 results) No results for input(s): PROBNP in the last 8760 hours. CBG: No results for input(s): GLUCAP in the last 168 hours. D-Dimer: No results for input(s): DDIMER in the last 72 hours. Hgb A1c: No results for input(s): HGBA1C in the last 72 hours. Lipid Profile: No results for input(s): CHOL, HDL, LDLCALC, TRIG, CHOLHDL, LDLDIRECT in the last 72 hours. Thyroid function studies: No results for input(s): TSH, T4TOTAL, T3FREE, THYROIDAB in the last 72 hours.  Invalid input(s): FREET3 Anemia work up: No results for input(s): VITAMINB12, FOLATE, FERRITIN, TIBC, IRON, RETICCTPCT in the last 72 hours. Sepsis Labs: Recent Labs  Lab 09/23/19 1600 09/23/19 2136 09/24/19 0327  WBC 8.9 7.7 7.6    Microbiology Recent Results (from the past 240 hour(s))  SARS CORONAVIRUS 2 (TAT 6-24 HRS) Nasopharyngeal Nasopharyngeal Swab     Status: None   Collection Time: 09/23/19  3:44 PM   Specimen: Nasopharyngeal Swab  Result Value Ref Range Status   SARS Coronavirus 2 NEGATIVE NEGATIVE Final    Comment: (NOTE) SARS-CoV-2 target nucleic acids are NOT DETECTED. The SARS-CoV-2 RNA is generally detectable in upper and lower respiratory specimens during the acute phase of infection. Negative results do not preclude SARS-CoV-2 infection, do not rule out co-infections with other pathogens, and should not be used as the sole basis for treatment or other patient management decisions. Negative results must be combined with clinical observations, patient history, and epidemiological information. The expected result is Negative. Fact Sheet for Patients: SugarRoll.be Fact Sheet for Healthcare Providers:  https://www.woods-mathews.com/ This test is not yet approved or cleared by the [place] FDA and  has been authorized for detection and/or diagnosis of SARS-CoV-2 by FDA under an Emergency Use Authorization (EUA). This EUA will remain  in effect (meaning this test can be used) for the duration of the COVID-19 declaration under Section 56 4(b)(1) of the Act, 21 U.S.C. section 360bbb-3(b)(1), unless the authorization is terminated or revoked sooner. Performed at [facility], [place] [address], [place], [place] [address]     Procedures and diagnostic studies:  Dg Chest Portable 1 View  Result Date: 09/23/2019 CLINICAL DATA:  83 year old male with a history of dyspnea and leg swelling EXAM: PORTABLE CHEST 1 VIEW COMPARISON:  09/10/2019 FINDINGS: Cardiomediastinal silhouette unchanged in size and contour. Surgical changes of median sternotomy. Cardiac event recorder projects over the left chest. Mixed reticulonodular opacities of the bilateral lung bases with obscuration of the hemidiaphragm  bilaterally. No pneumothorax.  No significant interlobular septal thickening. IMPRESSION: Persisting reticulonodular opacities at the lung bases with new small pleural effusions, potentially multifocal infection and/or edema. Electronically Signed   By: Corrie Mckusick D.O.   On: 09/23/2019 16:03    Medications:   . allopurinol  300 mg Oral Daily  . amLODipine  10 mg Oral Daily  . aspirin EC  81 mg Oral QHS  . cholecalciferol  1,000 Units Oral QHS  . enoxaparin (LOVENOX) injection  40 mg Subcutaneous Q24H  . famotidine  20 mg Oral Daily  . furosemide  40 mg Intravenous BID  . metoprolol tartrate  25 mg Oral BID  . multivitamin with minerals  1 tablet Oral Daily  . ramipril  5 mg Oral Daily  . rosuvastatin  10 mg Oral QPM  . sodium chloride flush  3 mL Intravenous Q12H  . tamsulosin  0.4 mg Oral QPM   Continuous Infusions: . sodium chloride       LOS: 0 days   Geradine Girt   [facility]   How to contact the [facility] Attending or Consulting provider [place] or covering provider during after hours [place], for this patient?  1. Check the care team in [facility] and look for a) attending/consulting [facility] provider listed and b) the [facility] team listed 2. Log into [email or web address] and use [place]'s universal password to access. If you do not have the password, please contact the hospital operator. 3. Locate the [facility] provider you are looking for under [facility] and page to a number that you can be directly reached. 4. If you still have difficulty reaching the provider, please page the [facility] (Director on Call) for the Hospitalists listed on amion for assistance.  09/24/2019, 1:37 PM

## 2019-09-25 ENCOUNTER — Encounter (HOSPITAL_COMMUNITY): Payer: Self-pay

## 2019-09-25 DIAGNOSIS — R0600 Dyspnea, unspecified: Secondary | ICD-10-CM

## 2019-09-25 LAB — CBC
HCT: 38.3 % — ABNORMAL LOW (ref 39.0–52.0)
Hemoglobin: 12.4 g/dL — ABNORMAL LOW (ref 13.0–17.0)
MCH: 30.5 pg (ref 26.0–34.0)
MCHC: 32.4 g/dL (ref 30.0–36.0)
MCV: 94.3 fL (ref 80.0–100.0)
Platelets: 104 10*3/uL — ABNORMAL LOW (ref 150–400)
RBC: 4.06 MIL/uL — ABNORMAL LOW (ref 4.22–5.81)
RDW: 14.6 % (ref 11.5–15.5)
WBC: 7.8 10*3/uL (ref 4.0–10.5)
nRBC: 0 % (ref 0.0–0.2)

## 2019-09-25 LAB — BASIC METABOLIC PANEL
Anion gap: 9 (ref 5–15)
BUN: 24 mg/dL — ABNORMAL HIGH (ref 8–23)
CO2: 25 mmol/L (ref 22–32)
Calcium: 9.3 mg/dL (ref 8.9–10.3)
Chloride: 103 mmol/L (ref 98–111)
Creatinine, Ser: 1.27 mg/dL — ABNORMAL HIGH (ref 0.61–1.24)
GFR calc Af Amer: >60 mL/min (ref >60)
GFR calc non Af Amer: 53 mL/min — ABNORMAL LOW (ref >60)
Glucose, Bld: 101 mg/dL — ABNORMAL HIGH (ref 70–99)
Potassium: 3.9 mmol/L (ref 3.5–5.1)
Sodium: 137 mmol/L (ref 135–145)

## 2019-09-25 MED ORDER — PEG-KCL-NACL-NASULF-NA ASC-C 100 G PO SOLR
1.0000 | Freq: Once | ORAL | Status: AC
  Administered 2019-09-25: 17:00:00 200 g via ORAL
  Filled 2019-09-25: qty 1

## 2019-09-25 MED ORDER — BISACODYL 10 MG RE SUPP
10.0000 mg | Freq: Once | RECTAL | Status: AC
  Administered 2019-09-25: 21:00:00 10 mg via RECTAL
  Filled 2019-09-25: qty 1

## 2019-09-25 NOTE — Progress Notes (Addendum)
Progress Note  Patient Name: LORIS NOCELLA Date of Encounter: 09/25/2019  Primary Cardiologist: Sinclair Grooms, MD   Subjective   No complaints  Inpatient Medications    Scheduled Meds: . allopurinol  300 mg Oral Daily  . amLODipine  10 mg Oral Daily  . aspirin EC  81 mg Oral QHS  . cholecalciferol  1,000 Units Oral QHS  . enoxaparin (LOVENOX) injection  40 mg Subcutaneous Q24H  . famotidine  20 mg Oral Daily  . furosemide  40 mg Intravenous BID  . metoprolol tartrate  25 mg Oral BID  . multivitamin with minerals  1 tablet Oral Daily  . ramipril  5 mg Oral Daily  . rosuvastatin  10 mg Oral QPM  . sodium chloride flush  3 mL Intravenous Q12H  . tamsulosin  0.4 mg Oral QPM   Continuous Infusions: . sodium chloride     PRN Meds: sodium chloride, acetaminophen, ondansetron (ZOFRAN) IV, sodium chloride flush, temazepam   Vital Signs    Vitals:   09/24/19 1948 09/24/19 2100 09/25/19 0358 09/25/19 0820  BP: (!) 130/47  (!) 134/49 (!) 128/46  Pulse: 69 66 70   Resp: 16 19 18    Temp: 98.2 F (36.8 C)  97.7 F (36.5 C)   TempSrc: Oral  Oral   SpO2: 95% 96% 96%   Weight:   102.6 kg   Height:        Intake/Output Summary (Last 24 hours) at 09/25/2019 0918 Last data filed at 09/25/2019 0300 Gross per 24 hour  Intake 220 ml  Output 2750 ml  Net -2530 ml   Last 3 Weights 09/25/2019 09/24/2019 09/23/2019  Weight (lbs) 226 lb 3.2 oz 233 lb 0.4 oz 233 lb 0.4 oz  Weight (kg) 102.604 kg 105.7 kg 105.7 kg      Telemetry    SR - Personally Reviewed  ECG    n/a - Personally Reviewed  Physical Exam   GEN: No acute distress.   Neck: No JVD Cardiac: RRR, no murmurs, rubs, or gallops.  Respiratory: Clear to auscultation bilaterally. GI: Soft, nontender, non-distended  MS: No edema; No deformity. Neuro:  Nonfocal  Psych: Normal affect   Labs    High Sensitivity Troponin:   Recent Labs  Lab 09/10/19 2201 09/10/19 2350 09/11/19 0132 09/23/19 1600  09/23/19 1742  TROPONINIHS 64* 58* 59* 150* 133*      Chemistry Recent Labs  Lab 09/23/19 1600 09/23/19 2136 09/24/19 0327 09/25/19 0353  NA 134*  --  137 137  K 4.5  --  4.1 3.9  CL 102  --  107 103  CO2 21*  --  21* 25  GLUCOSE 112*  --  102* 101*  BUN 20  --  19 24*  CREATININE 1.09 1.17 1.17 1.27*  CALCIUM 9.3  --  9.2 9.3  GFRNONAA >60 58* 58* 53*  GFRAA >60 >60 >60 >60  ANIONGAP 11  --  9 9     Hematology Recent Labs  Lab 09/23/19 2136 09/24/19 0327 09/25/19 0353  WBC 7.7 7.6 7.8  RBC 4.32 4.21* 4.06*  HGB 13.2 12.9* 12.4*  HCT 40.4 38.9* 38.3*  MCV 93.5 92.4 94.3  MCH 30.6 30.6 30.5  MCHC 32.7 33.2 32.4  RDW 14.5 14.4 14.6  PLT 115* 110* 104*    BNP Recent Labs  Lab 09/23/19 1600  BNP 581.7*     DDimer No results for input(s): DDIMER in the last 168 hours.  Radiology    Dg Chest Portable 1 View  Result Date: 09/23/2019 CLINICAL DATA:  83 year old male with a history of dyspnea and leg swelling EXAM: PORTABLE CHEST 1 VIEW COMPARISON:  09/10/2019 FINDINGS: Cardiomediastinal silhouette unchanged in size and contour. Surgical changes of median sternotomy. Cardiac event recorder projects over the left chest. Mixed reticulonodular opacities of the bilateral lung bases with obscuration of the hemidiaphragm bilaterally. No pneumothorax.  No significant interlobular septal thickening. IMPRESSION: Persisting reticulonodular opacities at the lung bases with new small pleural effusions, potentially multifocal infection and/or edema. Electronically Signed   By: Corrie Mckusick D.O.   On: 09/23/2019 16:03    Cardiac Studies     Patient Profile     KRU NOVICK is a 83 y.o. male with a hx of CAD with prior CABG in 2003, [facility]/p [facility], OSA, hyperlipidemia,difficult to controlHTN, and chronic diastolic HF who is being seen today for the evaluation of CHF and elevated troponin  at the request of Dr. Regenia Skeeter.   He  last saw Dr. Tamala Julian back in July - had had recent admission for acute diastolic HF.Endorsed mild DOE first thing most mornings and mild chest tightness if walks too long/too hard. He was felt to still have volume overload on exam - diuretics were increased - possible ischemic evaluation ifsymptomsdid not improve.  Admitted with a"near"syncopal spell on 09/10/2019. It was suspected to be due to hypotension.  Seen by Truitt Merle on 09/19/2019 for 2 syncope episode x 2 weeks. Bifascicular block with 1st degree AV block on EKG. Also had chest tightness with exertion. Continued low dose BB. Applied Zio patch for 14 days then event monitor for 30 days. Held diuretics.   Due to ongoing symptoms (chest tightness) he is set up for cath on 09/27/2019 with Dr. Tamala Julian.   Assessment & Plan    1. Acute on chronic diastolic HF - [id number] echo LVEF 60-65%, no WMA. AVR moder perivalvular leak - negative 2.5 L yesterday, neg 4.3 L since admission. He is on lasix 40mg  IV bid, uptrend in Cr. Will d/c IV lasix after AM dose and reassess tomorrow AM  2.  Exertional chest tightness with history of CAD and prior CABG -He suggestive symptoms of stable angina and was scheduled for outpatient cardiac cath on Tuesday - trop up to 150 this admit though in setting of HF - plan for inpatient cath Monday, LHC pending evaluation by GI. Will make npo but not place orders as of yet.   3. Heme + stool - mild downtrend in Hgb,he reports 2 episodes of small hard stool with small amount of clots, some bright red blood with wiping only - patient reports he discussed with hospitalist today who is to discuss with GI  4. History of TAVR at [facility] 2016   For questions or updates, please contact [facility] Please consult [email or web address] for contact info under        Signed, Carlyle Dolly, MD  09/25/2019, 9:18 AM

## 2019-09-25 NOTE — Progress Notes (Signed)
Pt places self on cpap. Will monitor 

## 2019-09-25 NOTE — Consult Note (Signed)
Andre Miranda  Referring Provider: [facility] Primary Care Physician:  Crist Infante, MD Primary Gastroenterologist:  Dr. Laurence Spates  Reason for Consultation:  hematochezia  HPI: Andre Miranda is a 83 y.o. male admitted for weakness, shortness of breath and hematochezia.  Found to be in heart failure, has significantly improved with diuretics.  He has had some hard pellet like stools with some straining, as well as bright red hematochezia with large blood clots, other the past few days.  None today.  Has history of colonic polyps and last colonoscopy 2014 with Dr. Oletta Lamas.  Some mild GERD.  No abdominal pain, hematemesis, melena.  On baby ASA at home, no other blood thinners.   Past Medical History:  Diagnosis Date  . Aortic stenosis    MODERATE  . Cancer [facility])    prostate cancer-radiation  . Carotid arterial disease ([place])   . Coronary artery disease   . Dysuria   . GERD (gastroesophageal reflux disease)   . Gout   . Hematuria   . History of urinary retention   . Hyperlipidemia   . Hypertension   . Obesity   . Sleep apnea    uses CPAP nightly    Past Surgical History:  Procedure Laterality Date  . AORTIC VALVE REPAIR  February 06, 2015   Dr. Aline Brochure and Dr. Ysidro Evert  at [facility]  . [place]  02/2008  . CHOLECYSTECTOMY  04/2002  . CORONARY ARTERY BYPASS GRAFT  2003   LIMA TO THE LAD, RIMA TO THE RCA, AND A SAPHENOUS VEIN GRAFT TO THE INTERMEDIATE AND DISTAL LEFT CIRCUMFLEX  . ORIF ANKLE FRACTURE Right 02/14/2019   Procedure: OPEN REDUCTION INTERNAL FIXATION (ORIF) ANKLE FRACTURE;  Surgeon: Netta Cedars, MD;  Location: [place];  Service: Orthopedics;  Laterality: Right;  . US ECHOCARDIOGRAPHY  08/2009   SHOWED A MEAN GRADIENT ACROSS IS AORTIC VALVE OF 24 MM OF MERCURY. HE HAD MODERATE LVH AND NORMAL LV FUNCTION    Prior to Admission medications   Medication Sig Start Date End Date Taking? Authorizing  Provider  allopurinol (ZYLOPRIM) 300 MG tablet Take 300 mg by mouth daily.     Yes [provider]  amLODipine (NORVASC) 10 MG tablet TAKE 1 TABLET BY MOUTH ONCE DAILY Patient taking differently: Take 10 mg by mouth daily.  12/29/18  Yes Belva Crome, MD  amoxicillin (AMOXIL) 500 MG capsule Take 2,000 mg by mouth See admin instructions. Take 2,000 mg by mouth one hour prior to dental appointments   Yes [provider]  aspirin EC 81 MG tablet Take 81 mg by mouth at bedtime.  11/23/12  Yes Larey Dresser, MD  Cholecalciferol (VITAMIN D3) 1000 UNITS CAPS Take 1,000 Units by mouth at bedtime.    Yes [provider]  famotidine (PEPCID) 20 MG tablet Take 20 mg by mouth daily before breakfast.    Yes [provider]  metoprolol tartrate (LOPRESSOR) 25 MG tablet Take 1 tablet (25 mg total) by mouth 2 (two) times daily. 09/06/19 12/05/19 Yes Burtis Junes, NP  Multiple Vitamins-Minerals (CENTRUM SILVER) tablet Take 1 tablet by mouth daily with breakfast.    Yes [provider]  PRESCRIPTION MEDICATION CPAP: At bedtime   Yes [provider]  ramipril (ALTACE) 5 MG capsule Take 1 capsule (5 mg total) by mouth daily. 09/06/19 12/05/19 Yes Burtis Junes, NP  rosuvastatin (CRESTOR) 20 MG tablet Take 10 mg by mouth every evening.  Yes [provider]  Tamsulosin HCl (FLOMAX) 0.4 MG CAPS Take 0.4 mg by mouth every evening.    Yes [provider]  temazepam (RESTORIL) 30 MG capsule Take 30 mg by mouth at bedtime as needed for sleep.   Yes [provider]  levothyroxine (SYNTHROID) 25 MCG tablet Take 1 tablet (25 mcg total) by mouth daily before breakfast. Patient not taking: Reported on 09/23/2019 09/20/19   Burtis Junes, NP    Current Facility-Administered Medications  Medication Dose Route Frequency Provider Last Rate Last Dose  . 0.9 %  sodium chloride infusion  250 mL Intravenous PRN Elwyn Reach, MD      .  acetaminophen (TYLENOL) tablet 650 mg  650 mg Oral Q4H PRN Gala Romney L, MD      . allopurinol (ZYLOPRIM) tablet 300 mg  300 mg Oral Daily Elwyn Reach, MD   300 mg at 09/25/19 0927  . amLODipine (NORVASC) tablet 10 mg  10 mg Oral Daily Elwyn Reach, MD   10 mg at 09/25/19 0928  . aspirin EC tablet 81 mg  81 mg Oral QHS Elwyn Reach, MD   81 mg at 09/24/19 2106  . cholecalciferol (VITAMIN D3) tablet 1,000 Units  1,000 Units Oral QHS Elwyn Reach, MD   1,000 Units at 09/24/19 2106  . enoxaparin (LOVENOX) injection 40 mg  40 mg Subcutaneous Q24H Gala Romney L, MD   40 mg at 09/25/19 0929  . famotidine (PEPCID) tablet 20 mg  20 mg Oral Daily Elwyn Reach, MD   20 mg at 09/25/19 0926  . metoprolol tartrate (LOPRESSOR) tablet 25 mg  25 mg Oral BID Elwyn Reach, MD   25 mg at 09/25/19 0929  . multivitamin with minerals tablet 1 tablet  1 tablet Oral Daily Elwyn Reach, MD   1 tablet at 09/25/19 0926  . ondansetron (ZOFRAN) injection 4 mg  4 mg Intravenous Q6H PRN Gala Romney L, MD      . ramipril (ALTACE) capsule 5 mg  5 mg Oral Daily Vann, Jessica U, DO   5 mg at 09/25/19 0928  . rosuvastatin (CRESTOR) tablet 10 mg  10 mg Oral QPM Gala Romney L, MD   10 mg at 09/24/19 1707  . sodium chloride flush (NS) 0.9 % injection 3 mL  3 mL Intravenous Q12H Gala Romney L, MD   3 mL at 09/25/19 1146  . sodium chloride flush (NS) 0.9 % injection 3 mL  3 mL Intravenous PRN Gala Romney L, MD   3 mL at 09/24/19 1710  . tamsulosin (FLOMAX) capsule 0.4 mg  0.4 mg Oral QPM Gala Romney L, MD   0.4 mg at 09/24/19 1707  . temazepam (RESTORIL) capsule 30 mg  30 mg Oral QHS PRN Elwyn Reach, MD        Allergies as of 09/23/2019 - Review Complete 09/23/2019  Allergen Reaction Noted  . Ibuprofen Hypertension 04/03/2011    Family History  Problem Relation Age of Onset  . Heart attack Father   . Pneumonia Mother 34    Social History   Socioeconomic  History  . Marital status: Married    Spouse name: Not on file  . Number of children: Not on file  . Years of education: Not on file  . Highest education level: Not on file  Occupational History  . Not on file  Social Needs  . Financial resource strain: Not on file  . Food  insecurity    Worry: Not on file    Inability: Not on file  . Transportation needs    Medical: Not on file    Non-medical: Not on file  Tobacco Use  . Smoking status: Former Smoker    Packs/day: 1.50    Years: 7.00    Pack years: 10.50    Types: Cigarettes    Quit date: 04/03/1971    Years since quitting: 48.5  . Smokeless tobacco: Never Used  Substance and Sexual Activity  . Alcohol use: Yes    Comment: social  . Drug use: No  . Sexual activity: Not on file  Lifestyle  . Physical activity    Days per week: Not on file    Minutes per session: Not on file  . Stress: Not on file  Relationships  . Social Herbalist on phone: Not on file    Gets together: Not on file    Attends religious service: Not on file    Active member of club or organization: Not on file    Attends meetings of clubs or organizations: Not on file    Relationship status: Not on file  . Intimate partner violence    Fear of current or ex partner: Not on file    Emotionally abused: Not on file    Physically abused: Not on file    Forced sexual activity: Not on file  Other Topics Concern  . Not on file  Social History Narrative  . Not on file    Review of Systems: As per HPI, all others negative  Physical Exam: Vital signs in last 24 hours: Temp:  [97.7 F (36.5 C)-98.2 F (36.8 C)] 97.7 F (36.5 C) (11/08 0358) Pulse Rate:  [66-71] 71 (11/08 0926) Resp:  [16-19] 18 (11/08 0358) BP: (128-135)/(44-49) 130/45 (11/08 0926) SpO2:  [95 %-96 %] 96 % (11/08 0358) Weight:  [102.6 kg] 102.6 kg (11/08 0358) Last BM Date: 09/23/19 General:   Alert,  Well-developed, well-nourished, pleasant and cooperative in NAD Head:   Normocephalic and atraumatic. Eyes:  Sclera clear, no icterus.   Conjunctiva pink. Ears:  Normal auditory acuity. Nose:  No deformity, discharge,  or lesions. Mouth:  No deformity or lesions.  Oropharynx pink & moist. Abdomen:  Soft, protuberant, nontender and nondistended. No masses, hepatosplenomegaly or hernias noted. No guarding, and without rebound.     Msk:  Symmetrical without gross deformities. Normal posture. Pulses:  Normal pulses noted. Extremities: 1+ edema bilateral lower extremities to mid-shin Neurologic:  Alert and  oriented x4;  grossly normal neurologically. Skin:  Scattered ecchymoses, otherwise intact without significant lesions or rashes. Psych:  Alert and cooperative. Normal mood and affect.   Lab Results: Recent Labs    09/23/19 2136 09/24/19 0327 09/25/19 0353  WBC 7.7 7.6 7.8  HGB 13.2 12.9* 12.4*  HCT 40.4 38.9* 38.3*  PLT 115* 110* 104*   BMET Recent Labs    09/23/19 1600 09/23/19 2136 09/24/19 0327 09/25/19 0353  NA 134*  --  137 137  K 4.5  --  4.1 3.9  CL 102  --  107 103  CO2 21*  --  21* 25  GLUCOSE 112*  --  102* 101*  BUN 20  --  19 24*  CREATININE 1.09 1.17 1.17 1.27*  CALCIUM 9.3  --  9.2 9.3   LFT No results for input(s): PROT, ALBUMIN, AST, ALT, ALKPHOS, BILITOT, BILIDIR, IBILI in the last 72 hours. PT/INR No results  for input(s): LABPROT, INR in the last 72 hours.  Studies/Results: Dg Chest Portable 1 View  Result Date: 09/23/2019 CLINICAL DATA:  83 year old male with a history of dyspnea and leg swelling EXAM: PORTABLE CHEST 1 VIEW COMPARISON:  09/10/2019 FINDINGS: Cardiomediastinal silhouette unchanged in size and contour. Surgical changes of median sternotomy. Cardiac event recorder projects over the left chest. Mixed reticulonodular opacities of the bilateral lung bases with obscuration of the hemidiaphragm bilaterally. No pneumothorax.  No significant interlobular septal thickening. IMPRESSION: Persisting reticulonodular  opacities at the lung bases with new small pleural effusions, potentially multifocal infection and/or edema. Electronically Signed   By: Corrie Mckusick D.O.   On: 09/23/2019 16:03    Impression:  1.  Fatigue, weakness, shortness of breath.  Likely multifactorial (anemia and heart failure).  Lots of diuresis overnight and patient feels much better. 2.  Intermittent chest pressure, thought stable angina by cardiology, outpatient catheterization next week had been planned. 3.  Hematochezia with clots.  History of colon polyps.  Last colonoscopy 2014 with Dr. Oletta Lamas 4.  Thrombocytopenia, 100-120s.  Reviewing old labs, this is chronic and dates back to at least 2009.  Unclear etiology.  No recent liver imaging so far as I'm aware.  Plan:  1.  Given patient's history of polyps and his hematochezia as well as his mild thrombocytopenia, and since his heart catheterization doesn't seem urgent, I have advised colonoscopy +/- endoscopy to be done pre-catheterization.  We have arranged these procedures for tomorrow 1130 am with Dr. Alessandra Bevels. 2.  Risks (bleeding, infection, bowel perforation that could require surgery, sedation-related changes in cardiopulmonary systems), benefits (identification and possible treatment of source of symptoms, exclusion of certain causes of symptoms), and alternatives (watchful waiting, radiographic imaging studies, empiric medical treatment) of upper endoscopy (EGD) were explained to patient/family in detail and patient wishes to proceed. 3.  Risks (bleeding, infection, bowel perforation that could require surgery, sedation-related changes in cardiopulmonary systems), benefits (identification and possible treatment of source of symptoms, exclusion of certain causes of symptoms), and alternatives (watchful waiting, radiographic imaging studies, empiric medical treatment) of colonoscopy were explained to patient/family in detail and patient wishes to proceed. 4.  Next step in  management pending colonoscopy +/- endoscopy findings.   LOS: 1 day   Hilja Kintzel,[place] M  09/25/2019, 3:33 PM  Cell [phone] If no answer or after 5 PM call [phone]

## 2019-09-25 NOTE — Progress Notes (Signed)
Progress Note    Andre Miranda  [id number] DOB: 1936/05/15  DOA: 09/23/2019 PCP: Crist Infante, MD    Brief Narrative:    Medical records reviewed and are as summarized below:  Andre Miranda is an 83 y.o. male with medical history significant of aortic valve disease status post TAVR, coronary artery disease, hypertension, diastolic dysfunction, GERD, morbid obesity and hyperlipidemia who was previously on diuretics but was discontinued due to relative hypotension.  Patient has been having progressive shortness of breath and worsening lower extremity edema.  He has noted some PND and orthopnea.  Today he felt lightheaded.  He felt like he was heavy on his legs and chest.  The last time he took a diuretic was 10 days ago.  Patient also has known history of diverticulosis and noticed some slightly bloody stools today.  Vitals and hemoglobin remained stable hemodynamically in general stable.  Patient is being admitted with fluid overload indicated for acute CHF exacerbation.    Assessment/Plan:   Principal Problem:   Acute CHF (congestive heart failure) (HCC) Active Problems:   Hypertension   Obesity   Aortic stenosis   Obstructive sleep apnea   CAD (coronary artery disease)   Hyperlipidemia   S/P TAVR (transcatheter aortic valve replacement)   Hx of CABG   Gastroesophageal reflux disease   Dyspnea    Dyspnea due to acute on chronic exacerbation of diastolic CHF:  -Last Echo was October 25 showing EF of 60 to 65%.  Moderately increased left ventricular size.  -down 3 L  Blood on stool -happened in ER and at home-- none since -will ask for GI consult prior to cath -last colonoscopy was 2014 with Dr. Oletta Lamas-- had diverticuli -trend h/h  coronary artery disease:  -plan for heart cath on Monday per Dr. Tamala Julian (was already scheduled as an outpatient)  H/o recurrent syncope -was wearing a zio patch -no events  Hypertension: -Continue home regimen.  GERD:   -Continue with PPIs  obstructive sleep apnea: -CPAP  obesity Body mass index is 32.46 kg/m.   Family Communication/Anticipated D/C date and plan/Code Status   DVT prophylaxis: Lovenox ordered. Code Status: Full Code.  Family Communication:  Disposition Plan: pending heart cath on Monday-- GI consult prior   Medical Consultants:    Cards  GI  Subjective:   No BM in last 2 days  Objective:    Vitals:   09/24/19 2100 09/25/19 0358 09/25/19 0820 09/25/19 0926  BP:  (!) 134/49 (!) 128/46 (!) 130/45  Pulse: 66 70  71  Resp: 19 18    Temp:  97.7 F (36.5 C)    TempSrc:  Oral    SpO2: 96% 96%    Weight:  102.6 kg    Height:        Intake/Output Summary (Last 24 hours) at 09/25/2019 1052 Last data filed at 09/25/2019 1011 Gross per 24 hour  Intake 460 ml  Output 3350 ml  Net -2890 ml   Filed Weights   09/23/19 2100 09/24/19 0300 09/25/19 0358  Weight: 105.7 kg 105.7 kg 102.6 kg    Exam: In bed, NAD rrr No increased work of breathing No LE edema Pleasant and cooperative  Data Reviewed:   I have personally reviewed following labs and imaging studies:  Labs: Labs show the following:   Basic Metabolic Panel: Recent Labs  Lab 09/19/19 1222 09/23/19 1600 09/23/19 2136 09/24/19 0327 09/25/19 0353  NA 137 134*  --  137 137  K  4.8 4.5  --  4.1 3.9  CL 104 102  --  107 103  CO2 21 21*  --  21* 25  GLUCOSE 88 112*  --  102* 101*  BUN 25 20  --  19 24*  CREATININE 1.27 1.09 1.17 1.17 1.27*  CALCIUM 9.4 9.3  --  9.2 9.3   GFR Estimated Creatinine Clearance: 54.7 mL/min (A) (by C-G formula based on SCr of 1.27 mg/dL (H)). Liver Function Tests: No results for input(s): AST, ALT, ALKPHOS, BILITOT, PROT, ALBUMIN in the last 168 hours. No results for input(s): LIPASE, AMYLASE in the last 168 hours. No results for input(s): AMMONIA in the last 168 hours. Coagulation profile No results for input(s): INR, PROTIME in the last 168 hours.  CBC: Recent  Labs  Lab 09/23/19 1600 09/23/19 2136 09/24/19 0327 09/25/19 0353  WBC 8.9 7.7 7.6 7.8  NEUTROABS 7.1  --  5.7  --   HGB 12.9* 13.2 12.9* 12.4*  HCT 40.1 40.4 38.9* 38.3*  MCV 95.7 93.5 92.4 94.3  PLT 117* 115* 110* 104*   Cardiac Enzymes: No results for input(s): CKTOTAL, CKMB, CKMBINDEX, TROPONINI in the last 168 hours. BNP (last 3 results) No results for input(s): PROBNP in the last 8760 hours. CBG: No results for input(s): GLUCAP in the last 168 hours. D-Dimer: No results for input(s): DDIMER in the last 72 hours. Hgb A1c: No results for input(s): HGBA1C in the last 72 hours. Lipid Profile: No results for input(s): CHOL, HDL, LDLCALC, TRIG, CHOLHDL, LDLDIRECT in the last 72 hours. Thyroid function studies: No results for input(s): TSH, T4TOTAL, T3FREE, THYROIDAB in the last 72 hours.  Invalid input(s): FREET3 Anemia work up: No results for input(s): VITAMINB12, FOLATE, FERRITIN, TIBC, IRON, RETICCTPCT in the last 72 hours. Sepsis Labs: Recent Labs  Lab 09/23/19 1600 09/23/19 2136 09/24/19 0327 09/25/19 0353  WBC 8.9 7.7 7.6 7.8    Microbiology Recent Results (from the past 240 hour(s))  SARS CORONAVIRUS 2 (TAT 6-24 HRS) Nasopharyngeal Nasopharyngeal Swab     Status: None   Collection Time: 09/23/19  3:44 PM   Specimen: Nasopharyngeal Swab  Result Value Ref Range Status   SARS Coronavirus 2 NEGATIVE NEGATIVE Final    Comment: (NOTE) SARS-CoV-2 target nucleic acids are NOT DETECTED. The SARS-CoV-2 RNA is generally detectable in upper and lower respiratory specimens during the acute phase of infection. Negative results do not preclude SARS-CoV-2 infection, do not rule out co-infections with other pathogens, and should not be used as the sole basis for treatment or other patient management decisions. Negative results must be combined with clinical observations, patient history, and epidemiological information. The expected result is Negative. Fact Sheet for  Patients: SugarRoll.be Fact Sheet for Healthcare Providers: https://www.woods-mathews.com/ This test is not yet approved or cleared by the [place] FDA and  has been authorized for detection and/or diagnosis of SARS-CoV-2 by FDA under an Emergency Use Authorization (EUA). This EUA will remain  in effect (meaning this test can be used) for the duration of the COVID-19 declaration under Section 56 4(b)(1) of the Act, 21 U.S.C. section 360bbb-3(b)(1), unless the authorization is terminated or revoked sooner. Performed at [facility], [place] [address], [place], [place] [address]     Procedures and diagnostic studies:  Dg Chest Portable 1 View  Result Date: 09/23/2019 CLINICAL DATA:  83 year old male with a history of dyspnea and leg swelling EXAM: PORTABLE CHEST 1 VIEW COMPARISON:  09/10/2019 FINDINGS: Cardiomediastinal silhouette unchanged in size and  contour. Surgical changes of median sternotomy. Cardiac event recorder projects over the left chest. Mixed reticulonodular opacities of the bilateral lung bases with obscuration of the hemidiaphragm bilaterally. No pneumothorax.  No significant interlobular septal thickening. IMPRESSION: Persisting reticulonodular opacities at the lung bases with new small pleural effusions, potentially multifocal infection and/or edema. Electronically Signed   By: Corrie Mckusick D.O.   On: 09/23/2019 16:03    Medications:   . allopurinol  300 mg Oral Daily  . amLODipine  10 mg Oral Daily  . aspirin EC  81 mg Oral QHS  . cholecalciferol  1,000 Units Oral QHS  . enoxaparin (LOVENOX) injection  40 mg Subcutaneous Q24H  . famotidine  20 mg Oral Daily  . metoprolol tartrate  25 mg Oral BID  . multivitamin with minerals  1 tablet Oral Daily  . ramipril  5 mg Oral Daily  . rosuvastatin  10 mg Oral QPM  . sodium chloride flush  3 mL Intravenous Q12H  . tamsulosin  0.4 mg Oral QPM   Continuous Infusions: .  sodium chloride       LOS: 1 day   Geradine Girt  [facility]   How to contact the [facility] Attending or Consulting provider [place] or covering provider during after hours [place], for this patient?  1. Check the care team in [facility] and look for a) attending/consulting [facility] provider listed and b) the [facility] team listed 2. Log into [email or web address] and use [place]'s universal password to access. If you do not have the password, please contact the hospital operator. 3. Locate the [facility] provider you are looking for under [facility] and page to a number that you can be directly reached. 4. If you still have difficulty reaching the provider, please page the [facility] (Director on Call) for the Hospitalists listed on amion for assistance.  09/25/2019, 10:52 AM

## 2019-09-25 NOTE — Progress Notes (Signed)
Copy of patient AORTIC VALVE IMPLANT CARD placed on shadow chart.

## 2019-09-25 NOTE — Progress Notes (Signed)
   Received message this evening from Dr Harl Bowie - he states that the team during the week scheduled this patient for a cath tomorrow. He said we need to cancel this since GI needs to scope him first. I don't have the ability to cancel this for myself since cath lab is closed on the weekend, but have sent the message to our team coming in early tomorrow morning to be aware to cancel.  Arnelle Nale PA-C

## 2019-09-25 NOTE — H&P (View-Only) (Signed)
[facility] Consultation Note  Referring Provider: [facility] Primary Care Physician:  Crist Infante, MD Primary Gastroenterologist:  Dr. Laurence Spates  Reason for Consultation:  hematochezia  HPI: Andre Miranda is a 83 y.o. male admitted for weakness, shortness of breath and hematochezia.  Found to be in heart failure, has significantly improved with diuretics.  He has had some hard pellet like stools with some straining, as well as bright red hematochezia with large blood clots, other the past few days.  None today.  Has history of colonic polyps and last colonoscopy 2014 with Dr. Oletta Lamas.  Some mild GERD.  No abdominal pain, hematemesis, melena.  On baby ASA at home, no other blood thinners.   Past Medical History:  Diagnosis Date  . Aortic stenosis    MODERATE  . Cancer [facility])    prostate cancer-radiation  . Carotid arterial disease ([place])   . Coronary artery disease   . Dysuria   . GERD (gastroesophageal reflux disease)   . Gout   . Hematuria   . History of urinary retention   . Hyperlipidemia   . Hypertension   . Obesity   . Sleep apnea    uses CPAP nightly    Past Surgical History:  Procedure Laterality Date  . AORTIC VALVE REPAIR  February 06, 2015   Dr. Aline Brochure and Dr. Ysidro Evert  at [facility]  . [place]  02/2008  . CHOLECYSTECTOMY  04/2002  . CORONARY ARTERY BYPASS GRAFT  2003   LIMA TO THE LAD, RIMA TO THE RCA, AND A SAPHENOUS VEIN GRAFT TO THE INTERMEDIATE AND DISTAL LEFT CIRCUMFLEX  . ORIF ANKLE FRACTURE Right 02/14/2019   Procedure: OPEN REDUCTION INTERNAL FIXATION (ORIF) ANKLE FRACTURE;  Surgeon: Netta Cedars, MD;  Location: [facility];  Service: Orthopedics;  Laterality: Right;  . US ECHOCARDIOGRAPHY  08/2009   SHOWED A MEAN GRADIENT ACROSS IS AORTIC VALVE OF 24 MM OF MERCURY. HE HAD MODERATE LVH AND NORMAL LV FUNCTION    Prior to Admission medications   Medication Sig Start Date End Date Taking? Authorizing  Provider  allopurinol (ZYLOPRIM) 300 MG tablet Take 300 mg by mouth daily.     Yes [provider]  amLODipine (NORVASC) 10 MG tablet TAKE 1 TABLET BY MOUTH ONCE DAILY Patient taking differently: Take 10 mg by mouth daily.  12/29/18  Yes Belva Crome, MD  amoxicillin (AMOXIL) 500 MG capsule Take 2,000 mg by mouth See admin instructions. Take 2,000 mg by mouth one hour prior to dental appointments   Yes [provider]  aspirin EC 81 MG tablet Take 81 mg by mouth at bedtime.  11/23/12  Yes Larey Dresser, MD  Cholecalciferol (VITAMIN D3) 1000 UNITS CAPS Take 1,000 Units by mouth at bedtime.    Yes [provider]  famotidine (PEPCID) 20 MG tablet Take 20 mg by mouth daily before breakfast.    Yes [provider]  metoprolol tartrate (LOPRESSOR) 25 MG tablet Take 1 tablet (25 mg total) by mouth 2 (two) times daily. 09/06/19 12/05/19 Yes Burtis Junes, NP  Multiple Vitamins-Minerals (CENTRUM SILVER) tablet Take 1 tablet by mouth daily with breakfast.    Yes [provider]  PRESCRIPTION MEDICATION CPAP: At bedtime   Yes [provider]  ramipril (ALTACE) 5 MG capsule Take 1 capsule (5 mg total) by mouth daily. 09/06/19 12/05/19 Yes Burtis Junes, NP  rosuvastatin (CRESTOR) 20 MG tablet Take 10 mg by mouth every evening.  Yes [provider]  Tamsulosin HCl (FLOMAX) 0.4 MG CAPS Take 0.4 mg by mouth every evening.    Yes [provider]  temazepam (RESTORIL) 30 MG capsule Take 30 mg by mouth at bedtime as needed for sleep.   Yes [provider]  levothyroxine (SYNTHROID) 25 MCG tablet Take 1 tablet (25 mcg total) by mouth daily before breakfast. Patient not taking: Reported on 09/23/2019 09/20/19   Burtis Junes, NP    Current Facility-Administered Medications  Medication Dose Route Frequency Provider Last Rate Last Dose  . 0.9 %  sodium chloride infusion  250 mL Intravenous PRN Elwyn Reach, MD      .  acetaminophen (TYLENOL) tablet 650 mg  650 mg Oral Q4H PRN Gala Romney L, MD      . allopurinol (ZYLOPRIM) tablet 300 mg  300 mg Oral Daily Elwyn Reach, MD   300 mg at 09/25/19 0927  . amLODipine (NORVASC) tablet 10 mg  10 mg Oral Daily Elwyn Reach, MD   10 mg at 09/25/19 0928  . aspirin EC tablet 81 mg  81 mg Oral QHS Elwyn Reach, MD   81 mg at 09/24/19 2106  . cholecalciferol (VITAMIN D3) tablet 1,000 Units  1,000 Units Oral QHS Elwyn Reach, MD   1,000 Units at 09/24/19 2106  . enoxaparin (LOVENOX) injection 40 mg  40 mg Subcutaneous Q24H Gala Romney L, MD   40 mg at 09/25/19 0929  . famotidine (PEPCID) tablet 20 mg  20 mg Oral Daily Elwyn Reach, MD   20 mg at 09/25/19 0926  . metoprolol tartrate (LOPRESSOR) tablet 25 mg  25 mg Oral BID Elwyn Reach, MD   25 mg at 09/25/19 0929  . multivitamin with minerals tablet 1 tablet  1 tablet Oral Daily Elwyn Reach, MD   1 tablet at 09/25/19 0926  . ondansetron (ZOFRAN) injection 4 mg  4 mg Intravenous Q6H PRN Gala Romney L, MD      . ramipril (ALTACE) capsule 5 mg  5 mg Oral Daily Vann, Jessica U, DO   5 mg at 09/25/19 0928  . rosuvastatin (CRESTOR) tablet 10 mg  10 mg Oral QPM Gala Romney L, MD   10 mg at 09/24/19 1707  . sodium chloride flush (NS) 0.9 % injection 3 mL  3 mL Intravenous Q12H Gala Romney L, MD   3 mL at 09/25/19 1146  . sodium chloride flush (NS) 0.9 % injection 3 mL  3 mL Intravenous PRN Gala Romney L, MD   3 mL at 09/24/19 1710  . tamsulosin (FLOMAX) capsule 0.4 mg  0.4 mg Oral QPM Gala Romney L, MD   0.4 mg at 09/24/19 1707  . temazepam (RESTORIL) capsule 30 mg  30 mg Oral QHS PRN Elwyn Reach, MD        Allergies as of 09/23/2019 - Review Complete 09/23/2019  Allergen Reaction Noted  . Ibuprofen Hypertension 04/03/2011    Family History  Problem Relation Age of Onset  . Heart attack Father   . Pneumonia Mother 31    Social History   Socioeconomic  History  . Marital status: Married    Spouse name: Not on file  . Number of children: Not on file  . Years of education: Not on file  . Highest education level: Not on file  Occupational History  . Not on file  Social Needs  . Financial resource strain: Not on file  . Food  insecurity    Worry: Not on file    Inability: Not on file  . Transportation needs    Medical: Not on file    Non-medical: Not on file  Tobacco Use  . Smoking status: Former Smoker    Packs/day: 1.50    Years: 7.00    Pack years: 10.50    Types: Cigarettes    Quit date: 04/03/1971    Years since quitting: 48.5  . Smokeless tobacco: Never Used  Substance and Sexual Activity  . Alcohol use: Yes    Comment: social  . Drug use: No  . Sexual activity: Not on file  Lifestyle  . Physical activity    Days per week: Not on file    Minutes per session: Not on file  . Stress: Not on file  Relationships  . Social Herbalist on phone: Not on file    Gets together: Not on file    Attends religious service: Not on file    Active member of club or organization: Not on file    Attends meetings of clubs or organizations: Not on file    Relationship status: Not on file  . Intimate partner violence    Fear of current or ex partner: Not on file    Emotionally abused: Not on file    Physically abused: Not on file    Forced sexual activity: Not on file  Other Topics Concern  . Not on file  Social History Narrative  . Not on file    Review of Systems: As per HPI, all others negative  Physical Exam: Vital signs in last 24 hours: Temp:  [97.7 F (36.5 C)-98.2 F (36.8 C)] 97.7 F (36.5 C) (11/08 0358) Pulse Rate:  [66-71] 71 (11/08 0926) Resp:  [16-19] 18 (11/08 0358) BP: (128-135)/(44-49) 130/45 (11/08 0926) SpO2:  [95 %-96 %] 96 % (11/08 0358) Weight:  [102.6 kg] 102.6 kg (11/08 0358) Last BM Date: 09/23/19 General:   Alert,  Well-developed, well-nourished, pleasant and cooperative in NAD Head:   Normocephalic and atraumatic. Eyes:  Sclera clear, no icterus.   Conjunctiva pink. Ears:  Normal auditory acuity. Nose:  No deformity, discharge,  or lesions. Mouth:  No deformity or lesions.  Oropharynx pink & moist. Abdomen:  Soft, protuberant, nontender and nondistended. No masses, hepatosplenomegaly or hernias noted. No guarding, and without rebound.     Msk:  Symmetrical without gross deformities. Normal posture. Pulses:  Normal pulses noted. Extremities: 1+ edema bilateral lower extremities to mid-shin Neurologic:  Alert and  oriented x4;  grossly normal neurologically. Skin:  Scattered ecchymoses, otherwise intact without significant lesions or rashes. Psych:  Alert and cooperative. Normal mood and affect.   Lab Results: Recent Labs    09/23/19 2136 09/24/19 0327 09/25/19 0353  WBC 7.7 7.6 7.8  HGB 13.2 12.9* 12.4*  HCT 40.4 38.9* 38.3*  PLT 115* 110* 104*   BMET Recent Labs    09/23/19 1600 09/23/19 2136 09/24/19 0327 09/25/19 0353  NA 134*  --  137 137  K 4.5  --  4.1 3.9  CL 102  --  107 103  CO2 21*  --  21* 25  GLUCOSE 112*  --  102* 101*  BUN 20  --  19 24*  CREATININE 1.09 1.17 1.17 1.27*  CALCIUM 9.3  --  9.2 9.3   LFT No results for input(s): PROT, ALBUMIN, AST, ALT, ALKPHOS, BILITOT, BILIDIR, IBILI in the last 72 hours. PT/INR No results  for input(s): LABPROT, INR in the last 72 hours.  Studies/Results: Dg Chest Portable 1 View  Result Date: 09/23/2019 CLINICAL DATA:  83 year old male with a history of dyspnea and leg swelling EXAM: PORTABLE CHEST 1 VIEW COMPARISON:  09/10/2019 FINDINGS: Cardiomediastinal silhouette unchanged in size and contour. Surgical changes of median sternotomy. Cardiac event recorder projects over the left chest. Mixed reticulonodular opacities of the bilateral lung bases with obscuration of the hemidiaphragm bilaterally. No pneumothorax.  No significant interlobular septal thickening. IMPRESSION: Persisting reticulonodular  opacities at the lung bases with new small pleural effusions, potentially multifocal infection and/or edema. Electronically Signed   By: Corrie Mckusick D.O.   On: 09/23/2019 16:03    Impression:  1.  Fatigue, weakness, shortness of breath.  Likely multifactorial (anemia and heart failure).  Lots of diuresis overnight and patient feels much better. 2.  Intermittent chest pressure, thought stable angina by cardiology, outpatient catheterization next week had been planned. 3.  Hematochezia with clots.  History of colon polyps.  Last colonoscopy 2014 with Dr. Oletta Lamas 4.  Thrombocytopenia, 100-120s.  Reviewing old labs, this is chronic and dates back to at least 2009.  Unclear etiology.  No recent liver imaging so far as I'Miranda aware.  Plan:  1.  Given patient's history of polyps and his hematochezia as well as his mild thrombocytopenia, and since his heart catheterization doesn't seem urgent, I have advised colonoscopy +/- endoscopy to be done pre-catheterization.  We have arranged these procedures for tomorrow 1130 am with Dr. Alessandra Bevels. 2.  Risks (bleeding, infection, bowel perforation that could require surgery, sedation-related changes in cardiopulmonary systems), benefits (identification and possible treatment of source of symptoms, exclusion of certain causes of symptoms), and alternatives (watchful waiting, radiographic imaging studies, empiric medical treatment) of upper endoscopy (EGD) were explained to patient/family in detail and patient wishes to proceed. 3.  Risks (bleeding, infection, bowel perforation that could require surgery, sedation-related changes in cardiopulmonary systems), benefits (identification and possible treatment of source of symptoms, exclusion of certain causes of symptoms), and alternatives (watchful waiting, radiographic imaging studies, empiric medical treatment) of colonoscopy were explained to patient/family in detail and patient wishes to proceed. 4.  Next step in  management pending colonoscopy +/- endoscopy findings.   LOS: 1 day   Andre Miranda,Andre Miranda  09/25/2019, 3:33 PM  Cell [phone] If no answer or after 5 PM call [phone]

## 2019-09-26 ENCOUNTER — Encounter (HOSPITAL_COMMUNITY)
Admission: EM | Disposition: A | Discharge status: Home / Self Care | Payer: Self-pay | Source: Home / Self Care | Attending: Internal Medicine

## 2019-09-26 ENCOUNTER — Ambulatory Visit: Payer: Medicare Other | Admitting: Nurse Practitioner

## 2019-09-26 ENCOUNTER — Inpatient Hospital Stay (HOSPITAL_COMMUNITY): Payer: Medicare Other | Admitting: Certified Registered"

## 2019-09-26 ENCOUNTER — Encounter (HOSPITAL_COMMUNITY): Payer: Self-pay | Admitting: *Deleted

## 2019-09-26 DIAGNOSIS — R778 Other specified abnormalities of plasma proteins: Secondary | ICD-10-CM

## 2019-09-26 DIAGNOSIS — I25118 Atherosclerotic heart disease of native coronary artery with other forms of angina pectoris: Principal | ICD-10-CM

## 2019-09-26 DIAGNOSIS — Z951 Presence of aortocoronary bypass graft: Secondary | ICD-10-CM

## 2019-09-26 DIAGNOSIS — I509 Heart failure, unspecified: Secondary | ICD-10-CM

## 2019-09-26 DIAGNOSIS — I5033 Acute on chronic diastolic (congestive) heart failure: Secondary | ICD-10-CM

## 2019-09-26 DIAGNOSIS — Z952 Presence of prosthetic heart valve: Secondary | ICD-10-CM

## 2019-09-26 DIAGNOSIS — K921 Melena: Secondary | ICD-10-CM

## 2019-09-26 HISTORY — PX: POLYPECTOMY: SHX5525

## 2019-09-26 HISTORY — PX: BIOPSY: SHX5522

## 2019-09-26 HISTORY — PX: COLONOSCOPY WITH PROPOFOL: SHX5780

## 2019-09-26 HISTORY — PX: ESOPHAGOGASTRODUODENOSCOPY (EGD) WITH PROPOFOL: SHX5813

## 2019-09-26 LAB — BASIC METABOLIC PANEL
Anion gap: 13 (ref 5–15)
BUN: 24 mg/dL — ABNORMAL HIGH (ref 8–23)
CO2: 21 mmol/L — ABNORMAL LOW (ref 22–32)
Calcium: 9.2 mg/dL (ref 8.9–10.3)
Chloride: 103 mmol/L (ref 98–111)
Creatinine, Ser: 1.28 mg/dL — ABNORMAL HIGH (ref 0.61–1.24)
GFR calc Af Amer: >60 mL/min (ref >60)
GFR calc non Af Amer: 52 mL/min — ABNORMAL LOW (ref >60)
Glucose, Bld: 100 mg/dL — ABNORMAL HIGH (ref 70–99)
Potassium: 3.3 mmol/L — ABNORMAL LOW (ref 3.5–5.1)
Sodium: 137 mmol/L (ref 135–145)

## 2019-09-26 LAB — CBC
HCT: 38 % — ABNORMAL LOW (ref 39.0–52.0)
Hemoglobin: 12.3 g/dL — ABNORMAL LOW (ref 13.0–17.0)
MCH: 30.3 pg (ref 26.0–34.0)
MCHC: 32.4 g/dL (ref 30.0–36.0)
MCV: 93.6 fL (ref 80.0–100.0)
Platelets: 107 10*3/uL — ABNORMAL LOW (ref 150–400)
RBC: 4.06 MIL/uL — ABNORMAL LOW (ref 4.22–5.81)
RDW: 14.5 % (ref 11.5–15.5)
WBC: 8.4 10*3/uL (ref 4.0–10.5)
nRBC: 0 % (ref 0.0–0.2)

## 2019-09-26 SURGERY — COLONOSCOPY WITH PROPOFOL
Anesthesia: Monitor Anesthesia Care | Laterality: Left

## 2019-09-26 SURGERY — LEFT HEART CATH AND CORS/GRAFTS ANGIOGRAPHY
Anesthesia: LOCAL

## 2019-09-26 MED ORDER — HYDROCORTISONE ACETATE 25 MG RE SUPP
25.0000 mg | Freq: Every day | RECTAL | Status: DC
  Administered 2019-09-26: 25 mg via RECTAL
  Filled 2019-09-26 (×3): qty 1

## 2019-09-26 MED ORDER — PHENYLEPHRINE HCL-NACL 10-0.9 MG/250ML-% IV SOLN
INTRAVENOUS | Status: DC | PRN
  Administered 2019-09-26: 50 ug/min via INTRAVENOUS

## 2019-09-26 MED ORDER — LIDOCAINE HCL (CARDIAC) PF 100 MG/5ML IV SOSY
PREFILLED_SYRINGE | INTRAVENOUS | Status: DC | PRN
  Administered 2019-09-26: 20 mg via INTRATRACHEAL

## 2019-09-26 MED ORDER — LACTATED RINGERS IV SOLN
INTRAVENOUS | Status: DC | PRN
  Administered 2019-09-26: 11:00:00 via INTRAVENOUS

## 2019-09-26 MED ORDER — PANTOPRAZOLE SODIUM 40 MG PO TBEC
40.0000 mg | DELAYED_RELEASE_TABLET | Freq: Every day | ORAL | Status: DC
  Administered 2019-09-26 – 2019-09-27 (×2): 40 mg via ORAL
  Filled 2019-09-26 (×2): qty 1

## 2019-09-26 MED ORDER — PROPOFOL 500 MG/50ML IV EMUL
INTRAVENOUS | Status: DC | PRN
  Administered 2019-09-26: 70 ug/kg/min via INTRAVENOUS

## 2019-09-26 MED ORDER — EPHEDRINE SULFATE 50 MG/ML IJ SOLN
INTRAMUSCULAR | Status: DC | PRN
  Administered 2019-09-26 (×2): 10 mg via INTRAVENOUS
  Administered 2019-09-26: 5 mg via INTRAVENOUS

## 2019-09-26 SURGICAL SUPPLY — 25 items

## 2019-09-26 NOTE — Op Note (Signed)
[facility] Patient Name: Andre Miranda Procedure Date : 09/26/2019 MRN: [id number] Attending MD: Otis Brace , MD Date of Birth: 04-Dec-1935 CSN: [id number] Age: 83 Admit Type: Inpatient Procedure:                Upper GI endoscopy Indications:              Hematochezia Providers:                Otis Brace, MD, Carmie End, RN, Cletis Athens, Technician Referring MD:              Medicines:                Sedation Administered by an Anesthesia Professional Complications:            No immediate complications. Estimated Blood Loss:     Estimated blood loss was minimal. Procedure:                Pre-Anesthesia Assessment:                           - Prior to the procedure, a History and Physical                            was performed, and patient medications and                            allergies were reviewed. The patient's tolerance of                            previous anesthesia was also reviewed. The risks                            and benefits of the procedure and the sedation                            options and risks were discussed with the patient.                            All questions were answered, and informed consent                            was obtained. Prior Anticoagulants: The patient has                            taken no previous anticoagulant or antiplatelet                            agents except for aspirin. ASA Grade Assessment: IV                            - A patient with severe systemic disease that is a  constant threat to life. After reviewing the risks                            and benefits, the patient was deemed in                            satisfactory condition to undergo the procedure.                           After obtaining informed consent, the endoscope was                            passed under direct vision. Throughout the       procedure, the patient's blood pressure, pulse, and                            oxygen saturations were monitored continuously. The                            [id number]-H190 [id number]) Olympus gastroscope was                            introduced through the mouth, and advanced to the                            second part of duodenum. The upper GI endoscopy was                            accomplished without difficulty. The patient                            tolerated the procedure well. Scope In: Scope Out: Findings:      The Z-line was regular and was found 39 cm from the incisors.      No gross lesions were noted in the entire esophagus.      Scattered moderate mucosal changes characterized by congestion, erythema       and inflammationand thick folds were found in the gastric fundus.       Biopsies were taken with a cold forceps for histology.      The exam of the stomach was otherwise normal.      Scattered mild inflammation characterized by congestion (edema),       erosions and erythema was found in the duodenal bulb.      The first portion of the duodenum and second portion of the duodenum       were normal. Impression:               - Z-line regular, 39 cm from the incisors.                           - No gross lesions in esophagus.                           - Congestion, erythema and inflammationand thick  folds mucosa in the gastric fundus. Biopsied.                           - Duodenitis.                           - Normal first portion of the duodenum and second                            portion of the duodenum. Recommendation:           - Perform a colonoscopy today. Procedure Code(s):        --- Professional ---                           [phone], Esophagogastroduodenoscopy, flexible,                            transoral; with biopsy, single or multiple Diagnosis Code(s):        --- Professional ---                           K29.80, Duodenitis  without bleeding                           K92.1, Melena (includes Hematochezia) CPT copyright 2019 [employer]. All rights reserved. The codes documented in this report are preliminary and upon coder review may  be revised to meet current compliance requirements. Otis Brace, MD Otis Brace, MD 09/26/2019 12:38:38 PM Number of Addenda: 0

## 2019-09-26 NOTE — Interval H&P Note (Signed)
History and Physical Interval Note:  09/26/2019 11:11 AM  Andre Miranda  has presented today for surgery, with the diagnosis of blood in stool.  The various methods of treatment have been discussed with the patient and family. After consideration of risks, benefits and other options for treatment, the patient has consented to  Procedure(s): COLONOSCOPY WITH PROPOFOL (Left) ESOPHAGOGASTRODUODENOSCOPY (EGD) WITH PROPOFOL (Left) as a surgical intervention.  The patient's history has been reviewed, patient examined, no change in status, stable for surgery.  I have reviewed the patient's chart and labs.  Questions were answered to the patient's satisfaction.     Bob Daversa

## 2019-09-26 NOTE — Op Note (Signed)
[facility] Patient Name: Andre Miranda Procedure Date : 09/26/2019 MRN: [id number] Attending MD: Otis Brace , MD Date of Birth: [birth year] CSN: [id number] Age: 83 Admit Type: Inpatient Procedure:                Colonoscopy Indications:              Last colonoscopy: 2014, Hematochezia Providers:                Otis Brace, MD, Carmie End, RN, Cletis Athens, Technician Referring MD:              Medicines:                Sedation Administered by an Anesthesia Professional Complications:            No immediate complications. Estimated Blood Loss:     Estimated blood loss was minimal. Procedure:                Pre-Anesthesia Assessment:                           - Prior to the procedure, a History and Physical                            was performed, and patient medications and                            allergies were reviewed. The patient's tolerance of                            previous anesthesia was also reviewed. The risks                            and benefits of the procedure and the sedation                            options and risks were discussed with the patient.                            All questions were answered, and informed consent                            was obtained. Prior Anticoagulants: The patient has                            taken no previous anticoagulant or antiplatelet                            agents except for aspirin. ASA Grade Assessment: IV                            - A patient with severe systemic disease that is a  constant threat to life. After reviewing the risks                            and benefits, the patient was deemed in                            satisfactory condition to undergo the procedure.                           - Prior to the procedure, a History and Physical                            was performed, and patient medications and                     allergies were reviewed. The patient's tolerance of                            previous anesthesia was also reviewed. The risks                            and benefits of the procedure and the sedation                            options and risks were discussed with the patient.                            All questions were answered, and informed consent                            was obtained. Prior Anticoagulants: The patient has                            taken no previous anticoagulant or antiplatelet                            agents except for aspirin. ASA Grade Assessment: IV                            - A patient with severe systemic disease that is a                            constant threat to life. After reviewing the risks                            and benefits, the patient was deemed in                            satisfactory condition to undergo the procedure.                           After obtaining informed consent, the colonoscope  was passed under direct vision. Throughout the                            procedure, the patient's blood pressure, pulse, and                            oxygen saturations were monitored continuously. The                            [id number]) Olympus pediatric colonscope                            was introduced through the anus and advanced to the                            the cecum, identified by appendiceal orifice and                            ileocecal valve. The colonoscopy was performed                            without difficulty. The patient tolerated the                            procedure well. The quality of the bowel                            preparation was fair. Scope In: 11:57:16 AM Scope Out: 12:28:21 PM Scope Withdrawal Time: 0 hours 21 minutes 4 seconds  Total Procedure Duration: 0 hours 31 minutes 5 seconds  Findings:      Skin tags were found on perianal exam.       Hemorrhoids were found on perianal exam.      A moderate amount of liquid semi-liquid stool was found in the entire       colon, interfering with visualization. Lavage of the area was performed,       resulting in clearance with fair visualization.      A 7 mm polyp was found in the ascending colon. The polyp was sessile.       The polyp was removed with a cold snare. Resection and retrieval were       complete.      A 8 mm polyp was found in the transverse colon. The polyp was sessile.       The polyp was removed with a cold snare. Resection and retrieval were       complete.      A 8 mm polyp was found in the sigmoid colon. The polyp was sessile. The       polyp was removed with a cold snare. Resection and retrieval were       complete.      Multiple large-mouthed diverticula were found in the entire colon.      A scattered area of moderately erythematous, hemorrhagic and inflamed       mucosa was found in the rectum. Biopsies were taken with a cold forceps       for histology.      Internal hemorrhoids were found  during retroflexion. The hemorrhoids       were medium-sized. Impression:               - Preparation of the colon was fair.                           - Perianal skin tags found on perianal exam.                           - Hemorrhoids found on perianal exam.                           - Stool in the entire examined colon.                           - One 7 mm polyp in the ascending colon, removed                            with a cold snare. Resected and retrieved.                           - One 8 mm polyp in the transverse colon, removed                            with a cold snare. Resected and retrieved.                           - One 8 mm polyp in the sigmoid colon, removed with                            a cold snare. Resected and retrieved.                           - Diverticulosis in the entire examined colon.                           - Erythematous, hemorrhagic  and inflamed mucosa in                            the rectum. Biopsied.                           - Internal hemorrhoids. Recommendation:           - Return patient to hospital ward for ongoing care.                           - Cardiac diet.                           - Continue present medications.                           - Await pathology results.                           -  Repeat colonoscopy date to be determined after                            pending pathology results are reviewed for                            surveillance based on pathology results. Procedure Code(s):        --- Professional ---                           [phone], Colonoscopy, flexible; with removal of                            tumor(s), polyp(s), or other lesion(s) by snare                            technique                           45380, 49, Colonoscopy, flexible; with biopsy,                            single or multiple Diagnosis Code(s):        --- Professional ---                           K64.8, Other hemorrhoids                           K63.5, Polyp of colon                           K62.5, Hemorrhage of anus and rectum                           K62.89, Other specified diseases of anus and rectum                           K64.4, Residual hemorrhoidal skin tags                           K92.1, Melena (includes Hematochezia)                           K57.30, Diverticulosis of large intestine without                            perforation or abscess without bleeding CPT copyright 2019 American [employer] Association. All rights reserved. The codes documented in this report are preliminary and upon coder review may  be revised to meet current compliance requirements. Otis Brace, MD Otis Brace, MD 09/26/2019 12:46:09 PM Number of Addenda: 0

## 2019-09-26 NOTE — Transfer of Care (Signed)
Immediate Anesthesia Transfer of Care Note  Patient: Andre Miranda  Procedure(s) Performed: COLONOSCOPY WITH PROPOFOL (Left ) ESOPHAGOGASTRODUODENOSCOPY (EGD) WITH PROPOFOL (Left ) BIOPSY POLYPECTOMY  Patient Location: Endoscopy Unit  Anesthesia Type:MAC  Level of Consciousness: awake, alert  and oriented  Airway & Oxygen Therapy: Patient connected to nasal cannula oxygen  Post-op Assessment: Post -op Vital signs reviewed and stable  Post vital signs: stable  Last Vitals:  Vitals Value Taken Time  BP    Temp    Pulse    Resp    SpO2      Last Pain:  Vitals:   09/26/19 1109  TempSrc: Oral  PainSc: 0-No pain      Patients Stated Pain Goal: 0 (86/75/44 9201)  Complications: No apparent anesthesia complications

## 2019-09-26 NOTE — H&P (View-Only) (Signed)
Progress Note  Patient Name: Andre Miranda Date of Encounter: 09/26/2019  Primary Cardiologist: Sinclair Grooms, MD   Subjective   Seen post upper and lower GI endoscopy. I personally reviewed the reports. Patient is feeling well after. Per his wife, he was told that he is ok for cath from GI perspective. He denies any additional blood in his stool. We discussed options for cath, and he wishes to proceed with previously scheduled with Dr. Tamala Julian tomorrow.   Inpatient Medications    Scheduled Meds: . allopurinol  300 mg Oral Daily  . amLODipine  10 mg Oral Daily  . aspirin EC  81 mg Oral QHS  . cholecalciferol  1,000 Units Oral QHS  . famotidine  20 mg Oral Daily  . metoprolol tartrate  25 mg Oral BID  . multivitamin with minerals  1 tablet Oral Daily  . pantoprazole  40 mg Oral Daily  . ramipril  5 mg Oral Daily  . rosuvastatin  10 mg Oral QPM  . sodium chloride flush  3 mL Intravenous Q12H  . tamsulosin  0.4 mg Oral QPM   Continuous Infusions: . sodium chloride     PRN Meds: sodium chloride, acetaminophen, ondansetron (ZOFRAN) IV, sodium chloride flush, temazepam   Vital Signs    Vitals:   09/26/19 1243 09/26/19 1250 09/26/19 1300 09/26/19 1310  BP: (!) 103/52 (!) 113/30 (!) 112/32 (!) 107/38  Pulse:  65 66 66  Resp:  15 13 15   Temp:      TempSrc:      SpO2:  95%    Weight:      Height:        Intake/Output Summary (Last 24 hours) at 09/26/2019 1437 Last data filed at 09/26/2019 1205 Gross per 24 hour  Intake 420 ml  Output 450 ml  Net -30 ml   Last 3 Weights 09/26/2019 09/25/2019 09/24/2019  Weight (lbs) 223 lb 9.6 oz 226 lb 3.2 oz 233 lb 0.4 oz  Weight (kg) 101.424 kg 102.604 kg 105.7 kg      Telemetry    SR - Personally Reviewed  ECG    09/23/19 SR, RBBB - Personally Reviewed  Physical Exam   GEN: No acute distress.   Neck: No JVD Cardiac: RRR, no rubs, or gallops. 2/6 SEM. Respiratory: Clear to auscultation bilaterally. GI: Soft,  nontender, non-distended  MS: No edema; No deformity. Neuro:  Nonfocal  Psych: Normal affect   Labs    High Sensitivity Troponin:   Recent Labs  Lab 09/10/19 2201 09/10/19 2350 09/11/19 0132 09/23/19 1600 09/23/19 1742  TROPONINIHS 64* 58* 59* 150* 133*      Chemistry Recent Labs  Lab 09/24/19 0327 09/25/19 0353 09/26/19 0416  NA 137 137 137  K 4.1 3.9 3.3*  CL 107 103 103  CO2 21* 25 21*  GLUCOSE 102* 101* 100*  BUN 19 24* 24*  CREATININE 1.17 1.27* 1.28*  CALCIUM 9.2 9.3 9.2  GFRNONAA 58* 53* 52*  GFRAA >60 >60 >60  ANIONGAP 9 9 13      Hematology Recent Labs  Lab 09/24/19 0327 09/25/19 0353 09/26/19 0416  WBC 7.6 7.8 8.4  RBC 4.21* 4.06* 4.06*  HGB 12.9* 12.4* 12.3*  HCT 38.9* 38.3* 38.0*  MCV 92.4 94.3 93.6  MCH 30.6 30.5 30.3  MCHC 33.2 32.4 32.4  RDW 14.4 14.6 14.5  PLT 110* 104* 107*    BNP Recent Labs  Lab 09/23/19 1600  BNP 581.7*  DDimer No results for input(s): DDIMER in the last 168 hours.   Radiology    No results found.  Cardiac Studies   No new this admission  Patient Profile     83 y.o. male with hx of CAD with prior CABG in 2003, [facility], OSA,hyperlipidemia,difficult to controlHTN, and chronic diastolic HFwho is being followed for near syncope, chest tightness, elevated troponin at the request of Dr. Starla Link. Complicated by episode of hematochezia, now s/p upper and lower GI endoscopy.  Assessment & Plan    Angina, stable, with known CAD s/p CABG -conduction system disease limits uptitration of metoprolol -on amlodipine as well -previously scheduled for outpatient cath with Dr. Tamala Julian tomorrow. Discussed. No prohibitive findings on endoscopy, no further events, Hgb stable -continue aspirin -mildly elevated troponin, thought to be more 2/2 heart failure and demand vs. Type I MI. -continue rosuvastatin 20 mg  Risks and benefits of cardiac catheterization have been  discussed with the patient.  These include bleeding, infection, kidney damage, stroke, heart attack, death.  The patient understands these risks and is willing to proceed.  Near syncope: in the setting of conduction system disease -tolerating metoprolol, but concern for block as cause of near syncope vs. Other causes -monitor on telemetry  Acute on chronic diastolic heart failure: appears euvolemic today. Continue to monitor.  Hypertension: continue amlodipine, ramipril, metoprolol  Prior TAVR: continue aspirin  NPO at midnight for cath with Dr. Tamala Julian tomorrow  For questions or updates, please contact [place] Please consult [email or web address] for contact info under     Signed, Buford Dresser, MD  09/26/2019, 2:37 PM

## 2019-09-26 NOTE — Progress Notes (Signed)
Patient ID: Andre Miranda, male   DOB: 01/24/1936, 83 y.o.   MRN: [id number]  PROGRESS NOTE    Andre Miranda  [id number] DOB: October 25, 1936 DOA: 09/23/2019 PCP: Crist Infante, MD   Brief Narrative:  83 year old male with history of aortic valve disease status post TAVR, CAD, hypertension, diastolic dysfunction, GERD and hyperlipidemia who was previously on diuretics but discontinued due to relative hypotension presented with shortness of breath and worsening lower extremity edema.  He was admitted with fluid overload secondary to CHF exacerbation.  Cardiology was consulted.  He also was found to have hematochezia for which GI was also consulted.  Assessment & Plan:  Acute on chronic diastolic heart failure -Echo in 08/2019 showed EF of 60 to 65% -Treated with intravenous Lasix which was discontinued on 09/25/2019 because of uptrending creatinine.  Follow further cardiology recommendations -Strict input and output, daily weights.  Fluid restriction.  Negative balance of 5137 cc since admission -Continue metoprolol  Hematochezia -Hemoglobin 12.3 today.  GI following.  Plan for endoscopy procedure today.  Follow recommendations.  Monitor H&H.  Exertional chest tightness with history of CAD and prior CABG -Continue aspirin and statin and beta-blocker.  Cardiology planning for cardiac cath probably tomorrow.  History of recurrent syncope -Was wearing a Zio patch which showed no events  Hypertension -Continue amlodipine, ramipril and metoprolol.  Monitor blood pressure  Thrombocytopenia -Monitor.  GERD -Continue PPI  Obstructive sleep apnea -Continue CPAP  Obesity -Outpatient follow-up   DVT prophylaxis: DC Lovenox because of hematochezia Code Status: Full Family Communication: None at bedside  disposition Plan: Depends on clinical outcome  Consultants: Cardiology/GI  Procedures: None  Antimicrobials: None  Subjective: Patient seen and examined at bedside.   Denies any overnight black or bloody stools.  No overnight chest pain, worsening shortness of breath or fever.  Objective: Vitals:   09/25/19 1601 09/25/19 2035 09/26/19 0504 09/26/19 0955  BP: (!) 113/43 (!) 153/45 (!) 137/42 (!) 142/47  Pulse: 62 64 68   Resp: 16     Temp: 98.1 F (36.7 C) 98.4 F (36.9 C) 98 F (36.7 C)   TempSrc: Oral Oral Oral   SpO2:  97% 98%   Weight:   101.4 kg   Height:        Intake/Output Summary (Last 24 hours) at 09/26/2019 1103 Last data filed at 09/26/2019 0100 Gross per 24 hour  Intake 3 ml  Output 450 ml  Net -447 ml   Filed Weights   09/24/19 0300 09/25/19 0358 09/26/19 0504  Weight: 105.7 kg 102.6 kg 101.4 kg    Examination:  General exam: Appears calm and comfortable  Respiratory system: Bilateral decreased breath sounds at bases with basilar crackles Cardiovascular system: S1 & S2 heard, Rate controlled Gastrointestinal system: Abdomen is nondistended, soft and nontender. Normal bowel sounds heard. Extremities: No cyanosis, clubbing; trace lower extremity edema   Data Reviewed: I have personally reviewed following labs and imaging studies  CBC: Recent Labs  Lab 09/23/19 1600 09/23/19 2136 09/24/19 0327 09/25/19 0353 09/26/19 0416  WBC 8.9 7.7 7.6 7.8 8.4  NEUTROABS 7.1  --  5.7  --   --   HGB 12.9* 13.2 12.9* 12.4* 12.3*  HCT 40.1 40.4 38.9* 38.3* 38.0*  MCV 95.7 93.5 92.4 94.3 93.6  PLT 117* 115* 110* 104* [id number]*   Basic Metabolic Panel: Recent Labs  Lab 09/19/19 1222 09/23/19 1600 09/23/19 2136 09/24/19 0327 09/25/19 0353 09/26/19 0416  NA 137 134*  --  137 137 137  K 4.8 4.5  --  4.1 3.9 3.3*  CL 104 102  --  107 103 103  CO2 21 21*  --  21* 25 21*  GLUCOSE 88 112*  --  102* 101* 100*  BUN 25 20  --  19 24* 24*  CREATININE 1.27 1.09 1.17 1.17 1.27* 1.28*  CALCIUM 9.4 9.3  --  9.2 9.3 9.2   GFR: Estimated Creatinine Clearance: 54 mL/min (A) (by C-G formula based on SCr of 1.28 mg/dL (H)). Liver Function  Tests: No results for input(s): AST, ALT, ALKPHOS, BILITOT, PROT, ALBUMIN in the last 168 hours. No results for input(s): LIPASE, AMYLASE in the last 168 hours. No results for input(s): AMMONIA in the last 168 hours. Coagulation Profile: No results for input(s): INR, PROTIME in the last 168 hours. Cardiac Enzymes: No results for input(s): CKTOTAL, CKMB, CKMBINDEX, TROPONINI in the last 168 hours. BNP (last 3 results) No results for input(s): PROBNP in the last 8760 hours. HbA1C: No results for input(s): HGBA1C in the last 72 hours. CBG: No results for input(s): GLUCAP in the last 168 hours. Lipid Profile: No results for input(s): CHOL, HDL, LDLCALC, TRIG, CHOLHDL, LDLDIRECT in the last 72 hours. Thyroid Function Tests: No results for input(s): TSH, T4TOTAL, FREET4, T3FREE, THYROIDAB in the last 72 hours. Anemia Panel: No results for input(s): VITAMINB12, FOLATE, FERRITIN, TIBC, IRON, RETICCTPCT in the last 72 hours. Sepsis Labs: No results for input(s): PROCALCITON, LATICACIDVEN in the last 168 hours.  Recent Results (from the past 240 hour(s))  SARS CORONAVIRUS 2 (TAT 6-24 HRS) Nasopharyngeal Nasopharyngeal Swab     Status: None   Collection Time: 09/23/19  3:44 PM   Specimen: Nasopharyngeal Swab  Result Value Ref Range Status   SARS Coronavirus 2 NEGATIVE NEGATIVE Final    Comment: (NOTE) SARS-CoV-2 target nucleic acids are NOT DETECTED. The SARS-CoV-2 RNA is generally detectable in upper and lower respiratory specimens during the acute phase of infection. Negative results do not preclude SARS-CoV-2 infection, do not rule out co-infections with other pathogens, and should not be used as the sole basis for treatment or other patient management decisions. Negative results must be combined with clinical observations, patient history, and epidemiological information. The expected result is Negative. Fact Sheet for Patients: SugarRoll.be Fact Sheet  for Healthcare Providers: https://www.woods-mathews.com/ This test is not yet approved or cleared by the [place] FDA and  has been authorized for detection and/or diagnosis of SARS-CoV-2 by FDA under an Emergency Use Authorization (EUA). This EUA will remain  in effect (meaning this test can be used) for the duration of the COVID-19 declaration under Section 56 4(b)(1) of the Act, 21 U.S.C. section 360bbb-3(b)(1), unless the authorization is terminated or revoked sooner. Performed at [facility], [place] [address], [place], [place] [address]          Radiology Studies: No results found.      Scheduled Meds: . [MAR Hold] allopurinol  300 mg Oral Daily  . [MAR Hold] amLODipine  10 mg Oral Daily  . [MAR Hold] aspirin EC  81 mg Oral QHS  . [MAR Hold] cholecalciferol  1,000 Units Oral QHS  . [MAR Hold] enoxaparin (LOVENOX) injection  40 mg Subcutaneous Q24H  . [MAR Hold] famotidine  20 mg Oral Daily  . [MAR Hold] metoprolol tartrate  25 mg Oral BID  . [MAR Hold] multivitamin with minerals  1 tablet Oral Daily  . [MAR Hold] ramipril  5 mg Oral Daily  . [facility] Hold]  rosuvastatin  10 mg Oral QPM  . [MAR Hold] sodium chloride flush  3 mL Intravenous Q12H  . [MAR Hold] tamsulosin  0.4 mg Oral QPM   Continuous Infusions: . [MAR Hold] sodium chloride            Aline August, MD [facility] 09/26/2019, 11:03 AM

## 2019-09-26 NOTE — Progress Notes (Signed)
Progress Note  Patient Name: Andre Miranda Date of Encounter: 09/26/2019  Primary Cardiologist: Sinclair Grooms, MD   Subjective   Seen post upper and lower GI endoscopy. I personally reviewed the reports. Patient is feeling well after. Per his wife, he was told that he is ok for cath from GI perspective. He denies any additional blood in his stool. We discussed options for cath, and he wishes to proceed with previously scheduled with Dr. Tamala Julian tomorrow.   Inpatient Medications    Scheduled Meds: . allopurinol  300 mg Oral Daily  . amLODipine  10 mg Oral Daily  . aspirin EC  81 mg Oral QHS  . cholecalciferol  1,000 Units Oral QHS  . famotidine  20 mg Oral Daily  . metoprolol tartrate  25 mg Oral BID  . multivitamin with minerals  1 tablet Oral Daily  . pantoprazole  40 mg Oral Daily  . ramipril  5 mg Oral Daily  . rosuvastatin  10 mg Oral QPM  . sodium chloride flush  3 mL Intravenous Q12H  . tamsulosin  0.4 mg Oral QPM   Continuous Infusions: . sodium chloride     PRN Meds: sodium chloride, acetaminophen, ondansetron (ZOFRAN) IV, sodium chloride flush, temazepam   Vital Signs    Vitals:   09/26/19 1243 09/26/19 1250 09/26/19 1300 09/26/19 1310  BP: (!) 103/52 (!) 113/30 (!) 112/32 (!) 107/38  Pulse:  65 66 66  Resp:  15 13 15   Temp:      TempSrc:      SpO2:  95%    Weight:      Height:        Intake/Output Summary (Last 24 hours) at 09/26/2019 1437 Last data filed at 09/26/2019 1205 Gross per 24 hour  Intake 420 ml  Output 450 ml  Net -30 ml   Last 3 Weights 09/26/2019 09/25/2019 09/24/2019  Weight (lbs) 223 lb 9.6 oz 226 lb 3.2 oz 233 lb 0.4 oz  Weight (kg) 101.424 kg 102.604 kg 105.7 kg      Telemetry    SR - Personally Reviewed  ECG    09/23/19 SR, RBBB - Personally Reviewed  Physical Exam   GEN: No acute distress.   Neck: No JVD Cardiac: RRR, no rubs, or gallops. 2/6 SEM. Respiratory: Clear to auscultation bilaterally. GI: Soft,  nontender, non-distended  MS: No edema; No deformity. Neuro:  Nonfocal  Psych: Normal affect   Labs    High Sensitivity Troponin:   Recent Labs  Lab 09/10/19 2201 09/10/19 2350 09/11/19 0132 09/23/19 1600 09/23/19 1742  TROPONINIHS 64* 58* 59* 150* 133*      Chemistry Recent Labs  Lab 09/24/19 0327 09/25/19 0353 09/26/19 0416  NA 137 137 137  K 4.1 3.9 3.3*  CL 107 103 103  CO2 21* 25 21*  GLUCOSE 102* 101* 100*  BUN 19 24* 24*  CREATININE 1.17 1.27* 1.28*  CALCIUM 9.2 9.3 9.2  GFRNONAA 58* 53* 52*  GFRAA >60 >60 >60  ANIONGAP 9 9 13      Hematology Recent Labs  Lab 09/24/19 0327 09/25/19 0353 09/26/19 0416  WBC 7.6 7.8 8.4  RBC 4.21* 4.06* 4.06*  HGB 12.9* 12.4* 12.3*  HCT 38.9* 38.3* 38.0*  MCV 92.4 94.3 93.6  MCH 30.6 30.5 30.3  MCHC 33.2 32.4 32.4  RDW 14.4 14.6 14.5  PLT 110* 104* 107*    BNP Recent Labs  Lab 09/23/19 1600  BNP 581.7*  DDimer No results for input(s): DDIMER in the last 168 hours.   Radiology    No results found.  Cardiac Studies   No new this admission  Patient Profile     83 y.o. male with hx of CAD with prior CABG in 2003, [facility], OSA,hyperlipidemia,difficult to controlHTN, and chronic diastolic HFwho is being followed for near syncope, chest tightness, elevated troponin at the request of Dr. Starla Link. Complicated by episode of hematochezia, now s/p upper and lower GI endoscopy.  Assessment & Plan    Angina, stable, with known CAD s/p CABG -conduction system disease limits uptitration of metoprolol -on amlodipine as well -previously scheduled for outpatient cath with Dr. Tamala Julian tomorrow. Discussed. No prohibitive findings on endoscopy, no further events, Hgb stable -continue aspirin -mildly elevated troponin, thought to be more 2/2 heart failure and demand vs. Type I MI. -continue rosuvastatin 20 mg  Risks and benefits of cardiac catheterization have been  discussed with the patient.  These include bleeding, infection, kidney damage, stroke, heart attack, death.  The patient understands these risks and is willing to proceed.  Near syncope: in the setting of conduction system disease -tolerating metoprolol, but concern for block as cause of near syncope vs. Other causes -monitor on telemetry  Acute on chronic diastolic heart failure: appears euvolemic today. Continue to monitor.  Hypertension: continue amlodipine, ramipril, metoprolol  Prior TAVR: continue aspirin  NPO at midnight for cath with Dr. Tamala Julian tomorrow  For questions or updates, please contact [place] Please consult [email or web address] for contact info under     Signed, Buford Dresser, MD  09/26/2019, 2:37 PM

## 2019-09-26 NOTE — Anesthesia Preprocedure Evaluation (Addendum)
Anesthesia Evaluation  Patient identified by MRN, date of birth, ID band Patient awake    Reviewed: Allergy & Precautions, NPO status , Patient's Chart, lab work & pertinent test results, reviewed documented beta blocker date and time   Airway Mallampati: II  TM Distance: >3 FB Neck ROM: Full    Dental  (+) Teeth Intact, Dental Advisory Given   Pulmonary shortness of breath, asthma , sleep apnea , former smoker,    breath sounds clear to auscultation       Cardiovascular hypertension, Pt. on medications + angina with exertion + CAD, + CABG and +CHF  + Valvular Problems/Murmurs AS  Rhythm:Regular Rate:Normal  S/P CABG in 2003 and TAVR in 2016 at [facility]  Echo 09/11/2019:  1. Left ventricular ejection fraction, by visual estimation, is 60 to 65%. The left ventricle has normal function. Moderately increased left ventricular size. There is no left ventricular hypertrophy.  2. Left ventricular diastolic Doppler parameters are indeterminate pattern of LV diastolic filling.  3. Global right ventricle has normal systolic function.The right ventricular size is normal. No increase in right ventricular wall thickness.  4. Left atrial size was normal.  5. Right atrial size was normal.  6. Moderate mitral annular calcification. Moderate thickening of the mitral valve leaflet(s). No evidence of mitral valve regurgitation. Mild mitral stenosis.  7. The tricuspid valve is normal in structure. Tricuspid valve regurgitation is trivial.  8. S/p TAVR with 9m Direct Flow [employer] valve valve is present in the aortic position. Procedure Date: 2016. Aortic valve area, by VTI measures 1.69 cm. Aortic valve mean gradient measures 17.2 mmHg. Aortic valve peak gradient measures 27.7 mmHg.  There is moderate perivalvular Aortic valve regurgitation by color flow Doppler.  9. The pulmonic valve was normal in structure. Pulmonic valve regurgitation is trivial by color  flow Doppler. 10. The inferior vena cava is normal in size with greater than 50% respiratory variability, suggesting right atrial pressure of 3 mmHg.   Neuro/Psych    GI/Hepatic GERD  ,  Endo/Other    Renal/GU Renal disease     Musculoskeletal   Abdominal   Peds  Hematology   Anesthesia Other Findings   Reproductive/Obstetrics                             Anesthesia Physical  Anesthesia Plan  ASA: IV  Anesthesia Plan: MAC   Post-op Pain Management:    Induction: Intravenous  PONV Risk Score and Plan: 1 and Ondansetron, Propofol infusion and Treatment may vary due to age or medical condition  Airway Management Planned:   Additional Equipment: None  Intra-op Plan:   Post-operative Plan:   Informed Consent: I have reviewed the patients History and Physical, chart, labs and discussed the procedure including the risks, benefits and alternatives for the proposed anesthesia with the patient or authorized representative who has indicated his/her understanding and acceptance.     Dental advisory given  Plan Discussed with: CRNA  Anesthesia Plan Comments:         Anesthesia Quick Evaluation                                  Anesthesia Evaluation  Patient identified by MRN, date of birth, ID band Patient awake    Reviewed: Allergy & Precautions, NPO status , Patient's Chart, lab work & pertinent test results  Airway Mallampati: II  TM Distance: >3 FB Neck ROM: Full    Dental  (+) Teeth Intact, Dental Advisory Given   Pulmonary former smoker,    breath sounds clear to auscultation       Cardiovascular hypertension,  Rhythm:Regular Rate:Normal     Neuro/Psych    GI/Hepatic   Endo/Other    Renal/GU      Musculoskeletal   Abdominal   Peds  Hematology   Anesthesia Other Findings   Reproductive/Obstetrics                             Anesthesia Physical Anesthesia  Plan  ASA: III  Anesthesia Plan: General   Post-op Pain Management:  Regional for Post-op pain   Induction: Intravenous  PONV Risk Score and Plan: Ondansetron and Dexamethasone  Airway Management Planned: Oral ETT  Additional Equipment:   Intra-op Plan:   Post-operative Plan: Extubation in OR  Informed Consent: I have reviewed the patients History and Physical, chart, labs and discussed the procedure including the risks, benefits and alternatives for the proposed anesthesia with the patient or authorized representative who has indicated his/her understanding and acceptance.     Dental advisory given  Plan Discussed with: Anesthesiologist and CRNA  Anesthesia Plan Comments:         Anesthesia Quick Evaluation

## 2019-09-26 NOTE — Brief Op Note (Signed)
09/23/2019 - 09/26/2019  2:55 PM  PATIENT:  Andre Miranda  83 y.o. male  PRE-OPERATIVE DIAGNOSIS:  blood in stool  POST-OPERATIVE DIAGNOSIS:  polyps , diverticulosis  PROCEDURE:  Procedure(s): COLONOSCOPY WITH PROPOFOL (Left) ESOPHAGOGASTRODUODENOSCOPY (EGD) WITH PROPOFOL (Left) BIOPSY POLYPECTOMY  SURGEON:  Surgeon(s) and Role:    * Overton Boggus, MD - Primary  Findings --------------- -EGD showed mild gastritis, mild duodenitis and thick fold in the fundus.  No active bleeding.  Biopsies taken -Colonoscopy showed fair prep, few colon polyps, diverticulosis, mild inflammation in the rectum and hemorrhoids.  Recommendations --------------------------- -Okay to proceed with cardiac work-up. -Continue Protonix -Add Anusol HC -Advance diet as tolerated -GI will follow  Otis Brace MD, FACP 09/26/2019, 2:56 PM  Contact #  [phone]

## 2019-09-27 ENCOUNTER — Encounter (HOSPITAL_COMMUNITY)
Admission: EM | Disposition: A | Discharge status: Home / Self Care | Payer: Self-pay | Source: Home / Self Care | Attending: Internal Medicine

## 2019-09-27 ENCOUNTER — Encounter (HOSPITAL_COMMUNITY): Payer: Self-pay | Admitting: Gastroenterology

## 2019-09-27 ENCOUNTER — Ambulatory Visit (HOSPITAL_COMMUNITY)
Admission: RE | Admit: 2019-09-27 | Payer: Medicare Other | Source: Home / Self Care | Admitting: Interventional Cardiology

## 2019-09-27 DIAGNOSIS — I25119 Atherosclerotic heart disease of native coronary artery with unspecified angina pectoris: Secondary | ICD-10-CM

## 2019-09-27 HISTORY — PX: CORONARY/GRAFT ANGIOGRAPHY: CATH118237

## 2019-09-27 LAB — CBC WITH DIFFERENTIAL/PLATELET
Abs Immature Granulocytes: 0.02 10*3/uL (ref 0.00–0.07)
Basophils Absolute: 0.1 10*3/uL (ref 0.0–0.1)
Basophils Relative: 1 %
Eosinophils Absolute: 0.2 10*3/uL (ref 0.0–0.5)
Eosinophils Relative: 3 %
HCT: 36.2 % — ABNORMAL LOW (ref 39.0–52.0)
Hemoglobin: 11.8 g/dL — ABNORMAL LOW (ref 13.0–17.0)
Immature Granulocytes: 0 %
Lymphocytes Relative: 14 %
Lymphs Abs: 0.9 10*3/uL (ref 0.7–4.0)
MCH: 30.7 pg (ref 26.0–34.0)
MCHC: 32.6 g/dL (ref 30.0–36.0)
MCV: 94.3 fL (ref 80.0–100.0)
Monocytes Absolute: 0.6 10*3/uL (ref 0.1–1.0)
Monocytes Relative: 9 %
Neutro Abs: 5.1 10*3/uL (ref 1.7–7.7)
Neutrophils Relative %: 73 %
Platelets: 89 10*3/uL — ABNORMAL LOW (ref 150–400)
RBC: 3.84 MIL/uL — ABNORMAL LOW (ref 4.22–5.81)
RDW: 14.6 % (ref 11.5–15.5)
WBC: 6.9 10*3/uL (ref 4.0–10.5)
nRBC: 0 % (ref 0.0–0.2)

## 2019-09-27 LAB — BASIC METABOLIC PANEL
Anion gap: 9 (ref 5–15)
BUN: 23 mg/dL (ref 8–23)
CO2: 25 mmol/L (ref 22–32)
Calcium: 9.1 mg/dL (ref 8.9–10.3)
Chloride: 102 mmol/L (ref 98–111)
Creatinine, Ser: 1.33 mg/dL — ABNORMAL HIGH (ref 0.61–1.24)
GFR calc Af Amer: 58 mL/min — ABNORMAL LOW (ref >60)
GFR calc non Af Amer: 50 mL/min — ABNORMAL LOW (ref >60)
Glucose, Bld: 106 mg/dL — ABNORMAL HIGH (ref 70–99)
Potassium: 4.3 mmol/L (ref 3.5–5.1)
Sodium: 136 mmol/L (ref 135–145)

## 2019-09-27 LAB — SURGICAL PATHOLOGY

## 2019-09-27 LAB — MAGNESIUM: Magnesium: 2 mg/dL (ref 1.7–2.4)

## 2019-09-27 SURGERY — CORONARY/GRAFT ANGIOGRAPHY
Anesthesia: LOCAL

## 2019-09-27 MED ORDER — ONDANSETRON HCL 4 MG/2ML IJ SOLN: 4.0000 mg | Freq: Four times a day (QID) | INTRAMUSCULAR | Status: DC | PRN

## 2019-09-27 MED ORDER — SODIUM CHLORIDE 0.9 % IV SOLN: 250.0000 mL | INTRAVENOUS | Status: DC | PRN

## 2019-09-27 MED ORDER — SODIUM CHLORIDE 0.9 % WEIGHT BASED INFUSION
1.0000 mL/kg/h | INTRAVENOUS | Status: DC
  Administered 2019-09-27: 10:00:00 1 mL/kg/h via INTRAVENOUS

## 2019-09-27 MED ORDER — SODIUM CHLORIDE 0.9% FLUSH
3.0000 mL | Freq: Two times a day (BID) | INTRAVENOUS | Status: DC
  Administered 2019-09-27: 10:00:00 3 mL via INTRAVENOUS

## 2019-09-27 MED ORDER — HEPARIN (PORCINE) IN NACL 1000-0.9 UT/500ML-% IV SOLN
INTRAVENOUS | Status: DC | PRN
  Administered 2019-09-27 (×2): 500 mL

## 2019-09-27 MED ORDER — LABETALOL HCL 5 MG/ML IV SOLN: 10.0000 mg | INTRAVENOUS | Status: DC | PRN

## 2019-09-27 MED ORDER — FENTANYL CITRATE (PF) 100 MCG/2ML IJ SOLN
INTRAMUSCULAR | Status: DC | PRN
  Administered 2019-09-27: 25 ug via INTRAVENOUS

## 2019-09-27 MED ORDER — SODIUM CHLORIDE 0.9% FLUSH: 3.0000 mL | Freq: Two times a day (BID) | INTRAVENOUS | Status: DC

## 2019-09-27 MED ORDER — LIDOCAINE HCL (PF) 1 % IJ SOLN
INTRAMUSCULAR | Status: DC | PRN
  Administered 2019-09-27: 15 mL

## 2019-09-27 MED ORDER — HYDRALAZINE HCL 20 MG/ML IJ SOLN: 10.0000 mg | INTRAMUSCULAR | Status: DC | PRN

## 2019-09-27 MED ORDER — MIDAZOLAM HCL 2 MG/2ML IJ SOLN
INTRAMUSCULAR | Status: AC
  Filled 2019-09-27: qty 2

## 2019-09-27 MED ORDER — IOHEXOL 350 MG/ML SOLN
INTRAVENOUS | Status: DC | PRN
  Administered 2019-09-27: 150 mL

## 2019-09-27 MED ORDER — ASPIRIN 81 MG PO CHEW
81.0000 mg | CHEWABLE_TABLET | ORAL | Status: AC
  Administered 2019-09-27: 09:00:00 81 mg via ORAL
  Filled 2019-09-27: qty 1

## 2019-09-27 MED ORDER — HEPARIN (PORCINE) IN NACL 1000-0.9 UT/500ML-% IV SOLN
INTRAVENOUS | Status: AC
  Filled 2019-09-27: qty 1000

## 2019-09-27 MED ORDER — PANTOPRAZOLE SODIUM 40 MG PO TBEC: 40.0000 mg | DELAYED_RELEASE_TABLET | Freq: Every day | ORAL | 0 refills | Status: DC

## 2019-09-27 MED ORDER — SODIUM CHLORIDE 0.9 % WEIGHT BASED INFUSION: 3.0000 mL/kg/h | INTRAVENOUS | Status: DC

## 2019-09-27 MED ORDER — FENTANYL CITRATE (PF) 100 MCG/2ML IJ SOLN
INTRAMUSCULAR | Status: AC
  Filled 2019-09-27: qty 2

## 2019-09-27 MED ORDER — SODIUM CHLORIDE 0.9% FLUSH: 3.0000 mL | INTRAVENOUS | Status: DC | PRN

## 2019-09-27 MED ORDER — SODIUM CHLORIDE 0.9 % IV SOLN: INTRAVENOUS | Status: AC

## 2019-09-27 MED ORDER — ACETAMINOPHEN 325 MG PO TABS: 650.0000 mg | ORAL_TABLET | ORAL | Status: DC | PRN

## 2019-09-27 MED ORDER — MIDAZOLAM HCL 2 MG/2ML IJ SOLN
INTRAMUSCULAR | Status: DC | PRN
  Administered 2019-09-27 (×2): 1 mg via INTRAVENOUS

## 2019-09-27 MED ORDER — OXYCODONE HCL 5 MG PO TABS: 5.0000 mg | ORAL_TABLET | ORAL | Status: DC | PRN

## 2019-09-27 MED ORDER — HEPARIN SODIUM (PORCINE) 5000 UNIT/ML IJ SOLN: 5000.0000 [IU] | Freq: Three times a day (TID) | INTRAMUSCULAR | Status: DC

## 2019-09-27 MED ORDER — LIDOCAINE HCL (PF) 1 % IJ SOLN
INTRAMUSCULAR | Status: AC
  Filled 2019-09-27: qty 30

## 2019-09-27 MED ORDER — NITROGLYCERIN 0.4 MG SL SUBL: 0.4000 mg | SUBLINGUAL_TABLET | SUBLINGUAL | 0 refills | Status: DC | PRN

## 2019-09-27 SURGICAL SUPPLY — 11 items
CATH DXT MULTI JL4 JR4 ANG PIG (CATHETERS) ×1 IMPLANT
CATH INFINITI 5 FR IM (CATHETERS) ×1 IMPLANT
CATH INFINITI 5 FR MPA2 (CATHETERS) ×1 IMPLANT
GUIDEWIRE ANGLED .035X150CM (WIRE) ×1 IMPLANT
KIT HEART LEFT (KITS) ×2 IMPLANT
PACK CARDIAC CATHETERIZATION (CUSTOM PROCEDURE TRAY) ×2 IMPLANT
SHEATH PINNACLE 5F 10CM (SHEATH) ×1 IMPLANT
SHEATH PROBE COVER 6X72 (BAG) ×1 IMPLANT
TRANSDUCER W/STOPCOCK (MISCELLANEOUS) ×2 IMPLANT
TUBING CIL FLEX 10 FLL-RA (TUBING) ×2 IMPLANT
WIRE EMERALD 3MM-J .035X150CM (WIRE) ×1 IMPLANT

## 2019-09-27 NOTE — Progress Notes (Signed)
Site area- right  Site Prior to Removal- 0   Pressure Applied For-  20 MInutes   Bedrest Beginning at - 1345   Manual- Yes   Patient Status During Pull- Stable    Post Pull Groin Site- 0   Post Pull Instructions Given- Yes   Post Pull Pulses Present- Yes    Dressing Applied- Tegaderm and Gauze Dressing    Comments:  tol well. Stable

## 2019-09-27 NOTE — Interval H&P Note (Signed)
Cath Lab Visit (complete for each Cath Lab visit)  Clinical Evaluation Leading to the Procedure:   ACS: Yes.    Non-ACS:    Anginal Classification: CCS III  Anti-ischemic medical therapy: Maximal Therapy (2 or more classes of medications)  Non-Invasive Test Results: No non-invasive testing performed  Prior CABG: Previous CABG      History and Physical Interval Note:  09/27/2019 12:05 PM  Andre Miranda  has presented today for surgery, with the diagnosis of cad.  The various methods of treatment have been discussed with the patient and family. After consideration of risks, benefits and other options for treatment, the patient has consented to  Procedure(s): LEFT HEART CATH AND CORONARY ANGIOGRAPHY (N/A) as a surgical intervention.  The patient's history has been reviewed, patient examined, no change in status, stable for surgery.  I have reviewed the patient's chart and labs.  Questions were answered to the patient's satisfaction.     [name]

## 2019-09-27 NOTE — Progress Notes (Signed)
Patient ID: Andre Miranda, male   DOB: Jul 25, 1936, 83 y.o.   MRN: [id number]  PROGRESS NOTE    Andre Miranda  [id number] DOB: [birth year] DOA: 09/23/2019 PCP: Crist Infante, MD   Brief Narrative:  83 year old male with history of aortic valve disease status post TAVR, CAD, hypertension, diastolic dysfunction, GERD and hyperlipidemia who was previously on diuretics but discontinued due to relative hypotension presented with shortness of breath and worsening lower extremity edema.  He was admitted with fluid overload secondary to CHF exacerbation.  Cardiology was consulted.  He also was found to have hematochezia for which GI was also consulted.  He underwent EGD and colonoscopy on 09/26/2019.  Assessment & Plan:  Acute on chronic diastolic heart failure -Echo in 08/2019 showed EF of 60 to 65% -Treated with intravenous Lasix which was discontinued on 09/25/2019 because of uptrending creatinine.  Follow further cardiology recommendations -Strict input and output, daily weights.  Fluid restriction.  Negative balance of 4717 cc since admission -Continue metoprolol and ramipril  Hematochezia -Hemoglobin 11.8 today.  GI following.  Status post EGD and colonoscopy on 09/26/2019 which showed edematous gastric fundus along with duodenitis; hemorrhoids and colonic polyps and diverticulosis.  Follow further GI recommendations.  Exertional chest tightness with history of CAD and prior CABG -Continue aspirin and statin and beta-blocker.  Cardiology planning for cardiac cath probably today.  History of recurrent syncope -Was wearing a Zio patch which showed no events  Hypertension -Continue amlodipine, ramipril and metoprolol.  Monitor blood pressure  Thrombocytopenia -Monitor.  GERD -Continue PPI  Obstructive sleep apnea -Continue CPAP  Obesity -Outpatient follow-up   DVT prophylaxis: DC'd Lovenox because of hematochezia Code Status: Full Family Communication: None at bedside   disposition Plan: Home in 1 to 2 days once cleared by cardiology Consultants: Cardiology/GI  Procedures:  EGD on 09/26/2019 Findings:      The Z-line was regular and was found 39 cm from the incisors.      No gross lesions were noted in the entire esophagus.      Scattered moderate mucosal changes characterized by congestion, erythema       and inflammationand thick folds were found in the gastric fundus.       Biopsies were taken with a cold forceps for histology.      The exam of the stomach was otherwise normal.      Scattered mild inflammation characterized by congestion (edema),       erosions and erythema was found in the duodenal bulb.      The first portion of the duodenum and second portion of the duodenum       were normal. Impression:               - Z-line regular, 39 cm from the incisors.                           - No gross lesions in esophagus.                           - Congestion, erythema and inflammationand thick                            folds mucosa in the gastric fundus. Biopsied.                           -  Duodenitis.                           - Normal first portion of the duodenum and second                            portion of the duodenum. Recommendation:           - Perform a colonoscopy today.  Colonoscopy on 09/26/2019 Findings:      Skin tags were found on perianal exam.      Hemorrhoids were found on perianal exam.      A moderate amount of liquid semi-liquid stool was found in the entire       colon, interfering with visualization. Lavage of the area was performed,       resulting in clearance with fair visualization.      A 7 mm polyp was found in the ascending colon. The polyp was sessile.       The polyp was removed with a cold snare. Resection and retrieval were       complete.      A 8 mm polyp was found in the transverse colon. The polyp was sessile.       The polyp was removed with a cold snare. Resection and retrieval were        complete.      A 8 mm polyp was found in the sigmoid colon. The polyp was sessile. The       polyp was removed with a cold snare. Resection and retrieval were       complete.      Multiple large-mouthed diverticula were found in the entire colon.      A scattered area of moderately erythematous, hemorrhagic and inflamed       mucosa was found in the rectum. Biopsies were taken with a cold forceps       for histology.      Internal hemorrhoids were found during retroflexion. The hemorrhoids       were medium-sized. Impression:               - Preparation of the colon was fair.                           - Perianal skin tags found on perianal exam.                           - Hemorrhoids found on perianal exam.                           - Stool in the entire examined colon.                           - One 7 mm polyp in the ascending colon, removed                            with a cold snare. Resected and retrieved.                           - One 8 mm polyp in the transverse colon, removed  with a cold snare. Resected and retrieved.                           - One 8 mm polyp in the sigmoid colon, removed with                            a cold snare. Resected and retrieved.                           - Diverticulosis in the entire examined colon.                           - Erythematous, hemorrhagic and inflamed mucosa in                            the rectum. Biopsied.                           - Internal hemorrhoids. Recommendation:           - Return patient to hospital ward for ongoing care.                           - Cardiac diet.                           - Continue present medications.                           - Await pathology results.                           - Repeat colonoscopy date to be determined after                            pending pathology results are reviewed for                            surveillance based on pathology results.   Antimicrobials: None  Subjective: Patient seen and examined at bedside.  No worsening shortness breath, chest pain, fever.  No black or bloody stools. Objective: Vitals:   09/26/19 2220 09/26/19 2359 09/27/19 0631 09/27/19 0631  BP: (!) 125/47   (!) 131/47  Pulse: 72   75  Resp:  11    Temp: 97.6 F (36.4 C)   98.2 F (36.8 C)  TempSrc:    Oral  SpO2: 98%   99%  Weight:   103.4 kg   Height:        Intake/Output Summary (Last 24 hours) at 09/27/2019 0820 Last data filed at 09/27/2019 [id number] Gross per 24 hour  Intake 620 ml  Output 200 ml  Net 420 ml   Filed Weights   09/25/19 0358 09/26/19 0504 09/27/19 0631  Weight: 102.6 kg 101.4 kg 103.4 kg    Examination:  General exam: No distress. Respiratory system: Bilateral decreased breath sounds at bases with scattered rales.  No wheezing  cardiovascular system: Rate controlled, S1-S2 heard Gastrointestinal system: Abdomen is nondistended, soft and nontender. Normal bowel sounds heard.  Extremities: No cyanosis, clubbing; trace lower extremity edema   Data Reviewed: I have personally reviewed following labs and imaging studies  CBC: Recent Labs  Lab 09/23/19 1600 09/23/19 2136 09/24/19 0327 09/25/19 0353 09/26/19 0416 09/27/19 0344  WBC 8.9 7.7 7.6 7.8 8.4 6.9  NEUTROABS 7.1  --  5.7  --   --  5.1  HGB 12.9* 13.2 12.9* 12.4* 12.3* 11.8*  HCT 40.1 40.4 38.9* 38.3* 38.0* 36.2*  MCV 95.7 93.5 92.4 94.3 93.6 94.3  PLT 117* 115* 110* 104* 107* 89*   Basic Metabolic Panel: Recent Labs  Lab 09/23/19 1600 09/23/19 2136 09/24/19 0327 09/25/19 0353 09/26/19 0416 09/27/19 0344  NA 134*  --  137 137 137 136  K 4.5  --  4.1 3.9 3.3* 4.3  CL 102  --  107 103 103 102  CO2 21*  --  21* 25 21* 25  GLUCOSE 112*  --  102* 101* 100* 106*  BUN 20  --  19 24* 24* 23  CREATININE 1.09 1.17 1.17 1.27* 1.28* 1.33*  CALCIUM 9.3  --  9.2 9.3 9.2 9.1  MG  --   --   --   --   --  2.0   GFR: Estimated Creatinine Clearance: 52.5  mL/min (A) (by C-G formula based on SCr of 1.33 mg/dL (H)). Liver Function Tests: No results for input(s): AST, ALT, ALKPHOS, BILITOT, PROT, ALBUMIN in the last 168 hours. No results for input(s): LIPASE, AMYLASE in the last 168 hours. No results for input(s): AMMONIA in the last 168 hours. Coagulation Profile: No results for input(s): INR, PROTIME in the last 168 hours. Cardiac Enzymes: No results for input(s): CKTOTAL, CKMB, CKMBINDEX, TROPONINI in the last 168 hours. BNP (last 3 results) No results for input(s): PROBNP in the last 8760 hours. HbA1C: No results for input(s): HGBA1C in the last 72 hours. CBG: No results for input(s): GLUCAP in the last 168 hours. Lipid Profile: No results for input(s): CHOL, HDL, LDLCALC, TRIG, CHOLHDL, LDLDIRECT in the last 72 hours. Thyroid Function Tests: No results for input(s): TSH, T4TOTAL, FREET4, T3FREE, THYROIDAB in the last 72 hours. Anemia Panel: No results for input(s): VITAMINB12, FOLATE, FERRITIN, TIBC, IRON, RETICCTPCT in the last 72 hours. Sepsis Labs: No results for input(s): PROCALCITON, LATICACIDVEN in the last 168 hours.  Recent Results (from the past 240 hour(s))  SARS CORONAVIRUS 2 (TAT 6-24 HRS) Nasopharyngeal Nasopharyngeal Swab     Status: None   Collection Time: 09/23/19  3:44 PM   Specimen: Nasopharyngeal Swab  Result Value Ref Range Status   SARS Coronavirus 2 NEGATIVE NEGATIVE Final    Comment: (NOTE) SARS-CoV-2 target nucleic acids are NOT DETECTED. The SARS-CoV-2 RNA is generally detectable in upper and lower respiratory specimens during the acute phase of infection. Negative results do not preclude SARS-CoV-2 infection, do not rule out co-infections with other pathogens, and should not be used as the sole basis for treatment or other patient management decisions. Negative results must be combined with clinical observations, patient history, and epidemiological information. The expected result is Negative. Fact  Sheet for Patients: [employer] Fact Sheet for Healthcare Providers: https://www.woods-mathews.com/ This test is not yet approved or cleared by the [place] FDA and  has been authorized for detection and/or diagnosis of SARS-CoV-2 by FDA under an Emergency Use Authorization (EUA). This EUA will remain  in effect (meaning this test can be used) for the duration of the COVID-19 declaration under Section 56 4(b)(1) of the Act, 21 U.S.C.  section 360bbb-3(b)(1), unless the authorization is terminated or revoked sooner. Performed at [facility], [place] [address] [place], [place] [address]          Radiology Studies: No results found.      Scheduled Meds: . allopurinol  300 mg Oral Daily  . amLODipine  10 mg Oral Daily  . aspirin  81 mg Oral Pre-Cath  . aspirin EC  81 mg Oral QHS  . cholecalciferol  1,000 Units Oral QHS  . famotidine  20 mg Oral Daily  . hydrocortisone  25 mg Rectal QHS  . metoprolol tartrate  25 mg Oral BID  . multivitamin with minerals  1 tablet Oral Daily  . pantoprazole  40 mg Oral Daily  . ramipril  5 mg Oral Daily  . rosuvastatin  10 mg Oral QPM  . sodium chloride flush  3 mL Intravenous Q12H  . tamsulosin  0.4 mg Oral QPM   Continuous Infusions: . sodium chloride    . sodium chloride     Followed by  . sodium chloride            Aline August, MD [facility] 09/27/2019, 8:20 AM

## 2019-09-27 NOTE — CV Procedure (Signed)
   Left heart cath, coronary angiography, and bypass graft angiography via right femoral approach.  Did not cross the prosthetic TAVR valve.  RIMA to the distal RCA is widely patent.  Diffuse disease is noted in LV branches and PDA.  LIMA to the LAD is widely patent.  The LAD beyond the graft insertion site has segmental severe diffuse disease 75 to 85% without focal abnormality.  Sequential saphenous vein graft to the ramus and distal circumflex is widely patent.  There is less than 30% stenosis in the proximal graft.  Native RCA totally occluded  Native left main is patent  LAD totally occluded after the first septal perforator  Circumflex totally occluded proximally  Please see 2D Doppler echocardiogram report for valve hemodynamics and LV function.  Recommendations: Angina likely related to distal vessel disease in LAD and distal RCA territory.

## 2019-09-27 NOTE — Progress Notes (Signed)
This note also relates to the following rows which could not be included: Resp - Cannot attach notes to unvalidated device data  Pt places self on and off CPAP

## 2019-09-27 NOTE — Plan of Care (Signed)
  Problem: Education: Goal: Ability to demonstrate management of disease process will improve Outcome: Progressing   Problem: Cardiac: Goal: Ability to achieve and maintain adequate cardiopulmonary perfusion will improve Outcome: Progressing

## 2019-09-27 NOTE — Discharge Summary (Signed)
Physician Discharge Summary  Andre Miranda [id number] DOB: March 06, 1936 DOA: 09/23/2019  PCP: Crist Infante, MD  Admit date: 09/23/2019 Discharge date: 09/27/2019  Admitted From: Home Disposition: Home  Recommendations for Outpatient Follow-up:  1. Follow up with PCP in 1 week with repeat CBC/BMP 2. Outpatient follow-up with cardiology and GI 3. Follow up in ED if symptoms worsen or new appear   Home Health: No Equipment/Devices: None  Discharge Condition: Stable CODE STATUS: Full Diet recommendation: Heart healthy  Brief/Interim Summary: 83 year old male with history of aortic valve disease status post TAVR, CAD, hypertension, diastolic dysfunction, GERD and hyperlipidemia who was previously on diuretics but discontinued due to relative hypotension presented with shortness of breath and worsening lower extremity edema.  He was admitted with fluid overload secondary to CHF exacerbation.  Cardiology was consulted.  He also was found to have hematochezia for which GI was also consulted.  He underwent EGD and colonoscopy on 09/26/2019.  He had cardiac catheterization done on 09/27/2019 which showed diffuse distal disease and no interventions were done.  Cardiology has cleared the patient for discharge today once cath site is stable.  Outpatient follow-up with cardiology and GI.   Discharge Diagnoses:   Acute on chronic diastolic heart failure -Echo in 08/2019 showed EF of 60 to 65% -Treated with intravenous Lasix which was discontinued on 09/25/2019 because of uptrending creatinine.  -Strict input and output, daily weights.  Fluid restriction.  Negative balance of 4580.4 cc since admission -Continue metoprolol and ramipril -Cardiology has noted restarted Lasix.  Outpatient follow-up with cardiology.  Hematochezia -Hemoglobin 11.8 today.  Status post EGD and colonoscopy on 09/26/2019 which showed edematous gastric fundus along with duodenitis; hemorrhoids and colonic polyps and  diverticulosis.   -Continue oral Protonix.  Outpatient follow-up with GI.  No further episodes of hematochezia  Exertional chest tightness with history of CAD and prior CABG -Continue aspirin and statin and beta-blocker.  Cardiology planning for cardiac cath probably today. -He had cardiac catheterization done on 09/27/2019 which showed diffuse distal disease and no interventions were done.  Cardiology has cleared the patient for discharge today once cath site is stable.  Outpatient follow-up with cardiology   History of recurrent syncope -Was wearing a Zio patch which showed no events  Hypertension -Continue amlodipine, ramipril and metoprolol.  Monitor blood pressure  Thrombocytopenia -Outpatient follow-up  GERD -Continue PPI  Obstructive sleep apnea -Continue CPAP  Obesity -Outpatient follow-up Discharge Instructions  Discharge Instructions    Ambulatory referral to Cardiology   Complete by: As directed    Diet - low sodium heart healthy   Complete by: As directed    Increase activity slowly   Complete by: As directed      Allergies as of 09/27/2019      Reactions   Ibuprofen Hypertension      Medication List    STOP taking these medications   levothyroxine 25 MCG tablet Commonly known as: SYNTHROID     TAKE these medications   allopurinol 300 MG tablet Commonly known as: ZYLOPRIM Take 300 mg by mouth daily.   amLODipine 10 MG tablet Commonly known as: NORVASC TAKE 1 TABLET BY MOUTH ONCE DAILY   amoxicillin 500 MG capsule Commonly known as: AMOXIL Take 2,000 mg by mouth See admin instructions. Take 2,000 mg by mouth one hour prior to dental appointments   aspirin EC 81 MG tablet Take 81 mg by mouth at bedtime.   Centrum Silver tablet Take 1 tablet by mouth daily with breakfast.  famotidine 20 MG tablet Commonly known as: PEPCID Take 20 mg by mouth daily before breakfast.   metoprolol tartrate 25 MG tablet Commonly known as:  LOPRESSOR Take 1 tablet (25 mg total) by mouth 2 (two) times daily.   nitroGLYCERIN 0.4 MG SL tablet Commonly known as: Nitrostat Place 1 tablet (0.4 mg total) under the tongue every 5 (five) minutes as needed for chest pain.   pantoprazole 40 MG tablet Commonly known as: PROTONIX Take 1 tablet (40 mg total) by mouth daily. Start taking on: September 28, 2019   PRESCRIPTION MEDICATION CPAP: At bedtime   ramipril 5 MG capsule Commonly known as: ALTACE Take 1 capsule (5 mg total) by mouth daily.   rosuvastatin 20 MG tablet Commonly known as: CRESTOR Take 10 mg by mouth every evening.   tamsulosin 0.4 MG Caps capsule Commonly known as: FLOMAX Take 0.4 mg by mouth every evening.   temazepam 30 MG capsule Commonly known as: RESTORIL Take 30 mg by mouth at bedtime as needed for sleep.   Vitamin D3 25 MCG (1000 UT) Caps Take 1,000 Units by mouth at bedtime.      Follow-up Information    Crist Infante, MD. Schedule an appointment as soon as possible for a visit in 1 week(s).   Specialty: Internal Medicine Why: with repeat cbc/bmp  Contact information: [place] [address] [phone]        Belva Crome, MD .   Specialty: Cardiology Contact information: [phone] [address] [address] [place] [address] [phone]        Otis Brace, MD. Schedule an appointment as soon as possible for a visit in 1 week(s).   Specialty: Gastroenterology Contact information: [place] [address] [phone]          Allergies  Allergen Reactions  . Ibuprofen Hypertension    Consultations:  Cardiology/GI   Procedures/Studies: Dg Chest Portable 1 View  Result Date: 09/23/2019 CLINICAL DATA:  83 year old male with a history of dyspnea and leg swelling EXAM: PORTABLE CHEST 1 VIEW COMPARISON:  09/10/2019 FINDINGS: Cardiomediastinal silhouette unchanged in size and contour. Surgical changes of median sternotomy.  Cardiac event recorder projects over the left chest. Mixed reticulonodular opacities of the bilateral lung bases with obscuration of the hemidiaphragm bilaterally. No pneumothorax.  No significant interlobular septal thickening. IMPRESSION: Persisting reticulonodular opacities at the lung bases with new small pleural effusions, potentially multifocal infection and/or edema. Electronically Signed   By: Corrie Mckusick D.O.   On: 09/23/2019 16:03   Dg Chest Port 1 View  Result Date: 09/10/2019 CLINICAL DATA:  Syncope EXAM: PORTABLE CHEST 1 VIEW COMPARISON:  April 22, 2019 FINDINGS: There is cardiomegaly. Pulmonary vascular congestion is seen. There is also increased interstitial markings seen throughout both lungs. No pleural effusion. Overlying median sternotomy wires are present. IMPRESSION: Cardiomegaly and interstitial edema. Electronically Signed   By: Prudencio Pair M.D.   On: 09/10/2019 20:53   Vas US Carotid  Result Date: 09/12/2019 Carotid Arterial Duplex Study Indications:       Right endarterectomy and Pre syncope. Risk Factors:      Hypertension, hyperlipidemia. Comparison Study:  11/23/18 Bilat 1-39% Performing Technologist: June Leap RDMS, RVT  Examination Guidelines: A complete evaluation includes B-mode imaging, spectral Doppler, color Doppler, and power Doppler as needed of all accessible portions of each vessel. Bilateral testing is considered an integral part of a complete examination. Limited examinations for reoccurring indications may be performed as noted.  Right Carotid Findings: +----------+--------+--------+--------+------------------+------------------+           PSV cm/sEDV cm/sStenosisPlaque DescriptionComments           +----------+--------+--------+--------+------------------+------------------+ CCA Prox  146     17                                intimal thickening +----------+--------+--------+--------+------------------+------------------+ CCA Distal123     17               heterogenous                         +----------+--------+--------+--------+------------------+------------------+ ICA Prox  110     17      1-39%   heterogenous                         +----------+--------+--------+--------+------------------+------------------+ ICA Distal117     27                                                   +----------+--------+--------+--------+------------------+------------------+ ECA       218     11              heterogenous                         +----------+--------+--------+--------+------------------+------------------+ +----------+--------+-------+----------------+-------------------+           PSV cm/sEDV cmsDescribe        Arm Pressure (mmHG) +----------+--------+-------+----------------+-------------------+ [id number]            Multiphasic, WNL                    +----------+--------+-------+----------------+-------------------+ +---------+--------+--+--------+--+---------+ VertebralPSV cm/s72EDV cm/s10Antegrade +---------+--------+--+--------+--+---------+  Left Carotid Findings: +----------+--------+--------+--------+---------------------+------------------+           PSV cm/sEDV cm/sStenosisPlaque Description   Comments           +----------+--------+--------+--------+---------------------+------------------+ CCA Prox  107     7                                                       +----------+--------+--------+--------+---------------------+------------------+ CCA Distal90      9                                                       +----------+--------+--------+--------+---------------------+------------------+ ICA Prox  138     20      1-39%   heterogenous and     acoustic shadowing                                   calcific             present            +----------+--------+--------+--------+---------------------+------------------+ ICA Distal100     19                                                       +----------+--------+--------+--------+---------------------+------------------+  ECA       229     15              heterogenous                            +----------+--------+--------+--------+---------------------+------------------+ +----------+--------+--------+----------------+-------------------+           PSV cm/sEDV cm/sDescribe        Arm Pressure (mmHG) +----------+--------+--------+----------------+-------------------+ [id number]             Multiphasic, WNL                    +----------+--------+--------+----------------+-------------------+ +---------+--------+--+--------+--+---------+ VertebralPSV cm/s68EDV cm/s12Antegrade +---------+--------+--+--------+--+---------+  Summary: Right Carotid: Velocities in the right ICA are consistent with a 1-39% stenosis. Left Carotid: Velocities in the left ICA are consistent with a 1-39% stenosis.  *See table(s) above for measurements and observations.  Electronically signed by Ruta Hinds MD on 09/12/2019 at 6:39:37 PM.    Final     EGD on 09/26/2019 Findings: The Z-line was regular and was found 39 cm from the incisors. No gross lesions were noted in the entire esophagus. Scattered moderate mucosal changes characterized by congestion, erythema  and inflammationand thick folds were found in the gastric fundus.  Biopsies were taken with a cold forceps for histology. The exam of the stomach was otherwise normal. Scattered mild inflammation characterized by congestion (edema),  erosions and erythema was found in the duodenal bulb. The first portion of the duodenum and second portion of the duodenum  were normal. Impression: - Z-line regular, 39 cm from the incisors. - No gross lesions in esophagus. - Congestion, erythema and inflammationand thick   folds mucosa in the gastric fundus. Biopsied. - Duodenitis. - Normal first portion of the duodenum and second  portion of the duodenum. Recommendation: - Perform a colonoscopy today.  Colonoscopy on 09/26/2019 Findings: Skin tags were found on perianal exam. Hemorrhoids were found on perianal exam. A moderate amount of liquid semi-liquid stool was found in the entire  colon, interfering with visualization. Lavage of the area was performed,  resulting in clearance with fair visualization. A 7 mm polyp was found in the ascending colon. The polyp was sessile.  The polyp was removed with a cold snare. Resection and retrieval were  complete. A 8 mm polyp was found in the transverse colon. The polyp was sessile.  The polyp was removed with a cold snare. Resection and retrieval were  complete. A 8 mm polyp was found in the sigmoid colon. The polyp was sessile. The  polyp was removed with a cold snare. Resection and retrieval were  complete. Multiple large-mouthed diverticula were found in the entire colon. A scattered area of moderately erythematous, hemorrhagic and inflamed  mucosa was found in the rectum. Biopsies were taken with a cold forceps  for histology. Internal hemorrhoids were found during retroflexion. The hemorrhoids  were medium-sized. Impression: - Preparation of the colon was fair. - Perianal skin tags found on perianal exam. - Hemorrhoids found on perianal exam. - Stool in the entire examined colon. - One 7 mm polyp in the ascending colon, removed  with a cold snare. Resected and retrieved. -  One 8 mm polyp in the transverse colon, removed  with a cold snare. Resected and retrieved. - One 8 mm polyp in the sigmoid colon, removed with  a cold snare. Resected and retrieved. - Diverticulosis in the entire examined colon. - Erythematous, hemorrhagic and inflamed mucosa  in  the rectum. Biopsied. - Internal hemorrhoids. Recommendation: - Return patient to hospital ward for ongoing care. - Cardiac diet. - Continue present medications. - Await pathology results. - Repeat colonoscopy date to be determined after  pending pathology results are reviewed for  surveillance based on pathology results.   Cardiac catheterization on 09/27/2019  Left heart cath, coronary angiography, and bypass graft angiography via right femoral approach.  Did not cross the prosthetic TAVR valve.  RIMA to the distal RCA is widely patent.  Diffuse disease is noted in LV branches and PDA.  LIMA to the LAD is widely patent.  The LAD beyond the graft insertion site has segmental severe diffuse disease 75 to 85% without focal abnormality.  Sequential saphenous vein graft to the ramus and distal circumflex is widely patent.  There is less than 30% stenosis in the proximal graft.  Native RCA totally occluded  Native left main is patent  LAD totally occluded after the first septal perforator  Circumflex totally occluded proximally  Please see 2D Doppler echocardiogram report for valve hemodynamics and LV function.  Recommendations: Angina likely related to distal vessel disease in LAD and distal RCA territory.            Subjective: Patient seen and  examined at bedside.  No worsening shortness breath, chest pain, fever.  No black or bloody stools.  Discharge Exam: Vitals:   09/27/19 1430 09/27/19 1445  BP: (!) 109/44 (!) 122/46  Pulse:    Resp: 14 16  Temp:    SpO2:      General exam: No distress. Respiratory system: Bilateral decreased breath sounds at bases with scattered rales.  No wheezing  cardiovascular system: Rate controlled, S1-S2 heard Gastrointestinal system: Abdomen is nondistended, soft and nontender. Normal bowel sounds heard. Extremities: No cyanosis, clubbing; trace lower extremity edema    The results of significant diagnostics from this hospitalization (including imaging, microbiology, ancillary and laboratory) are listed below for reference.     Microbiology: Recent Results (from the past 240 hour(s))  SARS CORONAVIRUS 2 (TAT 6-24 HRS) Nasopharyngeal Nasopharyngeal Swab     Status: None   Collection Time: 09/23/19  3:44 PM   Specimen: Nasopharyngeal Swab  Result Value Ref Range Status   SARS Coronavirus 2 NEGATIVE NEGATIVE Final    Comment: (NOTE) SARS-CoV-2 target nucleic acids are NOT DETECTED. The SARS-CoV-2 RNA is generally detectable in upper and lower respiratory specimens during the acute phase of infection. Negative results do not preclude SARS-CoV-2 infection, do not rule out co-infections with other pathogens, and should not be used as the sole basis for treatment or other patient management decisions. Negative results must be combined with clinical observations, patient history, and epidemiological information. The expected result is Negative. Fact Sheet for Patients: SugarRoll.be Fact Sheet for Healthcare Providers: https://www.woods-mathews.com/ This test is not yet approved or cleared by the [place] FDA and  has been authorized for detection and/or diagnosis of SARS-CoV-2 by FDA under an Emergency Use Authorization (EUA). This EUA will  remain  in effect (meaning this test can be used) for the duration of the COVID-19 declaration under Section 56 4(b)(1) of the Act, 21 U.S.C. section 360bbb-3(b)(1), unless the authorization is terminated or revoked sooner. Performed at [facility], [place] [address], [place], [place] [address]      Labs: BNP (last 3 results) Recent Labs    04/22/19 1504 09/10/19 1932 09/23/19 1600  BNP 813.2* 584.6* [id number]   Basic Metabolic Panel: Recent Labs  Lab 09/23/19 1600 09/23/19 2136 09/24/19 0327 09/25/19 0353 09/26/19 0416 09/27/19 0344  NA 134*  --  137 137 137 136  K 4.5  --  4.1 3.9 3.3* 4.3  CL 102  --  107 103 103 102  CO2 21*  --  21* 25 21* 25  GLUCOSE 112*  --  102* 101* 100* 106*  BUN 20  --  19 24* 24* 23  CREATININE 1.09 1.17 1.17 1.27* 1.28* 1.33*  CALCIUM 9.3  --  9.2 9.3 9.2 9.1  MG  --   --   --   --   --  2.0   Liver Function Tests: No results for input(s): AST, ALT, ALKPHOS, BILITOT, PROT, ALBUMIN in the last 168 hours. No results for input(s): LIPASE, AMYLASE in the last 168 hours. No results for input(s): AMMONIA in the last 168 hours. CBC: Recent Labs  Lab 09/23/19 1600 09/23/19 2136 09/24/19 0327 09/25/19 0353 09/26/19 0416 09/27/19 0344  WBC 8.9 7.7 7.6 7.8 8.4 6.9  NEUTROABS 7.1  --  5.7  --   --  5.1  HGB 12.9* 13.2 12.9* 12.4* 12.3* 11.8*  HCT 40.1 40.4 38.9* 38.3* 38.0* 36.2*  MCV 95.7 93.5 92.4 94.3 93.6 94.3  PLT 117* 115* 110* 104* 107* 89*   Cardiac Enzymes: No results for input(s): CKTOTAL, CKMB, CKMBINDEX, TROPONINI in the last 168 hours. BNP: Invalid input(s): POCBNP CBG: No results for input(s): GLUCAP in the last 168 hours. D-Dimer No results for input(s): DDIMER in the last 72 hours. Hgb A1c No results for input(s): HGBA1C in the last 72 hours. Lipid Profile No results for input(s): CHOL, HDL, LDLCALC, TRIG, CHOLHDL, LDLDIRECT in the last 72 hours. Thyroid function studies No results for input(s): TSH, T4TOTAL,  T3FREE, THYROIDAB in the last 72 hours.  Invalid input(s): FREET3 Anemia work up No results for input(s): VITAMINB12, FOLATE, FERRITIN, TIBC, IRON, RETICCTPCT in the last 72 hours. Urinalysis    Component Value Date/Time   COLORURINE STRAW (A) 09/10/2019 2344   APPEARANCEUR CLEAR 09/10/2019 2344   LABSPEC 1.006 09/10/2019 2344   PHURINE 5.0 09/10/2019 2344   GLUCOSEU NEGATIVE 09/10/2019 2344   HGBUR NEGATIVE 09/10/2019 2344   BILIRUBINUR NEGATIVE 09/10/2019 2344   KETONESUR NEGATIVE 09/10/2019 2344   PROTEINUR NEGATIVE 09/10/2019 2344   UROBILINOGEN 0.2 04/21/2009 0212   NITRITE NEGATIVE 09/10/2019 2344   LEUKOCYTESUR NEGATIVE 09/10/2019 2344   Sepsis Labs Invalid input(s): PROCALCITONIN,  WBC,  LACTICIDVEN Microbiology Recent Results (from the past 240 hour(s))  SARS CORONAVIRUS 2 (TAT 6-24 HRS) Nasopharyngeal Nasopharyngeal Swab     Status: None   Collection Time: 09/23/19  3:44 PM   Specimen: Nasopharyngeal Swab  Result Value Ref Range Status   SARS Coronavirus 2 NEGATIVE NEGATIVE Final    Comment: (NOTE) SARS-CoV-2 target nucleic acids are NOT DETECTED. The SARS-CoV-2 RNA is generally detectable in upper and lower respiratory specimens during the acute phase of infection. Negative results do not preclude SARS-CoV-2 infection, do not rule out co-infections with other pathogens, and should not be used as the sole basis for treatment or other patient management decisions. Negative results must be combined with clinical observations, patient history, and epidemiological information. The expected result is Negative. Fact Sheet for Patients: SugarRoll.be Fact Sheet for Healthcare Providers: https://www.woods-mathews.com/ This test is not yet approved or cleared by the [place] FDA and  has been authorized for detection and/or diagnosis of SARS-CoV-2 by FDA under an Emergency Use Authorization (EUA). This EUA will remain  in  effect (meaning this test can be used) for the duration of the COVID-19 declaration under Section 56 4(b)(1) of the Act, 21 U.S.C. section 360bbb-3(b)(1), unless the authorization is terminated or revoked sooner. Performed at [facility], [place] [address], [place], [place] [address]      Time coordinating discharge: 35 minutes  SIGNED:   Aline August, MD  [facility] 09/27/2019, 3:39 PM

## 2019-09-27 NOTE — Anesthesia Postprocedure Evaluation (Signed)
Anesthesia Post Note  Patient: Andre Miranda  Procedure(s) Performed: COLONOSCOPY WITH PROPOFOL (Left ) ESOPHAGOGASTRODUODENOSCOPY (EGD) WITH PROPOFOL (Left ) BIOPSY POLYPECTOMY     Patient location during evaluation: PACU Anesthesia Type: MAC Level of consciousness: awake and alert Pain management: pain level controlled Vital Signs Assessment: post-procedure vital signs reviewed and stable Respiratory status: spontaneous breathing Cardiovascular status: stable Anesthetic complications: no    Last Vitals:  Vitals:   09/26/19 2359 09/27/19 0631  BP:  (!) 131/47  Pulse:  75  Resp: 11   Temp:  36.8 C  SpO2:  99%    Last Pain:  Vitals:   09/27/19 0631  TempSrc: Oral  PainSc:                  Nolon Nations

## 2019-09-27 NOTE — Progress Notes (Signed)
Progress Note  Patient Name: Andre Miranda Date of Encounter: 09/27/2019  Primary Cardiologist: [name], MD   Subjective   No chest pain and no SOB resting.   Inpatient Medications    Scheduled Meds: . allopurinol  300 mg Oral Daily  . amLODipine  10 mg Oral Daily  . aspirin EC  81 mg Oral QHS  . cholecalciferol  1,000 Units Oral QHS  . famotidine  20 mg Oral Daily  . hydrocortisone  25 mg Rectal QHS  . metoprolol tartrate  25 mg Oral BID  . multivitamin with minerals  1 tablet Oral Daily  . pantoprazole  40 mg Oral Daily  . ramipril  5 mg Oral Daily  . rosuvastatin  10 mg Oral QPM  . sodium chloride flush  3 mL Intravenous Q12H  . tamsulosin  0.4 mg Oral QPM   Continuous Infusions: . sodium chloride     PRN Meds: sodium chloride, acetaminophen, ondansetron (ZOFRAN) IV, sodium chloride flush, temazepam   Vital Signs    Vitals:   09/26/19 2220 09/26/19 2359 09/27/19 0631 09/27/19 0631  BP: (!) 125/47   (!) 131/47  Pulse: 72   75  Resp:  11    Temp: 97.6 F (36.4 C)   98.2 F (36.8 C)  TempSrc:    Oral  SpO2: 98%   99%  Weight:   103.4 kg   Height:        Intake/Output Summary (Last 24 hours) at 09/27/2019 0734 Last data filed at 09/27/2019 [id number] Gross per 24 hour  Intake 620 ml  Output 200 ml  Net 420 ml   Last 3 Weights 09/27/2019 09/26/2019 09/25/2019  Weight (lbs) 228 lb 223 lb 9.6 oz 226 lb 3.2 oz  Weight (kg) 103.42 kg 101.424 kg 102.604 kg      Telemetry    SR  - Personally Reviewed  ECG    No new - Personally Reviewed  Physical Exam   GEN: No acute distress.   Neck: No JVD Cardiac: RRR, no murmurs, rubs, or gallops.  Respiratory: Clear to auscultation bilaterally. GI: Soft, nontender, non-distended  MS: No edema; No deformity. Neuro:  Nonfocal  Psych: Normal affect   Labs    High Sensitivity Troponin:   Recent Labs  Lab 09/10/19 2201 09/10/19 2350 09/11/19 0132 09/23/19 1600 09/23/19 1742  TROPONINIHS 64*  58* 59* 150* 133*      Chemistry Recent Labs  Lab 09/25/19 0353 09/26/19 0416 09/27/19 0344  NA 137 137 136  K 3.9 3.3* 4.3  CL 103 103 102  CO2 25 21* 25  GLUCOSE 101* 100* 106*  BUN 24* 24* 23  CREATININE 1.27* 1.28* 1.33*  CALCIUM 9.3 9.2 9.1  GFRNONAA 53* 52* 50*  GFRAA >60 >60 58*  ANIONGAP 9 13 9      Hematology Recent Labs  Lab 09/25/19 0353 09/26/19 0416 09/27/19 0344  WBC 7.8 8.4 6.9  RBC 4.06* 4.06* 3.84*  HGB 12.4* 12.3* 11.8*  HCT 38.3* 38.0* 36.2*  MCV 94.3 93.6 94.3  MCH 30.5 30.3 30.7  MCHC 32.4 32.4 32.6  RDW 14.6 14.5 14.6  PLT 104* 107* 89*    BNP Recent Labs  Lab 09/23/19 1600  BNP 581.7*     DDimer No results for input(s): DDIMER in the last 168 hours.   Radiology    No results found.  Cardiac Studies   Echo 09/11/19 IMPRESSIONS    1. Left ventricular ejection fraction, by visual estimation,  is 60 to 65%. The left ventricle has normal function. Moderately increased left ventricular size. There is no left ventricular hypertrophy.  2. Left ventricular diastolic Doppler parameters are indeterminate pattern of LV diastolic filling.  3. Global right ventricle has normal systolic function.The right ventricular size is normal. No increase in right ventricular wall thickness.  4. Left atrial size was normal.  5. Right atrial size was normal.  6. Moderate mitral annular calcification. Moderate thickening of the mitral valve leaflet(s). No evidence of mitral valve regurgitation. Mild mitral stenosis.  7. The tricuspid valve is normal in structure. Tricuspid valve regurgitation is trivial.  8. S/p TAVR with 7mm Direct Flow [employer] valve valve is present in the aortic position. Procedure Date: 2016. Aortic valve area, by VTI measures 1.69 cm. Aortic valve mean gradient measures 17.2 mmHg. Aortic valve peak gradient measures 27.7 mmHg.  There is moderate perivalvular Aortic valve regurgitation by color flow Doppler.  9. The pulmonic valve  was normal in structure. Pulmonic valve regurgitation is trivial by color flow Doppler. 10. The inferior vena cava is normal in size with greater than 50% respiratory variability, suggesting right atrial pressure of 3 mmHg. 11. .  FINDINGS  Left Ventricle: Left ventricular ejection fraction, by visual estimation, is 60 to 65%. The left ventricle has normal function. There is no left ventricular hypertrophy. Moderately increased left ventricular size. Spectral Doppler shows Left ventricular  diastolic Doppler parameters are indeterminate pattern of LV diastolic filling. Indeterminate filling pressures.  Right Ventricle: The right ventricular size is normal. No increase in right ventricular wall thickness. Global RV systolic function is has normal systolic function.  Left Atrium: Left atrial size was normal in size.  Right Atrium: Right atrial size was normal in size  Pericardium: There is no evidence of pericardial effusion.  Mitral Valve: There is moderate thickening of the mitral valve leaflet(s). There is mild calcification of the mitral valve leaflet(s). Moderate mitral annular calcification. MV Area by PHT, 5.54 cm. MV PHT, 39.73 msec. No evidence of mitral valve  regurgitation.  Tricuspid Valve: The tricuspid valve is normal in structure. Tricuspid valve regurgitation is trivial by color flow Doppler.  Aortic Valve: The aortic valve has been repaired/replaced. There is moderate perivalvular AI by color flow Doppler. Aortic valve mean gradient measures 17.2 mmHg. Aortic valve peak gradient measures 27.7 mmHg. Aortic valve area, by VTI measures 1.69 cm.  S/p TAVR with 62mm Direct Flow Medical valve valve is present in the aortic position. Procedure Date: 2016.  Pulmonic Valve: The pulmonic valve was normal in structure. Pulmonic valve regurgitation is trivial by color flow Doppler.  Aorta: The aortic root, ascending aorta and aortic arch are all structurally normal, with no  evidence of dilitation or obstruction.  Venous: The inferior vena cava is normal in size with greater than 50% respiratory variability, suggesting right atrial pressure of 3 mmHg.  Shunts: No ventricular septal defect is seen or detected. There is no evidence of an atrial septal defect. No atrial level shunt detected by color flow Doppler.   Patient Profile     83 y.o. male with hx of CAD with [facility] CAB[facility] in 2003[facility], OSA,hyperlipidemia,difficult to controlHTN, and chronic diastolic HFwho is being followed for near syncope, chest tightness, elevated troponin at the request of Dr. Starla Link. Complicated by episode of hematochezia, now s/p upper and lower GI endoscopy  Assessment & Plan    Angina, stable, with known CAD s/p CABG -conduction system  disease limits uptitration of metoprolol -on amlodipine as well -previously scheduled for outpatient cath with Dr. Tamala Julian will proceed today. Discussed. No prohibitive findings on endoscopy, no further events, Hgb stable -continue aspirin -mildly elevated troponin, thought to be more 2/2 heart failure and demand vs. Type I MI. -continue rosuvastatin 20 mg  Per Dr. Harrell Gave Risks and benefits of cardiac catheterization have been discussed with the patient.  These include bleeding, infection, kidney damage, stroke, heart attack, death.  The patient understands these risks and is willing to proceed.  Near syncope: in the setting of conduction system disease -tolerating metoprolol, but concern for block as cause of near syncope vs. Other causes -monitor on telemetry  Acute on chronic diastolic heart failure: appears euvolemic today. Continue to monitor.  Negative 4717 I&O since admit  Lying flat in bed on exam.    Hypertension: continue amlodipine, ramipril, metoprolol BP controlled  Prior TAVR: continue aspirin  EGD with mild gastritis mild duodenitis no active bleeding and colonoscopy  with polyps and polypectomy  Mild inflammation in the rectum and hemorrhoids.    NPO at midnight for cath with Dr. Tamala Julian  today     For questions or updates, please contact Loop Please consult [email or web address] for contact info under        Signed, Cecilie Kicks, NP  09/27/2019, 7:34 AM

## 2019-09-27 NOTE — Progress Notes (Signed)
Brief cardiology follow up  Cath reviewed, no interventions, diffuse distal disease.   Ok to discharge home today once cath site stable.  [facility] will sign off.   Medication Recommendations:  No changes were made to outpatient cardiology meds; continue amlodipine, aspirin, PRN amoxicillin for dental cleanings, metoprolol, ramipril, rosuvastatin. Other recommendations (labs, testing, etc):  BMET at follow up Follow up as an outpatient:  Andre Miranda 10/05/19

## 2019-09-28 ENCOUNTER — Telehealth: Payer: Self-pay | Admitting: Interventional Cardiology

## 2019-09-28 ENCOUNTER — Encounter (HOSPITAL_COMMUNITY): Payer: Self-pay | Admitting: Interventional Cardiology

## 2019-09-28 NOTE — Telephone Encounter (Signed)
Patient was released from hospital yesterday following cath procedure and states that he believes he was told that Dr. Tamala Julian was going to put him back on Lasix. He does not see Lasix on his medication list and would like some clarification. He would also like Dr. Tamala Julian to know that when he transferred from his hospital bed to the hospital table he had SOB when exerting himself.

## 2019-09-29 MED ORDER — FUROSEMIDE 40 MG PO TABS: 40.0000 mg | ORAL_TABLET | Freq: Every day | ORAL | 3 refills | Status: DC

## 2019-09-29 NOTE — Telephone Encounter (Signed)
Resume furosemide 40 mg daily.  Basic metabolic panel in 5 days.

## 2019-09-29 NOTE — Telephone Encounter (Signed)
Spoke with pt and went over recommendations.  Pt coming to see Truitt Merle, NP on 11/18.  Advised pt to make sure he gets BMET while here.  Pt verbalized understanding and was in agreement with this plan.

## 2019-09-29 NOTE — Progress Notes (Addendum)
CARDIOLOGY OFFICE NOTE  Date:  10/05/2019    Andre Miranda Date of Birth: 1936/04/21 Medical Record [id number]  PCP:  Crist Infante, MD  Cardiologist:  Jennings Books    Chief Complaint  Patient presents with   Follow-up    Post cath - seen for Dr. Tamala Julian    History of Present Illness: Andre Miranda is a 83 y.o. male who presents today for a follow up visit. Seen for Dr. Tamala Julian. He is a former patient of Dr. Despina Pole I used to see.  He has a history of known CAD with prior CABG in 2003, [facility], hyperlipidemia,prior difficult to controlHTN, and chronic diastolic HF.  He last saw Dr. Tamala Julian back in July - had had recent admission for acute diastolic HF. Endorsed mild DOE first thing most mornings and mild chest tightness if walks too long/too hard. He was felt to still have volume overload on exam - diuretics were increased - possible ischemic evaluation ifsymptomsdid not improve.  I then saw him towards the end of October - he had lost weight, had realized the need for salt restriction, still having chest tightness. He was still off beta blocker and ACE since that prior admission - I restarted medical therapy at reduced doses that included beta blocker and ACE. His BP and HR were trending up.   He was then admitted with a "near" syncopal spell on 09/10/2019. It was suspected to be due to hypotension. Did not appear his medicines were changed. He had a recurrent spell less than 24 hours after his discharge - I cut back diuretics and ordered monitor. He was then seen in the office earlier this month - still with chest tightness. Noted bifascicular block - we got a monitor put on him that day here in the office. He has had recurrent chest pain - Dr. Tamala Julian has had the patient proceed with repeat cath - see below -   The patient and his wife do not have symptoms concerning for COVID-19 infection (fever, chills, cough, or new  shortness of breath).   Comes in today. Here with his wife. He is in a wheelchair today. His swelling is getting worse - he is back on some Lasix now.  Feels more short of breath - comes on abruptly. Has some swelling in his ankles. His chest pain continues. Has not had recurrent syncope. Getting ready to place 2nd ZIO later today.   Past Medical History:  Diagnosis Date   Aortic stenosis    MODERATE   Cancer ([place])    prostate cancer-radiation   Carotid arterial disease ([place])    Coronary artery disease    Dysuria    GERD (gastroesophageal reflux disease)    Gout    Hematuria    History of urinary retention    Hyperlipidemia    Hypertension    Obesity    Sleep apnea    uses CPAP nightly    Past Surgical History:  Procedure Laterality Date   AORTIC VALVE REPAIR  February 06, 2015   Dr. Aline Brochure and Dr. Ysidro Evert  at [facility]  09/26/2019   Procedure: BIOPSY;  Surgeon: Otis Brace, MD;  Location: [facility];  Service: Gastroenterology;;   CAROTID ENDARTERECTOMY  02/2008   CHOLECYSTECTOMY  04/2002   COLONOSCOPY WITH PROPOFOL Left 09/26/2019   Procedure: COLONOSCOPY WITH PROPOFOL;  Surgeon: Otis Brace, MD;  Location: [facility];  Service: Gastroenterology;  Laterality: Left;  CORONARY ARTERY BYPASS GRAFT  2003   LIMA TO THE LAD, RIMA TO THE RCA, AND A SAPHENOUS VEIN GRAFT TO THE INTERMEDIATE AND DISTAL LEFT CIRCUMFLEX   CORONARY/GRAFT ANGIOGRAPHY N/A 09/27/2019   Procedure: CORONARY/GRAFT ANGIOGRAPHY;  Surgeon: Belva Crome, MD;  Location: [facility];  Service: Cardiovascular;  Laterality: N/A;   ESOPHAGOGASTRODUODENOSCOPY (EGD) WITH PROPOFOL Left 09/26/2019   Procedure: ESOPHAGOGASTRODUODENOSCOPY (EGD) WITH PROPOFOL;  Surgeon: Otis Brace, MD;  Location: [facility];  Service: Gastroenterology;  Laterality: Left;   ORIF ANKLE FRACTURE Right 02/14/2019   Procedure: OPEN REDUCTION INTERNAL FIXATION (ORIF) ANKLE FRACTURE;   Surgeon: Netta Cedars, MD;  Location: [facility];  Service: Orthopedics;  Laterality: Right;   POLYPECTOMY  09/26/2019   Procedure: POLYPECTOMY;  Surgeon: Otis Brace, MD;  Location: [facility];  Service: Gastroenterology;;   US ECHOCARDIOGRAPHY  08/2009   SHOWED A MEAN GRADIENT ACROSS IS AORTIC VALVE OF 24 MM OF MERCURY. HE HAD MODERATE LVH AND NORMAL LV FUNCTION     Medications: Current Meds  Medication Sig   allopurinol (ZYLOPRIM) 300 MG tablet Take 300 mg by mouth daily.     amLODipine (NORVASC) 10 MG tablet TAKE 1 TABLET BY MOUTH ONCE DAILY   amoxicillin (AMOXIL) 500 MG capsule Take 2,000 mg by mouth See admin instructions. Take 2,000 mg by mouth one hour prior to dental appointments   aspirin EC 81 MG tablet Take 81 mg by mouth at bedtime.    Cholecalciferol (VITAMIN D3) 1000 UNITS CAPS Take 1,000 Units by mouth at bedtime.    famotidine (PEPCID) 20 MG tablet Take 20 mg by mouth daily before breakfast.    furosemide (LASIX) 40 MG tablet Take 1 tablet (40 mg total) by mouth daily.   metoprolol tartrate (LOPRESSOR) 25 MG tablet Take 1 tablet (25 mg total) by mouth 2 (two) times daily.   Multiple Vitamins-Minerals (CENTRUM SILVER) tablet Take 1 tablet by mouth daily with breakfast.    nitroGLYCERIN (NITROSTAT) 0.4 MG SL tablet Place 1 tablet (0.4 mg total) under the tongue every 5 (five) minutes as needed for chest pain.   pantoprazole (PROTONIX) 40 MG tablet Take 1 tablet (40 mg total) by mouth daily.   PRESCRIPTION MEDICATION CPAP: At bedtime   ramipril (ALTACE) 5 MG capsule Take 1 capsule (5 mg total) by mouth daily.   rosuvastatin (CRESTOR) 20 MG tablet Take 10 mg by mouth every evening.   Tamsulosin HCl (FLOMAX) 0.4 MG CAPS Take 0.4 mg by mouth every evening.    temazepam (RESTORIL) 30 MG capsule Take 30 mg by mouth at bedtime as needed for sleep.     Allergies: Allergies  Allergen Reactions   Ibuprofen Hypertension    Social  History: The patient  reports that he quit smoking about 48 years ago. His smoking use included cigarettes. He has a 10.50 pack-year smoking history. He has never used smokeless tobacco. He reports current alcohol use. He reports that he does not use drugs.   Family History: The patient's family history includes Heart attack in his father; Pneumonia (age of onset: 57) in his mother.   Review of Systems: Please see the history of present illness.   All other systems are reviewed and negative.   Physical Exam: VS:  BP 110/60    Pulse 70    Ht 5\' 10"  (1.778 m)    Wt 234 lb (106.1 kg)    SpO2 97%    BMI 33.58 kg/m  .  BMI Body mass  index is 33.58 kg/m.  Wt Readings from Last 3 Encounters:  10/05/19 234 lb (106.1 kg)  09/27/19 226 lb 12.8 oz (102.9 kg)  09/19/19 238 lb 1.9 oz (108 kg)   BP is 110/38 by me  General: Pleasant. He looks chronically ill but is alert and in no acute distress. His weight is down a few pounds since my last visit.   HEENT: Normal.  Neck: Supple, no JVD, carotid bruits, or masses noted.  Cardiac: Regular rate and rhythm. Blowing diastolic murmur. Trace ankle edema.  Respiratory:  Lungs are clear to auscultation bilaterally with normal work of breathing.  GI: Soft and nontender.  MS: No deformity or atrophy. Gait not tested.  Skin: Warm and dry. Color is normal.  Neuro:  Strength and sensation are intact and no gross focal deficits noted.  Psych: Alert, appropriate and with normal affect.   LABORATORY DATA:  EKG:  EKG is not ordered today.  Lab Results  Component Value Date   WBC 6.9 09/27/2019   HGB 11.8 (L) 09/27/2019   HCT 36.2 (L) 09/27/2019   PLT 89 (L) 09/27/2019   GLUCOSE 106 (H) 09/27/2019   CHOL 90 04/23/2019   TRIG 79 04/23/2019   HDL 32 (L) 04/23/2019   LDLCALC 42 04/23/2019   ALT 19 09/10/2019   AST 27 09/10/2019   NA 136 09/27/2019   K 4.3 09/27/2019   CL 102 09/27/2019   CREATININE 1.33 (H) 09/27/2019   BUN 23 09/27/2019   CO2 25  09/27/2019   TSH 7.600 (H) 09/19/2019   INR 1.0 03/09/2008   HGBA1C 5.4 04/22/2019     BNP (last 3 results) Recent Labs    04/22/19 1504 09/10/19 1932 09/23/19 1600  BNP 813.2* 584.6* 581.7*    ProBNP (last 3 results) No results for input(s): PROBNP in the last 8760 hours.   Other Studies Reviewed Today:  CORONARY/GRAFT ANGIOGRAPHY 09/2019  Conclusion   The bioprosthetic TAVR valve was not crossed.  The valve partially obstructs the ostium of the left main.  Widely patent LIMA to the mid to distal LAD followed by diffuse 75 to 85% LAD atherosclerosis over a very long segment.  Not amenable to PCI.  Widely patent RIMA to distal RCA.  The very distal RCA contains diffuse disease without focal high-grade obstruction.  Widely patent sequential saphenous vein graft to the ramus intermedius and the distal circumflex.  There is 30% stenosis in the proximal body of the graft.  Total occlusion of the native right coronary in its proximal segment.  Total occlusion of the native circumflex and ramus intermedius near the left main.  Total occlusion of the proximal to mid LAD after the first septal perforator.  RECOMMENDATIONS:   Bypass grafts are patent.  The patient could be having angina producing ischemia in the distal LAD territory and also the distal right coronary territory.  This has to be treated with medication.  No explanation for the patient's syncopal episodes  Please review the recent echocardiogram for aortic valve hemodynamics.     Carotid Doppler Summary 08/2019: Right Carotid: Velocities in the right ICA are consistent with a 1-39% stenosis.  Left Carotid: Velocities in the left ICA are consistent with a 1-39% stenosis.  *See table(s) above for measurements and observations.  Electronically signed by Ruta Hinds MD on 09/12/2019 at 6:39:37 PM.    LIMITED ECHO IMPRESSIONS 08/2019  1. Left ventricular ejection fraction, by visual  estimation, is 60 to 65%. The left ventricle has normal  function. Moderately increased left ventricular size. There is no left ventricular hypertrophy. 2. Left ventricular diastolic Doppler parameters are indeterminate pattern of LV diastolic filling. 3. Global right ventricle has normal systolic function.The right ventricular size is normal. No increase in right ventricular wall thickness. 4. Left atrial size was normal. 5. Right atrial size was normal. 6. Moderate mitral annular calcification. Moderate thickening of the mitral valve leaflet(s). No evidence of mitral valve regurgitation. Mild mitral stenosis. 7. The tricuspid valve is normal in structure. Tricuspid valve regurgitation is trivial. 8. S/p TAVR with 4mm Direct Flow [employer] valve valve is present in the aortic position. Procedure Date: 2016. Aortic valve area, by VTI measures 1.69 cm. Aortic valve mean gradient measures 17.2 mmHg. Aortic valve peak gradient measures 27.7 mmHg.  There is moderate perivalvular Aortic valve regurgitation by color flow Doppler. 9. The pulmonic valve was normal in structure. Pulmonic valve regurgitation is trivial by color flow Doppler. 10. The inferior vena cava is normal in size with greater than 50% respiratory variability, suggesting right atrial pressure of 3 mmHg.   ECHOCARDIOGRAM6/2020 IMPRESSIONS  1. The left ventricle has normal systolic function with an ejection fraction of 60-65%. The cavity size was normal. There is mildly increased left ventricular wall thickness. Left ventricular diastolic Doppler parameters are consistent with impaired  relaxation. 2. Left atrial size was mildly dilated. 3. The mitral valve is abnormal. Mild thickening of the mitral valve leaflet. There is mild mitral annular calcification present. 4. The tricuspid valve is grossly normal. 5. A bioprosthesis valve is present in the aortic position. Procedure Date: 2016. 6. The aortic root and  ascending aorta are normal in size and structure. 7. The interatrial septum was not assessed.  Monitor 12/2018: Study Highlights   The predominant heart rhythm is normal sinus rhythm  Frequent predominantly isolated PVCs with occasional ventricular bigeminy and 3 beat runs. PVC burden approximately 10% over the 24-hour timeframe.  Rare PACs  Sinus bradycardia during sleep into the 40s.  No atrial fibrillation  No pauses greater than 3 seconds.     Assessment/Plan:  1.Chest pain/DOE/known CAD - remote remote CABG per [facility] in 2003 - he is now back on low dose Metoprolol - has also just undergone recent cardiac catheterization with stable findings.   2. Syncope - noted prior bifascicular block with 1st degree AV block on prior EKG- he has a ZIO in place and will be repeating for monitoring for another 2 weeks. May end up needing ILR.   3. Aortic regurgitation - felt to be moderate by echo last month - we feel like he is having diastolic HF from aortic regurgitation - discussed with Dr. Tamala Julian and patient/wife - they prefer to go back to see Dr. Aline Brochure at [facility] for evaluation. We have recommended TEE - they wish to do further work up if indicated at [facility] where his original TAVR was performed. They are going to call and make that appointment - if they have trouble they are to let us know and we will see what we can do.   3.Chronic diastolic HF- restricting salt - back on low dose diuretics which were on hold.  His aortic regurgitation is feltto be a contributing factor.    4. AS with prior TAVR - most recent echo noted - see above.   5. HTN - BP lower here today - cutting Norvasc back to 5 mg a day.    6. HLD- on Crestor - not discussed  7. Carotid disease -study  just repeated and still shows 1 to 39% bilateral disease - not discussed.   8. COVID-19 Education: The signs and symptoms of COVID-19 were discussed with the patient and how to seek care for testing (follow  up with PCP or arrange E-visit).  The importance of social distancing, staying at home, hand hygiene and wearing a mask when out in public were discussed today.  Current medicines are reviewed with the patient today.  The patient does not have concerns regarding medicines other than what has been noted above.  The following changes have been made:  See above.  Labs/ tests ordered today include:    Orders Placed This Encounter  Procedures   Basic metabolic panel     Disposition:   Further disposition pending. Family will keep [place] posted as to what is going on at [facility]. We will be happy to see back as needed.   Patient is agreeable to this plan and will call if any problems develop in the interim.   SignedTruitt Merle, NP  10/05/2019 10:07 AM  [facility] [address] [place], [place]  [address] Phone: [phone] Fax: [phone]        Addendum: 10/17/19    Cecille Rubin - on 11/25 I received a Transcatheter Aortic Valve Replacement - followed by the placement of a pacemaker.  I had been on your monitor for approx. a week prior to this - but now have been off of it for approx. 10 days.  Is  there any reason to resume using it or should I send it back since everything has changed and   I am currently being monitored by the pacemaker folks.  Thanks - and thanks for your great care.  Andre Miranda   I told him it was ok to send our monitor back. Glad for the update.   Burtis Junes, RN, [facility] [address] [place], [place]  [address] [phone]

## 2019-10-05 ENCOUNTER — Ambulatory Visit (INDEPENDENT_AMBULATORY_CARE_PROVIDER_SITE_OTHER): Payer: Medicare Other | Admitting: Nurse Practitioner

## 2019-10-05 ENCOUNTER — Ambulatory Visit (INDEPENDENT_AMBULATORY_CARE_PROVIDER_SITE_OTHER): Payer: Medicare Other

## 2019-10-05 ENCOUNTER — Encounter: Payer: Self-pay | Admitting: Nurse Practitioner

## 2019-10-05 ENCOUNTER — Other Ambulatory Visit: Payer: Self-pay

## 2019-10-05 VITALS — BP 110/60 | HR 70 | Ht 70.0 in | Wt 234.0 lb

## 2019-10-05 DIAGNOSIS — Z7189 Other specified counseling: Secondary | ICD-10-CM | POA: Diagnosis not present

## 2019-10-05 DIAGNOSIS — I351 Nonrheumatic aortic (valve) insufficiency: Secondary | ICD-10-CM

## 2019-10-05 DIAGNOSIS — I208 Other forms of angina pectoris: Secondary | ICD-10-CM | POA: Diagnosis not present

## 2019-10-05 DIAGNOSIS — I509 Heart failure, unspecified: Secondary | ICD-10-CM | POA: Diagnosis not present

## 2019-10-05 DIAGNOSIS — Z952 Presence of prosthetic heart valve: Secondary | ICD-10-CM | POA: Diagnosis not present

## 2019-10-05 DIAGNOSIS — R55 Syncope and collapse: Secondary | ICD-10-CM | POA: Diagnosis not present

## 2019-10-05 DIAGNOSIS — I10 Essential (primary) hypertension: Secondary | ICD-10-CM | POA: Diagnosis not present

## 2019-10-05 DIAGNOSIS — E7849 Other hyperlipidemia: Secondary | ICD-10-CM

## 2019-10-05 DIAGNOSIS — E039 Hypothyroidism, unspecified: Secondary | ICD-10-CM

## 2019-10-05 DIAGNOSIS — R946 Abnormal results of thyroid function studies: Secondary | ICD-10-CM | POA: Diagnosis not present

## 2019-10-05 DIAGNOSIS — K219 Gastro-esophageal reflux disease without esophagitis: Secondary | ICD-10-CM | POA: Diagnosis not present

## 2019-10-05 DIAGNOSIS — G4733 Obstructive sleep apnea (adult) (pediatric): Secondary | ICD-10-CM | POA: Diagnosis not present

## 2019-10-05 DIAGNOSIS — I5032 Chronic diastolic (congestive) heart failure: Secondary | ICD-10-CM | POA: Diagnosis not present

## 2019-10-05 DIAGNOSIS — I701 Atherosclerosis of renal artery: Secondary | ICD-10-CM

## 2019-10-05 DIAGNOSIS — R7301 Impaired fasting glucose: Secondary | ICD-10-CM | POA: Diagnosis not present

## 2019-10-05 DIAGNOSIS — I110 Hypertensive heart disease with heart failure: Secondary | ICD-10-CM | POA: Diagnosis not present

## 2019-10-05 DIAGNOSIS — I2510 Atherosclerotic heart disease of native coronary artery without angina pectoris: Secondary | ICD-10-CM | POA: Diagnosis not present

## 2019-10-05 DIAGNOSIS — D696 Thrombocytopenia, unspecified: Secondary | ICD-10-CM | POA: Diagnosis not present

## 2019-10-05 LAB — BASIC METABOLIC PANEL
BUN/Creatinine Ratio: 19 (ref 10–24)
BUN: 21 mg/dL (ref 8–27)
CO2: 23 mmol/L (ref 20–29)
Calcium: 9.3 mg/dL (ref 8.6–10.2)
Chloride: 98 mmol/L (ref 96–106)
Creatinine, Ser: 1.13 mg/dL (ref 0.76–1.27)
GFR calc Af Amer: 70 mL/min/{1.73_m2} (ref >59)
GFR calc non Af Amer: 61 mL/min/{1.73_m2} (ref >59)
Glucose: 93 mg/dL (ref 65–99)
Potassium: 3.9 mmol/L (ref 3.5–5.2)
Sodium: 137 mmol/L (ref 134–144)

## 2019-10-05 MED ORDER — AMLODIPINE BESYLATE 10 MG PO TABS: 5.0000 mg | ORAL_TABLET | Freq: Every day | ORAL | 3 refills | Status: DC

## 2019-10-05 NOTE — Patient Instructions (Addendum)
After Visit Summary:  We will be checking the following labs today - BMET   Medication Instructions:    Continue with your current medicines. BUT   I am decreasing the Norvasc to just 5 mg a day - this will be half a tablet    If you need a refill on your cardiac medications before your next appointment, please call your pharmacy.     Testing/Procedures To Be Arranged:  Keep wearing the monitor as instructed  Follow-Up:   Make arrangements to see Dr. Aline Brochure at [facility] soon (probably need "TEE") - I will send him my note from today. Let us know if you have trouble making this appointment.   Keep [place] posted as to what is going on.     At [facility], you and your health needs are our priority.  As part of our continuing mission to provide you with exceptional heart care, we have created designated Provider Care Teams.  These Care Teams include your primary Cardiologist (physician) and Advanced Practice Providers (APPs -  Physician Assistants and Nurse Practitioners) who all work together to provide you with the care you need, when you need it.  Special Instructions:  . Stay safe, stay home, wash your hands for at least 20 seconds and wear a mask when out in public.  . It was good to talk with you today.    Call the [place] office at [phone] if you have any questions, problems or concerns.

## 2019-10-06 ENCOUNTER — Ambulatory Visit: Payer: Medicare Other | Admitting: Internal Medicine

## 2019-10-06 DIAGNOSIS — R918 Other nonspecific abnormal finding of lung field: Secondary | ICD-10-CM | POA: Diagnosis not present

## 2019-10-06 DIAGNOSIS — R600 Localized edema: Secondary | ICD-10-CM | POA: Diagnosis not present

## 2019-10-06 DIAGNOSIS — I255 Ischemic cardiomyopathy: Secondary | ICD-10-CM | POA: Diagnosis present

## 2019-10-06 DIAGNOSIS — I350 Nonrheumatic aortic (valve) stenosis: Secondary | ICD-10-CM | POA: Diagnosis not present

## 2019-10-06 DIAGNOSIS — J9811 Atelectasis: Secondary | ICD-10-CM | POA: Diagnosis not present

## 2019-10-06 DIAGNOSIS — I442 Atrioventricular block, complete: Secondary | ICD-10-CM | POA: Diagnosis not present

## 2019-10-06 DIAGNOSIS — Z923 Personal history of irradiation: Secondary | ICD-10-CM | POA: Diagnosis not present

## 2019-10-06 DIAGNOSIS — K219 Gastro-esophageal reflux disease without esophagitis: Secondary | ICD-10-CM | POA: Diagnosis present

## 2019-10-06 DIAGNOSIS — E669 Obesity, unspecified: Secondary | ICD-10-CM | POA: Diagnosis not present

## 2019-10-06 DIAGNOSIS — J811 Chronic pulmonary edema: Secondary | ICD-10-CM | POA: Diagnosis not present

## 2019-10-06 DIAGNOSIS — E559 Vitamin D deficiency, unspecified: Secondary | ICD-10-CM | POA: Diagnosis present

## 2019-10-06 DIAGNOSIS — Z006 Encounter for examination for normal comparison and control in clinical research program: Secondary | ICD-10-CM | POA: Diagnosis not present

## 2019-10-06 DIAGNOSIS — R55 Syncope and collapse: Secondary | ICD-10-CM | POA: Diagnosis not present

## 2019-10-06 DIAGNOSIS — E785 Hyperlipidemia, unspecified: Secondary | ICD-10-CM | POA: Diagnosis not present

## 2019-10-06 DIAGNOSIS — R0600 Dyspnea, unspecified: Secondary | ICD-10-CM | POA: Diagnosis not present

## 2019-10-06 DIAGNOSIS — Z952 Presence of prosthetic heart valve: Secondary | ICD-10-CM | POA: Diagnosis not present

## 2019-10-06 DIAGNOSIS — I5022 Chronic systolic (congestive) heart failure: Secondary | ICD-10-CM | POA: Diagnosis not present

## 2019-10-06 DIAGNOSIS — I2510 Atherosclerotic heart disease of native coronary artery without angina pectoris: Secondary | ICD-10-CM | POA: Diagnosis not present

## 2019-10-06 DIAGNOSIS — I352 Nonrheumatic aortic (valve) stenosis with insufficiency: Secondary | ICD-10-CM | POA: Diagnosis not present

## 2019-10-06 DIAGNOSIS — I110 Hypertensive heart disease with heart failure: Secondary | ICD-10-CM | POA: Diagnosis present

## 2019-10-06 DIAGNOSIS — I5032 Chronic diastolic (congestive) heart failure: Secondary | ICD-10-CM | POA: Diagnosis present

## 2019-10-06 DIAGNOSIS — I509 Heart failure, unspecified: Secondary | ICD-10-CM | POA: Diagnosis not present

## 2019-10-06 DIAGNOSIS — Z86010 Personal history of colonic polyps: Secondary | ICD-10-CM | POA: Diagnosis not present

## 2019-10-06 DIAGNOSIS — I452 Bifascicular block: Secondary | ICD-10-CM | POA: Diagnosis present

## 2019-10-06 DIAGNOSIS — Z951 Presence of aortocoronary bypass graft: Secondary | ICD-10-CM | POA: Diagnosis not present

## 2019-10-06 DIAGNOSIS — I679 Cerebrovascular disease, unspecified: Secondary | ICD-10-CM | POA: Diagnosis present

## 2019-10-06 DIAGNOSIS — D696 Thrombocytopenia, unspecified: Secondary | ICD-10-CM | POA: Diagnosis present

## 2019-10-06 DIAGNOSIS — I4510 Unspecified right bundle-branch block: Secondary | ICD-10-CM | POA: Diagnosis present

## 2019-10-06 DIAGNOSIS — J90 Pleural effusion, not elsewhere classified: Secondary | ICD-10-CM | POA: Diagnosis not present

## 2019-10-06 DIAGNOSIS — T82897A Other specified complication of cardiac prosthetic devices, implants and grafts, initial encounter: Secondary | ICD-10-CM | POA: Diagnosis not present

## 2019-10-06 DIAGNOSIS — T8203XA Leakage of heart valve prosthesis, initial encounter: Secondary | ICD-10-CM | POA: Diagnosis not present

## 2019-10-06 DIAGNOSIS — I25119 Atherosclerotic heart disease of native coronary artery with unspecified angina pectoris: Secondary | ICD-10-CM | POA: Diagnosis present

## 2019-10-06 DIAGNOSIS — Z8546 Personal history of malignant neoplasm of prostate: Secondary | ICD-10-CM | POA: Diagnosis not present

## 2019-10-06 DIAGNOSIS — T82857A Stenosis of cardiac prosthetic devices, implants and grafts, initial encounter: Secondary | ICD-10-CM | POA: Diagnosis present

## 2019-10-06 DIAGNOSIS — I351 Nonrheumatic aortic (valve) insufficiency: Secondary | ICD-10-CM | POA: Diagnosis not present

## 2019-10-06 DIAGNOSIS — I517 Cardiomegaly: Secondary | ICD-10-CM | POA: Diagnosis not present

## 2019-10-06 DIAGNOSIS — Z79899 Other long term (current) drug therapy: Secondary | ICD-10-CM | POA: Diagnosis not present

## 2019-10-06 DIAGNOSIS — M109 Gout, unspecified: Secondary | ICD-10-CM | POA: Diagnosis present

## 2019-10-06 DIAGNOSIS — G4733 Obstructive sleep apnea (adult) (pediatric): Secondary | ICD-10-CM | POA: Diagnosis present

## 2019-10-06 DIAGNOSIS — Z9889 Other specified postprocedural states: Secondary | ICD-10-CM | POA: Diagnosis not present

## 2019-10-06 DIAGNOSIS — I739 Peripheral vascular disease, unspecified: Secondary | ICD-10-CM | POA: Diagnosis present

## 2019-10-06 DIAGNOSIS — Z20828 Contact with and (suspected) exposure to other viral communicable diseases: Secondary | ICD-10-CM | POA: Diagnosis present

## 2019-10-17 NOTE — Telephone Encounter (Signed)
Routing to [employer] to Elaine.

## 2019-10-23 DIAGNOSIS — I110 Hypertensive heart disease with heart failure: Secondary | ICD-10-CM | POA: Diagnosis not present

## 2019-10-23 DIAGNOSIS — Z950 Presence of cardiac pacemaker: Secondary | ICD-10-CM | POA: Diagnosis not present

## 2019-10-23 DIAGNOSIS — E785 Hyperlipidemia, unspecified: Secondary | ICD-10-CM | POA: Diagnosis not present

## 2019-10-23 DIAGNOSIS — I160 Hypertensive urgency: Secondary | ICD-10-CM | POA: Diagnosis not present

## 2019-10-23 DIAGNOSIS — I340 Nonrheumatic mitral (valve) insufficiency: Secondary | ICD-10-CM | POA: Diagnosis not present

## 2019-10-23 DIAGNOSIS — Z953 Presence of xenogenic heart valve: Secondary | ICD-10-CM | POA: Diagnosis not present

## 2019-10-23 DIAGNOSIS — Z20828 Contact with and (suspected) exposure to other viral communicable diseases: Secondary | ICD-10-CM | POA: Diagnosis not present

## 2019-10-23 DIAGNOSIS — J811 Chronic pulmonary edema: Secondary | ICD-10-CM | POA: Diagnosis not present

## 2019-10-23 DIAGNOSIS — Z952 Presence of prosthetic heart valve: Secondary | ICD-10-CM | POA: Diagnosis not present

## 2019-10-23 DIAGNOSIS — Z87891 Personal history of nicotine dependence: Secondary | ICD-10-CM | POA: Diagnosis not present

## 2019-10-23 DIAGNOSIS — I440 Atrioventricular block, first degree: Secondary | ICD-10-CM | POA: Diagnosis not present

## 2019-10-23 DIAGNOSIS — I2510 Atherosclerotic heart disease of native coronary artery without angina pectoris: Secondary | ICD-10-CM | POA: Diagnosis not present

## 2019-10-23 DIAGNOSIS — R079 Chest pain, unspecified: Secondary | ICD-10-CM | POA: Diagnosis not present

## 2019-10-23 DIAGNOSIS — I517 Cardiomegaly: Secondary | ICD-10-CM | POA: Diagnosis not present

## 2019-10-23 DIAGNOSIS — I4510 Unspecified right bundle-branch block: Secondary | ICD-10-CM | POA: Diagnosis not present

## 2019-10-23 DIAGNOSIS — I5020 Unspecified systolic (congestive) heart failure: Secondary | ICD-10-CM | POA: Diagnosis not present

## 2019-10-23 DIAGNOSIS — R071 Chest pain on breathing: Secondary | ICD-10-CM | POA: Diagnosis not present

## 2019-10-23 DIAGNOSIS — J90 Pleural effusion, not elsewhere classified: Secondary | ICD-10-CM | POA: Diagnosis not present

## 2019-10-24 ENCOUNTER — Other Ambulatory Visit: Payer: Self-pay | Admitting: Interventional Cardiology

## 2019-10-26 ENCOUNTER — Other Ambulatory Visit: Payer: Medicare Other

## 2019-10-26 DIAGNOSIS — R079 Chest pain, unspecified: Secondary | ICD-10-CM | POA: Diagnosis not present

## 2019-10-26 DIAGNOSIS — I5020 Unspecified systolic (congestive) heart failure: Secondary | ICD-10-CM | POA: Diagnosis not present

## 2019-10-31 ENCOUNTER — Encounter: Payer: Self-pay | Admitting: Internal Medicine

## 2019-10-31 ENCOUNTER — Ambulatory Visit (INDEPENDENT_AMBULATORY_CARE_PROVIDER_SITE_OTHER): Payer: Medicare Other | Admitting: Internal Medicine

## 2019-10-31 ENCOUNTER — Other Ambulatory Visit: Payer: Self-pay

## 2019-10-31 DIAGNOSIS — I5023 Acute on chronic systolic (congestive) heart failure: Secondary | ICD-10-CM

## 2019-10-31 DIAGNOSIS — R918 Other nonspecific abnormal finding of lung field: Secondary | ICD-10-CM | POA: Diagnosis not present

## 2019-10-31 DIAGNOSIS — G4733 Obstructive sleep apnea (adult) (pediatric): Secondary | ICD-10-CM | POA: Diagnosis not present

## 2019-10-31 DIAGNOSIS — I701 Atherosclerosis of renal artery: Secondary | ICD-10-CM | POA: Diagnosis not present

## 2019-10-31 NOTE — Patient Instructions (Signed)
We can continue CPAP auto 5-15, mask of choice, humidifier, supplies, AirView/ card  I hope this next year is much easier for you !  Please cal if we can help

## 2019-10-31 NOTE — Progress Notes (Signed)
Subjective:    Patient ID: Andre Miranda, male    DOB: Feb 17, 1936, 83 y.o.   MRN: [id number]  HPI  M former smoker followed for OSA complicated by CAD/CABG/PAD, aortic stenosis, lung nodules CPAP 10/ [employer] [facility]    Had flu vaccine PFT 06/29/2012-mild obstructive airways disease and small airways with response to bronchodilator. Normal lung volumes and diffusion. FEV13.10/106%, FEV1/FVC 0.79, FEF 25-75% 3.04/118% with 72% response to bronchodilator.  ------------------------------------------------------------------------------------  10/05/2018- 83 year old male former smoker followed for OSA complicated by history CAD/CABG/PAD, Aortic stenosis/TAVR, lung nodules/ granulomas, prostate cancer CPAP 10/ [employer]>> today replace old machine change to auto 5-15 -----mild cough and chest congestion for couple months but beginning to clear.  Otherwise has been doing well since last visit. Body weight today 260 pounds Download compliance 97.8%, AHI 1.2/hour.  He is comfortable with CPAP and sleeps better with it.  This machine is now well over 51 years old and we discussed replacement with conversion to AutoPap. Lingering cough since he and his wife got chest infections on a trip to [place] this summer.  Never frankly purulent.  10/31/2019- 83 year old male former smoker followed for OSA complicated by history CAD/CABG/PAD, Aortic stenosis/TAVR, lung nodules/ granulomas, prostate cancer CPAP auto 5-15/ [employer] Download- compliance 63%, AHI 8.7/ hr Temazepam 30 mg Hosp 11/6-10- Acute CHF, hematochezia. Had cath w  CAD, no intervention. CXR 04/22/2019-  Interstitial pulmonary edema with associated small pleural Effusions. CXR 09/23/19 1V- Persisting reticulonodular opacities at the lung bases with new small pleural effusions, potentially multifocal infection and/or Edema. Dieted off some weight. Was "miserable" midsummer when he missed CPAP. Now says he uses every night, works well and he likes nasal  mask.   ROS-see HPI + = positive Constitutional:   No-   weight loss, night sweats, fevers, chills, fatigue, lassitude. HEENT:   No-  headaches, difficulty swallowing, tooth/dental problems, sore throat,       No-  sneezing, itching, ear ache, nasal congestion, post nasal drip,  CV:  chest pain,  No-orthopnea, PND, swelling in lower extremities, anasarca,   dizziness, palpitations Resp: No-   shortness of breath with exertion or at rest.             +  productive cough,  + non-productive cough,  No- coughing up of blood.              No-   change in color of mucus.  No- wheezing.   Skin: No-   rash or lesions. GI:  No-   heartburn, indigestion, abdominal pain, nausea, vomiting,  GU:  MS:  No-   joint pain or swelling.  . Neuro-     nothing unusual Psych:  No- change in mood or affect. No depression or anxiety.  No memory loss.  Objective:   Physical Exam General- Alert, Oriented, Affect-appropriate, Distress- none acute  + Overweight Skin- rash-none, lesions- none, excoriation- none Lymphadenopathy- none Head- atraumatic Eyes- Gross vision intact, PERRLA, conjunctivae clear, secretions Ears- +Hearing aids Nose- Clear, No-Septal dev, mucus, polyps, erosion, perforation Mild external deviation Throat- Mallampati II-III , mucosa clear , drainage- none, tonsils- atrophic Neck- flexible , trachea midline, no stridor , thyroid nl, carotid no bruit   Thick neck Chest - symmetrical excursion , unlabored  Heart/CV- RRR , murmur-none heard , no gallop  , no rub, nl s1 s2  - JVD- none , edema- none, stasis changes- none, varices- none  Lung- clear to P&A, wheeze- none, cough- none, dullness-none, rub- none  Chest wall- Abd-  Br/ Gen/ Rectal- Not done, not indicated Extrem- cyanosis- none, clubbing, none, atrophy- none, strength- nl Neuro- grossly intact to observation  Assessment & Plan:

## 2019-11-16 NOTE — Telephone Encounter (Signed)
Called to clarify appointments with patient. He went to the ED 12/6 for HTN. He was seen 12/9 for follow up and again on 12/16. At 12/16 visit, he states he was instructed to follow-up with his Cardiologist in 6 or so weeks.  He has an appointment Feb 3 at [facility] for Sc[facility] check.  He has a scheduled appointment with Dr. Tamala Julian 3/5. At this point, he says he feels great. He is checking BP and weights daily and has no swelling and doesn't feel like he necessarily needs to be evaluated again. He feels OK to keep appointments as scheduled unless Cecille Rubin feels like he needs to be seen sooner.  He understands he will be called if Cecille Rubin has recommendations. He also understands to contact the office if any symptoms occur. He was grateful for call.

## 2019-12-02 ENCOUNTER — Ambulatory Visit: Payer: Medicare Other | Attending: Internal Medicine

## 2019-12-02 DIAGNOSIS — I10 Essential (primary) hypertension: Secondary | ICD-10-CM | POA: Diagnosis not present

## 2019-12-02 DIAGNOSIS — R946 Abnormal results of thyroid function studies: Secondary | ICD-10-CM | POA: Diagnosis not present

## 2019-12-02 DIAGNOSIS — Z23 Encounter for immunization: Secondary | ICD-10-CM | POA: Insufficient documentation

## 2019-12-02 DIAGNOSIS — I110 Hypertensive heart disease with heart failure: Secondary | ICD-10-CM | POA: Diagnosis not present

## 2019-12-02 NOTE — Progress Notes (Signed)
   Covid-19 Vaccination Clinic  Name:  [facility]    MRN: [id number] DOB: Feb 18, 1936  12/02/2019  Mr. Bendolph was observed post Covid-19 immunization for 15 minutes without incidence. He was provided with Vaccine Information Sheet and instruction to access the V-Safe system.   Mr. Steinberg was instructed to call 911 with any severe reactions post vaccine: Marland Kitchen Difficulty breathing  . Swelling of your face and throat  . A fast heartbeat  . A bad rash all over your body  . Dizziness and weakness    Immunizations Administered    Name Date Dose VIS Date Route   Pfizer COVID-19 Vaccine 12/02/2019  8:35 AM 0.3 mL 10/28/2019 Intramuscular   Manufacturer: [place]   Lot: [id number]   [place]: [id number]

## 2019-12-12 ENCOUNTER — Other Ambulatory Visit: Payer: Self-pay | Admitting: *Deleted

## 2019-12-12 MED ORDER — AMLODIPINE BESYLATE 5 MG PO TABS: 5.0000 mg | ORAL_TABLET | Freq: Every day | ORAL | 3 refills | Status: DC

## 2019-12-17 NOTE — Assessment & Plan Note (Signed)
CXR  09/23/2019 noted reticulonodular opacities at lung bases.  Plan- occasional CXR

## 2019-12-17 NOTE — Assessment & Plan Note (Signed)
Benefits by report.  Continue auto 5-15 with emphasis on compliance goals.

## 2019-12-17 NOTE — Assessment & Plan Note (Signed)
Hospitalized Nov, 2020- cath note reviewed. CAD with hx CABG.  Followed by cardiology

## 2019-12-21 ENCOUNTER — Ambulatory Visit: Payer: Medicare Other | Attending: Internal Medicine

## 2019-12-21 DIAGNOSIS — I9789 Other postprocedural complications and disorders of the circulatory system, not elsewhere classified: Secondary | ICD-10-CM | POA: Diagnosis not present

## 2019-12-21 DIAGNOSIS — Z950 Presence of cardiac pacemaker: Secondary | ICD-10-CM | POA: Diagnosis not present

## 2019-12-21 DIAGNOSIS — Z45018 Encounter for adjustment and management of other part of cardiac pacemaker: Secondary | ICD-10-CM | POA: Diagnosis not present

## 2019-12-21 DIAGNOSIS — Z23 Encounter for immunization: Secondary | ICD-10-CM

## 2019-12-21 DIAGNOSIS — I442 Atrioventricular block, complete: Secondary | ICD-10-CM | POA: Diagnosis not present

## 2019-12-21 NOTE — Progress Notes (Signed)
   Covid-19 Vaccination Clinic  Name:  [facility] PUENT[facility]    MRN: [id number] DOB: 1935-11-25  12/21/2019  Mr. Waldner was observed post Covid-19 immunization for 15 minutes without incidence. He was provided with Vaccine Information Sheet and instruction to access the V-Safe system.   Mr. Nordmann was instructed to call 911 with any severe reactions post vaccine: Marland Kitchen Difficulty breathing  . Swelling of your face and throat  . A fast heartbeat  . A bad rash all over your body  . Dizziness and weakness    Immunizations Administered    Name Date Dose VIS Date Route   Pfizer COVID-19 Vaccine 12/21/2019  8:15 AM 0.3 mL 10/28/2019 Intramuscular   Manufacturer: [place]   Lot: [id number]   [place]: [id number]

## 2019-12-30 DIAGNOSIS — M1712 Unilateral primary osteoarthritis, left knee: Secondary | ICD-10-CM | POA: Diagnosis not present

## 2019-12-30 DIAGNOSIS — M25562 Pain in left knee: Secondary | ICD-10-CM | POA: Diagnosis not present

## 2019-12-30 DIAGNOSIS — M2392 Unspecified internal derangement of left knee: Secondary | ICD-10-CM | POA: Diagnosis not present

## 2019-12-30 DIAGNOSIS — M238X2 Other internal derangements of left knee: Secondary | ICD-10-CM | POA: Diagnosis not present

## 2020-01-19 NOTE — Progress Notes (Signed)
Cardiology Office Note:    Date:  01/20/2020   ID:  Andre Miranda, DOB 07-Dec-1935, MRN [id number]  PCP:  Andre Infante, MD  Cardiologist:  Andre Grooms, MD   Referring MD: Andre Infante, MD   Chief Complaint  Patient presents with  . Coronary Artery Disease  . Cardiac Valve Problem  . Congestive Heart Failure    History of Present Illness:    Andre Miranda is a 84 y.o. male with a hx of CAD with prior CABG in 2003, [id number], subsequent severe aortic regurgitation --> repeat TAVR prosthesis 10/12/2019, hyperlipidemia,prior difficult to controlHTN, and chronic diastolic HF.  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  Acute on chronic diastolic heart failure resolved after repeat TAVR.  Andre Miranda has been doing great since having repeat TAVR for aortic regurgitation and heart failure.  He subsequently had a leadless pacemaker placed for intermittent complete heart block.  He is not pacemaker dependent.  Following discharge in November his pressures began running relatively low and Aldactone dose has been decreased.  I have reviewed a an extensive blood pressure record that he has And noticed his blood pressures are running and [id number] mmHg systolic since that time.  He denies shortness of breath, edema, chest pain, orthopnea, PND, palpitations, and syncope.  No chills or fever.  Past Medical History:  Diagnosis Date  . Aortic stenosis    MODERATE  . Cancer [facility]    prostate cancer-radiation  . Carotid arterial disease (Andre [facility])   . Coronary artery disease   . Dysuria   . GERD (gastroesophageal reflux disease)   . Gout   . Hematuria   . History of urinary retention   . Hyperlipidemia   . Hypertension   . Obesity   . Sleep apnea    uses CPAP nightly    Past Surgical History:  Procedure Laterality Date  . AORTIC VALVE REPAIR  February 06, 2015   Dr. Aline Miranda and Dr. Ysidro Miranda  at [facility]  . BIOPSY  09/26/2019   Procedure: BIOPSY;  Surgeon: Andre Brace, MD;  Location: [facility];  Service: Gastroenterology;;  . CAROTID ENDARTERECTOMY  02/2008  . CHOLECYSTECTOMY  04/2002  . COLONOSCOPY WITH PROPOFOL Left 09/26/2019   Procedure: COLONOSCOPY WITH PROPOFOL;  Surgeon: Andre Brace, MD;  Location: [facility];  Service: Gastroenterology;  Laterality: Left;  . CORONARY ARTERY BYPASS GRAFT  2003   LIMA TO THE LAD, RIMA TO THE RCA, AND A SAPHENOUS VEIN GRAFT TO THE INTERMEDIATE AND DISTAL LEFT CIRCUMFLEX  . CORONARY/GRAFT ANGIOGRAPHY N/A 09/27/2019   Procedure: CORONARY/GRAFT ANGIOGRAPHY;  Surgeon: Andre Crome, MD;  Location: [facility];  Service: Cardiovascular;  Laterality: N/A;  . ESOPHAGOGASTRODUODENOSCOPY (EGD) WITH PROPOFOL Left 09/26/2019   Procedure: ESOPHAGOGASTRODUODENOSCOPY (EGD) WITH PROPOFOL;  Surgeon: Andre Brace, MD;  Location: [facility];  Service: Gastroenterology;  Laterality: Left;  . ORIF ANKLE FRACTURE Right 02/14/2019   Procedure: OPEN REDUCTION INTERNAL FIXATION (ORIF) ANKLE FRACTURE;  Surgeon: Andre Cedars, MD;  Location: [facility];  Service: Orthopedics;  Laterality: Right;  . POLYPECTOMY  09/26/2019   Procedure: POLYPECTOMY;  Surgeon: Andre Brace, MD;  Location: [facility];  Service: Gastroenterology;;  . US ECHOCARDIOGRAPHY  08/2009   SHOWED A MEAN GRADIENT ACROSS IS AORTIC VALVE OF 24 MM OF MERCURY. HE HAD MODERATE LVH AND NORMAL LV FUNCTION    Current Medications: Current Meds  Medication Sig  . allopurinol (ZYLOPRIM) 300 MG tablet Take  300 mg by mouth daily.    Marland Kitchen amLODipine (NORVASC) 5 MG tablet Take 1 tablet (5 mg total) by mouth daily.  Marland Kitchen amoxicillin (AMOXIL) 500 MG capsule Take 2,000 mg by mouth See admin instructions. Take 2,000 mg by mouth one hour prior to dental appointments  . aspirin EC 81 MG tablet Take 81 mg by mouth at bedtime.   . Cholecalciferol (VITAMIN D3) 1000 UNITS CAPS Take 1,000 Units by mouth at bedtime.     . famotidine (PEPCID) 20 MG tablet Take 20 mg by mouth daily before breakfast.   . furosemide (LASIX) 40 MG tablet Take 1 tablet (40 mg total) by mouth daily.  . Multiple Vitamins-Minerals (CENTRUM SILVER) tablet Take 1 tablet by mouth daily with breakfast.   . nitroGLYCERIN (NITROSTAT) 0.4 MG SL tablet Place 1 tablet (0.4 mg total) under the tongue every 5 (five) minutes as needed for chest pain.  . polyethylene glycol powder (GLYCOLAX/MIRALAX) 17 GM/SCOOP powder polyethylene glycol 3350 17 gram oral powder packet  DISSOLVE 1 POWDER & DRINK ONCE DAILY AS NEEDED FOR CONSTIPATION (MAY START WHEN TAKING BY MOUTH) MIX IN 4 8 OUNCES OF FLUID PRIOR TO TAKING  . PRESCRIPTION MEDICATION CPAP: At bedtime  . ramipril (ALTACE) 5 MG capsule Take 1 capsule (5 mg total) by mouth daily.  . rosuvastatin (CRESTOR) 20 MG tablet Take 10 mg by mouth every evening.  Marland Kitchen spironolactone (ALDACTONE) 25 MG tablet Take 12.5 mg by mouth daily.   . Tamsulosin HCl (FLOMAX) 0.4 MG CAPS Take 0.4 mg by mouth every evening.   . temazepam (RESTORIL) 30 MG capsule Take 30 mg by mouth at bedtime as needed for sleep.     Allergies:   Ibuprofen   Social History   Socioeconomic History  . Marital status: Married    Spouse name: Not on file  . Number of children: Not on file  . Years of education: Not on file  . Highest education level: Not on file  Occupational History  . Not on file  Tobacco Use  . Smoking status: Former Smoker    Packs/day: 1.50    Years: 7.00    Pack years: 10.50    Types: Cigarettes    Quit date: 04/03/1971    Years since quitting: 48.8  . Smokeless tobacco: Never Used  Substance and Sexual Activity  . Alcohol use: Yes    Comment: social  . Drug use: No  . Sexual activity: Not on file  Other Topics Concern  . Not on file  Social History Narrative  . Not on file   Social Determinants of Health   Financial Resource Strain:   . Difficulty of Paying Living Expenses: Not on file  Food  Insecurity:   . Worried About Charity fundraiser in the Last Year: Not on file  . Ran Out of Food in the Last Year: Not on file  Transportation Needs:   . Lack of Transportation (Medical): Not on file  . Lack of Transportation (Non-Medical): Not on file  Physical Activity:   . Days of Exercise per Week: Not on file  . Minutes of Exercise per Session: Not on file  Stress:   . Feeling of Stress : Not on file  Social Connections:   . Frequency of Communication with Friends and Family: Not on file  . Frequency of Social Gatherings with Friends and Family: Not on file  . Attends Religious Services: Not on file  . Active Member of Clubs or Organizations: Not  on file  . Attends Archivist Meetings: Not on file  . Marital Status: Not on file     Family History: The patient's family history includes Heart attack in his father; Pneumonia (age of onset: 20) in his mother.  ROS:   Please see the history of present illness.    Appetite is been stable.  He is able to lie down and rest well.  Energy level is back to normal.  He has gotten the COVID-19 vaccine, completing approximately 2 weeks ago.  All other systems reviewed and are negative.  EKGs/Labs/Other Studies Reviewed:    The following studies were reviewed today: No new data.  He will be seen [facility] at [facility] in May.  EKG:  EKG no EKG today.  Recent Labs: 09/10/2019: ALT 19 09/19/2019: TSH 7.600 09/23/2019: B Natriuretic Peptide 581.7 09/27/2019: Hemoglobin 11.8; Magnesium 2.0; Platelets 89 10/05/2019: BUN 21; Creatinine, Ser 1.13; Potassium 3.9; Sodium 137  Recent Lipid Panel    Component Value Date/Time   CHOL 90 04/23/2019 0249   TRIG 79 04/23/2019 0249   HDL 32 (L) 04/23/2019 0249   CHOLHDL 2.8 04/23/2019 0249   VLDL 16 04/23/2019 0249   LDLCALC 42 04/23/2019 0249    Physical Exam:    VS:  BP 118/62   Pulse 66   Ht 5\' 10"  (1.778 m)   Wt 221 lb (100.2 kg)   SpO2 100%   BMI 31.71 kg/m     Wt Readings  from Last 3 Encounters:  01/20/20 221 lb (100.2 kg)  10/31/19 215 lb (97.5 kg)  10/05/19 234 lb (106.1 kg)     GEN: Compatible with age. No acute distress HEENT: Normal NECK: No JVD. LYMPHATICS: No lymphadenopathy CARDIAC: 2/6 to 3/6 crescendo decrescendo right upper sternal systolic murmur RRR without diastolic murmur, gallop, or edema. VASCULAR:  Normal Pulses. No bruits. RESPIRATORY:  Clear to auscultation without rales, wheezing or rhonchi  ABDOMEN: Soft, non-tender, non-distended, No pulsatile mass, MUSCULOSKELETAL: No deformity  SKIN: Warm and dry NEUROLOGIC:  Alert and oriented x 3 PSYCHIATRIC:  Normal affect   ASSESSMENT:    1. Coronary artery disease involving native coronary artery of native heart without angina pectoris   2. Other hyperlipidemia   3. History of transcatheter aortic valve replacement (TAVR)   4. Chronic diastolic heart failure ([place])   5. Essential hypertension   6. Educated about COVID-19 virus infection    PLAN:    In order of problems listed above:  1. Secondary prevention discussed  2. LDL target is being achieved with most recent value of 42 in June on Crestor 20 mg/day. 3. He is status post TAVR x2 most recently in November 2020 for severe perivalvular aortic regurgitation related to the initial TAVR valve which was placed in 2016.  No current aortic regurgitation.  Systolic murmur is audible.  I do not have the most recent echo results from [facility]. 4. There is no evidence of volume overload on exam today.  5. Target blood pressure at his age is 140/80 mmHg.  We will see how his blood pressures run over the short-term.  It may be that amlodipine can be discontinued or decreased in intensity.  We will see over time.  An alternative option would be to slightly lessen the intensity of loop diuretic therapy. 6. He has been vaccinated for COVID-19  Overall education and awareness concerning primary/secondary risk prevention was discussed in detail: LDL  less than 70, hemoglobin A1c less than 7, blood pressure  target less than 130/80 mmHg, >150 minutes of moderate aerobic activity per week, avoidance of smoking, weight control (via diet and exercise), and continued surveillance/management of/for obstructive sleep apnea.    Medication Adjustments/Labs and Tests Ordered: Current medicines are reviewed at length with the patient today.  Concerns regarding medicines are outlined above.  No orders of the defined types were placed in this encounter.  No orders of the defined types were placed in this encounter.   Patient Instructions  Medication Instructions:  Your physician recommends that you continue on your current medications as directed. Please refer to the Current Medication list given to you today.  *If you need a refill on your cardiac medications before your next appointment, please call your pharmacy*   Miranda Work: None If you have labs (blood work) drawn today and your tests are completely normal, you will receive your results only by: Marland Kitchen MyChart Message (if you have MyChart) OR . A paper copy in the mail If you have any Miranda test that is abnormal or we need to change your treatment, we will call you to review the results.   Testing/Procedures: None   Follow-Up: At [facility], you and your health needs are our priority.  As part of our continuing mission to provide you with exceptional heart care, we have created designated Provider Care Teams.  These Care Teams include your primary Cardiologist (physician) and Advanced Practice Providers (APPs -  Physician Assistants and Nurse Practitioners) who all work together to provide you with the care you need, when you need it.  We recommend signing up for the patient portal called "MyChart".  Sign up information is provided on this After Visit Summary.  MyChart is used to connect with patients for Virtual Visits (Telemedicine).  Patients are able to view Miranda/test results, encounter  notes, upcoming appointments, etc.  Non-urgent messages can be sent to your provider as well.   To learn more about what you can do with MyChart, go to [email or web address].    Your next appointment:   6-7 month(s)  The format for your next appointment:   In Person  Provider:   You may see Andre Grooms, MD or one of the following Advanced Practice Providers on your designated Care Team:    Truitt Merle, NP  Cecilie Kicks, NP  Kathyrn Drown, NP    Other Instructions      Signed, Andre Grooms, MD  01/20/2020 9:46 AM    [place]

## 2020-01-20 ENCOUNTER — Other Ambulatory Visit: Payer: Self-pay

## 2020-01-20 ENCOUNTER — Encounter: Payer: Self-pay | Admitting: Interventional Cardiology

## 2020-01-20 ENCOUNTER — Ambulatory Visit (INDEPENDENT_AMBULATORY_CARE_PROVIDER_SITE_OTHER): Payer: Medicare Other | Admitting: Interventional Cardiology

## 2020-01-20 VITALS — BP 118/62 | HR 66 | Ht 70.0 in | Wt 221.0 lb

## 2020-01-20 DIAGNOSIS — Z952 Presence of prosthetic heart valve: Secondary | ICD-10-CM

## 2020-01-20 DIAGNOSIS — I5032 Chronic diastolic (congestive) heart failure: Secondary | ICD-10-CM

## 2020-01-20 DIAGNOSIS — E7849 Other hyperlipidemia: Secondary | ICD-10-CM

## 2020-01-20 DIAGNOSIS — I10 Essential (primary) hypertension: Secondary | ICD-10-CM

## 2020-01-20 DIAGNOSIS — Z7189 Other specified counseling: Secondary | ICD-10-CM | POA: Diagnosis not present

## 2020-01-20 DIAGNOSIS — I2510 Atherosclerotic heart disease of native coronary artery without angina pectoris: Secondary | ICD-10-CM

## 2020-01-20 NOTE — Patient Instructions (Signed)
Medication Instructions:  Your physician recommends that you continue on your current medications as directed. Please refer to the Current Medication list given to you today.  *If you need a refill on your cardiac medications before your next appointment, please call your pharmacy*   Lab Work: None If you have labs (blood work) drawn today and your tests are completely normal, you will receive your results only by: . MyChart Message (if you have MyChart) OR . A paper copy in the mail If you have any lab test that is abnormal or we need to change your treatment, we will call you to review the results.   Testing/Procedures: None   Follow-Up: At [facility], you and your health needs are our priority.  As part of our continuing mission to provide you with exceptional heart care, we have created designated Provider Care Teams.  These Care Teams include your primary Cardiologist (physician) and Advanced Practice Providers (APPs -  Physician Assistants and Nurse Practitioners) who all work together to provide you with the care you need, when you need it.  We recommend signing up for the patient portal called "MyChart".  Sign up information is provided on this After Visit Summary.  MyChart is used to connect with patients for Virtual Visits (Telemedicine).  Patients are able to view lab/test results, encounter notes, upcoming appointments, etc.  Non-urgent messages can be sent to your provider as well.   To learn more about what you can do with MyChart, go to [email or web address].    Your next appointment:   6-7 month(s)  The format for your next appointment:   In Person  Provider:   You may see Henry W Smith III, MD or one of the following Advanced Practice Providers on your designated Care Team:    Lori Gerhardt, NP  Laura Ingold, NP  Jill McDaniel, NP    Other Instructions   

## 2020-01-25 DIAGNOSIS — H2513 Age-related nuclear cataract, bilateral: Secondary | ICD-10-CM | POA: Diagnosis not present

## 2020-01-25 DIAGNOSIS — H31002 Unspecified chorioretinal scars, left eye: Secondary | ICD-10-CM | POA: Diagnosis not present

## 2020-01-31 DIAGNOSIS — L570 Actinic keratosis: Secondary | ICD-10-CM | POA: Diagnosis not present

## 2020-01-31 DIAGNOSIS — Z85828 Personal history of other malignant neoplasm of skin: Secondary | ICD-10-CM | POA: Diagnosis not present

## 2020-01-31 DIAGNOSIS — L821 Other seborrheic keratosis: Secondary | ICD-10-CM | POA: Diagnosis not present

## 2020-01-31 DIAGNOSIS — L304 Erythema intertrigo: Secondary | ICD-10-CM | POA: Diagnosis not present

## 2020-01-31 DIAGNOSIS — D692 Other nonthrombocytopenic purpura: Secondary | ICD-10-CM | POA: Diagnosis not present

## 2020-01-31 DIAGNOSIS — D225 Melanocytic nevi of trunk: Secondary | ICD-10-CM | POA: Diagnosis not present

## 2020-02-02 DIAGNOSIS — I10 Essential (primary) hypertension: Secondary | ICD-10-CM | POA: Diagnosis not present

## 2020-02-02 DIAGNOSIS — Z952 Presence of prosthetic heart valve: Secondary | ICD-10-CM | POA: Diagnosis not present

## 2020-02-02 DIAGNOSIS — I2583 Coronary atherosclerosis due to lipid rich plaque: Secondary | ICD-10-CM | POA: Diagnosis not present

## 2020-02-02 DIAGNOSIS — I679 Cerebrovascular disease, unspecified: Secondary | ICD-10-CM | POA: Diagnosis not present

## 2020-02-02 DIAGNOSIS — I2510 Atherosclerotic heart disease of native coronary artery without angina pectoris: Secondary | ICD-10-CM | POA: Diagnosis not present

## 2020-02-10 DIAGNOSIS — H31002 Unspecified chorioretinal scars, left eye: Secondary | ICD-10-CM | POA: Diagnosis not present

## 2020-02-10 DIAGNOSIS — Z20822 Contact with and (suspected) exposure to covid-19: Secondary | ICD-10-CM | POA: Diagnosis not present

## 2020-02-10 DIAGNOSIS — Z20828 Contact with and (suspected) exposure to other viral communicable diseases: Secondary | ICD-10-CM | POA: Diagnosis not present

## 2020-02-10 DIAGNOSIS — H2513 Age-related nuclear cataract, bilateral: Secondary | ICD-10-CM | POA: Diagnosis not present

## 2020-02-16 DIAGNOSIS — H2513 Age-related nuclear cataract, bilateral: Secondary | ICD-10-CM | POA: Diagnosis not present

## 2020-02-16 DIAGNOSIS — H31002 Unspecified chorioretinal scars, left eye: Secondary | ICD-10-CM | POA: Diagnosis not present

## 2020-02-16 DIAGNOSIS — I10 Essential (primary) hypertension: Secondary | ICD-10-CM | POA: Diagnosis not present

## 2020-02-16 DIAGNOSIS — H25812 Combined forms of age-related cataract, left eye: Secondary | ICD-10-CM | POA: Diagnosis not present

## 2020-02-17 DIAGNOSIS — Z79899 Other long term (current) drug therapy: Secondary | ICD-10-CM | POA: Diagnosis not present

## 2020-02-17 DIAGNOSIS — H2511 Age-related nuclear cataract, right eye: Secondary | ICD-10-CM | POA: Diagnosis not present

## 2020-02-17 DIAGNOSIS — Z48810 Encounter for surgical aftercare following surgery on the sense organs: Secondary | ICD-10-CM | POA: Diagnosis not present

## 2020-02-17 DIAGNOSIS — Z961 Presence of intraocular lens: Secondary | ICD-10-CM | POA: Diagnosis not present

## 2020-02-17 DIAGNOSIS — Z7952 Long term (current) use of systemic steroids: Secondary | ICD-10-CM | POA: Diagnosis not present

## 2020-02-17 DIAGNOSIS — H31002 Unspecified chorioretinal scars, left eye: Secondary | ICD-10-CM | POA: Diagnosis not present

## 2020-03-02 DIAGNOSIS — Z20828 Contact with and (suspected) exposure to other viral communicable diseases: Secondary | ICD-10-CM | POA: Diagnosis not present

## 2020-03-02 DIAGNOSIS — Z20822 Contact with and (suspected) exposure to covid-19: Secondary | ICD-10-CM | POA: Diagnosis not present

## 2020-03-08 DIAGNOSIS — H31002 Unspecified chorioretinal scars, left eye: Secondary | ICD-10-CM | POA: Diagnosis not present

## 2020-03-08 DIAGNOSIS — H25811 Combined forms of age-related cataract, right eye: Secondary | ICD-10-CM | POA: Diagnosis not present

## 2020-03-08 DIAGNOSIS — H2513 Age-related nuclear cataract, bilateral: Secondary | ICD-10-CM | POA: Diagnosis not present

## 2020-03-28 DIAGNOSIS — Z950 Presence of cardiac pacemaker: Secondary | ICD-10-CM | POA: Diagnosis not present

## 2020-04-12 DIAGNOSIS — I440 Atrioventricular block, first degree: Secondary | ICD-10-CM | POA: Diagnosis not present

## 2020-04-12 DIAGNOSIS — Z950 Presence of cardiac pacemaker: Secondary | ICD-10-CM | POA: Diagnosis not present

## 2020-04-12 DIAGNOSIS — Z45018 Encounter for adjustment and management of other part of cardiac pacemaker: Secondary | ICD-10-CM | POA: Diagnosis not present

## 2020-04-23 DIAGNOSIS — M109 Gout, unspecified: Secondary | ICD-10-CM | POA: Diagnosis not present

## 2020-04-23 DIAGNOSIS — R7301 Impaired fasting glucose: Secondary | ICD-10-CM | POA: Diagnosis not present

## 2020-04-23 DIAGNOSIS — R946 Abnormal results of thyroid function studies: Secondary | ICD-10-CM | POA: Diagnosis not present

## 2020-04-23 DIAGNOSIS — E7849 Other hyperlipidemia: Secondary | ICD-10-CM | POA: Diagnosis not present

## 2020-04-30 DIAGNOSIS — R82998 Other abnormal findings in urine: Secondary | ICD-10-CM | POA: Diagnosis not present

## 2020-05-04 ENCOUNTER — Other Ambulatory Visit: Payer: Self-pay | Admitting: *Deleted

## 2020-05-04 DIAGNOSIS — I6521 Occlusion and stenosis of right carotid artery: Secondary | ICD-10-CM

## 2020-05-15 ENCOUNTER — Ambulatory Visit (HOSPITAL_COMMUNITY)
Admission: RE | Admit: 2020-05-15 | Discharge: 2020-05-15 | Disposition: A | Discharge status: Home / Self Care | Payer: Medicare Other | Source: Ambulatory Visit | Attending: Vascular Surgery | Admitting: Vascular Surgery

## 2020-05-15 ENCOUNTER — Other Ambulatory Visit: Payer: Self-pay

## 2020-05-15 ENCOUNTER — Ambulatory Visit: Payer: Medicare Other

## 2020-05-15 ENCOUNTER — Encounter (HOSPITAL_COMMUNITY): Payer: Self-pay

## 2020-05-15 DIAGNOSIS — I6521 Occlusion and stenosis of right carotid artery: Secondary | ICD-10-CM

## 2020-05-30 ENCOUNTER — Telehealth: Payer: Self-pay | Admitting: Interventional Cardiology

## 2020-05-30 DIAGNOSIS — I5032 Chronic diastolic (congestive) heart failure: Secondary | ICD-10-CM

## 2020-05-30 DIAGNOSIS — I10 Essential (primary) hypertension: Secondary | ICD-10-CM

## 2020-05-30 NOTE — Telephone Encounter (Signed)
Pt states BP has been running good, right around 130/70 and HRs in 70s.  Pt noticed last night that BP was elevated at 200/86.  Took his Ramipril at that time (always takes at night).  BP still elevated this morning when he got up (see list of BPs from today below).  Last BP check was at 2:15pm and it was 183/86.  Has had slight lightheadedness off and on, otherwise feels fine.  Has been able to do everything he normally does.  Only other thing pt noticed is he had to get up 5x last night to urinate but doesn't normally do that.  Denies increased salt in diet.  Advised pt I will send to Dr. Tamala Julian for review but to monitor BP 2-3 hours after AM meds and record those for a week and let us know if they remain elevated, call sooner if SBP continues to be in the 180-200 range after meds.    Pt takes Furosemide, Spironolactone and Amlodipine in the mornings around 6AM.  Takes Ramipril every evening around 11PM.

## 2020-05-30 NOTE — Telephone Encounter (Signed)
Pt c/o BP issue: STAT if pt c/o blurred vision, one-sided weakness or slurred speech  1. What are your last 5 BP readings?  Today 2:15pm 183/86 HR 89, 9:45 am this morning 141/79 HR 102,  6:45 am this morning 157/75 HR 65,  6:00am 201/92 HR 78,  11pm last night 200/86 HR 91   2. Are you having any other symptoms (ex. Dizziness, headache, blurred vision, passed out)? This morning and last night little lightheaded  3. What is your BP issue? Patient states his BP has been high especially last night. He states after he took his BP medication this morning it started to lower his BP. Please advise.

## 2020-05-31 NOTE — Telephone Encounter (Signed)
Yes, observation for now.

## 2020-06-01 NOTE — Telephone Encounter (Signed)
Spoke with pt and advised him to continue monitor BPs over the weekend and contact me on Monday with those readings.  Pt gave BPs for yesterday and today:  7/15- Takes meds around 7A Noon- 161/86, 73 3P- 170/84, 68 9:15P- 179/79, 69 11:30P- 174/88, 72  7/16- took meds at 7A 7A- 164/86, 69 8A- 154/82, 62 9A- 162/83, 70  Will update Dr. Tamala Julian. I did tell pt to only check BP 2-3 hours after meds or if he developed symptoms.

## 2020-06-03 NOTE — Telephone Encounter (Signed)
I need most recent BMET. If it is from [facility] 2020, we should perform today. If okay, will increase the Ramipril to 5 mg BID and repeat BMET in 7 days.

## 2020-06-04 MED ORDER — RAMIPRIL 5 MG PO CAPS: 5.0000 mg | ORAL_CAPSULE | Freq: Two times a day (BID) | ORAL | 3 refills | Status: DC

## 2020-06-04 NOTE — Telephone Encounter (Signed)
Recent labs from June 2021 scanned into system.  Dr. Tamala Julian reviewed and said ok to increase Ramipril to BID.  Spoke with pt and reviewed recommendations.  Pt will come for labs on Tuesday next week.  Pt appreciative for call.

## 2020-06-12 ENCOUNTER — Other Ambulatory Visit: Payer: Medicare Other

## 2020-06-12 ENCOUNTER — Other Ambulatory Visit: Payer: Self-pay

## 2020-06-12 DIAGNOSIS — I5032 Chronic diastolic (congestive) heart failure: Secondary | ICD-10-CM

## 2020-06-12 DIAGNOSIS — I10 Essential (primary) hypertension: Secondary | ICD-10-CM | POA: Diagnosis not present

## 2020-06-12 LAB — BASIC METABOLIC PANEL
BUN/Creatinine Ratio: 29 — ABNORMAL HIGH (ref 10–24)
BUN: 29 mg/dL — ABNORMAL HIGH (ref 8–27)
CO2: 22 mmol/L (ref 20–29)
Calcium: 9.4 mg/dL (ref 8.6–10.2)
Chloride: 100 mmol/L (ref 96–106)
Creatinine, Ser: 1 mg/dL (ref 0.76–1.27)
GFR calc Af Amer: 81 mL/min/{1.73_m2} (ref >59)
GFR calc non Af Amer: 70 mL/min/{1.73_m2} (ref >59)
Glucose: 93 mg/dL (ref 65–99)
Potassium: 4.6 mmol/L (ref 3.5–5.2)
Sodium: 137 mmol/L (ref 134–144)

## 2020-06-13 ENCOUNTER — Other Ambulatory Visit: Payer: Self-pay | Admitting: Interventional Cardiology

## 2020-06-13 DIAGNOSIS — Z8719 Personal history of other diseases of the digestive system: Secondary | ICD-10-CM | POA: Diagnosis not present

## 2020-06-13 DIAGNOSIS — Z86010 Personal history of colonic polyps: Secondary | ICD-10-CM | POA: Diagnosis not present

## 2020-06-13 DIAGNOSIS — K5790 Diverticulosis of intestine, part unspecified, without perforation or abscess without bleeding: Secondary | ICD-10-CM | POA: Diagnosis not present

## 2020-06-30 ENCOUNTER — Encounter: Payer: Self-pay | Admitting: Cardiology

## 2020-06-30 NOTE — Telephone Encounter (Signed)
This encounter was created in error - please disregard.

## 2020-07-04 DIAGNOSIS — J0100 Acute maxillary sinusitis, unspecified: Secondary | ICD-10-CM | POA: Diagnosis not present

## 2020-07-04 DIAGNOSIS — Z1152 Encounter for screening for COVID-19: Secondary | ICD-10-CM | POA: Diagnosis not present

## 2020-07-04 DIAGNOSIS — R05 Cough: Secondary | ICD-10-CM | POA: Diagnosis not present

## 2020-07-04 DIAGNOSIS — I110 Hypertensive heart disease with heart failure: Secondary | ICD-10-CM | POA: Diagnosis not present

## 2020-07-04 DIAGNOSIS — Z20822 Contact with and (suspected) exposure to covid-19: Secondary | ICD-10-CM | POA: Diagnosis not present

## 2020-07-04 DIAGNOSIS — G4733 Obstructive sleep apnea (adult) (pediatric): Secondary | ICD-10-CM | POA: Diagnosis not present

## 2020-07-04 DIAGNOSIS — J40 Bronchitis, not specified as acute or chronic: Secondary | ICD-10-CM | POA: Diagnosis not present

## 2020-07-06 ENCOUNTER — Telehealth: Payer: Self-pay | Admitting: Internal Medicine

## 2020-07-06 DIAGNOSIS — G4733 Obstructive sleep apnea (adult) (pediatric): Secondary | ICD-10-CM

## 2020-07-06 NOTE — Telephone Encounter (Signed)
Called patient, call went straight to VM. Left message for patient to call back on Monday.

## 2020-07-10 NOTE — Telephone Encounter (Signed)
Spoke with the pt and notified of Dr Janee Morn response. He states that he just found out that his friend has an older CPAP machine that he does not use anymore and he wants to know what Dr Annamaria Boots would think about him using this. Please advise, thank!

## 2020-07-10 NOTE — Telephone Encounter (Signed)
Should be fine- order his DME- please service old CPAP machine- filters, supplies, hose etc, and set to auto 5-15. Continue mask of choice,

## 2020-07-10 NOTE — Telephone Encounter (Signed)
LMTCB

## 2020-07-10 NOTE — Telephone Encounter (Signed)
Called and spoke with pt who states that he received information that his CPAP is being recalled. Pt stated he put the serial number online at the [employer] website and they did not give him any info in regards to when he might be able to receive a new machine.  Pt stated that his machine does have a filter (one that is a throw away one that he changes out every two weeks and a larger one that he changes out as stated by instructions).  Due to the recall and with pt being a cardiac patient as well as having OSA, pt wants to know what needs to be done with his CPAP machine. Pt stated he received info in the mail that stated that he should stop using the machine but with his medical history, pt knows that he needs to continue to using a CPAP machine.  Dr. Annamaria Boots, please advise on all this for pt in regards to what he needs to do.

## 2020-07-10 NOTE — Telephone Encounter (Signed)
Called pt and there was no answer-LMTCB °

## 2020-07-10 NOTE — Telephone Encounter (Signed)
As near as I can tell now, risk to him of continuing to use his CPAP machine is less than any potential risk from the foam particles from the CPAP machine.  So he is better off using it. [employer] has told patients it may take as much as a year to get this all straightened out. He should ask his DME company when he would be eligible to replace his current CPAP machine.  We can send a prescription if he wants to self-pay for a replacement CPAP machine by shopping on line, for instance at [employer].

## 2020-07-11 ENCOUNTER — Telehealth: Payer: Self-pay | Admitting: Interventional Cardiology

## 2020-07-11 DIAGNOSIS — I10 Essential (primary) hypertension: Secondary | ICD-10-CM

## 2020-07-11 DIAGNOSIS — I5032 Chronic diastolic (congestive) heart failure: Secondary | ICD-10-CM

## 2020-07-11 MED ORDER — SPIRONOLACTONE 25 MG PO TABS: 25.0000 mg | ORAL_TABLET | Freq: Every day | ORAL | 3 refills | Status: DC

## 2020-07-11 NOTE — Telephone Encounter (Signed)
Wife dropped off pt's BP readings from 8/1-8/17.  BPs still mostly elevated.  Pt checks BP twice daily.  Once around noon and once in the evening between 6-8:30pm.  Noon BPs range 128-180/64-81, with most SBPs in the 160s and most DBPs in the 70s.  Evening BPs range from 136-190/65-90, with most SBPs in the 160-170 range and most DBPs in the 70s.  HR ranges from 58-103 but mostly in the 70s.  Pt has appt on 9/8.  Will send to Dr. Tamala Julian for review.

## 2020-07-11 NOTE — Telephone Encounter (Signed)
Increase the Spironolactone to 25 mg daily. BMET 1 week.

## 2020-07-11 NOTE — Telephone Encounter (Signed)
Attempted to contact pt.  Phone rang several times and suddenly stopped ringing but call was still connected.  Unable to hear anyone.

## 2020-07-11 NOTE — Telephone Encounter (Signed)
Spoke with pt and made him aware of recommendations.  Pt will come for labs on 9/2.  Advised to call sooner if any issues with dose change.  Pt verbalized understanding and was in agreement with plan.

## 2020-07-16 DIAGNOSIS — R338 Other retention of urine: Secondary | ICD-10-CM | POA: Diagnosis not present

## 2020-07-16 DIAGNOSIS — C61 Malignant neoplasm of prostate: Secondary | ICD-10-CM | POA: Diagnosis not present

## 2020-07-16 DIAGNOSIS — N35011 Post-traumatic bulbous urethral stricture: Secondary | ICD-10-CM | POA: Diagnosis not present

## 2020-07-16 NOTE — Telephone Encounter (Signed)
Dr. Annamaria Boots, please see mychart message sent by pt and advise. Thanks!

## 2020-07-16 NOTE — Telephone Encounter (Signed)
I have no way to answer that. If the filter is on the intake side, then it is only filtering room air. What you care about is the air coming out of the machine. I don't know if the DME company has a filter to go between the machine and the patient.

## 2020-07-16 NOTE — Telephone Encounter (Signed)
ATC patient unable to reach LM to call back office (x2) 

## 2020-07-16 NOTE — Telephone Encounter (Signed)
Spoke with patient.  Order placed.  Nothing further needed

## 2020-07-17 ENCOUNTER — Other Ambulatory Visit (HOSPITAL_COMMUNITY): Payer: Self-pay | Admitting: Interventional Cardiology

## 2020-07-17 ENCOUNTER — Ambulatory Visit (HOSPITAL_COMMUNITY)
Admission: RE | Admit: 2020-07-17 | Discharge: 2020-07-17 | Disposition: A | Discharge status: Home / Self Care | Payer: Medicare Other | Source: Ambulatory Visit | Attending: Interventional Cardiology | Admitting: Interventional Cardiology

## 2020-07-17 ENCOUNTER — Other Ambulatory Visit: Payer: Self-pay

## 2020-07-17 DIAGNOSIS — I701 Atherosclerosis of renal artery: Secondary | ICD-10-CM | POA: Insufficient documentation

## 2020-07-18 DIAGNOSIS — Z23 Encounter for immunization: Secondary | ICD-10-CM | POA: Diagnosis not present

## 2020-07-19 ENCOUNTER — Other Ambulatory Visit: Payer: Medicare Other

## 2020-07-19 ENCOUNTER — Other Ambulatory Visit: Payer: Self-pay

## 2020-07-19 DIAGNOSIS — I5032 Chronic diastolic (congestive) heart failure: Secondary | ICD-10-CM

## 2020-07-19 DIAGNOSIS — I10 Essential (primary) hypertension: Secondary | ICD-10-CM

## 2020-07-19 LAB — BASIC METABOLIC PANEL
BUN/Creatinine Ratio: 22 (ref 10–24)
BUN: 28 mg/dL — ABNORMAL HIGH (ref 8–27)
CO2: 25 mmol/L (ref 20–29)
Calcium: 9.2 mg/dL (ref 8.6–10.2)
Chloride: 100 mmol/L (ref 96–106)
Creatinine, Ser: 1.27 mg/dL (ref 0.76–1.27)
GFR calc Af Amer: 61 mL/min/{1.73_m2} (ref >59)
GFR calc non Af Amer: 53 mL/min/{1.73_m2} — ABNORMAL LOW (ref >59)
Glucose: 83 mg/dL (ref 65–99)
Potassium: 4.6 mmol/L (ref 3.5–5.2)
Sodium: 137 mmol/L (ref 134–144)

## 2020-07-23 NOTE — Progress Notes (Signed)
Cardiology Office Note:    Date:  07/25/2020   ID:  Andre Miranda, DOB [birth year], MRN [id number]  PCP:  Crist Infante, MD  Cardiologist:  Sinclair Grooms, MD   Referring MD: Crist Infante, MD   Chief Complaint  Patient presents with   Cardiac Valve Problem   Hypertension    History of Present Illness:    Andre Miranda is a 84 y.o. male with a hx of hx of CAD with prior CABG in 2003, [id number], subsequent severe aortic regurgitation --> repeat TAVR prosthesis 10/12/2019, hyperlipidemia,priordifficult to controlHTN, and chronic diastolic HF.  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  Acute on chronic diastolic heart failure resolved after repeat TAVR.  Andre Miranda is doing well.  He is accompanied by his wife.  We have made recent changes in his antihypertensive regimen by increasing spironolactone to 25 mg daily.  Blood pressures are gradually improved to the 130 to 025 mmHg ystolic and 60 to 85 mmHg diastolic range.  For quite some time he has been having some orthostatic lightheadedness.  We discussed the potential explanation for this.  It is not worse since the recent adjustments in antihypertensive therapy have been made.  He denies orthopnea, PND, and significant lower extremity swelling.  Past Medical History:  Diagnosis Date   Aortic stenosis    MODERATE   Cancer ([place])    prostate cancer-radiation   Carotid arterial disease ([place])    Coronary artery disease    Dysuria    GERD (gastroesophageal reflux disease)    Gout    Hematuria    History of urinary retention    Hyperlipidemia    Hypertension    Obesity    Sleep apnea    uses CPAP nightly    Past Surgical History:  Procedure Laterality Date   AORTIC VALVE REPAIR  February 06, 2015   Dr. Aline Brochure and Dr. Ysidro Evert  at [place]  09/26/2019   Procedure: BIOPSY;  Surgeon: Otis Brace, MD;  Location: [facility];   Service: Gastroenterology;;   CAROTID ENDARTERECTOMY  02/2008   CHOLECYSTECTOMY  04/2002   COLONOSCOPY WITH PROPOFOL Left 09/26/2019   Procedure: COLONOSCOPY WITH PROPOFOL;  Surgeon: Otis Brace, MD;  Location: [place];  Service: Gastroenterology;  Laterality: Left;   CORONARY ARTERY BYPASS GRAFT  2003   LIMA TO THE LAD, RIMA TO THE RCA, AND A SAPHENOUS VEIN GRAFT TO THE INTERMEDIATE AND DISTAL LEFT CIRCUMFLEX   CORONARY/GRAFT ANGIOGRAPHY N/A 09/27/2019   Procedure: CORONARY/GRAFT ANGIOGRAPHY;  Surgeon: Belva Crome, MD;  Location: [facility];  Service: Cardiovascular;  Laterality: N/A;   ESOPHAGOGASTRODUODENOSCOPY (EGD) WITH PROPOFOL Left 09/26/2019   Procedure: ESOPHAGOGASTRODUODENOSCOPY (EGD) WITH PROPOFOL;  Surgeon: Otis Brace, MD;  [facility]: [facility];  Service: Gastroenterology;  Laterality: Left;   ORIF ANKLE FRACTURE Right 02/14/2019   Procedure: OPEN REDUCTION INTERNAL FIXATION (ORIF) ANKLE FRACTURE;  Surgeon: Netta Cedars, MD;  Location: [place];  Service: Orthopedics;  Laterality: Right;   POLYPECTOMY  09/26/2019   Procedure: POLYPECTOMY;  Surgeon: Otis Brace, MD;  Location: [facility];  Service: Gastroenterology;;   US ECHOCARDIOGRAPHY  08/2009   SHOWED A MEAN GRADIENT ACROSS IS AORTIC VALVE OF 24 MM OF MERCURY. HE HAD MODERATE LVH AND NORMAL LV FUNCTION    Current Medications: Current Meds  Medication Sig   acetaminophen (TYLENOL) 500 MG tablet Take by mouth as needed.   allopurinol (ZYLOPRIM) 300  MG tablet Take 300 mg by mouth daily.     amLODipine (NORVASC) 5 MG tablet Take 1 tablet (5 mg total) by mouth daily.   amoxicillin (AMOXIL) 500 MG capsule Take 2,000 mg by mouth See admin instructions. Take 2,000 mg by mouth one hour prior to dental appointments   aspirin EC 81 MG tablet Take 81 mg by mouth at bedtime.    Cholecalciferol (VITAMIN D3) 1000 UNITS CAPS Take 1,000 Units by mouth at bedtime.     famotidine (PEPCID) 20 MG tablet Take 20 mg by mouth daily before breakfast.    furosemide (LASIX) 40 MG tablet Take 1 tablet (40 mg total) by mouth daily.   Multiple Vitamins-Minerals (CENTRUM SILVER) tablet Take 1 tablet by mouth daily with breakfast.    polyethylene glycol powder (GLYCOLAX/MIRALAX) 17 GM/SCOOP powder polyethylene glycol 3350 17 gram oral powder packet  DISSOLVE 1 POWDER & DRINK ONCE DAILY AS NEEDED FOR CONSTIPATION (MAY START WHEN TAKING BY MOUTH) MIX IN 4 8 OUNCES OF FLUID PRIOR TO TAKING   PRESCRIPTION MEDICATION CPAP: At bedtime   ramipril (ALTACE) 5 MG capsule Take 1 capsule (5 mg total) by mouth 2 (two) times daily.   rosuvastatin (CRESTOR) 20 MG tablet Take 10 mg by mouth every evening.   spironolactone (ALDACTONE) 25 MG tablet Take 1 tablet (25 mg total) by mouth daily.   Tamsulosin HCl (FLOMAX) 0.4 MG CAPS Take 0.4 mg by mouth every evening.    temazepam (RESTORIL) 30 MG capsule Take 30 mg by mouth at bedtime as needed for sleep.     Allergies:   Ibuprofen   Social History   Socioeconomic History   Marital status: Married    Spouse name: Not on file   Number of children: Not on file   Years of education: Not on file   Highest education level: Not on file  Occupational History   Not on file  Tobacco Use   Smoking status: Former Smoker    Packs/day: 1.50    Years: 7.00    Pack years: 10.50    Types: Cigarettes    Quit date: 04/03/1971    Years since quitting: 49.3   Smokeless tobacco: Never Used  Vaping Use   Vaping Use: Never used  Substance and Sexual Activity   Alcohol use: Yes    Comment: social   Drug use: No   Sexual activity: Not on file  Other Topics Concern   Not on file  Social History Narrative   Not on file   Social Determinants of Health   Financial Resource Strain:    Difficulty of Paying Living Expenses: Not on file  Food Insecurity:    Worried About Charity fundraiser in the Last Year: Not on file    [employer] of Food in the Last Year: Not on file  Transportation Needs:    Lack of Transportation (Medical): Not on file   Lack of Transportation (Non-Medical): Not on file  Physical Activity:    Days of Exercise per Week: Not on file   Minutes of Exercise per Session: Not on file  Stress:    Feeling of Stress : Not on file  Social Connections:    Frequency of Communication with Friends and Family: Not on file   Frequency of Social Gatherings with Friends and Family: Not on file   Attends Religious Services: Not on file   Active Member of Clubs or Organizations: Not on file   Attends Archivist Meetings: Not  on file   Marital Status: Not on file     Family History: The patient's family history includes Heart attack in his father; Pneumonia (age of onset: 71) in his mother.  ROS:   Please see the history of present illness.    Concerned about imaging of his carotids and lower extremities.  Had a recent bilateral renal Doppler performed that revealed less than 70% stenosis in the renals bilaterally.  There was also a 70 to 99% stenosis in the celiac artery which she was concerned about.  We discussed that in absence of symptoms, no therapy was required because there is in a connections between the celiac, superior mesenteric, and inferior mesenteric arteries.  He was educated about symptoms related to intestinal ischemia including abdominal discomfort and nausea after eating associated with weight loss over time.  All other systems reviewed and are negative.  EKGs/Labs/Other Studies Reviewed:    The following studies were reviewed today:  Bilateral renal Doppler study 07/17/2020: Summary:    Largest Aortic Diameter: 2.3 cm    Renal:    Right: Normal size right kidney. Abnormal right Resistive Index.     Abnormal cortical thickness of right kidney. 1-59% stenosis     of the right renal artery. RRV flow present.  Left: Normal size of left kidney.  Abnormal left Resistive Index.     Abnormal cortical thickness of the left kidney. 1-59%     stenosis of the left renal artery, high end range. LRV flow     present.  Mesenteric:  70 to 99% stenosis in the celiac artery. Unable to duplicate elevated  velocities  of 293 cm/s in the superior mesenteric artery from prior exam.    Patent IVC.   EKG:  EKG no new tracing is performed today.  Recent Labs: 09/10/2019: ALT 19 09/19/2019: TSH 7.600 09/23/2019: B Natriuretic Peptide 581.7 09/27/2019: Hemoglobin 11.8; Magnesium 2.0; Platelets 89 07/19/2020: BUN 28; Creatinine, Ser 1.27; Potassium 4.6; Sodium 137  Recent Lipid Panel    Component Value Date/Time   CHOL 90 04/23/2019 0249   TRIG 79 04/23/2019 0249   HDL 32 (L) 04/23/2019 0249   CHOLHDL 2.8 04/23/2019 0249   VLDL 16 04/23/2019 0249   LDLCALC 42 04/23/2019 0249    Physical Exam:    VS:  BP 120/68    Pulse 65    Ht 5\' 10"  (1.778 m)    Wt 221 lb 6.4 oz (100.4 kg)    SpO2 98%    BMI 31.77 kg/m     Wt Readings from Last 3 Encounters:  07/25/20 221 lb 6.4 oz (100.4 kg)  01/20/20 221 lb (100.2 kg)  10/31/19 215 lb (97.5 kg)     GEN: Overweight. No acute distress HEENT: Normal NECK: No JVD. LYMPHATICS: No lymphadenopathy CARDIAC:  RRR with 2/6 to 3/6 systolic without diastolic murmur. No gallop, or edema. VASCULAR:  Normal Pulses. No bruits. RESPIRATORY:  Clear to auscultation without rales, wheezing or rhonchi  ABDOMEN: Soft, non-tender, non-distended, No pulsatile mass, MUSCULOSKELETAL: No deformity  SKIN: Warm and dry NEUROLOGIC:  Alert and oriented x 3 PSYCHIATRIC:  Normal affect   ASSESSMENT:    1. Essential hypertension   2. Chronic diastolic heart failure ([place])   3. RAS (renal artery stenosis) ([place])   4. Right-sided extracranial carotid artery stenosis   5. Coronary artery disease involving native coronary artery of native heart without angina pectoris   6. History of transcatheter aortic valve  replacement (TAVR)   7. Other  hyperlipidemia   8. Educated about COVID-19 virus infection    PLAN:    In order of problems listed above:  1. Recently increased spironolactone to 25 mg/day.  Will check basic metabolic panel in 3 weeks. 2. No clinical evidence of volume overload. 3. Bilateral less than 70% stenosis.  Blood pressure now much better control with Aldactone added at 25 mg daily. 4. Carotid disease is followed by [facility] [place]. 5. Denies angina.  Secondary prevention discussed. 6. No aortic regurgitation is audible.  Follow clinically. 7. Continue high intensity Lipitor for carotid, renal, coronary, and intestinal atherosclerosis with noncritical obstruction. 8. He is vaccinated and practicing mitigation.  Overall education and awareness concerning primary/secondary risk prevention was discussed in detail: LDL less than 70, hemoglobin A1c less than 7, blood pressure target less than 130/80 mmHg, >150 minutes of moderate aerobic activity per week, avoidance of smoking, weight control (via diet and exercise), and continued surveillance/management of/for obstructive sleep apnea.    Medication Adjustments/Labs and Tests Ordered: Current medicines are reviewed at length with the patient today.  Concerns regarding medicines are outlined above.  Orders Placed This Encounter  Procedures   Basic metabolic panel   EKG [id number]   No orders of the defined types were placed in this encounter.   Patient Instructions  Medication Instructions:  Your physician recommends that you continue on your current medications as directed. Please refer to the Current Medication list given to you today.  *If you need a refill on your cardiac medications before your next appointment, please call your pharmacy*   Lab Work: BMET in 1 month  If you have labs (blood work) drawn today and your tests are completely normal, you will receive your results only  by:  [place] (if you have MyChart) OR  A paper copy in the mail If you have any lab test that is abnormal or we need to change your treatment, we will call you to review the results.   Testing/Procedures: None   Follow-Up: At [facility], you and your health needs are our priority.  As part of our continuing mission to provide you with exceptional heart care, we have created designated Provider Care Teams.  These Care Teams include your primary Cardiologist (physician) and Advanced Practice Providers (APPs -  Physician Assistants and Nurse Practitioners) who all work together to provide you with the care you need, when you need it.  We recommend signing up for the patient portal called "MyChart".  Sign up information is provided on this After Visit Summary.  MyChart is used to connect with patients for Virtual Visits (Telemedicine).  Patients are able to view lab/test results, encounter notes, upcoming appointments, etc.  Non-urgent messages can be sent to your provider as well.   To learn more about what you can do with MyChart, go to [email or web address].    Your next appointment:   12 month(s)  The format for your next appointment:   In Person  Provider:   You may see Sinclair Grooms, MD or one of the following Advanced Practice Providers on your designated Care Team:    Truitt Merle, NP  Cecilie Kicks, NP  Kathyrn Drown, NP    Other Instructions      Signed, Sinclair Grooms, MD  07/25/2020 11:10 AM    [place]

## 2020-07-25 ENCOUNTER — Other Ambulatory Visit: Payer: Self-pay

## 2020-07-25 ENCOUNTER — Encounter: Payer: Self-pay | Admitting: Interventional Cardiology

## 2020-07-25 ENCOUNTER — Ambulatory Visit (INDEPENDENT_AMBULATORY_CARE_PROVIDER_SITE_OTHER): Payer: Medicare Other | Admitting: Interventional Cardiology

## 2020-07-25 VITALS — BP 120/68 | HR 65 | Ht 70.0 in | Wt 221.4 lb

## 2020-07-25 DIAGNOSIS — I10 Essential (primary) hypertension: Secondary | ICD-10-CM | POA: Diagnosis not present

## 2020-07-25 DIAGNOSIS — I6521 Occlusion and stenosis of right carotid artery: Secondary | ICD-10-CM

## 2020-07-25 DIAGNOSIS — I2510 Atherosclerotic heart disease of native coronary artery without angina pectoris: Secondary | ICD-10-CM | POA: Diagnosis not present

## 2020-07-25 DIAGNOSIS — I5032 Chronic diastolic (congestive) heart failure: Secondary | ICD-10-CM | POA: Diagnosis not present

## 2020-07-25 DIAGNOSIS — I701 Atherosclerosis of renal artery: Secondary | ICD-10-CM

## 2020-07-25 DIAGNOSIS — Z7189 Other specified counseling: Secondary | ICD-10-CM

## 2020-07-25 DIAGNOSIS — Z952 Presence of prosthetic heart valve: Secondary | ICD-10-CM | POA: Diagnosis not present

## 2020-07-25 DIAGNOSIS — E7849 Other hyperlipidemia: Secondary | ICD-10-CM | POA: Diagnosis not present

## 2020-07-25 NOTE — Patient Instructions (Signed)
Medication Instructions:  Your physician recommends that you continue on your current medications as directed. Please refer to the Current Medication list given to you today.  *If you need a refill on your cardiac medications before your next appointment, please call your pharmacy*   Lab Work: BMET in 1 month  If you have labs (blood work) drawn today and your tests are completely normal, you will receive your results only by: [place] MyChart Message (if you have MyChart) OR . A paper copy in the mail If you have any lab test that is abnormal or we need to change your treatment, we will call you to review the results.   Testing/Procedures: None   Follow-Up: At [facility], you and your health needs are our priority.  As part of our continuing mission to provide you with exceptional heart care, we have created designated Provider Care Teams.  These Care Teams include your primary Cardiologist (physician) and Advanced Practice Providers (APPs -  Physician Assistants and Nurse Practitioners) who all work together to provide you with the care you need, when you need it.  We recommend signing up for the patient portal called "MyChart".  Sign up information is provided on this After Visit Summary.  MyChart is used to connect with patients for Virtual Visits (Telemedicine).  Patients are able to view lab/test results, encounter notes, upcoming appointments, etc.  Non-urgent messages can be sent to your provider as well.   To learn more about what you can do with MyChart, go to [email or web address].    Your next appointment:   12 month(s)  The format for your next appointment:   In Person  Provider:   You may see Sinclair Grooms, MD or one of the following Advanced Practice Providers on your designated Care Team:    Truitt Merle, NP  Cecilie Kicks, NP  Kathyrn Drown, NP    Other Instructions

## 2020-07-26 DIAGNOSIS — Z950 Presence of cardiac pacemaker: Secondary | ICD-10-CM | POA: Diagnosis not present

## 2020-07-30 DIAGNOSIS — Z950 Presence of cardiac pacemaker: Secondary | ICD-10-CM | POA: Diagnosis not present

## 2020-08-24 ENCOUNTER — Other Ambulatory Visit: Payer: Medicare Other | Admitting: *Deleted

## 2020-08-24 ENCOUNTER — Other Ambulatory Visit: Payer: Self-pay

## 2020-08-24 DIAGNOSIS — I10 Essential (primary) hypertension: Secondary | ICD-10-CM

## 2020-08-24 DIAGNOSIS — I5032 Chronic diastolic (congestive) heart failure: Secondary | ICD-10-CM | POA: Diagnosis not present

## 2020-08-24 DIAGNOSIS — Z23 Encounter for immunization: Secondary | ICD-10-CM | POA: Diagnosis not present

## 2020-08-24 LAB — BASIC METABOLIC PANEL
BUN/Creatinine Ratio: 26 — ABNORMAL HIGH (ref 10–24)
BUN: 25 mg/dL (ref 8–27)
CO2: 25 mmol/L (ref 20–29)
Calcium: 9.4 mg/dL (ref 8.6–10.2)
Chloride: 99 mmol/L (ref 96–106)
Creatinine, Ser: 0.97 mg/dL (ref 0.76–1.27)
GFR calc Af Amer: 84 mL/min/{1.73_m2} (ref >59)
GFR calc non Af Amer: 72 mL/min/{1.73_m2} (ref >59)
Glucose: 90 mg/dL (ref 65–99)
Potassium: 4.2 mmol/L (ref 3.5–5.2)
Sodium: 136 mmol/L (ref 134–144)

## 2020-10-04 DIAGNOSIS — R42 Dizziness and giddiness: Secondary | ICD-10-CM | POA: Diagnosis not present

## 2020-10-04 DIAGNOSIS — Z952 Presence of prosthetic heart valve: Secondary | ICD-10-CM | POA: Diagnosis not present

## 2020-10-07 ENCOUNTER — Other Ambulatory Visit: Payer: Self-pay | Admitting: Nurse Practitioner

## 2020-10-16 DIAGNOSIS — N451 Epididymitis: Secondary | ICD-10-CM | POA: Diagnosis not present

## 2020-10-16 DIAGNOSIS — C61 Malignant neoplasm of prostate: Secondary | ICD-10-CM | POA: Diagnosis not present

## 2020-10-16 DIAGNOSIS — N35011 Post-traumatic bulbous urethral stricture: Secondary | ICD-10-CM | POA: Diagnosis not present

## 2020-10-19 DIAGNOSIS — S8012XA Contusion of left lower leg, initial encounter: Secondary | ICD-10-CM | POA: Diagnosis not present

## 2020-10-24 DIAGNOSIS — Z961 Presence of intraocular lens: Secondary | ICD-10-CM | POA: Diagnosis not present

## 2020-10-24 DIAGNOSIS — Z9841 Cataract extraction status, right eye: Secondary | ICD-10-CM | POA: Diagnosis not present

## 2020-10-24 DIAGNOSIS — H31002 Unspecified chorioretinal scars, left eye: Secondary | ICD-10-CM | POA: Diagnosis not present

## 2020-10-24 DIAGNOSIS — Z9842 Cataract extraction status, left eye: Secondary | ICD-10-CM | POA: Diagnosis not present

## 2020-10-25 DIAGNOSIS — Z950 Presence of cardiac pacemaker: Secondary | ICD-10-CM | POA: Diagnosis not present

## 2020-10-29 NOTE — Progress Notes (Signed)
Subjective:    Patient ID: Andre Miranda, male    DOB: 03-17-1936, 84 y.o.   MRN: [id number]  HPI  M former smoker followed for OSA complicated by CAD/CABG/PAD, aortic stenosis, lung nodules CPAP 10/ [facility]    Had flu vaccine PFT 06/29/2012-mild obstructive airways disease and small airways with response to bronchodilator. Normal lung volumes and diffusion. FEV13.10/106%, FEV1/FVC 0.79, FEF 25-75% 3.04/118% with 72% response to bronchodilator.  ------------------------------------------------------------------------------------   10/31/2019- 84 year old male former smoker followed for OSA complicated by history CAD/CABG/PAD, Aortic stenosis/TAVR, lung nodules/ granulomas, prostate cancer CPAP auto 5-15/ [facility]- compliance 63%, AHI 8.7/ hr Temazepam 30 mg Hosp 11/6-10- Acute CHF, hematochezia. Had cath w  CAD, no intervention. CXR 04/22/2019-  Interstitial pulmonary edema with associated small pleural Effusions. CXR 09/23/19 1V- Persisting reticulonodular opacities at the lung bases with new small pleural effusions, potentially multifocal infection and/or Edema. Dieted off some weight. Was "miserable" midsummer when he missed CPAP. Now says he uses every night, works well and he likes nasal mask.   10/30/20- 84 year old male former smoker followed for OSA complicated by history CAD/CABG/PAD/ pacemaker, Aortic stenosis/TAVR, lung nodules/ granulomas, prostate cancer -Temazepam 30 CPAP auto 5-15/ [facility]/ [facility]/ [facility]/ [facility]" [place] is blank Body weight today- 225 lbs Covid vax- Phizer x 3 Flu vax- Had -----Using CPAP nightly  Respironics machine was recalled but still works ok. Has not heard from DME. We are trying to verify appropriate DME supplier.  He has questions about [facility]- discussed. In past year has had pacemaker, TAVR replaced, cataract surgery. Says he is breathing comfortably. CXR 09/23/2019- 1V- IMPRESSION: Persisting  reticulonodular opacities at the lung bases with new small pleural effusions, potentially multifocal infection and/or edema.  ROS-see HPI + = positive Constitutional:   No-   weight loss, night sweats, fevers, chills, fatigue, lassitude. HEENT:   No-  headaches, difficulty swallowing, tooth/dental problems, sore throat,       No-  sneezing, itching, ear ache, nasal congestion, post nasal drip,  CV:  chest pain,  No-orthopnea, PND, swelling in lower extremities, anasarca,   dizziness, palpitations Resp: No-   shortness of breath with exertion or at rest.             +  productive cough,  + non-productive cough,  No- coughing up of blood.              No-   change in color of mucus.  No- wheezing.   Skin: No-   rash or lesions. GI:  No-   heartburn, indigestion, abdominal pain, nausea, vomiting,  GU:  MS:  No-   joint pain or swelling.  . Neuro-     nothing unusual Psych:  No- change in mood or affect. No depression or anxiety.  No memory loss.  Objective:   Physical Exam General- Alert, Oriented, Affect-appropriate, Distress- none acute  + Obese Skin- rash-none, lesions- none, excoriation- none Lymphadenopathy- none Head- atraumatic Eyes- Gross vision intact, PERRLA, conjunctivae clear, secretions Ears- +Hearing aids Nose- Clear, No-Septal dev, mucus, polyps, erosion, perforation Mild external deviation Throat- Mallampati II-III , mucosa clear , drainage- none, tonsils- atrophic Neck- flexible , trachea midline, no stridor , thyroid nl, carotid no bruit   Thick neck Chest - symmetrical excursion , unlabored  Heart/CV- RRR , murmur-none heard , no gallop  , no rub, nl s1 s2  - JVD- none , edema- none, stasis changes- none, varices- none  Lung- clear to P&A,  wheeze- none, cough- none, dullness-none, rub- none   Chest wall- Abd-  Br/ Gen/ Rectal- Not done, not indicated Extrem- cyanosis- none, clubbing, none, atrophy- none, strength- nl Neuro- grossly intact to  observation  Assessment & Plan:

## 2020-10-30 ENCOUNTER — Other Ambulatory Visit: Payer: Self-pay

## 2020-10-30 ENCOUNTER — Encounter: Payer: Self-pay | Admitting: Internal Medicine

## 2020-10-30 ENCOUNTER — Ambulatory Visit (INDEPENDENT_AMBULATORY_CARE_PROVIDER_SITE_OTHER): Payer: Medicare Other | Admitting: Internal Medicine

## 2020-10-30 ENCOUNTER — Telehealth: Payer: Self-pay | Admitting: Internal Medicine

## 2020-10-30 VITALS — BP 132/72 | HR 69 | Ht 70.0 in | Wt 225.0 lb

## 2020-10-30 DIAGNOSIS — I701 Atherosclerosis of renal artery: Secondary | ICD-10-CM

## 2020-10-30 DIAGNOSIS — G4733 Obstructive sleep apnea (adult) (pediatric): Secondary | ICD-10-CM | POA: Diagnosis not present

## 2020-10-30 DIAGNOSIS — M79605 Pain in left leg: Secondary | ICD-10-CM | POA: Diagnosis not present

## 2020-10-30 DIAGNOSIS — I5023 Acute on chronic systolic (congestive) heart failure: Secondary | ICD-10-CM | POA: Diagnosis not present

## 2020-10-30 NOTE — Assessment & Plan Note (Signed)
Benefits from CPAP and compliant. We will try to reach appropriate DME ([facility]/ [facility], [facility]) to get machine replaced auto 5-15

## 2020-10-30 NOTE — Assessment & Plan Note (Signed)
Not in over CHF at this visit. He will continue to follow with cardiology.

## 2020-10-30 NOTE — Telephone Encounter (Signed)
Called and spoke with pt letting him know that we had received the info from him about DME he uses for his cpap and pt verbalized understanding. Routing to [facility] as order was placed at pt's OV today 12/14.

## 2020-10-30 NOTE — Patient Instructions (Signed)
Order DME [facility] - please replace old CPAP machine auto 5-15, mask of choice, humidifier, supplies, [employer]/ card  Please call if we can help

## 2020-11-20 DIAGNOSIS — N390 Urinary tract infection, site not specified: Secondary | ICD-10-CM | POA: Diagnosis not present

## 2020-11-26 ENCOUNTER — Other Ambulatory Visit: Payer: Self-pay

## 2020-11-26 MED ORDER — FUROSEMIDE 40 MG PO TABS: 40.0000 mg | ORAL_TABLET | Freq: Every day | ORAL | 2 refills | Status: DC

## 2021-01-24 DIAGNOSIS — Z950 Presence of cardiac pacemaker: Secondary | ICD-10-CM | POA: Diagnosis not present

## 2021-01-29 DIAGNOSIS — N35011 Post-traumatic bulbous urethral stricture: Secondary | ICD-10-CM | POA: Diagnosis not present

## 2021-01-29 DIAGNOSIS — N5082 Scrotal pain: Secondary | ICD-10-CM | POA: Diagnosis not present

## 2021-01-29 DIAGNOSIS — C61 Malignant neoplasm of prostate: Secondary | ICD-10-CM | POA: Diagnosis not present

## 2021-01-30 DIAGNOSIS — D2261 Melanocytic nevi of right upper limb, including shoulder: Secondary | ICD-10-CM | POA: Diagnosis not present

## 2021-01-30 DIAGNOSIS — L821 Other seborrheic keratosis: Secondary | ICD-10-CM | POA: Diagnosis not present

## 2021-01-30 DIAGNOSIS — D2272 Melanocytic nevi of left lower limb, including hip: Secondary | ICD-10-CM | POA: Diagnosis not present

## 2021-01-30 DIAGNOSIS — D225 Melanocytic nevi of trunk: Secondary | ICD-10-CM | POA: Diagnosis not present

## 2021-01-30 DIAGNOSIS — L720 Epidermal cyst: Secondary | ICD-10-CM | POA: Diagnosis not present

## 2021-01-30 DIAGNOSIS — Z85828 Personal history of other malignant neoplasm of skin: Secondary | ICD-10-CM | POA: Diagnosis not present

## 2021-02-06 NOTE — Telephone Encounter (Signed)
Is he wearing CPAP every night for OSA? Is he adhering to 2.4 gram sodium diet? Keep monitoring BP

## 2021-02-14 DIAGNOSIS — I509 Heart failure, unspecified: Secondary | ICD-10-CM | POA: Diagnosis not present

## 2021-02-14 DIAGNOSIS — I10 Essential (primary) hypertension: Secondary | ICD-10-CM | POA: Diagnosis not present

## 2021-02-14 DIAGNOSIS — G4700 Insomnia, unspecified: Secondary | ICD-10-CM | POA: Diagnosis not present

## 2021-02-14 DIAGNOSIS — K219 Gastro-esophageal reflux disease without esophagitis: Secondary | ICD-10-CM | POA: Diagnosis not present

## 2021-02-14 DIAGNOSIS — I110 Hypertensive heart disease with heart failure: Secondary | ICD-10-CM | POA: Diagnosis not present

## 2021-02-14 DIAGNOSIS — E785 Hyperlipidemia, unspecified: Secondary | ICD-10-CM | POA: Diagnosis not present

## 2021-02-14 DIAGNOSIS — M109 Gout, unspecified: Secondary | ICD-10-CM | POA: Diagnosis not present

## 2021-02-20 DIAGNOSIS — U071 COVID-19: Secondary | ICD-10-CM | POA: Diagnosis not present

## 2021-02-23 DIAGNOSIS — Z950 Presence of cardiac pacemaker: Secondary | ICD-10-CM | POA: Diagnosis not present

## 2021-02-26 ENCOUNTER — Telehealth: Payer: Self-pay

## 2021-02-26 NOTE — Telephone Encounter (Signed)
Andre Hurst "Bill" to Andre Crome, MD    02/26/21 2:50 PM Nurse Marveen Reeks - - would you please give me a call at [phone] when you have time.  I have a couple of questions concerning continuing symptoms  - i.e. - lightheadedness and  pain & tightness in chest.  Blood pressure numbers have been excellent during past week.  Thank you.   Outreach made to Pt.  Per Pt he would like Dr. Thompson Caul nurse to give him a call.  He said it is not urgent, but just continuous and since she was aware he would like to talk to her.  Pt thanked this nurse for checking in.

## 2021-02-27 NOTE — Telephone Encounter (Signed)
Pt states he has been having intermittent CP/tightness for some time now but has worsened more recently.  Comes on with minimal exertion and resolves with rest.  Also becomes lightheaded with position changes and exertion.  Resolves with rest.  Dropped off BPs last week.  Pt states BP has actually improved over the last week since he dropped off the previous readings.  Scheduled pt to come in to see Dr. Tamala Julian tomorrow for evaluation and advised him to bring his new readings with him.  Pt agreeable to plan.

## 2021-02-28 ENCOUNTER — Encounter (HOSPITAL_COMMUNITY): Payer: Self-pay | Admitting: *Deleted

## 2021-02-28 ENCOUNTER — Encounter: Payer: Self-pay | Admitting: Interventional Cardiology

## 2021-02-28 ENCOUNTER — Encounter: Payer: Self-pay | Admitting: *Deleted

## 2021-02-28 ENCOUNTER — Ambulatory Visit (INDEPENDENT_AMBULATORY_CARE_PROVIDER_SITE_OTHER): Payer: Medicare Other | Admitting: Interventional Cardiology

## 2021-02-28 ENCOUNTER — Other Ambulatory Visit: Payer: Self-pay

## 2021-02-28 VITALS — BP 100/50 | HR 70 | Ht 70.0 in | Wt 226.0 lb

## 2021-02-28 DIAGNOSIS — R0789 Other chest pain: Secondary | ICD-10-CM | POA: Diagnosis not present

## 2021-02-28 DIAGNOSIS — I10 Essential (primary) hypertension: Secondary | ICD-10-CM | POA: Diagnosis not present

## 2021-02-28 DIAGNOSIS — I701 Atherosclerosis of renal artery: Secondary | ICD-10-CM

## 2021-02-28 DIAGNOSIS — I6523 Occlusion and stenosis of bilateral carotid arteries: Secondary | ICD-10-CM

## 2021-02-28 DIAGNOSIS — I2510 Atherosclerotic heart disease of native coronary artery without angina pectoris: Secondary | ICD-10-CM

## 2021-02-28 DIAGNOSIS — E7849 Other hyperlipidemia: Secondary | ICD-10-CM | POA: Diagnosis not present

## 2021-02-28 DIAGNOSIS — I5032 Chronic diastolic (congestive) heart failure: Secondary | ICD-10-CM

## 2021-02-28 MED ORDER — NITROGLYCERIN 0.4 MG SL SUBL: 0.4000 mg | SUBLINGUAL_TABLET | SUBLINGUAL | 3 refills | Status: DC | PRN

## 2021-02-28 MED ORDER — ISOSORBIDE MONONITRATE ER 30 MG PO TB24: 30.0000 mg | ORAL_TABLET | Freq: Every day | ORAL | 3 refills | Status: DC

## 2021-02-28 NOTE — Progress Notes (Signed)
Cardiology Office Note:    Date:  02/28/2021   ID:  Andre Miranda, DOB 10/06/1936, MRN [id number]  PCP:  Crist Infante, MD  Cardiologist:  Sinclair Grooms, MD   Referring MD: Crist Infante, MD   Chief Complaint  Patient presents with  . Congestive Heart Failure  . Coronary Artery Disease    Chest pain    History of Present Illness:    Andre Miranda is a 85 y.o. male with a hx of CAD with prior CABG in 2003, [id number], subsequent severe aortic regurgitation --> repeat TAVR prosthesis 10/12/2019, hyperlipidemia,priordifficult to controlHTN, and chronic diastolic HF.  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  Acute on chronic diastolic heart failure resolved after repeat TAVR.  4 to 8-month history of exertional angina, mild.  Over the past 2 to 3 weeks, discomfort seems to occur with less physical activity, but resolves with rest promptly.  No episodes of rest discomfort.  He has not tried sublingual nitroglycerin.  He did have an episode of orthopnea about 2 weeks ago.  This resolved on its own.  Past Medical History:  Diagnosis Date  . Aortic stenosis    MODERATE  . Cancer [facility])    prostate cancer-radiation  . Carotid arterial disease ([place])   . Coronary artery disease   . Dysuria   . GERD (gastroesophageal reflux disease)   . Gout   . Hematuria   . History of urinary retention   . Hyperlipidemia   . Hypertension   . Obesity   . Sleep apnea    uses CPAP nightly    Past Surgical History:  Procedure Laterality Date  . AORTIC VALVE REPAIR  February 06, 2015   Dr. Aline Brochure and Dr. Ysidro Evert  at [facility]  . BIOPSY  09/26/2019   Procedure: BIOPSY;  Surgeon: Otis Brace, MD;  Location: [facility] ENDOSCOPY;  Service: Gastroenterology;;  . CAROTID ENDARTERECTOMY  02/2008  . CHOLECYSTECTOMY  04/2002  . COLONOSCOPY WITH PROPOFOL Left 09/26/2019   Procedure: COLONOSCOPY WITH PROPOFOL;  Surgeon: Otis Brace, MD;   Location: [place];  Service: Gastroenterology;  Laterality: Left;  . CORONARY ARTERY BYPASS GRAFT  2003   LIMA TO THE LAD, RIMA TO THE RCA, AND A SAPHENOUS VEIN GRAFT TO THE INTERMEDIATE AND DISTAL LEFT CIRCUMFLEX  . CORONARY/GRAFT ANGIOGRAPHY N/A 09/27/2019   Procedure: CORONARY/GRAFT ANGIOGRAPHY;  Surgeon: Belva Crome, MD;  Location: [place] CV LAB;  Service: Cardiovascular;  Laterality: N/A;  . ESOPHAGOGASTRODUODENOSCOPY (EGD) WITH PROPOFOL Left 09/26/2019   Procedure: ESOPHAGOGASTRODUODENOSCOPY (EGD) WITH PROPOFOL;  Surgeon: Otis Brace, MD;  Location: [facility] ENDOSCOPY;  Service: Gastroenterology;  Laterality: Left;  . ORIF ANKLE FRACTURE Right 02/14/2019   Procedure: OPEN REDUCTION INTERNAL FIXATION (ORIF) ANKLE FRACTURE;  Surgeon: Netta Cedars, MD;  Location: [place];  Service: Orthopedics;  Laterality: Right;  . POLYPECTOMY  09/26/2019   Procedure: POLYPECTOMY;  Surgeon: Otis Brace, MD;  Location: [place] ENDOSCOPY;  Service: Gastroenterology;;  . US ECHOCARDIOGRAPHY  08/2009   SHOWED A MEAN GRADIENT ACROSS IS AORTIC VALVE OF 24 MM OF MERCURY. HE HAD MODERATE LVH AND NORMAL LV FUNCTION    Current Medications: No outpatient medications have been marked as taking for the 02/28/21 encounter (Office Visit) with Belva Crome, MD.     Allergies:   Ibuprofen   Social History   Socioeconomic History  . Marital status: Married    Spouse name: Not on file  . Number  of children: Not on file  . Years of education: Not on file  . Highest education level: Not on file  Occupational History  . Not on file  Tobacco Use  . Smoking status: Former Smoker    Packs/day: 1.50    Years: 7.00    Pack years: 10.50    Types: Cigarettes    Quit date: 04/03/1971    Years since quitting: 49.9  . Smokeless tobacco: Never Used  Vaping Use  . Vaping Use: Never used  Substance and Sexual Activity  . Alcohol use: Yes    Comment: social  . Drug use: No  . Sexual  activity: Not on file  Other Topics Concern  . Not on file  Social History Narrative  . Not on file   Social Determinants of Health   Financial Resource Strain: Not on file  Food Insecurity: Not on file  Transportation Needs: Not on file  Physical Activity: Not on file  Stress: Not on file  Social Connections: Not on file     Family History: The patient's family history includes Heart attack in his father; Pneumonia (age of onset: 76) in his mother.  ROS:   Please see the history of present illness.    Taken medications as listed.  Prior charts of blood pressure readings and tend to run between 130 and 098 mmHg systolic and less than 80 m mercury diastolic.  All other systems reviewed and are negative.  EKGs/Labs/Other Studies Reviewed:    The following studies were reviewed today:  Coronary angiography 09/27/2019: Conclusion   The bioprosthetic TAVR valve was not crossed.  The valve partially obstructs the ostium of the left main.  Widely patent LIMA to the mid to distal LAD followed by diffuse 75 to 85% LAD atherosclerosis over a very long segment.  Not amenable to PCI.  Widely patent RIMA to distal RCA.  The very distal RCA contains diffuse disease without focal high-grade obstruction.  Widely patent sequential saphenous vein graft to the ramus intermedius and the distal circumflex.  There is 30% stenosis in the proximal body of the graft.  Total occlusion of the native right coronary in its proximal segment.  Total occlusion of the native circumflex and ramus intermedius near the left main.  Total occlusion of the proximal to mid LAD after the first septal perforator.    EKG:  EKG right bundle, first-degree AV block, and when compared to 07/25/2020, the PR duration is less severe.  Recent Labs: 08/24/2020: BUN 25; Creatinine, Ser 0.97; Potassium 4.2; Sodium 136  Recent Lipid Panel    Component Value Date/Time   CHOL 90 04/23/2019 0249   TRIG 79 04/23/2019 0249    HDL 32 (L) 04/23/2019 0249   CHOLHDL 2.8 04/23/2019 0249   VLDL 16 04/23/2019 0249   LDLCALC 42 04/23/2019 0249    Physical Exam:    VS:  Ht 5\' 10"  (1.778 m)   BMI 32.28 kg/m     Wt Readings from Last 3 Encounters:  10/30/20 225 lb (102.1 kg)  07/25/20 221 lb 6.4 oz (100.4 kg)  01/20/20 221 lb (100.2 kg)     GEN: Obese. No acute distress HEENT: Normal NECK: No JVD. LYMPHATICS: No lymphadenopathy CARDIAC: 3/6 right upper sternal crescendo decrescendo systolic but no diastolic murmur. RRR is 4 gallop, or edema. VASCULAR:  Normal Pulses. No bruits. RESPIRATORY:  Clear to auscultation without rales, wheezing or rhonchi  ABDOMEN: Soft, non-tender, non-distended, No pulsatile mass, MUSCULOSKELETAL: No deformity  SKIN: Warm  and dry NEUROLOGIC:  Alert and oriented x 3 PSYCHIATRIC:  Normal affect   ASSESSMENT:    1. Coronary artery disease involving native coronary artery of native heart without angina pectoris   2. Chest discomfort   3. RAS (renal artery stenosis) ([facility])   4. Essential hypertension   5. Chronic diastolic heart failure (HCC)   6. Other hyperlipidemia   7. Bilateral carotid artery occlusion    PLAN:    In order of problems listed above:  1. Angina pectoris.  Cath in 2000 demonstrated distal RCA diffuse disease.  We will get a myocardial perfusion imaging study to rule out large area of ischemia.  Start isosorbide mononitrate 30 mg/day.  Use sublingual nitroglycerin as needed for angina.  Reconvene in 4 weeks. 2. Use sublingual nitroglycerin if chest discomfort and report to [place]. 3. Blood pressure is well controlled. 4. Blood pressure is well controlled. 5. No evidence of volume overload on today's exam. 6. Continue high intensity statin therapy. 7. No neurological complaints.   Medication Adjustments/Labs and Tests Ordered: Current medicines are reviewed at length with the patient today.  Concerns regarding medicines are outlined above.  No orders of  the defined types were placed in this encounter.  No orders of the defined types were placed in this encounter.   There are no Patient Instructions on file for this visit.   Signed, Sinclair Grooms, MD  02/28/2021 9:11 AM    [place]

## 2021-02-28 NOTE — Patient Instructions (Addendum)
Medication Instructions:  1) START Isosorbide 30mg  once daily  *If you need a refill on your cardiac medications before your next appointment, please call your pharmacy*   Lab Work: None If you have labs (blood work) drawn today and your tests are completely normal, you will receive your results only by: Marland Kitchen MyChart Message (if you have MyChart) OR . A paper copy in the mail If you have any lab test that is abnormal or we need to change your treatment, we will call you to review the results.   Testing/Procedures: Your physician has requested that you have a lexiscan myoview. For further information please visit [email or web address]. Please follow instruction sheet, as given.   Follow-Up: At [facility], you and your health needs are our priority.  As part of our continuing mission to provide you with exceptional heart care, we have created designated Provider Care Teams.  These Care Teams include your primary Cardiologist (physician) and Advanced Practice Providers (APPs -  Physician Assistants and Nurse Practitioners) who all work together to provide you with the care you need, when you need it.  We recommend signing up for the patient portal called "MyChart".  Sign up information is provided on this After Visit Summary.  MyChart is used to connect with patients for Virtual Visits (Telemedicine).  Patients are able to view lab/test results, encounter notes, upcoming appointments, etc.  Non-urgent messages can be sent to your provider as well.   To learn more about what you can do with MyChart, go to [email or web address].    Your next appointment:   4 week(s)- needs to be after Lexiscan.  Can use 5/16 at 9am or 1:40pm.   The format for your next appointment:   In Person  Provider:   You may see Sinclair Grooms, MD or one of the following Advanced Practice Providers on your designated Care Team:    Kathyrn Drown, NP    Other Instructions

## 2021-03-05 ENCOUNTER — Telehealth (HOSPITAL_COMMUNITY): Payer: Self-pay | Admitting: *Deleted

## 2021-03-05 NOTE — Addendum Note (Signed)
Addended by: Belva Crome on: 03/05/2021 08:28 AM   Modules accepted: Orders

## 2021-03-05 NOTE — Telephone Encounter (Signed)
Patient given detailed instructions per Myocardial Perfusion Study Information Sheet for the test on 03/06/21 at 1045. Patient notified to arrive 15 minutes early and that it is imperative to arrive on time for appointment to keep from having the test rescheduled.  If you need to cancel or reschedule your appointment, please call the office within 24 hours of your appointment. . Patient verbalized understanding.Andre Miranda, Andre Miranda

## 2021-03-05 NOTE — Addendum Note (Signed)
Addended by: Loren Racer on: 03/05/2021 07:46 AM   Modules accepted: Orders

## 2021-03-06 ENCOUNTER — Other Ambulatory Visit: Payer: Self-pay

## 2021-03-06 ENCOUNTER — Ambulatory Visit (HOSPITAL_COMMUNITY): Payer: Medicare Other | Attending: Cardiovascular Disease

## 2021-03-06 DIAGNOSIS — I2510 Atherosclerotic heart disease of native coronary artery without angina pectoris: Secondary | ICD-10-CM | POA: Insufficient documentation

## 2021-03-06 DIAGNOSIS — R0789 Other chest pain: Secondary | ICD-10-CM | POA: Insufficient documentation

## 2021-03-06 LAB — MYOCARDIAL PERFUSION IMAGING
LV dias vol: 120 mL (ref 62–150)
LV sys vol: 54 mL
Peak HR: 71 {beats}/min
Rest HR: 64 {beats}/min
SDS: 7
SRS: 3
SSS: 11
TID: 1.03

## 2021-03-06 MED ORDER — TECHNETIUM TC 99M TETROFOSMIN IV KIT
9.6000 | PACK | Freq: Once | INTRAVENOUS | Status: AC | PRN
  Administered 2021-03-06: 9.6 via INTRAVENOUS
  Filled 2021-03-06: qty 10

## 2021-03-06 MED ORDER — TECHNETIUM TC 99M TETROFOSMIN IV KIT
31.4000 | PACK | Freq: Once | INTRAVENOUS | Status: AC | PRN
Start: 2021-03-06 — End: 2021-03-06
  Administered 2021-03-06: 31.4 via INTRAVENOUS
  Filled 2021-03-06: qty 32

## 2021-03-06 MED ORDER — REGADENOSON 0.4 MG/5ML IV SOLN
0.4000 mg | Freq: Once | INTRAVENOUS | Status: AC
  Administered 2021-03-06: 0.4 mg via INTRAVENOUS

## 2021-03-16 DIAGNOSIS — I110 Hypertensive heart disease with heart failure: Secondary | ICD-10-CM | POA: Diagnosis not present

## 2021-03-16 DIAGNOSIS — E785 Hyperlipidemia, unspecified: Secondary | ICD-10-CM | POA: Diagnosis not present

## 2021-03-16 DIAGNOSIS — I509 Heart failure, unspecified: Secondary | ICD-10-CM | POA: Diagnosis not present

## 2021-03-31 NOTE — H&P (View-Only) (Signed)
Cardiology Office Note:    Date:  04/01/2021   ID:  Andre Miranda, DOB 08/08/1936, MRN [id number]  PCP:  Crist Infante, MD  Cardiologist:  Sinclair Grooms, MD   Referring MD: Crist Infante, MD   No chief complaint on file.   History of Present Illness:    Andre Miranda is a 85 y.o. 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 Acute on chronic diastolic heart failure resolved after repeat TAVR.  His wife.  Despite adding isosorbide he continues to have angina that disrupts daily activities.  Walking to the mailbox causes chest tightness.  Nitroglycerin helps relieve the discomfort.  It does not occur at rest.  He wonders if this could be gastrointestinal because it causes him to burp.  He has not had prolonged pain unrelieved by nitroglycerin.  At the last office visit up titration of medical therapy was attempted by adding isosorbide mononitrate 30 mg/day.  A nuclear perfusion study was performed and is outlined below.  It was low risk in appearance.  There could be matched ischemia.  Symptoms certainly are slowly progressive over time.  I reviewed the most recent coronary angiogram from 2020.  He had diffuse LAD disease beyond the LIMA insertion site.  The LIMA was widely patent.  The last couple angiograms demonstrated that the [place] was patent but could never be selectively engaged from the femoral approach.  Therefore the distal right coronary bed has not been fully investigated.  Saphenous vein graft to the diagonal was widely patent but did have nonobstructive disease.  The native right and left coronaries were totally occluded.  Past Medical History:  Diagnosis Date  . Aortic stenosis    MODERATE  .  Cancer [facility])    prostate cancer-radiation  . Carotid arterial disease ([place])   . Coronary artery disease   . Dysuria   . GERD (gastroesophageal reflux disease)   . Gout   . Hematuria   . History of urinary retention   . Hyperlipidemia   . Hypertension   . Obesity   . Sleep apnea    uses CPAP nightly    Past Surgical History:  Procedure Laterality Date  . AORTIC VALVE REPAIR  February 06, 2015   Dr. Aline Brochure and Dr. Ysidro Evert  at [facility]  . BIOPSY  09/26/2019   Procedure: BIOPSY;  Surgeon: Otis Brace, MD;  Location: [facility] ENDOSCOPY;  Service: Gastroenterology;;  . CAROTID ENDARTERECTOMY  02/2008  . CHOLECYSTECTOMY  04/2002  . COLONOSCOPY WITH PROPOFOL Left 09/26/2019   Procedure: COLONOSCOPY WITH PROPOFOL;  Surgeon: Otis Brace, MD;  Location: [place];  Service: Gastroenterology;  Laterality: Left;  . CORONARY ARTERY BYPASS GRAFT  2003   LIMA TO THE LAD, RIMA TO THE RCA, AND A SAPHENOUS VEIN GRAFT TO THE INTERMEDIATE AND DISTAL LEFT CIRCUMFLEX  . CORONARY/GRAFT ANGIOGRAPHY N/A 09/27/2019   Procedure: CORONARY/GRAFT ANGIOGRAPHY;  Surgeon: Belva Crome, MD;  Location: [place] CV LAB;  Service: Cardiovascular;  Laterality: N/A;  . ESOPHAGOGASTRODUODENOSCOPY (EGD) WITH PROPOFOL Left 09/26/2019   Procedure: ESOPHAGOGASTRODUODENOSCOPY (EGD) WITH PROPOFOL;  Surgeon: Otis Brace, MD;  Location: [facility] ENDOSCOPY;  Service: Gastroenterology;  Laterality: Left;  . ORIF ANKLE FRACTURE Right 02/14/2019   Procedure: OPEN REDUCTION INTERNAL FIXATION (ORIF) ANKLE FRACTURE;  Surgeon: Netta Cedars, MD;  Location: [place];  Service: Orthopedics;  Laterality: Right;  . POLYPECTOMY  09/26/2019   Procedure: POLYPECTOMY;  Surgeon: Otis Brace, MD;  Location: [place] ENDOSCOPY;  Service: Gastroenterology;;  . US ECHOCARDIOGRAPHY  08/2009   SHOWED A MEAN GRADIENT ACROSS IS AORTIC VALVE OF 24 MM OF MERCURY. HE HAD MODERATE LVH AND NORMAL LV FUNCTION    Current  Medications: Current Meds  Medication Sig  . acetaminophen (TYLENOL) 500 MG tablet Take by mouth as needed.  Marland Kitchen allopurinol (ZYLOPRIM) 300 MG tablet Take 300 mg by mouth daily.  Marland Kitchen amLODipine (NORVASC) 5 MG tablet Take 1 tablet by mouth once daily  . amoxicillin (AMOXIL) 500 MG capsule Take 2,000 mg by mouth See admin instructions. Take 2,000 mg by mouth one hour prior to dental appointments  . aspirin EC 81 MG tablet Take 81 mg by mouth at bedtime.   . Cholecalciferol (VITAMIN D3) 1000 UNITS CAPS Take 1,000 Units by mouth at bedtime.  . famotidine (PEPCID) 20 MG tablet Take 20 mg by mouth daily before breakfast.  . furosemide (LASIX) 40 MG tablet Take 1 tablet (40 mg total) by mouth daily.  . isosorbide mononitrate (IMDUR) 60 MG 24 hr tablet Take 1 tablet (60 mg total) by mouth daily.  . Multiple Vitamins-Minerals (CENTRUM SILVER) tablet Take 1 tablet by mouth daily with breakfast.  . nitroGLYCERIN (NITROSTAT) 0.4 MG SL tablet Place 1 tablet (0.4 mg total) under the tongue every 5 (five) minutes as needed for chest pain.  . polyethylene glycol powder (GLYCOLAX/MIRALAX) 17 GM/SCOOP powder polyethylene glycol 3350 17 gram oral powder packet  DISSOLVE 1 POWDER & DRINK ONCE DAILY AS NEEDED FOR CONSTIPATION (MAY START WHEN TAKING BY MOUTH) MIX IN 4 8 OUNCES OF FLUID PRIOR TO TAKING  . PRESCRIPTION MEDICATION CPAP: At bedtime  . rosuvastatin (CRESTOR) 20 MG tablet Take 10 mg by mouth every evening.  Marland Kitchen spironolactone (ALDACTONE) 25 MG tablet Take 1 tablet (25 mg total) by mouth daily.  . Tamsulosin HCl (FLOMAX) 0.4 MG CAPS Take 0.4 mg by mouth every evening.  . [DISCONTINUED] isosorbide mononitrate (IMDUR) 30 MG 24 hr tablet Take 1 tablet (30 mg total) by mouth daily.     Allergies:   Ibuprofen   Social History   Socioeconomic History  . Marital status: Married    Spouse name: Not on file  . Number of children: Not on file  . Years of education: Not on file  . Highest education level: Not on  file  Occupational History  . Not on file  Tobacco Use  . Smoking status: Former Smoker    Packs/day: 1.50    Years: 7.00    Pack years: 10.50    Types: Cigarettes    Quit date: 04/03/1971    Years since quitting: 50.0  . Smokeless tobacco: Never Used  Vaping Use  . Vaping Use: Never used  Substance and Sexual Activity  . Alcohol use: Yes    Comment: social  . Drug use: No  . Sexual activity: Not on file  Other Topics Concern  . Not on file  Social History Narrative  . Not on file   Social Determinants of Health   Financial Resource Strain: Not on file  Food Insecurity: Not on file  Transportation Needs: Not on file  Physical Activity: Not on file  Stress: Not on file  Social Connections: Not on file     Family History: The patient's family history includes Heart attack in his father; Pneumonia (  age of onset: 72) in his mother.  ROS:   Please see the history of present illness.    Notes that his blood pressure fluctuates quite a bit at home.  He wonders if his valve in valve TAVR could be causing some of his symptoms.  He has kept a precise log of blood pressures and heart rates.  Day-to-day is well within normal range with blood pressures ranging between 120 and [id number] mmHg systolic.  The average blood pressures appear to be around 135/70 mmHg.  All other systems reviewed and are negative.  EKGs/Labs/Other Studies Reviewed:    The following studies were reviewed today:  NUCLEAR IMAGING 03/06/2021: Study Highlights    Nuclear stress EF: 55%.  The left ventricular ejection fraction is normal (55-65%).  There was no ST segment deviation noted during stress.  The study is normal.  This is a low risk study.   Normal resting and stress perfusion. No ischemia or infarction EF 55%   EKG:  EKG performed today demonstrates right bundle, sinus rhythm with first-degree AV block 214 ms, and no acute ST-T wave change.  When compared to February 28, 2021, no significant  noted.  Recent Labs: 08/24/2020: BUN 25; Creatinine, Ser 0.97; Potassium 4.2; Sodium 136  Recent Lipid Panel    Component Value Date/Time   CHOL 90 04/23/2019 0249   TRIG 79 04/23/2019 0249   HDL 32 (L) 04/23/2019 0249   CHOLHDL 2.8 04/23/2019 0249   VLDL 16 04/23/2019 0249   LDLCALC 42 04/23/2019 0249    Physical Exam:    VS:  BP (!) 94/58   Pulse 77   Ht 5\' 10"  (1.778 m)   Wt 228 lb 3.2 oz (103.5 kg)   SpO2 98%   BMI 32.74 kg/m     Wt Readings from Last 3 Encounters:  04/01/21 228 lb 3.2 oz (103.5 kg)  03/06/21 226 lb (102.5 kg)  02/28/21 226 lb (102.5 kg)     GEN: Obese.  No acute distress..  Skin color is normal. HEENT: Normal NECK: No JVD. LYMPHATICS: No lymphadenopathy CARDIAC: 2-3 over 6 low pitched crescendo decrescendo systolic right upper sternal and left mid sternal murmur related to valve in valve TAVR.Marland Kitchen RRR no gallop, or edema. VASCULAR:  Normal Pulses. No bruits. RESPIRATORY:  Clear to auscultation without rales, wheezing or rhonchi  ABDOMEN: Soft, non-tender, non-distended, No pulsatile mass, MUSCULOSKELETAL: No deformity  SKIN: Warm and dry NEUROLOGIC:  Alert and oriented x 3 PSYCHIATRIC:  Normal affect   ASSESSMENT:    1. Coronary artery disease involving native coronary artery of native heart without angina pectoris   2. RAS (renal artery stenosis) ([place])   3. Essential hypertension   4. Chronic diastolic heart failure (HCC)   5. Other hyperlipidemia   6. Bilateral carotid artery occlusion   7. History of transcatheter aortic valve replacement (TAVR)   8. Angina pectoris ([place])    PLAN:    In order of problems listed above:  1. Despite a low risk myocardial perfusion study (suspicious for balanced ischemia) we will proceed with coronary angiography to define anatomy and determine if an interventional approach can improve anginal status.  Described the diffuse nature of disease in the LAD off of the left internal mammary.  This disease is  heavily calcified and is not approachable by stenting as the LAD is very small and diffusely diseased.  We have never seen the distal right coronary bed from the right internal mammary graft because selective engagement has  not been possible.  We will plan this procedure from the right radial approach and may forego performing left radial approach or femoral approach to look at LIMA.  Procedure and risks were discussed with the patient.  With reference to angina, we will go ahead and increase isosorbide mononitrate to 60 mg/day, decrease Altace to 5 mg in the evening rather than twice daily dosing, and have encouraged patient to use nitroglycerin as needed to control symptoms.  Also cautioned the patient to stay under the anginal threshold. 2. No specific comments about renal artery stenosis. 3. Initial blood pressure today was relatively low at 94/56 but upon repeat blood pressure while sitting it was 110/60. 4. No evidence of volume overload. 5. Continue high intensity statin therapy. 6. Continue secondary preventive measures. 7. Has valve in valve TAVR. 8. Coronary angiography is recommended: The patient was counseled to undergo left heart catheterization, coronary angiography, and possible percutaneous coronary intervention with stent implantation via the right radial approach. The procedural risks and benefits were discussed in detail. The risks discussed included death, stroke, myocardial infarction, life-threatening bleeding, limb ischemia, kidney injury, allergy, and possible emergency cardiac surgery. The risk of these significant complications were estimated to occur less than 1% of the time. After discussion, the patient has agreed to proceed.     Medication Adjustments/Labs and Tests Ordered: Current medicines are reviewed at length with the patient today.  Concerns regarding medicines are outlined above.  Orders Placed This Encounter  Procedures  . Basic metabolic panel  . CBC  . EKG  12-Lead   Meds ordered this encounter  Medications  . ramipril (ALTACE) 5 MG capsule    Sig: Take 1 capsule (5 mg total) by mouth at bedtime.    Dispense:  90 capsule    Refill:  3    Dose change  . isosorbide mononitrate (IMDUR) 60 MG 24 hr tablet    Sig: Take 1 tablet (60 mg total) by mouth daily.    Dispense:  90 tablet    Refill:  3    Dose change    Patient Instructions  Medication Instructions:  1) DECREASE Ramipril to once daily at night 2) INCREASE Imdur to 60mg  once daily  *If you need a refill on your cardiac medications before your next appointment, please call your pharmacy*   Lab Work: BMET and CBC today  If you have labs (blood work) drawn today and your tests are completely normal, you will receive your results only by: Marland Kitchen MyChart Message (if you have MyChart) OR . A paper copy in the mail If you have any lab test that is abnormal or we need to change your treatment, we will call you to review the results.   Testing/Procedures: Your physician has requested that you have a cardiac catheterization. Cardiac catheterization is used to diagnose and/or treat various heart conditions. Doctors may recommend this procedure for a number of different reasons. The most common reason is to evaluate chest pain. Chest pain can be a symptom of coronary artery disease (CAD), and cardiac catheterization can show whether plaque is narrowing or blocking your heart's arteries. This procedure is also used to evaluate the valves, as well as measure the blood flow and oxygen levels in different parts of your heart. For further information please visit [email or web address]. Please follow instruction sheet, as given.    Follow-Up: At [facility], you and your health needs are our priority.  As part of our continuing mission to provide you with exceptional  heart care, we have created designated Provider Care Teams.  These Care Teams include your primary Cardiologist (physician) and  Advanced Practice Providers (APPs -  Physician Assistants and Nurse Practitioners) who all work together to provide you with the care you need, when you need it.  We recommend signing up for the patient portal called "MyChart".  Sign up information is provided on this After Visit Summary.  MyChart is used to connect with patients for Virtual Visits (Telemedicine).  Patients are able to view lab/test results, encounter notes, upcoming appointments, etc.  Non-urgent messages can be sent to your provider as well.   To learn more about what you can do with MyChart, go to [email or web address].    Your next appointment:   2-3 week(s)  The format for your next appointment:   In Person  Provider:   You may see Sinclair Grooms, MD or one of the following Advanced Practice Providers on your designated Care Team:    Kathyrn Drown, NP    Other Instructions  Due to recent COVID-19 restrictions implemented by our local and state authorities and in an effort to keep both patients and staff as safe as possible, our hospital system requires COVID-19 testing prior to certain scheduled hospital procedures.  Please go to [address] [place], [place] [address] on today at 2:05pm  .  This is a drive up testing site.  You will not need to exit your vehicle. You must agree to self-quarantine from the time of your testing until the procedure date on Wednesday, May 18th.  This should included staying home with ONLY the people you live with.  Avoid take-out, grocery store shopping or leaving the house for any non-emergent reason.  Failure to have your COVID-19 test done on the date and time you have been scheduled will result in cancellation of your procedure.  Please call our office at [phone] if you have any questions.    [place] OFFICE [place], [address] [place] [address] Dept: [phone] Loc:  [place]  04/01/2021  You are scheduled for a Cardiac Catheterization on Wednesday, May 18 with Dr. Daneen Schick.  1. Please arrive at the [facility] (Main Entrance A) at [facility]: [address] [place], [place] [address] at 9:30 AM (This time is two hours before your procedure to ensure your preparation). Free valet parking service is available.   Special note: Every effort is made to have your procedure done on time. Please understand that emergencies sometimes delay scheduled procedures.  2. Diet: Do not eat solid foods after midnight.  The patient may have clear liquids until 5am upon the day of the procedure.  3. Labs: You will have labs drawn today  4. Medication instructions in preparation for your procedure:   Contrast Allergy: No   Do not take your Furosemide or Spironolactone the morning of your procedure.  On the morning of your procedure, take your Aspirin and any morning medicines NOT listed above.  You may use sips of water.  5. Plan for one night stay--bring personal belongings. 6. Bring a current list of your medications and current insurance cards. 7. You MUST have a responsible person to drive you home. 8. Someone MUST be with you the first 24 hours after you arrive home or your discharge will be delayed. 9. Please wear clothes that are easy to get on and off and  wear slip-on shoes.  Thank you for allowing [place] to care for you!   -- [place] [facility]     Signed, Sinclair Grooms, MD  04/01/2021 1:10 PM    [place]

## 2021-03-31 NOTE — Progress Notes (Addendum)
Cardiology Office Note:    Date:  04/03/2021   ID:  Andre Miranda, DOB Jul 03, 1936, MRN [id number]  PCP:  Crist Infante, MD  Cardiologist:  Sinclair Grooms, MD   Referring MD: Crist Infante, MD   Chief Complaint  Patient presents with  . Coronary Artery Disease    History of Present Illness:    Andre Miranda is a 85 y.o. 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 Acute on chronic diastolic heart failure resolved after repeat TAVR.  His wife.  Despite adding isosorbide he continues to have angina that disrupts daily activities.  Walking to the mailbox causes chest tightness.  Nitroglycerin helps relieve the discomfort.  It does not occur at rest.  He wonders if this could be gastrointestinal because it causes him to burp.  He has not had prolonged pain unrelieved by nitroglycerin.  At the last office visit up titration of medical therapy was attempted by adding isosorbide mononitrate 30 mg/day.  A nuclear perfusion study was performed and is outlined below.  It was low risk in appearance.  There could be matched ischemia.  Symptoms certainly are slowly progressive over time.  I reviewed the most recent coronary angiogram from 2020.  He had diffuse LAD disease beyond the LIMA insertion site.  The LIMA was widely patent.  The last couple angiograms demonstrated that the [place] was patent but could never be selectively engaged from the femoral approach.  Therefore the distal right coronary bed has not been fully investigated.  Saphenous vein graft to the diagonal was widely patent but did have nonobstructive disease.  The native right and left coronaries were totally occluded.  Past Medical History:  Diagnosis  Date  . Aortic stenosis    MODERATE  . Cancer [facility])    prostate cancer-radiation  . Carotid arterial disease ([place])   . Coronary artery disease   . Dysuria   . GERD (gastroesophageal reflux disease)   . Gout   . Hematuria   . History of urinary retention   . Hyperlipidemia   . Hypertension   . Obesity   . Sleep apnea    uses CPAP nightly    Past Surgical History:  Procedure Laterality Date  . AORTIC VALVE REPAIR  February 06, 2015   Dr. Aline Brochure and Dr. Ysidro Evert  at [facility]  . BIOPSY  09/26/2019   Procedure: BIOPSY;  Surgeon: Otis Brace, MD;  Location: [facility] ENDOSCOPY;  Service: Gastroenterology;;  . CAROTID ENDARTERECTOMY  02/2008  . CHOLECYSTECTOMY  04/2002  . COLONOSCOPY WITH PROPOFOL Left 09/26/2019   Procedure: COLONOSCOPY WITH PROPOFOL;  Surgeon: Otis Brace, MD;  Location: [place];  Service: Gastroenterology;  Laterality: Left;  . CORONARY ARTERY BYPASS GRAFT  2003   LIMA TO THE LAD, RIMA TO THE RCA, AND A SAPHENOUS VEIN GRAFT TO THE INTERMEDIATE AND DISTAL LEFT CIRCUMFLEX  . CORONARY/GRAFT ANGIOGRAPHY N/A 09/27/2019   Procedure: CORONARY/GRAFT ANGIOGRAPHY;  Surgeon: Belva Crome, MD;  Location: [place] CV LAB;  Service: Cardiovascular;  Laterality: N/A;  . ESOPHAGOGASTRODUODENOSCOPY (EGD) WITH PROPOFOL Left 09/26/2019   Procedure: ESOPHAGOGASTRODUODENOSCOPY (EGD) WITH PROPOFOL;  Surgeon: Otis Brace, MD;  Location: [facility] ENDOSCOPY;  Service: Gastroenterology;  Laterality: Left;  . ORIF ANKLE FRACTURE Right 02/14/2019   Procedure: OPEN REDUCTION INTERNAL FIXATION (ORIF) ANKLE  FRACTURE;  Surgeon: Netta Cedars, MD;  Location: [place];  Service: Orthopedics;  Laterality: Right;  . POLYPECTOMY  09/26/2019   Procedure: POLYPECTOMY;  Surgeon: Otis Brace, MD;  Location: [place] ENDOSCOPY;  Service: Gastroenterology;;  . US ECHOCARDIOGRAPHY  08/2009   SHOWED A MEAN GRADIENT ACROSS IS AORTIC VALVE OF 24 MM OF MERCURY. HE HAD MODERATE LVH AND  NORMAL LV FUNCTION    Current Medications: Current Meds  Medication Sig  . acetaminophen (TYLENOL) 500 MG tablet Take 500 mg by mouth every 6 (six) hours as needed for moderate pain.  Marland Kitchen allopurinol (ZYLOPRIM) 300 MG tablet Take 300 mg by mouth daily.  Marland Kitchen amLODipine (NORVASC) 5 MG tablet Take 1 tablet by mouth once daily (Patient taking differently: Take 5 mg by mouth daily.)  . aspirin EC 81 MG tablet Take 81 mg by mouth at bedtime.   . Cholecalciferol (VITAMIN D3) 1000 UNITS CAPS Take 1,000 Units by mouth at bedtime.  . famotidine (PEPCID) 20 MG tablet Take 20 mg by mouth daily before breakfast.  . furosemide (LASIX) 40 MG tablet Take 1 tablet (40 mg total) by mouth daily.  . isosorbide mononitrate (IMDUR) 60 MG 24 hr tablet Take 1 tablet (60 mg total) by mouth daily.  . Multiple Vitamins-Minerals (CENTRUM SILVER) tablet Take 1 tablet by mouth daily with breakfast.  . nitroGLYCERIN (NITROSTAT) 0.4 MG SL tablet Place 1 tablet (0.4 mg total) under the tongue every 5 (five) minutes as needed for chest pain.  . polyethylene glycol powder (GLYCOLAX/MIRALAX) 17 GM/SCOOP powder Take 17 g by mouth daily as needed for moderate constipation.  Marland Kitchen PRESCRIPTION MEDICATION CPAP: At bedtime  . spironolactone (ALDACTONE) 25 MG tablet Take 1 tablet (25 mg total) by mouth daily.  . Tamsulosin HCl (FLOMAX) 0.4 MG CAPS Take 0.4 mg by mouth every evening.  . [DISCONTINUED] amoxicillin (AMOXIL) 500 MG capsule Take 500 mg by mouth See admin instructions. Take 500 mg by mouth one hour prior to dental appointments  . [DISCONTINUED] isosorbide mononitrate (IMDUR) 30 MG 24 hr tablet Take 1 tablet (30 mg total) by mouth daily.  . [DISCONTINUED] rosuvastatin (CRESTOR) 20 MG tablet Take 10 mg by mouth every evening.     Allergies:   Ibuprofen   Social History   Socioeconomic History  . Marital status: Married    Spouse name: Not on file  . Number of children: Not on file  . Years of education: Not on file  .  Highest education level: Not on file  Occupational History  . Not on file  Tobacco Use  . Smoking status: Former Smoker    Packs/day: 1.50    Years: 7.00    Pack years: 10.50    Types: Cigarettes    Quit date: 04/03/1971    Years since quitting: 50.0  . Smokeless tobacco: Never Used  Vaping Use  . Vaping Use: Never used  Substance and Sexual Activity  . Alcohol use: Yes    Comment: social  . Drug use: No  . Sexual activity: Not on file  Other Topics Concern  . Not on file  Social History Narrative  . Not on file   Social Determinants of Health   Financial Resource Strain: Not on file  Food Insecurity: Not on file  Transportation Needs: Not on file  Physical Activity: Not on file  Stress: Not on file  Social Connections: Not on file     Family History: The patient's family history includes Heart attack in his  father; Pneumonia (age of onset: 8) in his mother.  ROS:   Please see the history of present illness.    Notes that his blood pressure fluctuates quite a bit at home.  He wonders if his valve in valve TAVR could be causing some of his symptoms.  He has kept a precise log of blood pressures and heart rates.  Day-to-day is well within normal range with blood pressures ranging between 120 and [id number] mmHg systolic.  The average blood pressures appear to be around 135/70 mmHg.  All other systems reviewed and are negative.  EKGs/Labs/Other Studies Reviewed:    The following studies were reviewed today:  NUCLEAR IMAGING 03/06/2021: Study Highlights    Nuclear stress EF: 55%.  The left ventricular ejection fraction is normal (55-65%).  There was no ST segment deviation noted during stress.  The study is normal.  This is a low risk study.   Normal resting and stress perfusion. No ischemia or infarction EF 55%  Upon personal review of the nuclear images, there appears to be inferolateral ischemia that is in the distribution of the saphenous vein graft to the  circumflex.  I am suspicious that there may be significant bypass graft disease.   EKG:  EKG performed today demonstrates right bundle, sinus rhythm with first-degree AV block 214 ms, and no acute ST-T wave change.  When compared to February 28, 2021, no significant noted.  Recent Labs: 04/01/2021: BUN 24; Creatinine, Ser 1.02; Hemoglobin 13.7; Platelets 152; Potassium 4.6; Sodium 135  Recent Lipid Panel    Component Value Date/Time   CHOL 90 04/23/2019 0249   TRIG 79 04/23/2019 0249   HDL 32 (L) 04/23/2019 0249   CHOLHDL 2.8 04/23/2019 0249   VLDL 16 04/23/2019 0249   LDLCALC 42 04/23/2019 0249    Physical Exam:    VS:  BP (!) 94/58   Pulse 77   Ht 5\' 10"  (1.778 m)   Wt 228 lb 3.2 oz (103.5 kg)   SpO2 98%   BMI 32.74 kg/m     Wt Readings from Last 3 Encounters:  04/03/21 222 lb (100.7 kg)  04/01/21 228 lb 3.2 oz (103.5 kg)  03/06/21 226 lb (102.5 kg)     GEN: Obese.  No acute distress..  Skin color is normal. HEENT: Normal NECK: No JVD. LYMPHATICS: No lymphadenopathy CARDIAC: 2-3 over 6 low pitched crescendo decrescendo systolic right upper sternal and left mid sternal murmur related to valve in valve TAVR.Marland Kitchen RRR no gallop, or edema. VASCULAR:  Normal Pulses. No bruits. RESPIRATORY:  Clear to auscultation without rales, wheezing or rhonchi  ABDOMEN: Soft, non-tender, non-distended, No pulsatile mass, MUSCULOSKELETAL: No deformity  SKIN: Warm and dry NEUROLOGIC:  Alert and oriented x 3 PSYCHIATRIC:  Normal affect   ASSESSMENT:    1. Coronary artery disease involving native coronary artery of native heart without angina pectoris   2. RAS (renal artery stenosis) ([place])   3. Essential hypertension   4. Chronic diastolic heart failure (HCC)   5. Other hyperlipidemia   6. Bilateral carotid artery occlusion   7. History of transcatheter aortic valve replacement (TAVR)   8. Angina pectoris ([place])    PLAN:    In order of problems listed above:  1. Despite a low risk  myocardial perfusion study (suspicious for balanced ischemia) we will proceed with coronary angiography to define anatomy and determine if an interventional approach can improve anginal status.  Described the diffuse nature of disease in the LAD off of  the left internal mammary.  This disease is heavily calcified and is not approachable by stenting as the LAD is very small and diffusely diseased.  We have never seen the distal right coronary bed from the right internal mammary graft because selective engagement has not been possible.  We will plan this procedure from the right radial approach and may forego performing left radial approach or femoral approach to look at LIMA.  We should be able to image the saphenous vein graft to the circumflex without difficulty and perform PCI if indicated.  Again I am suspicious that there is inferolateral ischemia on my over read of the nuclear study.  Procedure and risks were discussed with the patient.  With reference to angina, we will go ahead and increase isosorbide mononitrate to 60 mg/day, decrease Altace to 5 mg in the evening rather than twice daily dosing, and have encouraged patient to use nitroglycerin as needed to control symptoms.  Also cautioned the patient to stay under the anginal threshold. 2. No specific comments about renal artery stenosis. 3. Initial blood pressure today was relatively low at 94/56 but upon repeat blood pressure while sitting it was 110/60. 4. No evidence of volume overload. 5. Continue high intensity statin therapy. 6. Continue secondary preventive measures. 7. Has valve in valve TAVR. 8. Coronary angiography is recommended: The patient was counseled to undergo left heart catheterization, coronary angiography, and possible percutaneous coronary intervention with stent implantation via the right radial approach. The procedural risks and benefits were discussed in detail. The risks discussed included death, stroke, myocardial  infarction, life-threatening bleeding, limb ischemia, kidney injury, allergy, and possible emergency cardiac surgery. The risk of these significant complications were estimated to occur less than 1% of the time. After discussion, the patient has agreed to proceed.     Medication Adjustments/Labs and Tests Ordered: Current medicines are reviewed at length with the patient today.  Concerns regarding medicines are outlined above.  Orders Placed This Encounter  Procedures  . Basic metabolic panel  . CBC  . EKG 12-Lead   Meds ordered this encounter  Medications  . ramipril (ALTACE) 5 MG capsule    Sig: Take 1 capsule (5 mg total) by mouth at bedtime.    Dispense:  90 capsule    Refill:  3    Dose change  . isosorbide mononitrate (IMDUR) 60 MG 24 hr tablet    Sig: Take 1 tablet (60 mg total) by mouth daily.    Dispense:  90 tablet    Refill:  3    Dose change    Patient Instructions  Medication Instructions:  1) DECREASE Ramipril to once daily at night 2) INCREASE Imdur to 60mg  once daily  *If you need a refill on your cardiac medications before your next appointment, please call your pharmacy*   Lab Work: BMET and CBC today  If you have labs (blood work) drawn today and your tests are completely normal, you will receive your results only by: Marland Kitchen MyChart Message (if you have MyChart) OR . A paper copy in the mail If you have any lab test that is abnormal or we need to change your treatment, we will call you to review the results.   Testing/Procedures: Your physician has requested that you have a cardiac catheterization. Cardiac catheterization is used to diagnose and/or treat various heart conditions. Doctors may recommend this procedure for a number of different reasons. The most common reason is to evaluate chest pain. Chest pain can be a  symptom of coronary artery disease (CAD), and cardiac catheterization can show whether plaque is narrowing or blocking your heart's arteries.  This procedure is also used to evaluate the valves, as well as measure the blood flow and oxygen levels in different parts of your heart. For further information please visit [email or web address].[email or web address]. Please follow instruction sheet, as given.    Follow-Up: At [facility], you and your health needs are our priority.  As part of our continuing mission to provide you with exceptional heart care, we have created designated Provider Care Teams.  These Care Teams include your primary Cardiologist (physician) and Advanced Practice Providers (APPs -  Physician Assistants and Nurse Practitioners) who all work together to provide you with the care you need, when you need it.  We recommend signing up for the patient portal called "MyChart".  Sign up information is provided on this After Visit Summary.  MyChart is used to connect with patients for Virtual Visits (Telemedicine).  Patients are able to view lab/test results, encounter notes, upcoming appointments, etc.  Non-urgent messages can be sent to your provider as well.   To learn more about what you can do with MyChart, go to [email or web address].    Your next appointment:   2-3 week(s)  The format for your next appointment:   In Person  Provider:   You may see Sinclair Grooms, MD or one of the following Advanced Practice Providers on your designated Care Team:    Kathyrn Drown, NP    Other Instructions  Due to recent COVID-19 restrictions implemented by our local and state authorities and in an effort to keep both patients and staff as safe as possible, our hospital system requires COVID-19 testing prior to certain scheduled hospital procedures.  Please go to [place]. [place], [place] [address] on today at 2:05pm  .  This is a drive up testing site.  You will not need to exit your vehicle. You must agree to self-quarantine from the time of your testing until the procedure date on Wednesday, May 18th.  This should included staying home  with ONLY the people you live with.  Avoid take-out, grocery store shopping or leaving the house for any non-emergent reason.  Failure to have your COVID-19 test done on the date and time you have been scheduled will result in cancellation of your procedure.  Please call our office at [phone] if you have any questions.    [place] OFFICE [place], [place] [address] Dept: [phone] Loc: [place]  04/01/2021  You are scheduled for a Cardiac Catheterization on Wednesday, May 18 with Dr. Daneen Schick.  1. Please arrive at the [facility] (Main Entrance A) at [facility]: [address] [place], [place] [address] at 9:30 AM (This time is two hours before your procedure to ensure your preparation). Free valet parking service is available.   Special note: Every effort is made to have your procedure done on time. Please understand that emergencies sometimes delay scheduled procedures.  2. Diet: Do not eat solid foods after midnight.  The patient may have clear liquids until 5am upon the day of the procedure.  3. Labs: You will have labs drawn today  4. Medication instructions in preparation for your procedure:   Contrast Allergy: No   Do not take your Furosemide or Spironolactone the morning of your procedure.  On the morning of  your procedure, take your Aspirin and any morning medicines NOT listed above.  You may use sips of water.  5. Plan for one night stay--bring personal belongings. 6. Bring a current list of your medications and current insurance cards. 7. You MUST have a responsible person to drive you home. 8. Someone MUST be with you the first 24 hours after you arrive home or your discharge will be delayed. 9. Please wear clothes that are easy to get on and off and wear slip-on shoes.  Thank you for allowing [place] to care for you!   -- [facility] Invasive Cardiovascular services     Signed, Sinclair Grooms, MD  04/03/2021 11:58 AM    [place]

## 2021-04-01 ENCOUNTER — Encounter: Payer: Self-pay | Admitting: Interventional Cardiology

## 2021-04-01 ENCOUNTER — Other Ambulatory Visit: Payer: Self-pay

## 2021-04-01 ENCOUNTER — Ambulatory Visit (INDEPENDENT_AMBULATORY_CARE_PROVIDER_SITE_OTHER): Payer: Medicare Other | Admitting: Interventional Cardiology

## 2021-04-01 ENCOUNTER — Other Ambulatory Visit (HOSPITAL_COMMUNITY)
Admission: RE | Admit: 2021-04-01 | Discharge: 2021-04-01 | Disposition: A | Discharge status: Home / Self Care | Payer: Medicare Other | Source: Ambulatory Visit | Attending: Interventional Cardiology | Admitting: Interventional Cardiology

## 2021-04-01 VITALS — BP 94/58 | HR 77 | Ht 70.0 in | Wt 228.2 lb

## 2021-04-01 DIAGNOSIS — I10 Essential (primary) hypertension: Secondary | ICD-10-CM

## 2021-04-01 DIAGNOSIS — I6523 Occlusion and stenosis of bilateral carotid arteries: Secondary | ICD-10-CM | POA: Diagnosis not present

## 2021-04-01 DIAGNOSIS — Z20822 Contact with and (suspected) exposure to covid-19: Secondary | ICD-10-CM | POA: Diagnosis not present

## 2021-04-01 DIAGNOSIS — I209 Angina pectoris, unspecified: Secondary | ICD-10-CM

## 2021-04-01 DIAGNOSIS — I701 Atherosclerosis of renal artery: Secondary | ICD-10-CM | POA: Diagnosis not present

## 2021-04-01 DIAGNOSIS — E7849 Other hyperlipidemia: Secondary | ICD-10-CM | POA: Diagnosis not present

## 2021-04-01 DIAGNOSIS — I5032 Chronic diastolic (congestive) heart failure: Secondary | ICD-10-CM

## 2021-04-01 DIAGNOSIS — Z01812 Encounter for preprocedural laboratory examination: Secondary | ICD-10-CM | POA: Insufficient documentation

## 2021-04-01 DIAGNOSIS — I2510 Atherosclerotic heart disease of native coronary artery without angina pectoris: Secondary | ICD-10-CM

## 2021-04-01 DIAGNOSIS — Z952 Presence of prosthetic heart valve: Secondary | ICD-10-CM

## 2021-04-01 LAB — CBC
Hematocrit: 40.6 % (ref 37.5–51.0)
Hemoglobin: 13.7 g/dL (ref 13.0–17.7)
MCH: 32.2 pg (ref 26.6–33.0)
MCHC: 33.7 g/dL (ref 31.5–35.7)
MCV: 95 fL (ref 79–97)
Platelets: 152 10*3/uL (ref 150–450)
RBC: 4.26 x10E6/uL (ref 4.14–5.80)
RDW: 13.6 % (ref 11.6–15.4)
WBC: 6.5 10*3/uL (ref 3.4–10.8)

## 2021-04-01 LAB — BASIC METABOLIC PANEL
BUN/Creatinine Ratio: 24 (ref 10–24)
BUN: 24 mg/dL (ref 8–27)
CO2: 25 mmol/L (ref 20–29)
Calcium: 9.5 mg/dL (ref 8.6–10.2)
Chloride: 98 mmol/L (ref 96–106)
Creatinine, Ser: 1.02 mg/dL (ref 0.76–1.27)
Glucose: 112 mg/dL — ABNORMAL HIGH (ref 65–99)
Potassium: 4.6 mmol/L (ref 3.5–5.2)
Sodium: 135 mmol/L (ref 134–144)
eGFR: 73 mL/min/{1.73_m2} (ref >59)

## 2021-04-01 MED ORDER — RAMIPRIL 5 MG PO CAPS
5.0000 mg | ORAL_CAPSULE | Freq: Every day | ORAL | 3 refills | Status: DC
Start: 2021-04-01 — End: 2022-04-09

## 2021-04-01 MED ORDER — ISOSORBIDE MONONITRATE ER 60 MG PO TB24: 60.0000 mg | ORAL_TABLET | Freq: Every day | ORAL | 3 refills | Status: DC

## 2021-04-01 NOTE — Patient Instructions (Addendum)
Medication Instructions:  1) DECREASE Ramipril to once daily at night 2) INCREASE Imdur to 60mg  once daily  *If you need a refill on your cardiac medications before your next appointment, please call your pharmacy*   Lab Work: BMET and CBC today  If you have labs (blood work) drawn today and your tests are completely normal, you will receive your results only by: Marland Kitchen MyChart Message (if you have MyChart) OR . A paper copy in the mail If you have any lab test that is abnormal or we need to change your treatment, we will call you to review the results.   Testing/Procedures: Your physician has requested that you have a cardiac catheterization. Cardiac catheterization is used to diagnose and/or treat various heart conditions. Doctors may recommend this procedure for a number of different reasons. The most common reason is to evaluate chest pain. Chest pain can be a symptom of coronary artery disease (CAD), and cardiac catheterization can show whether plaque is narrowing or blocking your heart's arteries. This procedure is also used to evaluate the valves, as well as measure the blood flow and oxygen levels in different parts of your heart. For further information please visit HugeFiesta.[email or web address]. Please follow instruction sheet, as given.    Follow-Up: At [facility], you and your health needs are our priority.  As part of our continuing mission to provide you with exceptional heart care, we have created designated Provider Care Teams.  These Care Teams include your primary Cardiologist (physician) and Advanced Practice Providers (APPs -  Physician Assistants and Nurse Practitioners) who all work together to provide you with the care you need, when you need it.  We recommend signing up for the patient portal called "MyChart".  Sign up information is provided on this After Visit Summary.  MyChart is used to connect with patients for Virtual Visits (Telemedicine).  Patients are able to view  lab/test results, encounter notes, upcoming appointments, etc.  Non-urgent messages can be sent to your provider as well.   To learn more about what you can do with MyChart, go to [email or web address].    Your next appointment:   2-3 week(s)  The format for your next appointment:   In Person  Provider:   You may see Sinclair Grooms, MD or one of the following Advanced Practice Providers on your designated Care Team:    Kathyrn Drown, NP    Other Instructions  Due to recent COVID-19 restrictions implemented by our local and state authorities and in an effort to keep both patients and staff as safe as possible, our hospital system requires COVID-19 testing prior to certain scheduled hospital procedures.  Please go to [place]. [place], [place] [address] on today at 2:05pm  .  This is a drive up testing site.  You will not need to exit your vehicle. You must agree to self-quarantine from the time of your testing until the procedure date on Wednesday, May 18th.  This should included staying home with ONLY the people you live with.  Avoid take-out, grocery store shopping or leaving the house for any non-emergent reason.  Failure to have your COVID-19 test done on the date and time you have been scheduled will result in cancellation of your procedure.  Please call our office at [phone] if you have any questions.    [place] OFFICE [place], [place] [address] Dept: [place]  Gwyndolyn Saxon  ABHISHEK LEVESQUE  04/01/2021  You are scheduled for a Cardiac Catheterization on Wednesday, May 18 with Dr. Daneen Schick.  1. Please arrive at the [facility] (Main Entrance A) at [facility]: [address] [place], [place] [address] at 9:30 AM (This time is two hours before your procedure to ensure your preparation). Free valet parking service is available.   Special  note: Every effort is made to have your procedure done on time. Please understand that emergencies sometimes delay scheduled procedures.  2. Diet: Do not eat solid foods after midnight.  The patient may have clear liquids until 5am upon the day of the procedure.  3. Labs: You will have labs drawn today  4. Medication instructions in preparation for your procedure:   Contrast Allergy: No   Do not take your Furosemide or Spironolactone the morning of your procedure.  On the morning of your procedure, take your Aspirin and any morning medicines NOT listed above.  You may use sips of water.  5. Plan for one night stay--bring personal belongings. 6. Bring a current list of your medications and current insurance cards. 7. You MUST have a responsible person to drive you home. 8. Someone MUST be with you the first 24 hours after you arrive home or your discharge will be delayed. 9. Please wear clothes that are easy to get on and off and wear slip-on shoes.  Thank you for allowing [place] to care for you!   -- [place] Invasive Cardiovascular services

## 2021-04-02 ENCOUNTER — Telehealth: Payer: Self-pay | Admitting: *Deleted

## 2021-04-02 LAB — SARS CORONAVIRUS 2 (TAT 6-24 HRS): SARS Coronavirus 2: NEGATIVE

## 2021-04-02 NOTE — Telephone Encounter (Signed)
Pt contacted pre-catheterization scheduled at [facility] for: Wednesday Apr 03, 2021 11:30 AM Verified arrival time and place: [facility]) at: 9:30 AM   No solid food after midnight prior to cath, clear liquids until 5 AM day of procedure.  Hold: Spironolactone-AM of procedure Lasix-AM of procedure  Except hold medications AM meds can be  taken pre-cath with sips of water including: ASA 81 mg   Confirmed patient has responsible adult to drive home post procedure and be with patient first 24 hours after arriving home: yes  You are allowed ONE visitor in the waiting room during the time you are at the hospital for your procedure. Both you and your visitor must wear a mask once you enter the hospital.   Reviewed procedure/mask/visitor instructions with patient.

## 2021-04-03 ENCOUNTER — Encounter (HOSPITAL_COMMUNITY): Payer: Self-pay | Admitting: Interventional Cardiology

## 2021-04-03 ENCOUNTER — Ambulatory Visit (HOSPITAL_COMMUNITY)
Admission: RE | Admit: 2021-04-03 | Discharge: 2021-04-04 | Disposition: A | Discharge status: Home / Self Care | Payer: Medicare Other | Attending: Interventional Cardiology | Admitting: Interventional Cardiology

## 2021-04-03 ENCOUNTER — Other Ambulatory Visit: Payer: Self-pay

## 2021-04-03 ENCOUNTER — Encounter: Payer: Self-pay | Admitting: Interventional Cardiology

## 2021-04-03 ENCOUNTER — Ambulatory Visit (HOSPITAL_COMMUNITY)
Admission: RE | Disposition: A | Discharge status: Home / Self Care | Payer: Self-pay | Source: Home / Self Care | Attending: Interventional Cardiology

## 2021-04-03 DIAGNOSIS — Z79899 Other long term (current) drug therapy: Secondary | ICD-10-CM | POA: Diagnosis not present

## 2021-04-03 DIAGNOSIS — Z87891 Personal history of nicotine dependence: Secondary | ICD-10-CM | POA: Diagnosis not present

## 2021-04-03 DIAGNOSIS — G4733 Obstructive sleep apnea (adult) (pediatric): Secondary | ICD-10-CM | POA: Insufficient documentation

## 2021-04-03 DIAGNOSIS — Z8249 Family history of ischemic heart disease and other diseases of the circulatory system: Secondary | ICD-10-CM | POA: Insufficient documentation

## 2021-04-03 DIAGNOSIS — Z7982 Long term (current) use of aspirin: Secondary | ICD-10-CM | POA: Insufficient documentation

## 2021-04-03 DIAGNOSIS — Z950 Presence of cardiac pacemaker: Secondary | ICD-10-CM | POA: Diagnosis not present

## 2021-04-03 DIAGNOSIS — Z9049 Acquired absence of other specified parts of digestive tract: Secondary | ICD-10-CM | POA: Insufficient documentation

## 2021-04-03 DIAGNOSIS — E785 Hyperlipidemia, unspecified: Secondary | ICD-10-CM | POA: Diagnosis not present

## 2021-04-03 DIAGNOSIS — Z886 Allergy status to analgesic agent status: Secondary | ICD-10-CM | POA: Insufficient documentation

## 2021-04-03 DIAGNOSIS — E669 Obesity, unspecified: Secondary | ICD-10-CM | POA: Insufficient documentation

## 2021-04-03 DIAGNOSIS — I110 Hypertensive heart disease with heart failure: Secondary | ICD-10-CM | POA: Diagnosis not present

## 2021-04-03 DIAGNOSIS — I209 Angina pectoris, unspecified: Secondary | ICD-10-CM

## 2021-04-03 DIAGNOSIS — Z6832 Body mass index (BMI) 32.0-32.9, adult: Secondary | ICD-10-CM | POA: Insufficient documentation

## 2021-04-03 DIAGNOSIS — I25719 Atherosclerosis of autologous vein coronary artery bypass graft(s) with unspecified angina pectoris: Secondary | ICD-10-CM

## 2021-04-03 DIAGNOSIS — I2510 Atherosclerotic heart disease of native coronary artery without angina pectoris: Secondary | ICD-10-CM | POA: Diagnosis not present

## 2021-04-03 DIAGNOSIS — I25119 Atherosclerotic heart disease of native coronary artery with unspecified angina pectoris: Secondary | ICD-10-CM | POA: Diagnosis not present

## 2021-04-03 DIAGNOSIS — I25700 Atherosclerosis of coronary artery bypass graft(s), unspecified, with unstable angina pectoris: Secondary | ICD-10-CM | POA: Diagnosis not present

## 2021-04-03 DIAGNOSIS — I5033 Acute on chronic diastolic (congestive) heart failure: Secondary | ICD-10-CM | POA: Insufficient documentation

## 2021-04-03 DIAGNOSIS — Z952 Presence of prosthetic heart valve: Secondary | ICD-10-CM | POA: Diagnosis not present

## 2021-04-03 DIAGNOSIS — I25810 Atherosclerosis of coronary artery bypass graft(s) without angina pectoris: Secondary | ICD-10-CM | POA: Diagnosis present

## 2021-04-03 DIAGNOSIS — I6529 Occlusion and stenosis of unspecified carotid artery: Secondary | ICD-10-CM | POA: Insufficient documentation

## 2021-04-03 DIAGNOSIS — Z955 Presence of coronary angioplasty implant and graft: Secondary | ICD-10-CM

## 2021-04-03 DIAGNOSIS — Z8546 Personal history of malignant neoplasm of prostate: Secondary | ICD-10-CM | POA: Insufficient documentation

## 2021-04-03 DIAGNOSIS — I10 Essential (primary) hypertension: Secondary | ICD-10-CM | POA: Diagnosis present

## 2021-04-03 HISTORY — PX: CORONARY STENT INTERVENTION: CATH118234

## 2021-04-03 HISTORY — PX: CORONARY/GRAFT ANGIOGRAPHY: CATH118237

## 2021-04-03 LAB — CBC
HCT: 34.6 % — ABNORMAL LOW (ref 39.0–52.0)
Hemoglobin: 11.9 g/dL — ABNORMAL LOW (ref 13.0–17.0)
MCH: 33 pg (ref 26.0–34.0)
MCHC: 34.4 g/dL (ref 30.0–36.0)
MCV: 95.8 fL (ref 80.0–100.0)
Platelets: 136 10*3/uL — ABNORMAL LOW (ref 150–400)
RBC: 3.61 MIL/uL — ABNORMAL LOW (ref 4.22–5.81)
RDW: 13.1 % (ref 11.5–15.5)
WBC: 5.5 10*3/uL (ref 4.0–10.5)
nRBC: 0 % (ref 0.0–0.2)

## 2021-04-03 LAB — POCT ACTIVATED CLOTTING TIME
Activated Clotting Time: 190 seconds
Activated Clotting Time: 160 seconds

## 2021-04-03 LAB — CREATININE, SERUM
Creatinine, Ser: 0.75 mg/dL (ref 0.61–1.24)
GFR, Estimated: >60 mL/min (ref >60)

## 2021-04-03 SURGERY — CORONARY STENT INTERVENTION
Anesthesia: LOCAL

## 2021-04-03 MED ORDER — TAMSULOSIN HCL 0.4 MG PO CAPS: 0.4000 mg | ORAL_CAPSULE | Freq: Every evening | ORAL | Status: DC

## 2021-04-03 MED ORDER — VERAPAMIL HCL 2.5 MG/ML IV SOLN
INTRAVENOUS | Status: DC | PRN
  Administered 2021-04-03: 100 ug via INTRACORONARY

## 2021-04-03 MED ORDER — SPIRONOLACTONE 25 MG PO TABS
25.0000 mg | ORAL_TABLET | Freq: Every day | ORAL | Status: DC
  Administered 2021-04-03 – 2021-04-04 (×2): 25 mg via ORAL
  Filled 2021-04-03 (×2): qty 1

## 2021-04-03 MED ORDER — HEPARIN SODIUM (PORCINE) 1000 UNIT/ML IJ SOLN
INTRAMUSCULAR | Status: DC | PRN
  Administered 2021-04-03: 5000 [IU] via INTRAVENOUS

## 2021-04-03 MED ORDER — TAMSULOSIN HCL 0.4 MG PO CAPS
0.4000 mg | ORAL_CAPSULE | Freq: Every day | ORAL | Status: DC
  Administered 2021-04-03: 0.4 mg via ORAL
  Filled 2021-04-03: qty 1

## 2021-04-03 MED ORDER — ALLOPURINOL 300 MG PO TABS
300.0000 mg | ORAL_TABLET | Freq: Every day | ORAL | Status: DC
  Administered 2021-04-04: 300 mg via ORAL
  Filled 2021-04-03: qty 1

## 2021-04-03 MED ORDER — FENTANYL CITRATE (PF) 100 MCG/2ML IJ SOLN
INTRAMUSCULAR | Status: DC | PRN
  Administered 2021-04-03 (×2): 25 ug via INTRAVENOUS

## 2021-04-03 MED ORDER — CLOPIDOGREL BISULFATE 75 MG PO TABS
75.0000 mg | ORAL_TABLET | Freq: Every day | ORAL | Status: DC
  Administered 2021-04-04: 75 mg via ORAL
  Filled 2021-04-03: qty 1

## 2021-04-03 MED ORDER — SODIUM CHLORIDE 0.9% FLUSH: 3.0000 mL | INTRAVENOUS | Status: DC | PRN

## 2021-04-03 MED ORDER — HEPARIN (PORCINE) IN NACL 1000-0.9 UT/500ML-% IV SOLN
INTRAVENOUS | Status: AC
  Filled 2021-04-03: qty 500

## 2021-04-03 MED ORDER — MIDAZOLAM HCL 2 MG/2ML IJ SOLN
INTRAMUSCULAR | Status: AC
  Filled 2021-04-03: qty 2

## 2021-04-03 MED ORDER — BIVALIRUDIN TRIFLUOROACETATE 250 MG IV SOLR
INTRAVENOUS | Status: AC
  Filled 2021-04-03: qty 250

## 2021-04-03 MED ORDER — IOHEXOL 350 MG/ML SOLN
INTRAVENOUS | Status: DC | PRN
  Administered 2021-04-03: 113 mL

## 2021-04-03 MED ORDER — LIDOCAINE HCL (PF) 1 % IJ SOLN
INTRAMUSCULAR | Status: DC | PRN
  Administered 2021-04-03: 15 mL
  Administered 2021-04-03: 2 mL

## 2021-04-03 MED ORDER — LIDOCAINE HCL (PF) 1 % IJ SOLN
INTRAMUSCULAR | Status: AC
  Filled 2021-04-03: qty 30

## 2021-04-03 MED ORDER — ASPIRIN 81 MG PO CHEW: 81.0000 mg | CHEWABLE_TABLET | ORAL | Status: DC

## 2021-04-03 MED ORDER — FENTANYL CITRATE (PF) 100 MCG/2ML IJ SOLN
INTRAMUSCULAR | Status: AC
  Filled 2021-04-03: qty 2

## 2021-04-03 MED ORDER — AMLODIPINE BESYLATE 5 MG PO TABS
5.0000 mg | ORAL_TABLET | Freq: Every day | ORAL | Status: DC
  Administered 2021-04-04: 5 mg via ORAL
  Filled 2021-04-03: qty 1

## 2021-04-03 MED ORDER — VERAPAMIL HCL 2.5 MG/ML IV SOLN
INTRAVENOUS | Status: AC
  Filled 2021-04-03: qty 2

## 2021-04-03 MED ORDER — SODIUM CHLORIDE 0.9 % IV SOLN: 250.0000 mL | INTRAVENOUS | Status: DC | PRN

## 2021-04-03 MED ORDER — NITROGLYCERIN 0.4 MG SL SUBL: 0.4000 mg | SUBLINGUAL_TABLET | SUBLINGUAL | Status: DC | PRN

## 2021-04-03 MED ORDER — ACETAMINOPHEN 500 MG PO TABS: 500.0000 mg | ORAL_TABLET | Freq: Four times a day (QID) | ORAL | Status: DC | PRN

## 2021-04-03 MED ORDER — SODIUM CHLORIDE 0.9 % IV SOLN
INTRAVENOUS | Status: AC | PRN
  Administered 2021-04-03: 10 mL/h via INTRAVENOUS

## 2021-04-03 MED ORDER — ACETAMINOPHEN 325 MG PO TABS
650.0000 mg | ORAL_TABLET | ORAL | Status: DC | PRN
  Administered 2021-04-03 (×2): 650 mg via ORAL
  Filled 2021-04-03: qty 2

## 2021-04-03 MED ORDER — SODIUM CHLORIDE 0.9 % WEIGHT BASED INFUSION: 1.0000 mL/kg/h | INTRAVENOUS | Status: DC

## 2021-04-03 MED ORDER — SODIUM CHLORIDE 0.9 % IV SOLN
INTRAVENOUS | Status: AC
  Administered 2021-04-03: 125 mL/h via INTRAVENOUS

## 2021-04-03 MED ORDER — SODIUM CHLORIDE 0.9 % WEIGHT BASED INFUSION
3.0000 mL/kg/h | INTRAVENOUS | Status: DC
  Administered 2021-04-03: 3 mL/kg/h via INTRAVENOUS

## 2021-04-03 MED ORDER — FAMOTIDINE 20 MG PO TABS
20.0000 mg | ORAL_TABLET | Freq: Every day | ORAL | Status: DC
  Administered 2021-04-04: 20 mg via ORAL
  Filled 2021-04-03: qty 1

## 2021-04-03 MED ORDER — FUROSEMIDE 20 MG PO TABS
40.0000 mg | ORAL_TABLET | Freq: Every day | ORAL | Status: DC
  Administered 2021-04-04: 40 mg via ORAL
  Filled 2021-04-03 (×2): qty 2

## 2021-04-03 MED ORDER — SODIUM CHLORIDE 0.9 % IV SOLN
INTRAVENOUS | Status: DC | PRN
  Administered 2021-04-03 (×2): 1.75 mg/kg/h via INTRAVENOUS

## 2021-04-03 MED ORDER — ACETAMINOPHEN 325 MG PO TABS
ORAL_TABLET | ORAL | Status: AC
  Filled 2021-04-03: qty 2

## 2021-04-03 MED ORDER — FAMOTIDINE IN NACL 20-0.9 MG/50ML-% IV SOLN
INTRAVENOUS | Status: AC
  Filled 2021-04-03: qty 50

## 2021-04-03 MED ORDER — HEPARIN (PORCINE) IN NACL 1000-0.9 UT/500ML-% IV SOLN
INTRAVENOUS | Status: DC | PRN
Start: 2021-04-03 — End: 2021-04-03
  Administered 2021-04-03 (×3): 500 mL

## 2021-04-03 MED ORDER — OXYCODONE HCL 5 MG PO TABS
5.0000 mg | ORAL_TABLET | ORAL | Status: DC | PRN
Start: 2021-04-03 — End: 2021-04-04

## 2021-04-03 MED ORDER — ROSUVASTATIN CALCIUM 5 MG PO TABS
10.0000 mg | ORAL_TABLET | Freq: Every day | ORAL | Status: DC
  Administered 2021-04-03: 10 mg via ORAL
  Filled 2021-04-03: qty 2

## 2021-04-03 MED ORDER — MIDAZOLAM HCL 2 MG/2ML IJ SOLN
INTRAMUSCULAR | Status: DC | PRN
  Administered 2021-04-03: 0.5 mg via INTRAVENOUS
  Administered 2021-04-03 (×2): 1 mg via INTRAVENOUS

## 2021-04-03 MED ORDER — CLOPIDOGREL BISULFATE 300 MG PO TABS
ORAL_TABLET | ORAL | Status: DC | PRN
  Administered 2021-04-03: 600 mg via ORAL

## 2021-04-03 MED ORDER — RAMIPRIL 5 MG PO CAPS
5.0000 mg | ORAL_CAPSULE | Freq: Two times a day (BID) | ORAL | Status: DC
  Administered 2021-04-04: 5 mg via ORAL
  Filled 2021-04-03 (×2): qty 1

## 2021-04-03 MED ORDER — TEMAZEPAM 15 MG PO CAPS: 15.0000 mg | ORAL_CAPSULE | Freq: Every evening | ORAL | Status: DC | PRN

## 2021-04-03 MED ORDER — LABETALOL HCL 5 MG/ML IV SOLN: 10.0000 mg | INTRAVENOUS | Status: AC | PRN

## 2021-04-03 MED ORDER — SODIUM CHLORIDE 0.9% FLUSH
3.0000 mL | Freq: Two times a day (BID) | INTRAVENOUS | Status: DC
  Administered 2021-04-03 – 2021-04-04 (×2): 3 mL via INTRAVENOUS

## 2021-04-03 MED ORDER — ROSUVASTATIN CALCIUM 10 MG PO TABS
10.0000 mg | ORAL_TABLET | Freq: Every evening | ORAL | Status: DC
  Filled 2021-04-03: qty 1

## 2021-04-03 MED ORDER — HEPARIN SODIUM (PORCINE) 5000 UNIT/ML IJ SOLN
5000.0000 [IU] | Freq: Three times a day (TID) | INTRAMUSCULAR | Status: DC
  Administered 2021-04-03 – 2021-04-04 (×2): 5000 [IU] via SUBCUTANEOUS
  Filled 2021-04-03 (×2): qty 1

## 2021-04-03 MED ORDER — BIVALIRUDIN BOLUS VIA INFUSION - CUPID
INTRAVENOUS | Status: DC | PRN
  Administered 2021-04-03 (×2): 75.525 mg via INTRAVENOUS

## 2021-04-03 MED ORDER — FAMOTIDINE IN NACL 20-0.9 MG/50ML-% IV SOLN
INTRAVENOUS | Status: AC | PRN
  Administered 2021-04-03: 20 mg via INTRAVENOUS

## 2021-04-03 MED ORDER — HYDRALAZINE HCL 20 MG/ML IJ SOLN: 10.0000 mg | INTRAMUSCULAR | Status: AC | PRN

## 2021-04-03 MED ORDER — CLOPIDOGREL BISULFATE 300 MG PO TABS
ORAL_TABLET | ORAL | Status: AC
  Filled 2021-04-03: qty 2

## 2021-04-03 MED ORDER — ASPIRIN 81 MG PO CHEW
81.0000 mg | CHEWABLE_TABLET | Freq: Every day | ORAL | Status: DC
  Administered 2021-04-04: 81 mg via ORAL
  Filled 2021-04-03: qty 1

## 2021-04-03 MED ORDER — SODIUM CHLORIDE 0.9% FLUSH: 3.0000 mL | Freq: Two times a day (BID) | INTRAVENOUS | Status: DC

## 2021-04-03 MED ORDER — ONDANSETRON HCL 4 MG/2ML IJ SOLN: 4.0000 mg | Freq: Four times a day (QID) | INTRAMUSCULAR | Status: DC | PRN

## 2021-04-03 SURGICAL SUPPLY — 18 items
BALLN SAPPHIRE 2.0X12 (BALLOONS) ×2
BALLOON SAPPHIRE 2.0X12 (BALLOONS) IMPLANT
CATH 5FR JL3.5 JR4 ANG PIG MP (CATHETERS) ×1 IMPLANT
CATH VISTA GUIDE 6FR AL1 (CATHETERS) ×1 IMPLANT
DEVICE RAD COMP TR BAND LRG (VASCULAR PRODUCTS) ×1 IMPLANT
GLIDESHEATH SLEND A-KIT 6F 22G (SHEATH) ×1 IMPLANT
GUIDEWIRE INQWIRE 1.5J.035X260 (WIRE) IMPLANT
INQWIRE 1.5J .035X260CM (WIRE) ×2
KIT ENCORE 26 ADVANTAGE (KITS) ×1 IMPLANT
KIT HEART LEFT (KITS) ×2 IMPLANT
PACK CARDIAC CATHETERIZATION (CUSTOM PROCEDURE TRAY) ×2 IMPLANT
SHEATH PINNACLE 6F 10CM (SHEATH) ×1 IMPLANT
STENT SYNERGY XD 4.0X16 (Permanent Stent) IMPLANT
SYNERGY XD 4.0X16 (Permanent Stent) ×2 IMPLANT
TRANSDUCER W/STOPCOCK (MISCELLANEOUS) ×2 IMPLANT
TUBING CIL FLEX 10 FLL-RA (TUBING) ×2 IMPLANT
WIRE ASAHI PROWATER 180CM (WIRE) ×1 IMPLANT
WIRE EMERALD 3MM-J .035X150CM (WIRE) ×1 IMPLANT

## 2021-04-03 NOTE — Progress Notes (Addendum)
Site area: Right groin a 6 french arterial sheath was removed  Site Prior to Removal:  Level 0  Pressure Applied For 20 MINUTES    Bedrest Beginning at 1730p X4 hours  Manual:   Yes.    Patient Status During Pull:  stable  Post Pull Groin Site:  Level 0  Post Pull Instructions Given:  Yes.    Post Pull Pulses Present:  Yes.    Dressing Applied:  Yes.    Comments:

## 2021-04-03 NOTE — Progress Notes (Signed)
Placed patient on CPAP for the night vias auto-mode.

## 2021-04-03 NOTE — Interval H&P Note (Signed)
Cath Lab Visit (complete for each Cath Lab visit)  Clinical Evaluation Leading to the Procedure:   ACS: Yes.    Non-ACS:    Anginal Classification: CCS III  Anti-ischemic medical therapy: Maximal Therapy (2 or more classes of medications)  Non-Invasive Test Results: Low-risk stress test findings: cardiac mortality <1%/year  Prior CABG: Previous CABG      History and Physical Interval Note:  04/03/2021 11:55 AM  Andre Miranda  has presented today for surgery, with the diagnosis of angina.  The various methods of treatment have been discussed with the patient and family. After consideration of risks, benefits and other options for treatment, the patient has consented to  Procedure(s): LEFT HEART CATH AND CORS/GRAFTS ANGIOGRAPHY (N/A) as a surgical intervention.  The patient's history has been reviewed, patient examined, no change in status, stable for surgery.  I have reviewed the patient's chart and labs.  Questions were answered to the patient's satisfaction.     Andre Miranda III

## 2021-04-04 ENCOUNTER — Encounter (HOSPITAL_COMMUNITY): Payer: Self-pay | Admitting: Interventional Cardiology

## 2021-04-04 ENCOUNTER — Other Ambulatory Visit: Payer: Self-pay

## 2021-04-04 DIAGNOSIS — Z886 Allergy status to analgesic agent status: Secondary | ICD-10-CM | POA: Diagnosis not present

## 2021-04-04 DIAGNOSIS — Z952 Presence of prosthetic heart valve: Secondary | ICD-10-CM

## 2021-04-04 DIAGNOSIS — E785 Hyperlipidemia, unspecified: Secondary | ICD-10-CM | POA: Diagnosis not present

## 2021-04-04 DIAGNOSIS — I25700 Atherosclerosis of coronary artery bypass graft(s), unspecified, with unstable angina pectoris: Secondary | ICD-10-CM | POA: Diagnosis not present

## 2021-04-04 DIAGNOSIS — I2510 Atherosclerotic heart disease of native coronary artery without angina pectoris: Secondary | ICD-10-CM | POA: Diagnosis not present

## 2021-04-04 DIAGNOSIS — Z79899 Other long term (current) drug therapy: Secondary | ICD-10-CM | POA: Diagnosis not present

## 2021-04-04 DIAGNOSIS — G4733 Obstructive sleep apnea (adult) (pediatric): Secondary | ICD-10-CM | POA: Diagnosis not present

## 2021-04-04 DIAGNOSIS — I6529 Occlusion and stenosis of unspecified carotid artery: Secondary | ICD-10-CM | POA: Diagnosis not present

## 2021-04-04 DIAGNOSIS — I10 Essential (primary) hypertension: Secondary | ICD-10-CM

## 2021-04-04 DIAGNOSIS — I209 Angina pectoris, unspecified: Secondary | ICD-10-CM | POA: Diagnosis not present

## 2021-04-04 DIAGNOSIS — E669 Obesity, unspecified: Secondary | ICD-10-CM | POA: Diagnosis not present

## 2021-04-04 DIAGNOSIS — E7800 Pure hypercholesterolemia, unspecified: Secondary | ICD-10-CM

## 2021-04-04 DIAGNOSIS — Z7982 Long term (current) use of aspirin: Secondary | ICD-10-CM | POA: Diagnosis not present

## 2021-04-04 DIAGNOSIS — Z87891 Personal history of nicotine dependence: Secondary | ICD-10-CM | POA: Diagnosis not present

## 2021-04-04 DIAGNOSIS — I110 Hypertensive heart disease with heart failure: Secondary | ICD-10-CM | POA: Diagnosis not present

## 2021-04-04 DIAGNOSIS — I5033 Acute on chronic diastolic (congestive) heart failure: Secondary | ICD-10-CM | POA: Diagnosis not present

## 2021-04-04 LAB — CBC
HCT: 37 % — ABNORMAL LOW (ref 39.0–52.0)
Hemoglobin: 12.4 g/dL — ABNORMAL LOW (ref 13.0–17.0)
MCH: 32.7 pg (ref 26.0–34.0)
MCHC: 33.5 g/dL (ref 30.0–36.0)
MCV: 97.6 fL (ref 80.0–100.0)
Platelets: 130 10*3/uL — ABNORMAL LOW (ref 150–400)
RBC: 3.79 MIL/uL — ABNORMAL LOW (ref 4.22–5.81)
RDW: 13.2 % (ref 11.5–15.5)
WBC: 6.7 10*3/uL (ref 4.0–10.5)
nRBC: 0 % (ref 0.0–0.2)

## 2021-04-04 LAB — BASIC METABOLIC PANEL
Anion gap: 4 — ABNORMAL LOW (ref 5–15)
BUN: 19 mg/dL (ref 8–23)
CO2: 24 mmol/L (ref 22–32)
Calcium: 8.9 mg/dL (ref 8.9–10.3)
Chloride: 107 mmol/L (ref 98–111)
Creatinine, Ser: 1.06 mg/dL (ref 0.61–1.24)
GFR, Estimated: >60 mL/min (ref >60)
Glucose, Bld: 92 mg/dL (ref 70–99)
Potassium: 5 mmol/L (ref 3.5–5.1)
Sodium: 135 mmol/L (ref 135–145)

## 2021-04-04 LAB — POCT ACTIVATED CLOTTING TIME
Activated Clotting Time: 184 seconds
Activated Clotting Time: 190 seconds
Activated Clotting Time: 696 seconds

## 2021-04-04 MED ORDER — CLOPIDOGREL BISULFATE 75 MG PO TABS: 75.0000 mg | ORAL_TABLET | Freq: Every day | ORAL | 3 refills | Status: DC

## 2021-04-04 MED FILL — Verapamil HCl IV Soln 2.5 MG/ML: INTRAVENOUS | Qty: 2 | Status: AC

## 2021-04-04 MED FILL — Lidocaine HCl Local Preservative Free (PF) Inj 1%: INTRAMUSCULAR | Qty: 30 | Status: AC

## 2021-04-04 NOTE — Progress Notes (Signed)
CARDIAC REHAB PHASE I   PRE:  Rate/Rhythm: paced 75  BP:  Supine:   Sitting: 142/80  Standing:    SaO2: 97%RA  MODE:  Ambulation: 550 ft   POST:  Rate/Rhythm: 93 paced  BP:  Supine:   Sitting: 151/74  Standing:    SaO2: 99%RA 0934-1020 Pt walked 550 ft on RA with steady gait and no CP. Tolerated well. Discussed importance of plavix with stent. Reviewed NTG use, walking for ex, heart healthy and low sodium food choices, and CRP 2. Pt stated he has attended in the past.. referral to [facility] made.    Graylon Good, RN BSN  04/04/2021 10:15 AM

## 2021-04-04 NOTE — Discharge Instructions (Signed)
No lifting over 5 lbs for 1 week. No sexual activity for 1 week. Keep procedure site clean & dry. If you notice increased pain, swelling, bleeding or pus, call/return!  You may shower, but no soaking baths/hot tubs/pools for 1 week °

## 2021-04-04 NOTE — Discharge Summary (Signed)
Discharge Summary    Patient ID: Andre Miranda MRN: [id number]; DOB: [birth year]  Admit date: 04/03/2021 Discharge date: 04/04/2021  PCP:  Crist Infante, MD   [facility] Providers Cardiologist:  Sinclair Grooms, MD   {   Discharge Diagnoses    Principal Problem:   Angina pectoris [facility]) Active Problems:   Obstructive sleep apnea   CAD (coronary artery disease)   S/P TAVR (transcatheter aortic valve replacement)   Accelerated hypertension   Acute on chronic diastolic CHF (congestive heart failure) ([place])   CAD (coronary artery disease) of bypass graft    Diagnostic Studies/Procedures    Cath 04/03/2021  A stent was successfully placed.    The native left and right coronary arteries were not selectively visualized having been previously documented to be totally occluded.  The left internal mammary artery was not selectively visualized having been widely patent on prior angiogram 18 months ago and with no evidence of ischemia on nuclear scintigraphy within 3 to 4 weeks prior to this study.  The right internal mammary was nonselectively engaged and was widely patent.  There was moderate diffuse disease in the grafted PDA and left ventricular branches.  The saphenous vein sequential graft to the ramus intermedius and circumflex contained 99% proximal stenosis followed by moderate diffuse disease with areas up to 50% throughout the entire grafted segment.  The side-to-side anastomosis with the ramus intermedius is totally occluded.  Successful stenting of the proximal saphenous vein graft using a 4.0 x 16 Synergy stent deployed at 11 atm x 2.  TIMI grade III flow was maintained.  Pretreatment with 100 mcg of intracoronary verapamil was given.  Slow flow/no reflow was not noted at any time during the procedure.  Distal protection was not chosen due to the diffuse nature of disease in the mid and distal body of the saphenous vein graft.  RECOMMENDATIONS:   Discharge  tomorrow a.m.  Being kept overnight because of femoral approach in this elderly gentleman.  Also had a right radial approach to facilitate visualization of the right internal mammary artery catheter.  Aspirin and Plavix for 6 months.   _____________   History of Present Illness     Andre Miranda is a 85 y.o. 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 Acute on chronic diastolic heart failure resolved after repeat TAVR.  His wife.  Despite adding isosorbide he continues to have angina that disrupts daily activities.  Walking to the mailbox causes chest tightness.  Nitroglycerin helps relieve the discomfort.  It does not occur at rest.  He wonders if this could be gastrointestinal because it causes him to burp.  He has not had prolonged pain unrelieved by nitroglycerin.  At the last office visit up titration of medical therapy was attempted by adding isosorbide mononitrate 30 mg/day.  A nuclear perfusion study was performed and is outlined below.  It was low risk in appearance.  There could be matched ischemia.  Symptoms certainly are slowly progressive over time.  I reviewed the most recent coronary angiogram from 2020.  He had diffuse LAD disease beyond the LIMA insertion site.  The LIMA was widely patent.  The last couple angiograms demonstrated that the [place] was patent but could never be selectively engaged from the femoral approach.  Therefore the distal  right coronary bed has not been fully investigated.  Saphenous vein graft to the diagonal was widely patent but did have nonobstructive disease.  The native right and left coronaries were totally occluded.  Hospital Course     Consultants: N/A  Patient  underwent outpatient cardiac catheterization procedure by Dr. Smith on 04/03/2021, he was noted to have 99% disease in proximal SVG to ramus intermedius and left circumflex artery, this was treated with a single drug-eluting stent.  Postprocedure, he did well.  He was kept overnight for observation.  In the morning of 5/19, he did have mild chest discomfort that quickly went away.  This did not feel like her typical angina.  She ambulated in the room without any exertional chest discomfort.  Cath site appears to be stable without any hematoma or active bleeding.  He has been instructed not to lift anything greater than 5 pounds for 1 week.  He is not on a beta-blocker due to bradycardia.  Patient was seen by cardiac rehab and was able to walk 550 feet unsteady gait with no chest pain.  He is deemed stable for discharge from cardiac perspective.   Did the patient have an acute coronary syndrome (MI, NSTEMI, STEMI, etc) this admission?:  No                               Did the patient have a percutaneous coronary intervention (stent / angioplasty)?:  Yes.     Cath/PCI Registry Performance & Quality Measures: 1. Aspirin prescribed? - Yes 2. ADP Receptor Inhibitor (Plavix/Clopidogrel, Brilinta/Ticagrelor or Effient/Prasugrel) prescribed (includes medically managed patients)? - Yes 3. High Intensity Statin (Lipitor 40-80mg or Crestor 20-40mg) prescribed? - Yes 4. For EF <40%, was ACEI/ARB prescribed? - Not Applicable (EF >/= 40%) 5. For EF <40%, Aldosterone Antagonist (Spironolactone or Eplerenone) prescribed? - Not Applicable (EF >/= 40%) 6. Cardiac Rehab Phase II ordered? - Yes       _____________  Discharge Vitals Blood pressure (!) 136/56, pulse 64, temperature 97.8 F (36.6 C), temperature source Oral, resp. rate 16, height 5' 10" (1.778 m), weight 100.7 kg, SpO2 100 %.  Filed Weights   04/03/21 0957  Weight: 100.7 kg    Labs & Radiologic Studies    CBC Recent Labs    04/03/21 1450  04/04/21 0215  WBC 5.5 6.7  HGB 11.9* 12.4*  HCT 34.6* 37.0*  MCV 95.8 97.6  PLT 136* 130*   Basic Metabolic Panel Recent Labs    04/03/21 1450 04/04/21 0215  NA  --  135  K  --  5.0  CL  --  107  CO2  --  24  GLUCOSE  --  92  BUN  --  19  CREATININE 0.75 1.06  CALCIUM  --  8.9   Liver Function Tests No results for input(s): AST, ALT, ALKPHOS, BILITOT, PROT, ALBUMIN in the last 72 hours. No results for input(s): LIPASE, AMYLASE in the last 72 hours. High Sensitivity Troponin:   No results for input(s): TROPONINIHS in the last 720 hours.  BNP Invalid input(s): POCBNP D-Dimer No results for input(s): DDIMER in the last 72 hours. Hemoglobin A1C No results for input(s): HGBA1C in the last 72 hours. Fasting Lipid Panel No results for input(s): CHOL, HDL, LDLCALC, TRIG, CHOLHDL, LDLDIRECT in the last 72 hours. Thyroid Function Tests No results for input(s): TSH, T4TOTAL, T3FREE, THYROIDAB in the last 72 hours.  Invalid input(s): FREET3 _____________    CARDIAC CATHETERIZATION  Addendum Date: 04/04/2021    A stent was successfully placed.   The native left and right coronary arteries were not selectively visualized having been previously documented to be totally occluded.  The left internal mammary artery was not selectively visualized having been widely patent on prior angiogram 18 months ago and with no evidence of ischemia on nuclear scintigraphy within 3 to 4 weeks prior to this study.  The right internal mammary was nonselectively engaged and was widely patent.  There was moderate diffuse disease in the grafted PDA and left ventricular branches.  The saphenous vein sequential graft to the ramus intermedius and circumflex contained 99% proximal stenosis followed by moderate diffuse disease with areas up to 50% throughout the entire grafted segment.  The side-to-side anastomosis with the ramus intermedius is totally occluded.  Successful stenting of the proximal saphenous  vein graft using a 4.0 x 16 Synergy stent deployed at 11 atm x 2.  TIMI grade III flow was maintained.  Pretreatment with 100 mcg of intracoronary verapamil was given.  Slow flow/no reflow was not noted at any time during the procedure.  Distal protection was not chosen due to the diffuse nature of disease in the mid and distal body of the saphenous vein graft. RECOMMENDATIONS:  Discharge tomorrow a.m.  Being kept overnight because of femoral approach in this elderly gentleman.  Also had a right radial approach to facilitate visualization of the right internal mammary artery catheter.  Aspirin and Plavix for 6 months.   Result Date: 04/03/2021  A stent was successfully placed.   The negative left and right coronary arteries were not selectively visualized having been previously documented to be totally occluded.  The left internal mammary artery was not selectively visualized having been widely patent on prior angiogram 18 months ago and with no evidence of ischemia on nuclear scintigraphy within 3 to 4 weeks prior to this study.  The right internal mammary was nonselectively engaged and was widely patent.  There was moderate diffuse disease in the grafted PDA and left ventricular branches.  The saphenous vein sequential graft to the ramus intermedius and circumflex contained 99% proximal stenosis followed by moderate diffuse disease with areas up to 50% throughout the entire grafted segment.  The side-to-side anastomosis with the ramus intermedius is totally occluded.  Successful stenting of the proximal saphenous vein graft using a 4.0 x 16 Synergy stent deployed at 11 atm x 2.  TIMI grade III flow was maintained.  Pretreatment with 100 mcg of intracoronary verapamil was given.  Slow flow/no reflow was not noted at any time during the procedure.  Distal protection was not chosen due to the diffuse nature of disease in the mid and distal body of the saphenous vein graft. RECOMMENDATIONS:  Discharge  tomorrow a.m.  Being kept overnight because of femoral approach in this elderly gentleman.  Also had a right radial approach to facilitate visualization of the right internal mammary artery catheter.  Aspirin and Plavix for 6 months.   MYOCARDIAL PERFUSION IMAGING  Result Date: 03/06/2021  Nuclear stress EF: 55%.  The left ventricular ejection fraction is normal (55-65%).  There was no ST segment deviation noted during stress.  The study is normal.  This is a low risk study.  Normal resting and stress perfusion. No ischemia or infarction EF 55%   Disposition   Pt is being discharged home today in good condition.  Follow-up Plans & Appointments     Follow-up Information      Smith, Henry W, MD Follow up on 04/22/2021.   Specialty: Cardiology Why: 12:00PM. Cardiology follow up Contact information: [address] [place] [address] [phone]              Discharge Instructions    Amb Referral to Cardiac Rehabilitation   Complete by: As directed    Diagnosis: Coronary Stents   After initial evaluation and assessments completed: Virtual Based Care may be provided alone or in conjunction with Phase 2 Cardiac Rehab based on patient barriers.: Yes   Diet - low sodium heart healthy   Complete by: As directed    Increase activity slowly   Complete by: As directed       Discharge Medications   Allergies as of 04/04/2021      Reactions   Ibuprofen Hypertension      Medication List    TAKE these medications   acetaminophen 500 MG tablet Commonly known as: TYLENOL Take 500 mg by mouth every 6 (six) hours as needed for moderate pain.   allopurinol 300 MG tablet Commonly known as: ZYLOPRIM Take 300 mg by mouth daily.   amLODipine 5 MG tablet Commonly known as: NORVASC Take 1 tablet by mouth once daily   aspirin EC 81 MG tablet Take 81 mg by mouth at bedtime.   azithromycin 500 MG tablet Commonly known as: ZITHROMAX Take 500 mg by mouth See  admin instructions. Take 500 mg 1 hour prior to dental work   Centrum Silver tablet Take 1 tablet by mouth daily with breakfast.   clopidogrel 75 MG tablet Commonly known as: PLAVIX Take 1 tablet (75 mg total) by mouth daily with breakfast. Start taking on: Apr 05, 2021   famotidine 20 MG tablet Commonly known as: PEPCID Take 20 mg by mouth daily before breakfast.   furosemide 40 MG tablet Commonly known as: LASIX Take 1 tablet (40 mg total) by mouth daily.   isosorbide mononitrate 60 MG 24 hr tablet Commonly known as: IMDUR Take 1 tablet (60 mg total) by mouth daily.   nitroGLYCERIN 0.4 MG SL tablet Commonly known as: NITROSTAT Place 1 tablet (0.4 mg total) under the tongue every 5 (five) minutes as needed for chest pain.   polyethylene glycol powder 17 GM/SCOOP powder Commonly known as: GLYCOLAX/MIRALAX Take 17 g by mouth daily as needed for moderate constipation.   PRESCRIPTION MEDICATION CPAP: At bedtime   ramipril 5 MG capsule Commonly known as: ALTACE Take 1 capsule (5 mg total) by mouth at bedtime.   rosuvastatin 10 MG tablet Commonly known as: CRESTOR Take 10 mg by mouth every evening.   spironolactone 25 MG tablet Commonly known as: ALDACTONE Take 1 tablet (25 mg total) by mouth daily.   tamsulosin 0.4 MG Caps capsule Commonly known as: FLOMAX Take 0.4 mg by mouth every evening.   temazepam 15 MG capsule Commonly known as: RESTORIL Take 15-30 mg by mouth at bedtime as needed for sleep.   Vitamin D3 25 MCG (1000 UT) Caps Take 1,000 Units by mouth at bedtime.   Zeasorb-AF 2 % powder Generic drug: miconazole Apply 1 application topically as needed for itching.          Outstanding Labs/Studies   N/A  Duration of Discharge Encounter   Greater than 30 minutes including physician time.  Signed,  , PA 04/04/2021, 12:03 PM     

## 2021-04-04 NOTE — Progress Notes (Signed)
 Progress Note  Patient Name: Andre Miranda Date of Encounter: 04/04/2021  [facility] Cardiologist: Henry W Smith III, MD   Subjective   Woke up with a slight chest discomfort, resolved since. Ambulating ok without discomfort. No SOB.   Inpatient Medications    Scheduled Meds: . allopurinol  300 mg Oral Daily  . amLODipine  5 mg Oral Daily  . aspirin  81 mg Oral Daily  . clopidogrel  75 mg Oral Q breakfast  . famotidine  20 mg Oral QAC breakfast  . furosemide  40 mg Oral Daily  . heparin  5,000 Units Subcutaneous Q8H  . ramipril  5 mg Oral BID  . rosuvastatin  10 mg Oral QHS  . sodium chloride flush  3 mL Intravenous Q12H  . spironolactone  25 mg Oral Daily  . tamsulosin  0.4 mg Oral QHS   Continuous Infusions: . sodium chloride     PRN Meds: sodium chloride, acetaminophen, acetaminophen, nitroGLYCERIN, ondansetron (ZOFRAN) IV, oxyCODONE, sodium chloride flush, temazepam   Vital Signs    Vitals:   04/03/21 2202 04/03/21 2229 04/03/21 2327 04/04/21 0605  BP: 140/70 (!) 131/54  (!) 136/56  Pulse:   75 64  Resp:   18 16  Temp:  97.8 F (36.6 C)  97.8 F (36.6 C)  TempSrc:  Oral  Oral  SpO2:   95% 100%  Weight:      Height:        Intake/Output Summary (Last 24 hours) at 04/04/2021 0854 Last data filed at 04/03/2021 2359 Gross per 24 hour  Intake 260.1 ml  Output 175 ml  Net 85.1 ml   Last 3 Weights 04/03/2021 04/01/2021 03/06/2021  Weight (lbs) 222 lb 228 lb 3.2 oz 226 lb  Weight (kg) 100.699 kg 103.511 kg 102.513 kg      Telemetry    Sinus without significant ventricular ectopy - Personally Reviewed  ECG    Sinus with RBBB and PVCs - Personally Reviewed  Physical Exam   GEN: No acute distress.   Neck: No JVD Cardiac: RRR, no rubs, or gallops. R radial and groin cath site stable, no hematoma or bleeding. 3/6 systolic murmur Respiratory: Clear to auscultation bilaterally. GI: Soft, nontender, non-distended  MS: No edema; No  deformity. Neuro:  Nonfocal  Psych: Normal affect   Labs    High Sensitivity Troponin:  No results for input(s): TROPONINIHS in the last 720 hours.    Chemistry Recent Labs  Lab 04/01/21 1008 04/03/21 1450 04/04/21 0215  NA 135  --  135  K 4.6  --  5.0  CL 98  --  107  CO2 25  --  24  GLUCOSE 112*  --  92  BUN 24  --  19  CREATININE 1.02 0.75 1.06  CALCIUM 9.5  --  8.9  GFRNONAA  --  >60 >60  ANIONGAP  --   --  4*     Hematology Recent Labs  Lab 04/01/21 1008 04/03/21 1450 04/04/21 0215  WBC 6.5 5.5 6.7  RBC 4.26 3.61* 3.79*  HGB 13.7 11.9* 12.4*  HCT 40.6 34.6* 37.0*  MCV 95 95.8 97.6  MCH 32.2 33.0 32.7  MCHC 33.7 34.4 33.5  RDW 13.6 13.1 13.2  PLT 152 136* 130*    BNPNo results for input(s): BNP, PROBNP in the last 168 hours.   DDimer No results for input(s): DDIMER in the last 168 hours.   Radiology    CARDIAC CATHETERIZATION  Result Date:   04/03/2021  A stent was successfully placed.   The negative left and right coronary arteries were not selectively visualized having been previously documented to be totally occluded.  The left internal mammary artery was not selectively visualized having been widely patent on prior angiogram 18 months ago and with no evidence of ischemia on nuclear scintigraphy within 3 to 4 weeks prior to this study.  The right internal mammary was nonselectively engaged and was widely patent.  There was moderate diffuse disease in the grafted PDA and left ventricular branches.  The saphenous vein sequential graft to the ramus intermedius and circumflex contained 99% proximal stenosis followed by moderate diffuse disease with areas up to 50% throughout the entire grafted segment.  The side-to-side anastomosis with the ramus intermedius is totally occluded.  Successful stenting of the proximal saphenous vein graft using a 4.0 x 16 Synergy stent deployed at 11 atm x 2.  TIMI grade III flow was maintained.  Pretreatment with 100 mcg of  intracoronary verapamil was given.  Slow flow/no reflow was not noted at any time during the procedure.  Distal protection was not chosen due to the diffuse nature of disease in the mid and distal body of the saphenous vein graft. RECOMMENDATIONS:  Discharge tomorrow a.m.  Being kept overnight because of femoral approach in this elderly gentleman.  Also had a right radial approach to facilitate visualization of the right internal mammary artery catheter.  Aspirin and Plavix for 6 months.    Cardiac Studies   Cath 04/03/2021   A stent was successfully placed.    The negative left and right coronary arteries were not selectively visualized having been previously documented to be totally occluded.  The left internal mammary artery was not selectively visualized having been widely patent on prior angiogram 18 months ago and with no evidence of ischemia on nuclear scintigraphy within 3 to 4 weeks prior to this study.  The right internal mammary was nonselectively engaged and was widely patent.  There was moderate diffuse disease in the grafted PDA and left ventricular branches.  The saphenous vein sequential graft to the ramus intermedius and circumflex contained 99% proximal stenosis followed by moderate diffuse disease with areas up to 50% throughout the entire grafted segment.  The side-to-side anastomosis with the ramus intermedius is totally occluded.  Successful stenting of the proximal saphenous vein graft using a 4.0 x 16 Synergy stent deployed at 11 atm x 2.  TIMI grade III flow was maintained.  Pretreatment with 100 mcg of intracoronary verapamil was given.  Slow flow/no reflow was not noted at any time during the procedure.  Distal protection was not chosen due to the diffuse nature of disease in the mid and distal body of the saphenous vein graft.  RECOMMENDATIONS:   Discharge tomorrow a.m.  Being kept overnight because of femoral approach in this elderly gentleman.  Also had a  right radial approach to facilitate visualization of the right internal mammary artery catheter.  Aspirin and Plavix for 6 months.    Patient Profile     84 y.o. male with PMH of CAD s/p CABG 2003, aortic stenosis s/p TAVR 2016 at [facility] and repeat TAVR 10/12/2019, HLD, HTN and chronic diastolic CHF.  He underwent outpatient cath on 5/18, received DES to sequential SVG to ramus and LCx.   Assessment & Plan    1. Unstable angina  - underwent DES PCI of SVG to ramus and LCx  - continue aspirin and plavix  - slight chest   discomfort discomfort after waiting this morning. This has resolved. Continue the current therapy, ambulate with cardiac rehab later, if no chest pain with ambulation, plan to discharge today  2. CAD s/p CABG 2003  3. Aortic stenosis s/p TAVR 2016 at [facility] and repeat TAVR 10/12/2019  4. HTN  5. HLD  6. Chronic diastolic heart failure: euvolemic on exam. Continue home diuretic.    For questions or updates, please contact [facility] Please consult [email or web address] for contact info under        Signed,  , PA  04/04/2021, 8:54 AM    

## 2021-04-10 ENCOUNTER — Telehealth (HOSPITAL_COMMUNITY): Payer: Self-pay

## 2021-04-10 NOTE — Telephone Encounter (Signed)
Attempted to call patient in regards to Cardiac Rehab - LM on VM 

## 2021-04-10 NOTE — Telephone Encounter (Signed)
Pt insurance is active and benefits verified through Medicare A/B. Co-pay $0.00, DED $233.00/$233.00 met, out of pocket $0.00/$0.00 met, co-insurance 20%. No pre-authorization required. Passport, 04/10/21 @ 2:21PM, KIC#[id number]  2ndary insurance is active and benefits verified through [employer]. Co-pay $0.00, DED $0.00/$0.00 met, out of pocket $0.00/$0.00 met, co-insurance 0%. No pre-authorization required. Passport, 04/10/21 @ 2:24PM, ZBF#[id number]  Will contact patient to see if he is interested in the Cardiac Rehab Program. If interested, patient will need to complete follow up appt. Once completed, patient will be contacted for scheduling upon review by the RN Navigator.

## 2021-04-11 ENCOUNTER — Telehealth (HOSPITAL_COMMUNITY): Payer: Self-pay

## 2021-04-11 NOTE — Telephone Encounter (Signed)
Patient called and is interested in the Cardiac Rehab Program. Patient expressed interest. Explained scheduling process and went over insurance, patient verbalized understanding. Will contact patient for scheduling once f/u has been completed.

## 2021-04-21 NOTE — Progress Notes (Signed)
Cardiology Office Note:    Date:  04/22/2021   ID:  Andre Miranda, DOB [birth year], MRN [id number]  PCP:  Crist Infante, MD  Cardiologist:  Sinclair Grooms, MD   Referring MD: Crist Infante, MD   Chief Complaint  Patient presents with  . Coronary Artery Disease    History of Present Illness:    Andre Miranda is a 85 y.o. 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 Acute on chronic diastolic heart failure resolved after repeat TAVR.  Andre Miranda is doing relatively well.  Angina has markedly decreased after stenting the graft to the ramus intermedius/obtuse marginal.  The side to side anastomosis with the ramus is totally occluded.  It was a smaller branch than the obtuse marginal.  He has not had angina at rest.  He has some mild chest discomfort with walking.  He has not tried to exercise yet.  He has been taking it easy since the intervention in late May.  We used both the right radial and right femoral approach.  This was because of inability to cannulate the vein graft from the right radial approach due to his aortic orientation and size.  Past Medical History:  Diagnosis Date  . Aortic stenosis    MODERATE  . Cancer [facility])    prostate cancer-radiation  . Carotid arterial disease ([place])   . Coronary artery disease   . Dysuria   . GERD (gastroesophageal reflux disease)   . Gout   . Hematuria   . History of urinary retention   . Hyperlipidemia   . Hypertension   . Obesity   . Sleep apnea    uses CPAP nightly    Past Surgical History:  Procedure Laterality Date  . AORTIC VALVE REPAIR  February 06, 2015   Dr. Aline Brochure and  Dr. Ysidro Evert  at [facility]  . BIOPSY  09/26/2019   Procedure: BIOPSY;  Surgeon: Otis Brace, MD;  Location: [facility] ENDOSCOPY;  Service: Gastroenterology;;  . CAROTID ENDARTERECTOMY  02/2008  . CHOLECYSTECTOMY  04/2002  . COLONOSCOPY WITH PROPOFOL Left 09/26/2019   Procedure: COLONOSCOPY WITH PROPOFOL;  Surgeon: Otis Brace, MD;  Location: [place];  Service: Gastroenterology;  Laterality: Left;  . CORONARY ARTERY BYPASS GRAFT  2003   LIMA TO THE LAD, RIMA TO THE RCA, AND A SAPHENOUS VEIN GRAFT TO THE INTERMEDIATE AND DISTAL LEFT CIRCUMFLEX  . CORONARY STENT INTERVENTION N/A 04/03/2021   Procedure: CORONARY STENT INTERVENTION;  Surgeon: Belva Crome, MD;  Location: [place] CV LAB;  Service: Cardiovascular;  Laterality: N/A;  . CORONARY/GRAFT ANGIOGRAPHY N/A 09/27/2019   Procedure: CORONARY/GRAFT ANGIOGRAPHY;  Surgeon: Belva Crome, MD;  Location: [place] CV LAB;  Service: Cardiovascular;  Laterality: N/A;  . CORONARY/GRAFT ANGIOGRAPHY N/A 04/03/2021   Procedure: CORONARY/GRAFT ANGIOGRAPHY;  Surgeon: Belva Crome, MD;  Location: [place] CV LAB;  Service: Cardiovascular;  Laterality: N/A;  . ESOPHAGOGASTRODUODENOSCOPY (EGD) WITH PROPOFOL Left 09/26/2019   Procedure: ESOPHAGOGASTRODUODENOSCOPY (EGD) WITH PROPOFOL;  Surgeon: Otis Brace, MD;  Location: [facility] ENDOSCOPY;  Service: Gastroenterology;  Laterality: Left;  . ORIF ANKLE FRACTURE Right 02/14/2019   Procedure: OPEN REDUCTION INTERNAL FIXATION (ORIF) ANKLE FRACTURE;  Surgeon: Netta Cedars, MD;  Location: [facility];  Service: Orthopedics;  Laterality: Right;  . POLYPECTOMY  09/26/2019   Procedure: POLYPECTOMY;  Surgeon: Otis Brace, MD;  Location: [place] ENDOSCOPY;  Service: Gastroenterology;;  . US ECHOCARDIOGRAPHY  08/2009   SHOWED A MEAN GRADIENT ACROSS IS AORTIC VALVE OF 24 MM OF MERCURY. HE HAD MODERATE LVH AND NORMAL LV FUNCTION    Current Medications: Current Meds  Medication Sig  .  acetaminophen (TYLENOL) 500 MG tablet Take 500 mg by mouth every 6 (six) hours as needed for moderate pain.  Marland Kitchen allopurinol (ZYLOPRIM) 300 MG tablet Take 300 mg by mouth daily.  Marland Kitchen amLODipine (NORVASC) 5 MG tablet Take 1 tablet by mouth once daily  . aspirin EC 81 MG tablet Take 81 mg by mouth at bedtime.   Marland Kitchen azithromycin (ZITHROMAX) 500 MG tablet Take 500 mg by mouth See admin instructions. Take 500 mg 1 hour prior to dental work  . Cholecalciferol (VITAMIN D3) 1000 UNITS CAPS Take 1,000 Units by mouth at bedtime.  . clopidogrel (PLAVIX) 75 MG tablet Take 1 tablet (75 mg total) by mouth daily with breakfast.  . famotidine (PEPCID) 20 MG tablet Take 20 mg by mouth daily before breakfast.  . furosemide (LASIX) 40 MG tablet Take 1 tablet (40 mg total) by mouth daily.  . isosorbide mononitrate (IMDUR) 60 MG 24 hr tablet Take 1 tablet (60 mg total) by mouth daily.  . miconazole (ZEASORB-AF) 2 % powder Apply 1 application topically as needed for itching.  . Multiple Vitamins-Minerals (CENTRUM SILVER) tablet Take 1 tablet by mouth daily with breakfast.  . nitroGLYCERIN (NITROSTAT) 0.4 MG SL tablet Place 1 tablet (0.4 mg total) under the tongue every 5 (five) minutes as needed for chest pain.  . polyethylene glycol powder (GLYCOLAX/MIRALAX) 17 GM/SCOOP powder Take 17 g by mouth daily as needed for moderate constipation.  Marland Kitchen PRESCRIPTION MEDICATION CPAP: At bedtime  . ramipril (ALTACE) 5 MG capsule Take 1 capsule (5 mg total) by mouth at bedtime.  . rosuvastatin (CRESTOR) 10 MG tablet Take 10 mg by mouth every evening.  Marland Kitchen spironolactone (ALDACTONE) 25 MG tablet Take 1 tablet (25 mg total) by mouth daily.  . Tamsulosin HCl (FLOMAX) 0.4 MG CAPS Take 0.4 mg by mouth every evening.  . temazepam (RESTORIL) 15 MG capsule Take 15-30 mg by mouth at bedtime as needed for sleep.     Allergies:   Ibuprofen   Social History   Socioeconomic History  . Marital status: Married    Spouse name: Not on file  .  Number of children: Not on file  . Years of education: Not on file  . Highest education level: Not on file  Occupational History  . Not on file  Tobacco Use  . Smoking status: Former Smoker    Packs/day: 1.50    Years: 7.00    Pack years: 10.50    Types: Cigarettes    Quit date: 04/03/1971    Years since quitting: 50.0  . Smokeless tobacco: Never Used  Vaping Use  . Vaping Use: Never used  Substance and Sexual Activity  . Alcohol use: Yes    Comment: social  . Drug use: No  . Sexual activity: Not on file  Other Topics Concern  . Not on file  Social History Narrative  . Not on file   Social Determinants of Health   Financial Resource Strain: Not on file  Food Insecurity: Not on file  Transportation Needs: Not on file  Physical Activity: Not on file  Stress: Not on file  Social Connections: Not on file     Family History: The patient's family history includes Heart attack in his father; Pneumonia (age of onset: 41) in his mother.  ROS:   Please see the history of present illness.    Wants to know if he can exercise and do other physical activities.  All other systems reviewed and are negative.  EKGs/Labs/Other Studies Reviewed:    The following studies were reviewed today:  CATH PCI 03/2021: Diagnostic Dominance: Right    Intervention      EKG:  EKG none done today.  Last tracing reveal right bundle branch block, sinus rhythm, PAC.  Recent Labs: 04/04/2021: BUN 19; Creatinine, Ser 1.06; Hemoglobin 12.4; Platelets 130; Potassium 5.0; Sodium 135  Recent Lipid Panel    Component Value Date/Time   CHOL 90 04/23/2019 0249   TRIG 79 04/23/2019 0249   HDL 32 (L) 04/23/2019 0249   CHOLHDL 2.8 04/23/2019 0249   VLDL 16 04/23/2019 0249   LDLCALC 42 04/23/2019 0249    Physical Exam:    VS:  BP 110/60 (BP Location: Left Arm, Patient Position: Sitting, Cuff Size: Normal)   Pulse 70   Ht 5\' 10"  (1.778 m)   Wt 224 lb (101.6 kg)   SpO2 98%   BMI 32.14 kg/m      Wt Readings from Last 3 Encounters:  04/22/21 224 lb (101.6 kg)  04/03/21 222 lb (100.7 kg)  04/01/21 228 lb 3.2 oz (103.5 kg)     GEN: Obese.. No acute distress HEENT: Normal NECK: No JVD. LYMPHATICS: No lymphadenopathy CARDIAC: 3/6 crescendo decrescendo systolic prosthetic valve murmur. RRR no gallop, or edema. VASCULAR:  Normal Pulses. No bruits. RESPIRATORY:  Clear to auscultation without rales, wheezing or rhonchi  ABDOMEN: Soft, non-tender, non-distended, No pulsatile mass, MUSCULOSKELETAL: No deformity  SKIN: Warm and dry NEUROLOGIC:  Alert and oriented x 3 PSYCHIATRIC:  Normal affect   ASSESSMENT:    1. Coronary artery disease involving native coronary artery of native heart with unstable angina pectoris ([place])   2. Essential hypertension   3. Chronic diastolic heart failure ([place])   4. RAS (renal artery stenosis) ([facility])   5. Other hyperlipidemia   6. Bilateral carotid artery occlusion   7. History of transcatheter aortic valve replacement (TAVR)    PLAN:    In order of problems listed above:  1. Use sublingual nitroglycerin as needed to control chest pain.  Activity should be limited by angina.  Otherwise no restrictions on physical activity.  Has significant conversation concerning durability of stented elderly bypass grafts to help him understand that he will likely need additional interventions in the not too distant future. 2. Blood pressure is excellent and related to amlodipine, Imdur, Altace, and Aldactone. 3. Consider SGLT2 therapy.  If this is accomplished, would consider discontinuation of Norvasc. 4. No new changes 5. Continue Crestor 10 mg/day. 6. Continue secondary prevention. 7. Continue to follow.  He has valve in valve TAVR.  Follow-up in 4 to 6 months.  Okay to return to cardiac rehab and or Silver sneakers.   Medication Adjustments/Labs and Tests Ordered: Current medicines are reviewed at length with the patient today.  Concerns regarding  medicines are outlined above.  No orders of the defined types were placed in this encounter.  No orders of the defined types were placed in this encounter.   Patient Instructions  Medication Instructions:  Your physician recommends that you continue on your current medications as directed.  Please refer to the Current Medication list given to you today.  *If you need a refill on your cardiac medications before your next appointment, please call your pharmacy*   Lab Work: None If you have labs (blood work) drawn today and your tests are completely normal, you will receive your results only by: Marland Kitchen MyChart Message (if you have MyChart) OR . A paper copy in the mail If you have any lab test that is abnormal or we need to change your treatment, we will call you to review the results.   Testing/Procedures: None   Follow-Up: At [facility], you and your health needs are our priority.  As part of our continuing mission to provide you with exceptional heart care, we have created designated Provider Care Teams.  These Care Teams include your primary Cardiologist (physician) and Advanced Practice Providers (APPs -  Physician Assistants and Nurse Practitioners) who all work together to provide you with the care you need, when you need it.  We recommend signing up for the patient portal called "MyChart".  Sign up information is provided on this After Visit Summary.  MyChart is used to connect with patients for Virtual Visits (Telemedicine).  Patients are able to view lab/test results, encounter notes, upcoming appointments, etc.  Non-urgent messages can be sent to your provider as well.   To learn more about what you can do with MyChart, go to [email or web address].    Your next appointment:   6 month(s)  The format for your next appointment:   In Person  Provider:   You may see Sinclair Grooms, MD or one of the following Advanced Practice Providers on your designated Care Team:     Kathyrn Drown, NP    Other Instructions      Signed, Sinclair Grooms, MD  04/22/2021 12:27 PM    [place]

## 2021-04-22 ENCOUNTER — Other Ambulatory Visit: Payer: Self-pay

## 2021-04-22 ENCOUNTER — Encounter: Payer: Self-pay | Admitting: Interventional Cardiology

## 2021-04-22 ENCOUNTER — Ambulatory Visit (INDEPENDENT_AMBULATORY_CARE_PROVIDER_SITE_OTHER): Payer: Medicare Other | Admitting: Interventional Cardiology

## 2021-04-22 VITALS — BP 110/60 | HR 70 | Ht 70.0 in | Wt 224.0 lb

## 2021-04-22 DIAGNOSIS — I10 Essential (primary) hypertension: Secondary | ICD-10-CM | POA: Diagnosis not present

## 2021-04-22 DIAGNOSIS — I5032 Chronic diastolic (congestive) heart failure: Secondary | ICD-10-CM

## 2021-04-22 DIAGNOSIS — I701 Atherosclerosis of renal artery: Secondary | ICD-10-CM

## 2021-04-22 DIAGNOSIS — E7849 Other hyperlipidemia: Secondary | ICD-10-CM

## 2021-04-22 DIAGNOSIS — I25110 Atherosclerotic heart disease of native coronary artery with unstable angina pectoris: Secondary | ICD-10-CM | POA: Diagnosis not present

## 2021-04-22 DIAGNOSIS — I6523 Occlusion and stenosis of bilateral carotid arteries: Secondary | ICD-10-CM | POA: Diagnosis not present

## 2021-04-22 DIAGNOSIS — Z952 Presence of prosthetic heart valve: Secondary | ICD-10-CM | POA: Diagnosis not present

## 2021-04-22 NOTE — Patient Instructions (Signed)

## 2021-04-24 DIAGNOSIS — Z23 Encounter for immunization: Secondary | ICD-10-CM | POA: Diagnosis not present

## 2021-05-15 DIAGNOSIS — E785 Hyperlipidemia, unspecified: Secondary | ICD-10-CM | POA: Diagnosis not present

## 2021-05-15 DIAGNOSIS — M109 Gout, unspecified: Secondary | ICD-10-CM | POA: Diagnosis not present

## 2021-05-15 DIAGNOSIS — Z125 Encounter for screening for malignant neoplasm of prostate: Secondary | ICD-10-CM | POA: Diagnosis not present

## 2021-05-15 DIAGNOSIS — R946 Abnormal results of thyroid function studies: Secondary | ICD-10-CM | POA: Diagnosis not present

## 2021-05-15 DIAGNOSIS — R7301 Impaired fasting glucose: Secondary | ICD-10-CM | POA: Diagnosis not present

## 2021-05-16 DIAGNOSIS — I110 Hypertensive heart disease with heart failure: Secondary | ICD-10-CM | POA: Diagnosis not present

## 2021-05-16 DIAGNOSIS — E785 Hyperlipidemia, unspecified: Secondary | ICD-10-CM | POA: Diagnosis not present

## 2021-05-16 DIAGNOSIS — I509 Heart failure, unspecified: Secondary | ICD-10-CM | POA: Diagnosis not present

## 2021-05-22 DIAGNOSIS — C61 Malignant neoplasm of prostate: Secondary | ICD-10-CM | POA: Diagnosis not present

## 2021-05-22 DIAGNOSIS — Z952 Presence of prosthetic heart valve: Secondary | ICD-10-CM | POA: Diagnosis not present

## 2021-05-22 DIAGNOSIS — R809 Proteinuria, unspecified: Secondary | ICD-10-CM | POA: Diagnosis not present

## 2021-05-22 DIAGNOSIS — R82998 Other abnormal findings in urine: Secondary | ICD-10-CM | POA: Diagnosis not present

## 2021-05-22 DIAGNOSIS — E785 Hyperlipidemia, unspecified: Secondary | ICD-10-CM | POA: Diagnosis not present

## 2021-05-22 DIAGNOSIS — I509 Heart failure, unspecified: Secondary | ICD-10-CM | POA: Diagnosis not present

## 2021-05-22 DIAGNOSIS — R946 Abnormal results of thyroid function studies: Secondary | ICD-10-CM | POA: Diagnosis not present

## 2021-05-22 DIAGNOSIS — I350 Nonrheumatic aortic (valve) stenosis: Secondary | ICD-10-CM | POA: Diagnosis not present

## 2021-05-22 DIAGNOSIS — I2510 Atherosclerotic heart disease of native coronary artery without angina pectoris: Secondary | ICD-10-CM | POA: Diagnosis not present

## 2021-05-22 DIAGNOSIS — R911 Solitary pulmonary nodule: Secondary | ICD-10-CM | POA: Diagnosis not present

## 2021-05-22 DIAGNOSIS — Z0000 Encounter for general adult medical examination without abnormal findings: Secondary | ICD-10-CM | POA: Diagnosis not present

## 2021-05-22 DIAGNOSIS — R7301 Impaired fasting glucose: Secondary | ICD-10-CM | POA: Diagnosis not present

## 2021-05-22 DIAGNOSIS — I110 Hypertensive heart disease with heart failure: Secondary | ICD-10-CM | POA: Diagnosis not present

## 2021-05-30 ENCOUNTER — Telehealth (HOSPITAL_COMMUNITY): Payer: Self-pay

## 2021-05-30 NOTE — Telephone Encounter (Signed)
Pt called and stated he is interested in CR. Patient will come in for orientation on 07/02/21 @ 930AM and will attend the 1:15PM exercise class.   Tourist information centre manager.

## 2021-06-13 DIAGNOSIS — R82998 Other abnormal findings in urine: Secondary | ICD-10-CM | POA: Diagnosis not present

## 2021-06-24 ENCOUNTER — Emergency Department (HOSPITAL_BASED_OUTPATIENT_CLINIC_OR_DEPARTMENT_OTHER): Payer: Medicare Other | Admitting: Radiology

## 2021-06-24 ENCOUNTER — Encounter (HOSPITAL_BASED_OUTPATIENT_CLINIC_OR_DEPARTMENT_OTHER): Payer: Self-pay

## 2021-06-24 ENCOUNTER — Other Ambulatory Visit: Payer: Self-pay

## 2021-06-24 ENCOUNTER — Emergency Department (HOSPITAL_BASED_OUTPATIENT_CLINIC_OR_DEPARTMENT_OTHER)
Admission: EM | Admit: 2021-06-24 | Discharge: 2021-06-24 | Disposition: A | Discharge status: Home / Self Care | Payer: Medicare Other | Attending: Emergency Medicine | Admitting: Emergency Medicine

## 2021-06-24 DIAGNOSIS — Z20822 Contact with and (suspected) exposure to covid-19: Secondary | ICD-10-CM | POA: Insufficient documentation

## 2021-06-24 DIAGNOSIS — Z8546 Personal history of malignant neoplasm of prostate: Secondary | ICD-10-CM | POA: Insufficient documentation

## 2021-06-24 DIAGNOSIS — R079 Chest pain, unspecified: Secondary | ICD-10-CM | POA: Diagnosis not present

## 2021-06-24 DIAGNOSIS — Z951 Presence of aortocoronary bypass graft: Secondary | ICD-10-CM | POA: Diagnosis not present

## 2021-06-24 DIAGNOSIS — Z87891 Personal history of nicotine dependence: Secondary | ICD-10-CM | POA: Diagnosis not present

## 2021-06-24 DIAGNOSIS — Z7982 Long term (current) use of aspirin: Secondary | ICD-10-CM | POA: Diagnosis not present

## 2021-06-24 DIAGNOSIS — I517 Cardiomegaly: Secondary | ICD-10-CM | POA: Diagnosis not present

## 2021-06-24 DIAGNOSIS — I2510 Atherosclerotic heart disease of native coronary artery without angina pectoris: Secondary | ICD-10-CM | POA: Insufficient documentation

## 2021-06-24 DIAGNOSIS — Z79899 Other long term (current) drug therapy: Secondary | ICD-10-CM | POA: Insufficient documentation

## 2021-06-24 DIAGNOSIS — I5033 Acute on chronic diastolic (congestive) heart failure: Secondary | ICD-10-CM | POA: Insufficient documentation

## 2021-06-24 DIAGNOSIS — R001 Bradycardia, unspecified: Secondary | ICD-10-CM | POA: Diagnosis not present

## 2021-06-24 DIAGNOSIS — Z7902 Long term (current) use of antithrombotics/antiplatelets: Secondary | ICD-10-CM | POA: Insufficient documentation

## 2021-06-24 DIAGNOSIS — R008 Other abnormalities of heart beat: Secondary | ICD-10-CM | POA: Diagnosis not present

## 2021-06-24 DIAGNOSIS — I110 Hypertensive heart disease with heart failure: Secondary | ICD-10-CM | POA: Insufficient documentation

## 2021-06-24 DIAGNOSIS — I498 Other specified cardiac arrhythmias: Secondary | ICD-10-CM

## 2021-06-24 LAB — CBC WITH DIFFERENTIAL/PLATELET
Abs Immature Granulocytes: 0.02 10*3/uL (ref 0.00–0.07)
Basophils Absolute: 0 10*3/uL (ref 0.0–0.1)
Basophils Relative: 1 %
Eosinophils Absolute: 0.2 10*3/uL (ref 0.0–0.5)
Eosinophils Relative: 3 %
HCT: 41.8 % (ref 39.0–52.0)
Hemoglobin: 14 g/dL (ref 13.0–17.0)
Immature Granulocytes: 0 %
Lymphocytes Relative: 25 %
Lymphs Abs: 1.6 10*3/uL (ref 0.7–4.0)
MCH: 32.1 pg (ref 26.0–34.0)
MCHC: 33.5 g/dL (ref 30.0–36.0)
MCV: 95.9 fL (ref 80.0–100.0)
Monocytes Absolute: 0.6 10*3/uL (ref 0.1–1.0)
Monocytes Relative: 10 %
Neutro Abs: 3.8 10*3/uL (ref 1.7–7.7)
Neutrophils Relative %: 61 %
Platelets: 140 10*3/uL — ABNORMAL LOW (ref 150–400)
RBC: 4.36 MIL/uL (ref 4.22–5.81)
RDW: 13 % (ref 11.5–15.5)
WBC: 6.3 10*3/uL (ref 4.0–10.5)
nRBC: 0 % (ref 0.0–0.2)

## 2021-06-24 LAB — BASIC METABOLIC PANEL
Anion gap: 8 (ref 5–15)
BUN: 28 mg/dL — ABNORMAL HIGH (ref 8–23)
CO2: 27 mmol/L (ref 22–32)
Calcium: 9.4 mg/dL (ref 8.9–10.3)
Chloride: 100 mmol/L (ref 98–111)
Creatinine, Ser: 1.03 mg/dL (ref 0.61–1.24)
GFR, Estimated: >60 mL/min (ref >60)
Glucose, Bld: 95 mg/dL (ref 70–99)
Potassium: 4.1 mmol/L (ref 3.5–5.1)
Sodium: 135 mmol/L (ref 135–145)

## 2021-06-24 LAB — RESP PANEL BY RT-PCR (FLU A&B, COVID) ARPGX2
Influenza A by PCR: NEGATIVE
Influenza B by PCR: NEGATIVE
SARS Coronavirus 2 by RT PCR: NEGATIVE

## 2021-06-24 LAB — MAGNESIUM: Magnesium: 2.3 mg/dL (ref 1.7–2.4)

## 2021-06-24 LAB — TROPONIN I (HIGH SENSITIVITY)
Troponin I (High Sensitivity): 85 ng/L — ABNORMAL HIGH (ref <18)
Troponin I (High Sensitivity): 77 ng/L — ABNORMAL HIGH (ref <18)

## 2021-06-24 MED ORDER — METOPROLOL TARTRATE 25 MG PO TABS: 25.0000 mg | ORAL_TABLET | Freq: Two times a day (BID) | ORAL | 0 refills | Status: DC

## 2021-06-24 MED ORDER — METOPROLOL TARTRATE 25 MG PO TABS
25.0000 mg | ORAL_TABLET | Freq: Once | ORAL | Status: AC
  Administered 2021-06-24: 25 mg via ORAL
  Filled 2021-06-24: qty 1

## 2021-06-24 NOTE — ED Provider Notes (Signed)
[facility] EMERGENCY DEPT Provider Note   CSN: [id number] Arrival date & time: 06/24/21  0242     History Chief Complaint  Patient presents with   Bradycardia    Andre Miranda is a 85 y.o. male.  HPI     This is an 85 year old male with a history of coronary artery disease, reflux, hypertension, hyperlipidemia who presents with concerns for bradycardia.  Patient reports that he went to bed later this evening.  He felt like his heart was slow.  He took his pulse rate and it was 30.  This was confirmed with pulse ox.  He is not having any active chest pain or shortness of breath.  However, he states he recently had a stent placed.  Since that time he has had intermittent episodes of chest discomfort.  He states normally when he has an intervention, he "returns back to baseline."  However, this time he does not feel like he has.  He denies any recent illnesses.  Denies lower extremity swelling or fevers.  He has had occasional lightheadedness but no room spinning dizziness.  Past Medical History:  Diagnosis Date   Aortic stenosis    MODERATE   Cancer ([place])    prostate cancer-radiation   Carotid arterial disease ([place])    Coronary artery disease    Dysuria    GERD (gastroesophageal reflux disease)    Gout    Hematuria    History of urinary retention    Hyperlipidemia    Hypertension    Obesity    Sleep apnea    uses CPAP nightly    Patient Active Problem List   Diagnosis Date Noted   Angina pectoris ([place]) 04/03/2021   CAD (coronary artery disease) of bypass graft 04/03/2021   Elevated troponin    Acute on chronic diastolic heart failure ([place])    Dyspnea 09/24/2019   Acute CHF (congestive heart failure) ([place]) 09/23/2019   Near syncope 09/11/2019   Pre-syncope 09/10/2019   Congestive heart failure (H[facility])    Hx of CABG    History of right-sided carotid endarterectomy    Personal history of prostate cancer    AKI (acute kidney injury) ([place])     Gastroesophageal reflux disease    Insomnia    Acute on chronic diastolic CHF (congestive heart failure) ([place]) 04/22/2019   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)     Past Surgical History:  Procedure Laterality Date   AORTIC VALVE REPAIR  February 06, 2015   Dr. Aline Brochure and Dr. Ysidro Evert  at [place]  09/26/2019   Procedure: BIOPSY;  Surgeon: Otis Brace, MD;  Location: [facility];  Service: Gastroenterology;;   CAROTID ENDARTERECTOMY  02/2008   CHOLECYSTECTOMY  04/2002   COLONOSCOPY WITH PROPOFOL Left 09/26/2019   Procedure: COLONOSCOPY WITH PROPOFOL;  Surgeon: Otis Brace, MD;  Location: [facility];  Service: Gastroenterology;  Laterality: Left;   CORONARY ARTERY BYPASS GRAFT  2003   LIMA TO THE LAD,  RIMA TO THE RCA, AND A SAPHENOUS VEIN GRAFT TO THE INTERMEDIATE AND DISTAL LEFT CIRCUMFLEX   CORONARY STENT INTERVENTION N/A 04/03/2021   Procedure: CORONARY STENT INTERVENTION;  Surgeon: Belva Crome, MD;  Location: [facility] CV LAB;  Service: Cardiovascular;  Laterality: N/A;   CORONARY/GRAFT ANGIOGRAPHY N/A 09/27/2019   Procedure: CORONARY/GRAFT ANGIOGRAPHY;  Surgeon: Belva Crome, MD;  Location: [facility] CV LAB;  Service: Cardiovascular;  Laterality: N/A;   CORONARY/GRAFT ANGIOGRAPHY N/A 04/03/2021   Procedure: CORONARY/GRAFT ANGIOGRAPHY;  Surgeon: Belva Crome, MD;  Location: [facility] CV LAB;  Service: Cardiovascular;  Laterality: N/A;   ESOPHAGOGASTRODUODENOSCOPY (EGD)  WITH PROPOFOL Left 09/26/2019   Procedure: ESOPHAGOGASTRODUODENOSCOPY (EGD) WITH PROPOFOL;  Surgeon: Otis Brace, MD;  Location: [facility];  Service: Gastroenterology;  Laterality: Left;   ORIF ANKLE FRACTURE Right 02/14/2019   Procedure: OPEN REDUCTION INTERNAL FIXATION (ORIF) ANKLE FRACTURE;  Surgeon: Netta Cedars, MD;  Location: [facility];  Service: Orthopedics;  Laterality: Right;   POLYPECTOMY  09/26/2019   Procedure: POLYPECTOMY;  Surgeon: Otis Brace, MD;  Location: [facility];  Service: Gastroenterology;;   US ECHOCARDIOGRAPHY  08/2009   SHOWED A MEAN GRADIENT ACROSS IS AORTIC VALVE OF 24 MM OF MERCURY. HE HAD MODERATE LVH AND NORMAL LV FUNCTION       Family History  Problem Relation Age of Onset   Heart attack Father    Pneumonia Mother 33    Social History   Tobacco Use   Smoking status: Former    Packs/day: 1.50    Years: 7.00    Pack years: 10.50    Types: Cigarettes    Quit date: 04/03/1971    Years since quitting: 50.2   Smokeless tobacco: Never  Vaping Use   Vaping Use: Never used  Substance Use Topics   Alcohol use: Yes    Comment: social   Drug use: No    Home Medications Prior to Admission medications   Medication Sig Start Date End Date Taking? Authorizing Provider  acetaminophen (TYLENOL) 500 MG tablet Take 500 mg by mouth every 6 (six) hours as needed for moderate pain.   Yes [provider]  allopurinol (ZYLOPRIM) 300 MG tablet Take 300 mg by mouth daily.   Yes [provider]  amLODipine (NORVASC) 5 MG tablet Take 1 tablet by mouth once daily 10/08/20  Yes Burtis Junes, NP  aspirin EC 81 MG tablet Take 81 mg by mouth at bedtime.  11/23/12  Yes Larey Dresser, MD  clopidogrel (PLAVIX) 75 MG tablet Take 1 tablet (75 mg total) by mouth daily with breakfast. 04/05/21  Yes Almyra Deforest, PA  famotidine (PEPCID) 20 MG tablet Take 20 mg by mouth daily before breakfast.   Yes [provider]   furosemide (LASIX) 40 MG tablet Take 1 tablet (40 mg total) by mouth daily. 11/26/20  Yes Belva Crome, MD  isosorbide mononitrate (IMDUR) 60 MG 24 hr tablet Take 1 tablet (60 mg total) by mouth daily. 04/01/21  Yes Belva Crome, MD  metoprolol tartrate (LOPRESSOR) 25 MG tablet Take 1 tablet (25 mg total) by mouth 2 (two) times daily. 06/24/21  Yes Kylil Swopes, Barbette Hair, MD  PRESCRIPTION MEDICATION CPAP: At bedtime   Yes [provider]  ramipril (ALTACE) 5 MG capsule Take 1 capsule (5 mg total) by mouth at bedtime. 04/01/21  Yes Belva Crome, MD  rosuvastatin (CRESTOR) 10 MG tablet Take 10 mg by mouth every evening.   Yes [provider]  spironolactone (ALDACTONE) 25 MG tablet Take 1 tablet (25 mg total) by mouth daily. 07/11/20  Yes Belva Crome, MD  Tamsulosin HCl (FLOMAX) 0.4 MG CAPS Take 0.4 mg by mouth every evening.   Yes [provider]  azithromycin (ZITHROMAX) 500 MG tablet Take 500 mg by mouth See admin instructions. Take 500 mg 1 hour prior to dental work    [provider]  Cholecalciferol (VITAMIN D3) 1000 UNITS CAPS Take 1,000 Units by mouth at bedtime.    [provider]  miconazole (ZEASORB-AF) 2 % powder Apply 1 application topically as needed for itching.    [provider]  Multiple Vitamins-Minerals (CENTRUM SILVER) tablet Take 1 tablet by mouth daily with breakfast.    [provider]  nitroGLYCERIN (NITROSTAT) 0.4 MG SL tablet Place 1 tablet (0.4 mg total) under the tongue every 5 (five) minutes as needed for chest pain. 02/28/21 05/29/21  Belva Crome, MD  polyethylene glycol powder [facility]) 17 GM/SCOOP powder Take 17 g by mouth daily as needed for moderate constipation. 10/15/19   [provider]  temazepam (RESTORIL) 15 MG capsule Take 15-30 mg by mouth at bedtime as needed for sleep. 03/20/21   [provider]    Allergies    Ibuprofen  Review of Systems   Review of Systems   Constitutional:  Negative for fever.  Respiratory:  Positive for shortness of breath.   Cardiovascular:  Negative for chest pain, palpitations and leg swelling.  Gastrointestinal:  Negative for abdominal pain, nausea and vomiting.  Neurological:  Positive for light-headedness. Negative for dizziness.  All other systems reviewed and are negative.  Physical Exam Updated Vital Signs BP (!) 143/47   Pulse (!) 33   Temp 97.6 F (36.4 C)   Resp 19   Ht 1.753 m ('5\' 9"'$ )   Wt 100.7 kg   SpO2 98%   BMI 32.78 kg/m   Physical Exam Vitals and nursing note reviewed.  Constitutional:      Appearance: He is well-developed. He is not ill-appearing.  HENT:     Head: Normocephalic and atraumatic.     Nose: Nose normal.     Mouth/Throat:     Mouth: Mucous membranes are moist.  Eyes:     Pupils: Pupils are equal, round, and reactive to light.  Cardiovascular:     Rate and Rhythm: Regular rhythm. Bradycardia present.     Heart sounds: Murmur heard.  Pulmonary:     Effort: Pulmonary effort is normal. No respiratory distress.     Breath sounds: Normal breath sounds. No wheezing.  Abdominal:     General: Bowel sounds are normal.     Palpations: Abdomen is soft.     Tenderness: There is no abdominal tenderness. There is no rebound.  Musculoskeletal:     Cervical back: Neck supple.     Right lower leg: No edema.     Left lower leg: No edema.  Lymphadenopathy:     Cervical: No cervical adenopathy.  Skin:    General: Skin is warm and dry.  Neurological:     Mental Status: He is alert and oriented to person, place, and time.  Psychiatric:        Mood and Affect: Mood normal.    ED Results / Procedures / Treatments   Labs (all labs ordered are listed, but only abnormal results are displayed) Labs Reviewed  CBC WITH DIFFERENTIAL/PLATELET - Abnormal; Notable for the following components:  Result Value   Platelets 140 (*)    All other components within normal limits  BASIC  METABOLIC PANEL - Abnormal; Notable for the following components:   BUN 28 (*)    All other components within normal limits  TROPONIN I (HIGH SENSITIVITY) - Abnormal; Notable for the following components:   Troponin I (High Sensitivity) 85 (*)    All other components within normal limits  TROPONIN I (HIGH SENSITIVITY) - Abnormal; Notable for the following components:   Troponin I (High Sensitivity) 77 (*)    All other components within normal limits  RESP PANEL BY RT-PCR (FLU A&B, COVID) [id number]  MAGNESIUM    EKG EKG Interpretation  Date/Time:  Monday June 24 2021 02:51:56 EDT Ventricular Rate:  58 PR Interval:  224 QRS Duration: 148 QT Interval:  458 QTC Calculation: 449 R Axis:   75 Text Interpretation: Sinus bradycardia with 1st degree A-V block with frequent Premature ventricular complexes in a pattern of bigeminy Right bundle branch block Abnormal ECG bigeminy when compared to prior Confirmed by Thayer Jew [phone]) on 06/24/2021 2:54:37 AM  Radiology DG Chest 2 View  Result Date: 06/24/2021 CLINICAL DATA:  85 year old male with chest pain and bradycardia. EXAM: CHEST - 2 VIEW COMPARISON:  Portable chest 09/23/2019 and earlier. FINDINGS: Cardiac apex region implanted recorder or defibrillator. Prior CABG and TAVR. Borderline cardiomegaly. Other mediastinal contours are within normal limits. Visualized tracheal air column is within normal limits. Lung volumes and ventilation appear normal today. No pneumothorax, pulmonary edema, pleural effusion or confluent pulmonary opacity. No acute osseous abnormality identified. Negative visible bowel gas pattern. IMPRESSION: No acute cardiopulmonary abnormality. Electronically Signed   By: Genevie Ann M.D.   On: 06/24/2021 04:21    Procedures Procedures   Medications Ordered in ED Medications  metoprolol tartrate (LOPRESSOR) tablet 25 mg (has no administration in time range)    ED Course  I have reviewed the triage vital signs and the  nursing notes.  Pertinent labs & imaging results that were available during my care of the patient were reviewed by me and considered in my medical decision making (see chart for details).  Clinical Course as of 06/24/21 0653  Mon Jun 24, 2021  0647 Spoke with Dr. Gasper Sells who has reviewed the patient's chart with me.  Likely symptomatic PVCs.  Recommends low-dose metoprolol.  Patient is not having any active chest pain at this moment and his troponin is slightly elevated but flat.  He has similar troponins in the past when he was not having active ischemia.  Recommends close follow-up with Dr. Tamala Julian.  Patient and wife are agreeable to this plan.  He remains asymptomatic. [CH]    Clinical Course User Index [CH] Ahmon Tosi, Barbette Hair, MD   MDM Rules/Calculators/A&P                           Patient presents with concerns for bradycardia.  Noted his heart rate to be in the 30s.  He is nontoxic-appearing and vital signs are otherwise reassuring.  Pulse rate was noted to be in the 30s; however, ventricular rate is in the 60s.  He appears to be in bigeminy.  He is largely asymptomatic.  He does report some episodes of chest discomfort and lightheadedness although this has been going on since his stent placement in May.  Currently he is without symptoms.  There is no evidence of ischemia on his EKG.  Pacemaker interrogation confirms bigeminy.  Chest x-ray is without pneumothorax or pneumonia.  Labs obtained.  No metabolic derangements and magnesium is normal.  Troponin is elevated but stably elevated with initial troponin of 87 and repeat at 77.  He has similar troponins in the past and he is not having active chest pain.  Do not feel his presentation today is consistent with ACS.  No specific etiology for his bigeminy.  See discussion with cardiology above.  He remained asymptomatic in the emergency room.  Patient and wife are agreeable to plan.  Will initiate 25 mg metoprolol twice daily and have him  follow-up closely with cardiology.  After history, exam, and medical workup I feel the patient has been appropriately medically screened and is safe for discharge home. Pertinent diagnoses were discussed with the patient. Patient was given return precautions.  Final Clinical Impression(s) / ED Diagnoses Final diagnoses:  Ventricular bigeminy    Rx / DC Orders ED Discharge Orders          Ordered    metoprolol tartrate (LOPRESSOR) 25 MG tablet  2 times daily        06/24/21 0651             Tanya Marvin, Barbette Hair, MD 06/24/21 [phone]

## 2021-06-24 NOTE — ED Triage Notes (Addendum)
Pt is present for a low heart rate. He noticed it when he went to bed at 0100 and just "felt his heart rate being slow", he then measured it and it was 30-32 bpm. Denies being dizzy or lightheaded, no chest pain. Pt has a pacemaker and is able to record when he feels something abnormal but results did not transmit. AAOx4.

## 2021-06-24 NOTE — Discharge Instructions (Addendum)
You were seen today for slow heart rate.  You are actually having very frequent PVCs in a pattern known as bigeminy.  This is making your pulse rate slow.  You will be started on a beta-blocker.  Follow-up closely with Dr. Thompson Caul office.  If you have new any worsening symptoms, you should be reevaluated.

## 2021-06-27 ENCOUNTER — Telehealth (HOSPITAL_COMMUNITY): Payer: Self-pay | Admitting: *Deleted

## 2021-06-27 NOTE — Telephone Encounter (Signed)
Andre Miranda returned my call from yesterday. He states that his wife had an appointment with Dr. Tamala Julian 06/26/21. Dr. Tamala Julian sent a message through his wife that it is ok that he can come to his CR orientation appointment 07/02/21 and attend exercise classes.

## 2021-06-27 NOTE — Telephone Encounter (Signed)
Called pt 06/26/21. We have Andre Miranda scheduled for CR orientation 07/02/21. In reviewing his records found that he had been to the ED 06/24/21. Called him to get update on what had happened since then. I was not able to reach him but left a message for him to return call.

## 2021-06-28 DIAGNOSIS — N35011 Post-traumatic bulbous urethral stricture: Secondary | ICD-10-CM | POA: Diagnosis not present

## 2021-06-28 DIAGNOSIS — C61 Malignant neoplasm of prostate: Secondary | ICD-10-CM | POA: Diagnosis not present

## 2021-06-28 DIAGNOSIS — R338 Other retention of urine: Secondary | ICD-10-CM | POA: Diagnosis not present

## 2021-07-01 ENCOUNTER — Telehealth (HOSPITAL_COMMUNITY): Payer: Self-pay | Admitting: *Deleted

## 2021-07-01 NOTE — Telephone Encounter (Signed)
Left message to call cardiac rehab.Barnet Pall, RN,BSN 07/01/2021 12:07 PM

## 2021-07-01 NOTE — Telephone Encounter (Signed)
Spoke with Mr Saulter confirmed appointment for 06/25/21 completed health history.Barnet Pall, RN,BSN 07/01/2021 1:21 PM

## 2021-07-02 ENCOUNTER — Encounter (HOSPITAL_COMMUNITY): Payer: Self-pay

## 2021-07-02 ENCOUNTER — Encounter (HOSPITAL_COMMUNITY)
Admission: RE | Admit: 2021-07-02 | Discharge: 2021-07-02 | Disposition: A | Discharge status: Home / Self Care | Payer: Medicare Other | Source: Ambulatory Visit | Attending: Interventional Cardiology | Admitting: Interventional Cardiology

## 2021-07-02 ENCOUNTER — Other Ambulatory Visit: Payer: Self-pay

## 2021-07-02 ENCOUNTER — Telehealth: Payer: Self-pay | Admitting: Physician Assistant

## 2021-07-02 VITALS — BP 120/68 | HR 58 | Ht 68.75 in | Wt 228.0 lb

## 2021-07-02 DIAGNOSIS — Z48812 Encounter for surgical aftercare following surgery on the circulatory system: Secondary | ICD-10-CM | POA: Diagnosis not present

## 2021-07-02 DIAGNOSIS — R079 Chest pain, unspecified: Secondary | ICD-10-CM | POA: Insufficient documentation

## 2021-07-02 DIAGNOSIS — Z955 Presence of coronary angioplasty implant and graft: Secondary | ICD-10-CM | POA: Diagnosis not present

## 2021-07-02 MED ORDER — ISOSORBIDE MONONITRATE ER 60 MG PO TB24: 90.0000 mg | ORAL_TABLET | Freq: Every day | ORAL | 11 refills | Status: DC

## 2021-07-02 NOTE — Telephone Encounter (Signed)
Spoke with Andre Miranda and scheduled him to see Dr. Tamala Julian in Sept.  Andre Miranda appreciative for call.

## 2021-07-02 NOTE — Telephone Encounter (Signed)
Paged from cardiac rehab.  Patient had chest pain while 6-minute walk test.  Symptoms occurred at end of walking.  Resolved with rest within 1 to 2 minutes.  EKG without acute ischemic changes.  I have spoke with [name].  He reports exertional chest tightness 3-4 times per week.  Each episode resolved with rest.  He tries to limit his activity due to pain.  He never required to take sublingual nitroglycerin.  This has been improved since his PCI.  Previously, he was having resting chest tightness.  I have reviewed his case with Dr. Tamala Julian today who thinks his angina could be coming from circumflex territory but no active issue that warrants cardiac catheterization.  Recommended to continue cardiac rehab which helps to improve exercise tolerance.  Increase Imdur to 90 mg daily.  Patient will need follow-up in clinic in 1 month.

## 2021-07-02 NOTE — Progress Notes (Signed)
Patient experienced 2/10 centralized chest discomfort towards the end of his 6 minute walk test. Telemetry rhythm V paced rate 115. Blood pressure 138/78. Oxygen saturation 98% on room air. The chest pain resolved after 2 minute of rest. Will obtain 12 lead ECG. 12 lead ECG obtained. Vin Bhagat PAC paged and notified. Vin reviewed 12 lead ECG and talked to Andre Andre Miranda over the phone about his chest discomfort. Vin will discuss with Dr Tamala Julian. Medications reviewed. Vin called back and talked with Andre Miranda via speaker phone. Dr Tamala Julian said that Andre Andre Miranda may proceed  with exercise at  phase 2 cardiac rehab. Vin said that Andre Andre Miranda Imdur will be increased to 90 mg once a day. New dose written down for the patient. Vin is calling the new prescription into pharmacy for the patient. Andre Andre Miranda had no symptoms or complaints upon exit from phase 2 cardiac rehab. Will fax today's ECG tracings to [facility] to review ECG tracings. Exit heart rate 54. Blood pressure 122/70. Oxygen saturation 98%.Barnet Pall, RN,BSN 07/02/2021 11:12 AM

## 2021-07-02 NOTE — Progress Notes (Signed)
Cardiac Individual Treatment Plan  Patient Details  Name: Andre Miranda MRN: [id number] Date of Birth: Oct 19, 1936 Referring Provider:   Flowsheet Row CARDIAC REHAB PHASE II ORIENTATION from 07/02/2021 in [place]  Referring Provider Daneen Schick, MD       Initial Encounter Date:  [place] PHASE II ORIENTATION from 07/02/2021 in [place]  Date 07/02/21       Visit Diagnosis: 04/03/21 S/P DES SVG   Patient's Home Medications on Admission:  Current Outpatient Medications:    acetaminophen (TYLENOL) 500 MG tablet, Take 500 mg by mouth every 6 (six) hours as needed for moderate pain., Disp: , Rfl:    allopurinol (ZYLOPRIM) 300 MG tablet, Take 300 mg by mouth daily., Disp: , Rfl:    amLODipine (NORVASC) 5 MG tablet, Take 1 tablet by mouth once daily, Disp: 90 tablet, Rfl: 3   aspirin EC 81 MG tablet, Take 81 mg by mouth at bedtime. , Disp: , Rfl:    azithromycin (ZITHROMAX) 500 MG tablet, Take 500 mg by mouth See admin instructions. Take 500 mg 1 hour prior to dental work, Disp: , Rfl:    Cholecalciferol (VITAMIN D3) 1000 UNITS CAPS, Take 1,000 Units by mouth at bedtime., Disp: , Rfl:    clopidogrel (PLAVIX) 75 MG tablet, Take 1 tablet (75 mg total) by mouth daily with breakfast., Disp: 90 tablet, Rfl: 3   famotidine (PEPCID) 20 MG tablet, Take 20 mg by mouth daily before breakfast., Disp: , Rfl:    furosemide (LASIX) 40 MG tablet, Take 1 tablet (40 mg total) by mouth daily., Disp: 90 tablet, Rfl: 2   metoprolol tartrate (LOPRESSOR) 25 MG tablet, Take 1 tablet (25 mg total) by mouth 2 (two) times daily., Disp: 60 tablet, Rfl: 0   miconazole (ZEASORB-AF) 2 % powder, Apply 1 application topically as needed for itching., Disp: , Rfl:    Multiple Vitamins-Minerals (CENTRUM SILVER) tablet, Take 1 tablet by mouth daily with breakfast., Disp: , Rfl:    nitroGLYCERIN (NITROSTAT) 0.4 MG SL tablet, Place 1  tablet (0.4 mg total) under the tongue every 5 (five) minutes as needed for chest pain., Disp: 25 tablet, Rfl: 3   polyethylene glycol powder (GLYCOLAX/MIRALAX) 17 GM/SCOOP powder, Take 17 g by mouth daily as needed for moderate constipation., Disp: , Rfl:    PRESCRIPTION MEDICATION, CPAP: At bedtime, Disp: , Rfl:    ramipril (ALTACE) 5 MG capsule, Take 1 capsule (5 mg total) by mouth at bedtime., Disp: 90 capsule, Rfl: 3   rosuvastatin (CRESTOR) 10 MG tablet, Take 10 mg by mouth every evening., Disp: , Rfl:    spironolactone (ALDACTONE) 25 MG tablet, Take 1 tablet (25 mg total) by mouth daily., Disp: 90 tablet, Rfl: 3   Tamsulosin HCl (FLOMAX) 0.4 MG CAPS, Take 0.4 mg by mouth every evening., Disp: , Rfl:    temazepam (RESTORIL) 15 MG capsule, Take 15-30 mg by mouth at bedtime as needed for sleep., Disp: , Rfl:    isosorbide mononitrate (IMDUR) 60 MG 24 hr tablet, Take 1.5 tablets (90 mg total) by mouth daily., Disp: 45 tablet, Rfl: 11  Past Medical History: Past Medical History:  Diagnosis Date   Aortic stenosis    MODERATE   Cancer ([place])    prostate cancer-radiation   Carotid arterial disease ([facility])    Coronary artery disease    Dysuria    GERD (gastroesophageal reflux disease)    Gout  Hematuria    History of urinary retention    Hyperlipidemia    Hypertension    Obesity    Sleep apnea    uses CPAP nightly    Tobacco Use: Social History   Tobacco Use  Smoking Status Former   Packs/day: 1.50   Years: 7.00   Pack years: 10.50   Types: Cigarettes   Quit date: 04/03/1971   Years since quitting: 50.2  Smokeless Tobacco Never    Labs: Recent Review Flowsheet Data     Labs for ITP Cardiac and Pulmonary Rehab Latest Ref Rng & Units 11/29/2014 04/22/2019 04/23/2019   Cholestrol 0 - 200 mg/dL 109 - 90   LDLCALC 0 - 99 mg/dL 46 - 42   HDL >40 mg/dL 42.40 - 32(L)   Trlycerides <150 mg/dL 104.0 - 79   Hemoglobin A1c 4.8 - 5.6 % - 5.4 -       Capillary Blood Glucose: Lab  Results  Component Value Date   GLUCAP 134 (H) 09/10/2019     Exercise Target Goals: Exercise Program Goal: Individual exercise prescription set using results from initial 6 min walk test and THRR while considering  patient's activity barriers and safety.   Exercise Prescription Goal: Starting with aerobic activity 30 plus minutes a day, 3 days per week for initial exercise prescription. Provide home exercise prescription and guidelines that participant acknowledges understanding prior to discharge.  Activity Barriers & Risk Stratification:  Activity Barriers & Cardiac Risk Stratification - 07/02/21 1045       Activity Barriers & Cardiac Risk Stratification   Activity Barriers Chest Pain/Angina;Balance Concerns;Arthritis    Cardiac Risk Stratification High             6 Minute Walk:  6 Minute Walk     Row Name 07/02/21 0953         6 Minute Walk   Phase Initial     Distance 1314 feet     Walk Time 6 minutes     # of Rest Breaks 0     MPH 2.49     METS 2.07     RPE 13     Perceived Dyspnea  0     VO2 Peak 7.26     Symptoms Yes (comment)     Comments Chest pain (central chest/sternum) 2/10 resolved within 2 minutes. No other symptoms     Resting HR 54 bpm     Resting BP 120/68     Resting Oxygen Saturation  98 %     Exercise Oxygen Saturation  during 6 min walk 98 %     Max Ex. HR 111 bpm     Max Ex. BP 138/70     2 Minute Post BP 122/70              Oxygen Initial Assessment:   Oxygen Re-Evaluation:   Oxygen Discharge (Final Oxygen Re-Evaluation):   Initial Exercise Prescription:  Initial Exercise Prescription - 07/02/21 1000       Date of Initial Exercise RX and Referring Provider   Date 07/02/21    Referring Provider Daneen Schick, MD    Expected Discharge Date 08/30/21      NuStep   Level 2    SPM 75    Minutes 30    METs 2      Prescription Details   Frequency (times per week) 3    Duration Progress to 30 minutes of continuous  aerobic without signs/symptoms of physical distress  Intensity   THRR 40-80% of Max Heartrate 55-110    Ratings of Perceived Exertion 11-13    Perceived Dyspnea 0-4      Progression   Progression Continue progressive overload as per policy without signs/symptoms or physical distress.      Resistance Training   Training Prescription Yes    Weight 3 lbs    Reps 10-15             Perform Capillary Blood Glucose checks as needed.  Exercise Prescription Changes:   Exercise Comments:   Exercise Goals and Review:   Exercise Goals     Row Name 07/02/21 1046             Exercise Goals   Increase Physical Activity Yes       Intervention Provide advice, education, support and counseling about physical activity/exercise needs.;Develop an individualized exercise prescription for aerobic and resistive training based on initial evaluation findings, risk stratification, comorbidities and participant's personal goals.       Expected Outcomes Short Term: Attend rehab on a regular basis to increase amount of physical activity.;Long Term: Add in home exercise to make exercise part of routine and to increase amount of physical activity.;Long Term: Exercising regularly at least 3-5 days a week.       Increase Strength and Stamina Yes       Intervention Provide advice, education, support and counseling about physical activity/exercise needs.;Develop an individualized exercise prescription for aerobic and resistive training based on initial evaluation findings, risk stratification, comorbidities and participant's personal goals.       Expected Outcomes Short Term: Increase workloads from initial exercise prescription for resistance, speed, and METs.;Short Term: Perform resistance training exercises routinely during rehab and add in resistance training at home;Long Term: Improve cardiorespiratory fitness, muscular endurance and strength as measured by increased METs and functional capacity  (6MWT)       Able to understand and use rate of perceived exertion (RPE) scale Yes       Intervention Provide education and explanation on how to use RPE scale       Expected Outcomes Short Term: Able to use RPE daily in rehab to express subjective intensity level;Long Term:  Able to use RPE to guide intensity level when exercising independently       Knowledge and understanding of Target Heart Rate Range (THRR) Yes       Intervention Provide education and explanation of THRR including how the numbers were predicted and where they are located for reference       Expected Outcomes Short Term: Able to state/look up THRR;Long Term: Able to use THRR to govern intensity when exercising independently;Short Term: Able to use daily as guideline for intensity in rehab       Understanding of Exercise Prescription Yes       Intervention Provide education, explanation, and written materials on patient's individual exercise prescription       Expected Outcomes Short Term: Able to explain program exercise prescription;Long Term: Able to explain home exercise prescription to exercise independently                Exercise Goals Re-Evaluation :    Discharge Exercise Prescription (Final Exercise Prescription Changes):   Nutrition:  Target Goals: Understanding of nutrition guidelines, daily intake of sodium '1500mg'$ , cholesterol '200mg'$ , calories 30% from fat and 7% or less from saturated fats, daily to have 5 or more servings of fruits and vegetables.  Biometrics:  Pre Biometrics - 07/02/21  0920       Pre Biometrics   Waist Circumference 46.25 inches    Hip Circumference 45.5 inches    Waist to Hip Ratio 1.02 %    Triceps Skinfold 21 mm    % Body Fat 34.6 %    Grip Strength 35 kg    Flexibility 12 in    Single Leg Stand 4.68 seconds              Nutrition Therapy Plan and Nutrition Goals:   Nutrition Assessments:  MEDIFICTS Score Key: ?70 Need to make dietary changes  40-70 Heart  Healthy Diet ? 40 Therapeutic Level Cholesterol Diet   Picture Your Plate Scores: D34-534 Unhealthy dietary pattern with much room for improvement. 41-50 Dietary pattern unlikely to meet recommendations for good health and room for improvement. 51-60 More healthful dietary pattern, with some room for improvement.  >60 Healthy dietary pattern, although there may be some specific behaviors that could be improved.    Nutrition Goals Re-Evaluation:   Nutrition Goals Discharge (Final Nutrition Goals Re-Evaluation):   Psychosocial: Target Goals: Acknowledge presence or absence of significant depression and/or stress, maximize coping skills, provide positive support system. Participant is able to verbalize types and ability to use techniques and skills needed for reducing stress and depression.  Initial Review & Psychosocial Screening:  Initial Psych Review & Screening - 07/02/21 1042       Initial Review   Current issues with None Identified      Family Dynamics   Good Support System? Yes   Rush Landmark has his wife children and grandchildren for support.     Barriers   Psychosocial barriers to participate in program The patient should benefit from training in stress management and relaxation.      Screening Interventions   Interventions Encouraged to exercise             Quality of Life Scores:  Quality of Life - 07/02/21 1125       Quality of Life   Select Quality of Life      Quality of Life Scores   Health/Function Pre 22.32 %    Socioeconomic Pre 24.79 %    Psych/Spiritual Pre 23.79 %    Family Pre 24 %    GLOBAL Pre 23.41 %            Scores of 19 and below usually indicate a poorer quality of life in these areas.  A difference of  2-3 points is a clinically meaningful difference.  A difference of 2-3 points in the total score of the Quality of Life Index has been associated with significant improvement in overall quality of life, self-image, physical symptoms, and  general health in studies assessing change in quality of life.  PHQ-9: Recent Review Flowsheet Data     Depression screen [facility] 2/9 07/02/2021 07/02/2021 01/11/2016 09/12/2015   Decreased Interest 0 0 0 0   Down, Depressed, Hopeless 0 0 0 0   PHQ - 2 Score 0 0 0 0      Interpretation of Total Score  Total Score Depression Severity:  1-4 = Minimal depression, 5-9 = Mild depression, 10-14 = Moderate depression, 15-19 = Moderately severe depression, 20-27 = Severe depression   Psychosocial Evaluation and Intervention:   Psychosocial Re-Evaluation:   Psychosocial Discharge (Final Psychosocial Re-Evaluation):   Vocational Rehabilitation: Provide vocational rehab assistance to qualifying candidates.   Vocational Rehab Evaluation & Intervention:  Vocational Rehab - 07/02/21 1044  Initial Vocational Rehab Evaluation & Intervention   Assessment shows need for Vocational Rehabilitation No   Rush Landmark is retired and does not need vocational rehab at this time            Education: Education Goals: Education classes will be provided on a weekly basis, covering required topics. Participant will state understanding/return demonstration of topics presented.  Learning Barriers/Preferences:  Learning Barriers/Preferences - 07/02/21 1112       Learning Barriers/Preferences   Learning Barriers Sight;Hearing   wears reading glasses, HOH   Learning Preferences Audio;Computer/Internet;Group Instruction;Individual Instruction;Pictoral;Skilled Demonstration;Verbal Instruction;Video;Written Material             Education Topics: Hypertension, Hypertension Reduction -Define heart disease and high blood pressure. Discus how high blood pressure affects the body and ways to reduce high blood pressure.   Exercise and Your Heart -Discuss why it is important to exercise, the FITT principles of exercise, normal and abnormal responses to exercise, and how to exercise  safely.   Angina -Discuss definition of angina, causes of angina, treatment of angina, and how to decrease risk of having angina.   Cardiac Medications -Review what the following cardiac medications are used for, how they affect the body, and side effects that may occur when taking the medications.  Medications include Aspirin, Beta blockers, calcium channel blockers, ACE Inhibitors, angiotensin receptor blockers, diuretics, digoxin, and antihyperlipidemics.   Congestive Heart Failure -Discuss the definition of CHF, how to live with CHF, the signs and symptoms of CHF, and how keep track of weight and sodium intake.   Heart Disease and Intimacy -Discus the effect sexual activity has on the heart, how changes occur during intimacy as we age, and safety during sexual activity.   Smoking Cessation / COPD -Discuss different methods to quit smoking, the health benefits of quitting smoking, and the definition of COPD.   Nutrition I: Fats -Discuss the types of cholesterol, what cholesterol does to the heart, and how cholesterol levels can be controlled.   Nutrition II: Labels -Discuss the different components of food labels and how to read food label   Heart Parts/Heart Disease and PAD -Discuss the anatomy of the heart, the pathway of blood circulation through the heart, and these are affected by heart disease.   Stress I: Signs and Symptoms -Discuss the causes of stress, how stress may lead to anxiety and depression, and ways to limit stress.   Stress II: Relaxation -Discuss different types of relaxation techniques to limit stress.   Warning Signs of Stroke / TIA -Discuss definition of a stroke, what the signs and symptoms are of a stroke, and how to identify when someone is having stroke.   Knowledge Questionnaire Score:  Knowledge Questionnaire Score - 07/02/21 1126       Knowledge Questionnaire Score   Pre Score 21/24             Core Components/Risk  Factors/Patient Goals at Admission:  Personal Goals and Risk Factors at Admission - 07/02/21 1126       Core Components/Risk Factors/Patient Goals on Admission    Weight Management Yes;Obesity;Weight Loss    Intervention Weight Management: Develop a combined nutrition and exercise program designed to reach desired caloric intake, while maintaining appropriate intake of nutrient and fiber, sodium and fats, and appropriate energy expenditure required for the weight goal.;Weight Management: Provide education and appropriate resources to help participant work on and attain dietary goals.;Weight Management/Obesity: Establish reasonable short term and long term weight goals.;Obesity: Provide education and appropriate  resources to help participant work on and attain dietary goals.    Admit Weight 227 lb 15.3 oz (103.4 kg)    Expected Outcomes Short Term: Continue to assess and modify interventions until short term weight is achieved;Long Term: Adherence to nutrition and physical activity/exercise program aimed toward attainment of established weight goal;Weight Maintenance: Understanding of the daily nutrition guidelines, which includes 25-35% calories from fat, 7% or less cal from saturated fats, less than '200mg'$  cholesterol, less than 1.5gm of sodium, & 5 or more servings of fruits and vegetables daily;Weight Loss: Understanding of general recommendations for a balanced deficit meal plan, which promotes 1-2 lb weight loss per week and includes a negative energy balance of [phone] kcal/d;Understanding recommendations for meals to include 15-35% energy as protein, 25-35% energy from fat, 35-60% energy from carbohydrates, less than '200mg'$  of dietary cholesterol, 20-35 gm of total fiber daily;Understanding of distribution of calorie intake throughout the day with the consumption of 4-5 meals/snacks    Hypertension Yes    Intervention Provide education on lifestyle modifcations including regular physical  activity/exercise, weight management, moderate sodium restriction and increased consumption of fresh fruit, vegetables, and low fat dairy, alcohol moderation, and smoking cessation.;Monitor prescription use compliance.    Expected Outcomes Short Term: Continued assessment and intervention until BP is < 140/80m HG in hypertensive participants. < 130/849mHG in hypertensive participants with diabetes, heart failure or chronic kidney disease.;Long Term: Maintenance of blood pressure at goal levels.    Lipids Yes    Intervention Provide education and support for participant on nutrition & aerobic/resistive exercise along with prescribed medications to achieve LDL '70mg'$ , HDL >'40mg'$ .    Expected Outcomes Short Term: Participant states understanding of desired cholesterol values and is compliant with medications prescribed. Participant is following exercise prescription and nutrition guidelines.;Long Term: Cholesterol controlled with medications as prescribed, with individualized exercise RX and with personalized nutrition plan. Value goals: LDL < '70mg'$ , HDL > 40 mg.             Core Components/Risk Factors/Patient Goals Review:    Core Components/Risk Factors/Patient Goals at Discharge (Final Review):    ITP Comments:  ITP Comments     Row Name 07/02/21 1042           ITP Comments Dr TrFransico HimD, Medical Director.                Comments: Bill  attended orientation on 07/02/2021 to review rules and guidelines for program.  Completed 6 minute walk test, Intitial ITP, and exercise prescription.  VSS. Telemetry-Sinus Rhythm, first degree heart block, bundle branch block V paced on demand..BRush Landmarkid report having 2/10 chest discomfort towards the end of his 6 minute walk test please see previous documentation for details.. Safety measures and social distancing in place per CDC guidelines. Received okay for Mr GiCarleno proceed with exercise.MaBarnet PallRN,BSN 07/02/2021 11:49 AM

## 2021-07-08 ENCOUNTER — Other Ambulatory Visit: Payer: Self-pay

## 2021-07-08 ENCOUNTER — Encounter (HOSPITAL_COMMUNITY)
Admission: RE | Admit: 2021-07-08 | Discharge: 2021-07-08 | Disposition: A | Discharge status: Home / Self Care | Payer: Medicare Other | Source: Ambulatory Visit | Attending: Interventional Cardiology | Admitting: Interventional Cardiology

## 2021-07-08 DIAGNOSIS — Z955 Presence of coronary angioplasty implant and graft: Secondary | ICD-10-CM | POA: Diagnosis not present

## 2021-07-08 DIAGNOSIS — R079 Chest pain, unspecified: Secondary | ICD-10-CM | POA: Diagnosis not present

## 2021-07-08 DIAGNOSIS — Z48812 Encounter for surgical aftercare following surgery on the circulatory system: Secondary | ICD-10-CM | POA: Diagnosis not present

## 2021-07-08 NOTE — Progress Notes (Signed)
Daily Session Note  Patient Details  Name: Andre Miranda MRN: [id number] Date of Birth: Sep 10, 1936 Referring Provider:   Flowsheet Row CARDIAC REHAB PHASE II ORIENTATION from 07/02/2021 in [place]  Referring Provider Daneen Schick, MD       Encounter Date: 07/08/2021  Check In:  Session Check In - 07/08/21 1318       Check-In   Supervising physician immediately available to respond to emergencies [facility] immediately available    Physician(s) Dr Mal Misty    Location [facility]    Staff Present Barnet Pall, RN, Milus Glazier, MS, ACSM-CEP, CCRP, Exercise Physiologist;Olinty Celesta Aver, MS, ACSM CEP, Exercise Physiologist;Jetta Gilford Rile BS, ACSM EP-C, Exercise Physiologist;Jessica Hassell Done, MS, ACSM-CEP, Exercise Physiologist    Virtual Visit No    Medication changes reported     No    Fall or balance concerns reported    No    Tobacco Cessation No Change    Current number of cigarettes/nicotine per day     0    Warm-up and Cool-down Performed on first and last piece of equipment   cardiac rehab orientation   Resistance Training Performed Yes    VAD Patient? No    PAD/SET Patient? No      Pain Assessment   Currently in Pain? No/denies    Pain Score 0-No pain    Multiple Pain Sites No             Capillary Blood Glucose: No results found for this or any previous visit (from the past 24 hour(s)).    Social History   Tobacco Use  Smoking Status Former   Packs/day: 1.50   Years: 7.00   Pack years: 10.50   Types: Cigarettes   Quit date: 04/03/1971   Years since quitting: 50.2  Smokeless Tobacco Never    Goals Met:  Exercise tolerated well No report of cardiac concerns or symptoms Strength training completed today  Goals Unmet:  Not Applicable  Comments: Bill started cardiac rehab today.  Pt tolerated light exercise without difficulty. VSS, telemetry-Sinus Rhythm with underlying V paced,  asymptomatic.  Medication list reconciled. Pt denies barriers to medicaiton compliance.  PSYCHOSOCIAL ASSESSMENT:  PHQ-0. Pt exhibits positive coping skills, hopeful outlook with supportive family. No psychosocial needs identified at this time, no psychosocial interventions necessary.    Pt enjoys genealogy, reading, family and friends .   Pt oriented to exercise equipment and routine.    Understanding verbalized. Barnet Pall, RN,BSN 07/08/2021 4:33 PM    Dr. Fransico Him is Medical Director for Cardiac Rehab at [facility].

## 2021-07-10 ENCOUNTER — Encounter (HOSPITAL_COMMUNITY)
Admission: RE | Admit: 2021-07-10 | Discharge: 2021-07-10 | Disposition: A | Discharge status: Home / Self Care | Payer: Medicare Other | Source: Ambulatory Visit | Attending: Interventional Cardiology | Admitting: Interventional Cardiology

## 2021-07-10 ENCOUNTER — Other Ambulatory Visit: Payer: Self-pay

## 2021-07-10 DIAGNOSIS — Z48812 Encounter for surgical aftercare following surgery on the circulatory system: Secondary | ICD-10-CM | POA: Diagnosis not present

## 2021-07-10 DIAGNOSIS — R079 Chest pain, unspecified: Secondary | ICD-10-CM | POA: Diagnosis not present

## 2021-07-10 DIAGNOSIS — Z955 Presence of coronary angioplasty implant and graft: Secondary | ICD-10-CM | POA: Diagnosis not present

## 2021-07-11 NOTE — Progress Notes (Signed)
Andre Miranda 85 y.o. male Nutrition Note  Diagnosis: DES SVG Ramus   Past Medical History:  Diagnosis Date   Aortic stenosis    MODERATE   Cancer ([place])    prostate cancer-radiation   Carotid arterial disease ([place])    Coronary artery disease    Dysuria    GERD (gastroesophageal reflux disease)    Gout    Hematuria    History of urinary retention    Hyperlipidemia    Hypertension    Obesity    Sleep apnea    uses CPAP nightly     Medications reviewed.   Current Outpatient Medications:    acetaminophen (TYLENOL) 500 MG tablet, Take 500 mg by mouth every 6 (six) hours as needed for moderate pain., Disp: , Rfl:    allopurinol (ZYLOPRIM) 300 MG tablet, Take 300 mg by mouth daily., Disp: , Rfl:    amLODipine (NORVASC) 5 MG tablet, Take 1 tablet by mouth once daily, Disp: 90 tablet, Rfl: 3   aspirin EC 81 MG tablet, Take 81 mg by mouth at bedtime. , Disp: , Rfl:    azithromycin (ZITHROMAX) 500 MG tablet, Take 500 mg by mouth See admin instructions. Take 500 mg 1 hour prior to dental work, Disp: , Rfl:    Cholecalciferol (VITAMIN D3) 1000 UNITS CAPS, Take 1,000 Units by mouth at bedtime., Disp: , Rfl:    clopidogrel (PLAVIX) 75 MG tablet, Take 1 tablet (75 mg total) by mouth daily with breakfast., Disp: 90 tablet, Rfl: 3   famotidine (PEPCID) 20 MG tablet, Take 20 mg by mouth daily before breakfast., Disp: , Rfl:    furosemide (LASIX) 40 MG tablet, Take 1 tablet (40 mg total) by mouth daily., Disp: 90 tablet, Rfl: 2   isosorbide mononitrate (IMDUR) 60 MG 24 hr tablet, Take 1.5 tablets (90 mg total) by mouth daily., Disp: 45 tablet, Rfl: 11   metoprolol tartrate (LOPRESSOR) 25 MG tablet, Take 1 tablet (25 mg total) by mouth 2 (two) times daily., Disp: 60 tablet, Rfl: 0   miconazole (ZEASORB-AF) 2 % powder, Apply 1 application topically as needed for itching., Disp: , Rfl:    Multiple Vitamins-Minerals (CENTRUM SILVER) tablet, Take 1 tablet by mouth daily with breakfast., Disp: ,  Rfl:    nitroGLYCERIN (NITROSTAT) 0.4 MG SL tablet, Place 1 tablet (0.4 mg total) under the tongue every 5 (five) minutes as needed for chest pain., Disp: 25 tablet, Rfl: 3   polyethylene glycol powder (GLYCOLAX/MIRALAX) 17 GM/SCOOP powder, Take 17 g by mouth daily as needed for moderate constipation., Disp: , Rfl:    PRESCRIPTION MEDICATION, CPAP: At bedtime, Disp: , Rfl:    ramipril (ALTACE) 5 MG capsule, Take 1 capsule (5 mg total) by mouth at bedtime., Disp: 90 capsule, Rfl: 3   rosuvastatin (CRESTOR) 10 MG tablet, Take 10 mg by mouth every evening., Disp: , Rfl:    spironolactone (ALDACTONE) 25 MG tablet, Take 1 tablet (25 mg total) by mouth daily., Disp: 90 tablet, Rfl: 3   Tamsulosin HCl (FLOMAX) 0.4 MG CAPS, Take 0.4 mg by mouth every evening., Disp: , Rfl:    temazepam (RESTORIL) 15 MG capsule, Take 15-30 mg by mouth at bedtime as needed for sleep., Disp: , Rfl:    Ht Readings from Last 1 Encounters:  07/02/21 5' 8.75" (1.746 m)     Wt Readings from Last 3 Encounters:  07/02/21 227 lb 15.3 oz (103.4 kg)  06/24/21 222 lb (100.7 kg)  04/22/21 224 lb (101.6  kg)     There is no height or weight on file to calculate BMI.   Social History   Tobacco Use  Smoking Status Former   Packs/day: 1.50   Years: 7.00   Pack years: 10.50   Types: Cigarettes   Quit date: 04/03/1971   Years since quitting: 50.3  Smokeless Tobacco Never     Lab Results  Component Value Date   CHOL 90 04/23/2019   Lab Results  Component Value Date   HDL 32 (L) 04/23/2019   Lab Results  Component Value Date   LDLCALC 42 04/23/2019   Lab Results  Component Value Date   TRIG 79 04/23/2019     Lab Results  Component Value Date   HGBA1C 5.4 04/22/2019     CBG (last 3)  No results for input(s): GLUCAP in the last 72 hours.   Nutrition Note  Spoke with pt. Nutrition Plan and Nutrition Survey goals reviewed with pt.   Pt has had multiple heart events. He has been through cardiac rehab in  the past. He has made some changes to his diet in the past and feels he has adequate heart healthy nutrition knowledge. Pt does want to make some improvements to his diet. He eats out often for social reasons. He feels he could make healthier choices and reduce portions. He tries to avoid fried foods. He still eats red/processed meats most days. He chooses bacon for breakfast often. He uses butter at home. He feels this could change. He hates non starchy veggies. He eats starchy vegetables. He eats fruit. He does not eat many whole grains. We discussed incorporating whole grains and how to identify them.   Reviewed diet history. He has done [employer] in the past for weight loss. He still tries to reduce carb intake.  Diet recall: Breakfast: 2 bacon, honey wheat bread, orange, 2 cups coffee Lunch: canned [place] cheddar soup with a handful of steamed shrimp, 6 oz 1% milk Dinner: hot dog on honey wheat bread  Snack: airpopped popcorn, 1 decaf coffee  Pt expressed understanding of the information reviewed.     Nutrition Diagnosis Inappropriate intake of saturated fats related excessive consumption of fatty meats and processed and restaurant foods as evidenced by pt's diet recall of red/processed meat 3-4 times per week and report of eating out 5 meals per week and PYP results    Nutrition Intervention Pt's individual nutrition plan reviewed with pt. Benefits of adopting Heart Healthy diet discussed when Picture Your Plate reviewed.  Pt given handouts for: ? Nutrition I class ? Nutrition II class ?  Continue client-centered nutrition education by RD, as part of interdisciplinary care.  Goal(s)  Pt to avoid butter  Pt to cut down on meats high in saturated fat. Pt to eat 3 servings whole grains daily  Plan:  Will provide client-centered nutrition education as part of interdisciplinary care Monitor and evaluate progress toward nutrition goal with team.   Michaele Offer, MS, RDN,  LDN, CDCES

## 2021-07-12 ENCOUNTER — Other Ambulatory Visit: Payer: Self-pay

## 2021-07-12 ENCOUNTER — Encounter (HOSPITAL_COMMUNITY)
Admission: RE | Admit: 2021-07-12 | Discharge: 2021-07-12 | Disposition: A | Discharge status: Home / Self Care | Payer: Medicare Other | Source: Ambulatory Visit | Attending: Interventional Cardiology | Admitting: Interventional Cardiology

## 2021-07-12 DIAGNOSIS — Z955 Presence of coronary angioplasty implant and graft: Secondary | ICD-10-CM

## 2021-07-12 DIAGNOSIS — Z48812 Encounter for surgical aftercare following surgery on the circulatory system: Secondary | ICD-10-CM | POA: Diagnosis not present

## 2021-07-12 DIAGNOSIS — R079 Chest pain, unspecified: Secondary | ICD-10-CM | POA: Diagnosis not present

## 2021-07-15 ENCOUNTER — Other Ambulatory Visit: Payer: Self-pay

## 2021-07-15 ENCOUNTER — Encounter (HOSPITAL_COMMUNITY)
Admission: RE | Admit: 2021-07-15 | Discharge: 2021-07-15 | Disposition: A | Discharge status: Home / Self Care | Payer: Medicare Other | Source: Ambulatory Visit | Attending: Interventional Cardiology | Admitting: Interventional Cardiology

## 2021-07-15 DIAGNOSIS — Z48812 Encounter for surgical aftercare following surgery on the circulatory system: Secondary | ICD-10-CM | POA: Diagnosis not present

## 2021-07-15 DIAGNOSIS — Z955 Presence of coronary angioplasty implant and graft: Secondary | ICD-10-CM

## 2021-07-15 DIAGNOSIS — R079 Chest pain, unspecified: Secondary | ICD-10-CM | POA: Diagnosis not present

## 2021-07-16 ENCOUNTER — Ambulatory Visit (HOSPITAL_COMMUNITY)
Admission: RE | Admit: 2021-07-16 | Discharge: 2021-07-16 | Disposition: A | Discharge status: Home / Self Care | Payer: Medicare Other | Source: Ambulatory Visit | Attending: Cardiology | Admitting: Cardiology

## 2021-07-16 DIAGNOSIS — I701 Atherosclerosis of renal artery: Secondary | ICD-10-CM | POA: Insufficient documentation

## 2021-07-17 ENCOUNTER — Other Ambulatory Visit: Payer: Self-pay

## 2021-07-17 ENCOUNTER — Encounter (HOSPITAL_COMMUNITY)
Admission: RE | Admit: 2021-07-17 | Discharge: 2021-07-17 | Disposition: A | Discharge status: Home / Self Care | Payer: Medicare Other | Source: Ambulatory Visit | Attending: Interventional Cardiology | Admitting: Interventional Cardiology

## 2021-07-17 DIAGNOSIS — Z955 Presence of coronary angioplasty implant and graft: Secondary | ICD-10-CM | POA: Diagnosis not present

## 2021-07-17 DIAGNOSIS — R079 Chest pain, unspecified: Secondary | ICD-10-CM | POA: Diagnosis not present

## 2021-07-17 DIAGNOSIS — Z48812 Encounter for surgical aftercare following surgery on the circulatory system: Secondary | ICD-10-CM | POA: Diagnosis not present

## 2021-07-19 ENCOUNTER — Encounter (HOSPITAL_COMMUNITY)
Admission: RE | Admit: 2021-07-19 | Discharge: 2021-07-19 | Disposition: A | Discharge status: Home / Self Care | Payer: Medicare Other | Source: Ambulatory Visit | Attending: Interventional Cardiology | Admitting: Interventional Cardiology

## 2021-07-19 ENCOUNTER — Other Ambulatory Visit: Payer: Self-pay

## 2021-07-19 DIAGNOSIS — Z955 Presence of coronary angioplasty implant and graft: Secondary | ICD-10-CM | POA: Diagnosis not present

## 2021-07-23 NOTE — Progress Notes (Signed)
Pt has been made aware of normal result and verbalized understanding.  jw

## 2021-07-24 ENCOUNTER — Other Ambulatory Visit: Payer: Self-pay

## 2021-07-24 ENCOUNTER — Encounter (HOSPITAL_COMMUNITY)
Admission: RE | Admit: 2021-07-24 | Discharge: 2021-07-24 | Disposition: A | Discharge status: Home / Self Care | Payer: Medicare Other | Source: Ambulatory Visit | Attending: Interventional Cardiology | Admitting: Interventional Cardiology

## 2021-07-24 DIAGNOSIS — Z955 Presence of coronary angioplasty implant and graft: Secondary | ICD-10-CM

## 2021-07-25 ENCOUNTER — Other Ambulatory Visit: Payer: Self-pay | Admitting: *Deleted

## 2021-07-25 MED ORDER — METOPROLOL TARTRATE 25 MG PO TABS: 25.0000 mg | ORAL_TABLET | Freq: Two times a day (BID) | ORAL | 3 refills | Status: AC

## 2021-07-26 ENCOUNTER — Other Ambulatory Visit: Payer: Self-pay

## 2021-07-26 ENCOUNTER — Ambulatory Visit: Payer: Medicare Other | Admitting: Interventional Cardiology

## 2021-07-26 ENCOUNTER — Encounter (HOSPITAL_COMMUNITY)
Admission: RE | Admit: 2021-07-26 | Discharge: 2021-07-26 | Disposition: A | Discharge status: Home / Self Care | Payer: Medicare Other | Source: Ambulatory Visit | Attending: Interventional Cardiology | Admitting: Interventional Cardiology

## 2021-07-26 DIAGNOSIS — Z955 Presence of coronary angioplasty implant and graft: Secondary | ICD-10-CM

## 2021-07-29 ENCOUNTER — Other Ambulatory Visit: Payer: Self-pay

## 2021-07-29 ENCOUNTER — Encounter (HOSPITAL_COMMUNITY)
Admission: RE | Admit: 2021-07-29 | Discharge: 2021-07-29 | Disposition: A | Discharge status: Home / Self Care | Payer: Medicare Other | Source: Ambulatory Visit | Attending: Interventional Cardiology | Admitting: Interventional Cardiology

## 2021-07-29 DIAGNOSIS — Z955 Presence of coronary angioplasty implant and graft: Secondary | ICD-10-CM | POA: Diagnosis not present

## 2021-07-29 NOTE — Progress Notes (Signed)
Reviewed home exercise Rx with patient. Pt is not currently exercising but is agreeable to add 2-3 days of walking in addition to the CRP2 program. Encouraged warm-up, cool-down, and stretching. Reviewed THRR of 55-110 and keeping RPE between 11-13. Encouraged hydration with activity. Discussed weather parameters for temperature and humidity for safe exercise outdoors. Reviewed S/S to terminate exercise and when to call MD vs 911. Reviewed use of NTG and encouraged to carry at all times. Pt verbalized understanding of the home exercise Rx and was provided a copy.   Lesly Rubenstein MS, ACSM-CEP, CCRP

## 2021-07-30 NOTE — Progress Notes (Signed)
Cardiac Individual Treatment Plan  Patient Details  Name: Andre Miranda MRN: [id number] Date of Birth: 12-20-1935 Referring Provider:   Flowsheet Row CARDIAC REHAB PHASE II ORIENTATION from 07/02/2021 in [facility]  Referring Provider Daneen Schick, MD       Initial Encounter Date:  [place] PHASE II ORIENTATION from 07/02/2021 in [facility]  Date 07/02/21       Visit Diagnosis: 04/03/21 S/P DES SVG   Patient's Home Medications on Admission:  Current Outpatient Medications:    acetaminophen (TYLENOL) 500 MG tablet, Take 500 mg by mouth every 6 (six) hours as needed for moderate pain., Disp: , Rfl:    allopurinol (ZYLOPRIM) 300 MG tablet, Take 300 mg by mouth daily., Disp: , Rfl:    amLODipine (NORVASC) 5 MG tablet, Take 1 tablet by mouth once daily, Disp: 90 tablet, Rfl: 3   aspirin EC 81 MG tablet, Take 81 mg by mouth at bedtime. , Disp: , Rfl:    azithromycin (ZITHROMAX) 500 MG tablet, Take 500 mg by mouth See admin instructions. Take 500 mg 1 hour prior to dental work, Disp: , Rfl:    Cholecalciferol (VITAMIN D3) 1000 UNITS CAPS, Take 1,000 Units by mouth at bedtime., Disp: , Rfl:    clopidogrel (PLAVIX) 75 MG tablet, Take 1 tablet (75 mg total) by mouth daily with breakfast., Disp: 90 tablet, Rfl: 3   famotidine (PEPCID) 20 MG tablet, Take 20 mg by mouth daily before breakfast., Disp: , Rfl:    furosemide (LASIX) 40 MG tablet, Take 1 tablet (40 mg total) by mouth daily., Disp: 90 tablet, Rfl: 2   isosorbide mononitrate (IMDUR) 60 MG 24 hr tablet, Take 1.5 tablets (90 mg total) by mouth daily., Disp: 45 tablet, Rfl: 11   metoprolol tartrate (LOPRESSOR) 25 MG tablet, Take 1 tablet (25 mg total) by mouth 2 (two) times daily., Disp: 180 tablet, Rfl: 3   miconazole (ZEASORB-AF) 2 % powder, Apply 1 application topically as needed for itching., Disp: , Rfl:    Multiple Vitamins-Minerals (CENTRUM SILVER)  tablet, Take 1 tablet by mouth daily with breakfast., Disp: , Rfl:    nitroGLYCERIN (NITROSTAT) 0.4 MG SL tablet, Place 1 tablet (0.4 mg total) under the tongue every 5 (five) minutes as needed for chest pain., Disp: 25 tablet, Rfl: 3   polyethylene glycol powder (GLYCOLAX/MIRALAX) 17 GM/SCOOP powder, Take 17 g by mouth daily as needed for moderate constipation., Disp: , Rfl:    PRESCRIPTION MEDICATION, CPAP: At bedtime, Disp: , Rfl:    ramipril (ALTACE) 5 MG capsule, Take 1 capsule (5 mg total) by mouth at bedtime., Disp: 90 capsule, Rfl: 3   rosuvastatin (CRESTOR) 10 MG tablet, Take 10 mg by mouth every evening., Disp: , Rfl:    spironolactone (ALDACTONE) 25 MG tablet, Take 1 tablet (25 mg total) by mouth daily., Disp: 90 tablet, Rfl: 3   Tamsulosin HCl (FLOMAX) 0.4 MG CAPS, Take 0.4 mg by mouth every evening., Disp: , Rfl:    temazepam (RESTORIL) 15 MG capsule, Take 15-30 mg by mouth at bedtime as needed for sleep., Disp: , Rfl:   Past Medical History: Past Medical History:  Diagnosis Date   Aortic stenosis    MODERATE   Cancer ([place])    prostate cancer-radiation   Carotid arterial disease ([place])    Coronary artery disease    Dysuria    GERD (gastroesophageal reflux disease)    Gout  Hematuria    History of urinary retention    Hyperlipidemia    Hypertension    Obesity    Sleep apnea    uses CPAP nightly    Tobacco Use: Social History   Tobacco Use  Smoking Status Former   Packs/day: 1.50   Years: 7.00   Pack years: 10.50   Types: Cigarettes   Quit date: 04/03/1971   Years since quitting: 50.3  Smokeless Tobacco Never    Labs: Recent Review Flowsheet Data     Labs for ITP Cardiac and Pulmonary Rehab Latest Ref Rng & Units 11/29/2014 04/22/2019 04/23/2019   Cholestrol 0 - 200 mg/dL 109 - 90   LDLCALC 0 - 99 mg/dL 46 - 42   HDL >40 mg/dL 42.40 - 32(L)   Trlycerides <150 mg/dL 104.0 - 79   Hemoglobin A1c 4.8 - 5.6 % - 5.4 -       Capillary Blood Glucose: Lab  Results  Component Value Date   GLUCAP 134 (H) 09/10/2019     Exercise Target Goals: Exercise Program Goal: Individual exercise prescription set using results from initial 6 min walk test and THRR while considering  patient's activity barriers and safety.   Exercise Prescription Goal: Starting with aerobic activity 30 plus minutes a day, 3 days per week for initial exercise prescription. Provide home exercise prescription and guidelines that participant acknowledges understanding prior to discharge.  Activity Barriers & Risk Stratification:  Activity Barriers & Cardiac Risk Stratification - 07/02/21 1045       Activity Barriers & Cardiac Risk Stratification   Activity Barriers Chest Pain/Angina;Balance Concerns;Arthritis    Cardiac Risk Stratification High             6 Minute Walk:  6 Minute Walk     Row Name 07/02/21 0953         6 Minute Walk   Phase Initial     Distance 1314 feet     Walk Time 6 minutes     # of Rest Breaks 0     MPH 2.49     METS 2.07     RPE 13     Perceived Dyspnea  0     VO2 Peak 7.26     Symptoms Yes (comment)     Comments Chest pain (central chest/sternum) 2/10 resolved within 2 minutes. No other symptoms     Resting HR 54 bpm     Resting BP 120/68     Resting Oxygen Saturation  98 %     Exercise Oxygen Saturation  during 6 min walk 98 %     Max Ex. HR 111 bpm     Max Ex. BP 138/70     2 Minute Post BP 122/70              Oxygen Initial Assessment:   Oxygen Re-Evaluation:   Oxygen Discharge (Final Oxygen Re-Evaluation):   Initial Exercise Prescription:  Initial Exercise Prescription - 07/02/21 1000       Date of Initial Exercise RX and Referring Provider   Date 07/02/21    Referring Provider Daneen Schick, MD    Expected Discharge Date 08/30/21      NuStep   Level 2    SPM 75    Minutes 30    METs 2      Prescription Details   Frequency (times per week) 3    Duration Progress to 30 minutes of continuous  aerobic without signs/symptoms of physical distress  Intensity   THRR 40-80% of Max Heartrate 55-110    Ratings of Perceived Exertion 11-13    Perceived Dyspnea 0-4      Progression   Progression Continue progressive overload as per policy without signs/symptoms or physical distress.      Resistance Training   Training Prescription Yes    Weight 3 lbs    Reps 10-15             Perform Capillary Blood Glucose checks as needed.  Exercise Prescription Changes:   Exercise Prescription Changes     Row Name 07/08/21 1500 07/26/21 1600           Response to Exercise   Blood Pressure (Admit) 108/52 96/52      Blood Pressure (Exercise) 118/66 110/72      Blood Pressure (Exit) 102/27 98/58      Heart Rate (Admit) 63 bpm 69 bpm      Heart Rate (Exercise) 90 bpm 82 bpm      Heart Rate (Exit) 63 bpm 63 bpm      Rating of Perceived Exertion (Exercise) 10 12      Symptoms None None      Comments Pt's first day in the CRP2 program Reviewed METs and Home exercise Rx      Duration Continue with 30 min of aerobic exercise without signs/symptoms of physical distress. Continue with 30 min of aerobic exercise without signs/symptoms of physical distress.      Intensity THRR unchanged THRR unchanged             Progression   Progression Continue to progress workloads to maintain intensity without signs/symptoms of physical distress. Continue to progress workloads to maintain intensity without signs/symptoms of physical distress.      Average METs 2.1 2.5             Resistance Training   Training Prescription Yes Yes      Weight 3 lbs 3 lbs      Reps 10-15 10-15      Time 10 Minutes 10 Minutes             Interval Training   Interval Training No No             NuStep   Level 2 4      SPM 80 85      Minutes 30 30      METs 2.1 2.5             Home Exercise Plan   Plans to continue exercise at -- Home (comment)      Frequency -- Add 2 additional days to program  exercise sessions.      Initial Home Exercises Provided -- 07/26/21               Exercise Comments:   Exercise Comments     Row Name 07/08/21 1532 07/26/21 1625         Exercise Comments Pt's first day in the CRP2 program. Pt is off to a good start. He tolerated todays session well. Reviewed home exercise Rx with patient today. Pt verbalized understanding of the home exercise Rx and was provided a copy.               Exercise Goals and Review:   Exercise Goals     Row Name 07/02/21 1046             Exercise Goals   Increase Physical Activity Yes  Intervention Provide advice, education, support and counseling about physical activity/exercise needs.;Develop an individualized exercise prescription for aerobic and resistive training based on initial evaluation findings, risk stratification, comorbidities and participant's personal goals.       Expected Outcomes Short Term: Attend rehab on a regular basis to increase amount of physical activity.;Long Term: Add in home exercise to make exercise part of routine and to increase amount of physical activity.;Long Term: Exercising regularly at least 3-5 days a week.       Increase Strength and Stamina Yes       Intervention Provide advice, education, support and counseling about physical activity/exercise needs.;Develop an individualized exercise prescription for aerobic and resistive training based on initial evaluation findings, risk stratification, comorbidities and participant's personal goals.       Expected Outcomes Short Term: Increase workloads from initial exercise prescription for resistance, speed, and METs.;Short Term: Perform resistance training exercises routinely during rehab and add in resistance training at home;Long Term: Improve cardiorespiratory fitness, muscular endurance and strength as measured by increased METs and functional capacity (6MWT)       Able to understand and use rate of perceived exertion (RPE)  scale Yes       Intervention Provide education and explanation on how to use RPE scale       Expected Outcomes Short Term: Able to use RPE daily in rehab to express subjective intensity level;Long Term:  Able to use RPE to guide intensity level when exercising independently       Knowledge and understanding of Target Heart Rate Range (THRR) Yes       Intervention Provide education and explanation of THRR including how the numbers were predicted and where they are located for reference       Expected Outcomes Short Term: Able to state/look up THRR;Long Term: Able to use THRR to govern intensity when exercising independently;Short Term: Able to use daily as guideline for intensity in rehab       Understanding of Exercise Prescription Yes       Intervention Provide education, explanation, and written materials on patient's individual exercise prescription       Expected Outcomes Short Term: Able to explain program exercise prescription;Long Term: Able to explain home exercise prescription to exercise independently                Exercise Goals Re-Evaluation :  Exercise Goals Re-Evaluation     Row Name 07/08/21 1530 07/26/21 1622           Exercise Goal Re-Evaluation   Exercise Goals Review Increase Physical Activity;Increase Strength and Stamina;Able to understand and use rate of perceived exertion (RPE) scale;Knowledge and understanding of Target Heart Rate Range (THRR);Understanding of Exercise Prescription Increase Physical Activity;Increase Strength and Stamina;Able to understand and use rate of perceived exertion (RPE) scale;Knowledge and understanding of Target Heart Rate Range (THRR);Able to check pulse independently;Understanding of Exercise Prescription      Comments Pt's first day in the CRP2 program. Pt understands the exercise Rx, THRR, and RPE scale. Reviewed METs and home exercise Rx. Pt is not currently walking at home but willing to start walking at home 2x/week for 30 minutes  in addtion to his CRP2 program.      Expected Outcomes Will continue to monitor patient and progress exercise workloads as tolerated. Pt will walk at home 2 x/week.                Discharge Exercise Prescription (Final Exercise Prescription Changes):  Exercise Prescription  Changes - 07/26/21 1600       Response to Exercise   Blood Pressure (Admit) 96/52    Blood Pressure (Exercise) 110/72    Blood Pressure (Exit) 98/58    Heart Rate (Admit) 69 bpm    Heart Rate (Exercise) 82 bpm    Heart Rate (Exit) 63 bpm    Rating of Perceived Exertion (Exercise) 12    Symptoms None    Comments Reviewed METs and Home exercise Rx    Duration Continue with 30 min of aerobic exercise without signs/symptoms of physical distress.    Intensity THRR unchanged      Progression   Progression Continue to progress workloads to maintain intensity without signs/symptoms of physical distress.    Average METs 2.5      Resistance Training   Training Prescription Yes    Weight 3 lbs    Reps 10-15    Time 10 Minutes      Interval Training   Interval Training No      NuStep   Level 4    SPM 85    Minutes 30    METs 2.5      Home Exercise Plan   Plans to continue exercise at Home (comment)    Frequency Add 2 additional days to program exercise sessions.    Initial Home Exercises Provided 07/26/21             Nutrition:  Target Goals: Understanding of nutrition guidelines, daily intake of sodium '1500mg'$ , cholesterol '200mg'$ , calories 30% from fat and 7% or less from saturated fats, daily to have 5 or more servings of fruits and vegetables.  Biometrics:  Pre Biometrics - 07/02/21 0920       Pre Biometrics   Waist Circumference 46.25 inches    Hip Circumference 45.5 inches    Waist to Hip Ratio 1.02 %    Triceps Skinfold 21 mm    % Body Fat 34.6 %    Grip Strength 35 kg    Flexibility 12 in    Single Leg Stand 4.68 seconds              Nutrition Therapy Plan and Nutrition  Goals:  Nutrition Therapy & Goals - 07/11/21 0829       Nutrition Therapy   Diet TLC    Drug/Food Interactions Statins/Certain Fruits      Personal Nutrition Goals   Nutrition Goal Pt to avoid butter    Personal Goal #2 Pt to cut down on meats high in saturated fat.    Personal Goal #3 Pt to eat 3 servings whole grains daily      Intervention Plan   Intervention Prescribe, educate and counsel regarding individualized specific dietary modifications aiming towards targeted core components such as weight, hypertension, lipid management, diabetes, heart failure and other comorbidities.;Nutrition handout(s) given to patient.    Expected Outcomes Long Term Goal: Adherence to prescribed nutrition plan.;Short Term Goal: A plan has been developed with personal nutrition goals set during dietitian appointment.             Nutrition Assessments:  MEDIFICTS Score Key: ?70 Need to make dietary changes  40-70 Heart Healthy Diet ? 40 Therapeutic Level Cholesterol Diet  Flowsheet Row CARDIAC REHAB PHASE II EXERCISE from 07/10/2021 in [facility]  Picture Your Plate Total Score on Admission 58      Picture Your Plate Scores: [id number] Unhealthy dietary pattern with much room for improvement. 41-50 Dietary pattern unlikely  to meet recommendations for good health and room for improvement. 51-60 More healthful dietary pattern, with some room for improvement.  >60 Healthy dietary pattern, although there may be some specific behaviors that could be improved.    Nutrition Goals Re-Evaluation:  Nutrition Goals Re-Evaluation     [place] Name 07/11/21 0830 07/29/21 [id number]           Goals   Current Weight 227 lb (103 kg) 223 lb 15.8 oz (101.6 kg)      Nutrition Goal Pt to avoid butter Pt to avoid butter             Personal Goal #2 Re-Evaluation   Personal Goal #2 Pt to cut down on meats high in saturated fat. Pt to cut down on meats high in saturated fat.              Personal Goal #3 Re-Evaluation   Personal Goal #3 Pt to eat 3 servings whole grains daily Pt to eat 3 servings whole grains daily               Nutrition Goals Discharge (Final Nutrition Goals Re-Evaluation):  Nutrition Goals Re-Evaluation - 07/29/21 0952       Goals   Current Weight 223 lb 15.8 oz (101.6 kg)    Nutrition Goal Pt to avoid butter      Personal Goal #2 Re-Evaluation   Personal Goal #2 Pt to cut down on meats high in saturated fat.      Personal Goal #3 Re-Evaluation   Personal Goal #3 Pt to eat 3 servings whole grains daily             Psychosocial: Target Goals: Acknowledge presence or absence of significant depression and/or stress, maximize coping skills, provide positive support system. Participant is able to verbalize types and ability to use techniques and skills needed for reducing stress and depression.  Initial Review & Psychosocial Screening:  Initial Psych Review & Screening - 07/02/21 1042       Initial Review   Current issues with None Identified      Family Dynamics   Good Support System? Yes   Rush Landmark has his wife children and grandchildren for support.     Barriers   Psychosocial barriers to participate in program The patient should benefit from training in stress management and relaxation.      Screening Interventions   Interventions Encouraged to exercise             Quality of Life Scores:  Quality of Life - 07/02/21 1125       Quality of Life   Select Quality of Life      Quality of Life Scores   Health/Function Pre 22.32 %    Socioeconomic Pre 24.79 %    Psych/Spiritual Pre 23.79 %    Family Pre 24 %    GLOBAL Pre 23.41 %            Scores of 19 and below usually indicate a poorer quality of life in these areas.  A difference of  2-3 points is a clinically meaningful difference.  A difference of 2-3 points in the total score of the Quality of Life Index has been associated with significant improvement in  overall quality of life, self-image, physical symptoms, and general health in studies assessing change in quality of life.  PHQ-9: Recent Review Flowsheet Data     Depression screen [facility] 2/9 07/02/2021 07/02/2021 01/11/2016 09/12/2015   Decreased Interest 0  0 0 0   Down, Depressed, Hopeless 0 0 0 0   PHQ - 2 Score 0 0 0 0      Interpretation of Total Score  Total Score Depression Severity:  1-4 = Minimal depression, 5-9 = Mild depression, 10-14 = Moderate depression, 15-19 = Moderately severe depression, 20-27 = Severe depression   Psychosocial Evaluation and Intervention:   Psychosocial Re-Evaluation:  Psychosocial Re-Evaluation     [place] Name 07/08/21 1636 07/30/21 1444           Psychosocial Re-Evaluation   Current issues with None Identified None Identified      Interventions Encouraged to attend Cardiac Rehabilitation for the exercise Encouraged to attend Cardiac Rehabilitation for the exercise      Continue Psychosocial Services  No Follow up required No Follow up required               Psychosocial Discharge (Final Psychosocial Re-Evaluation):  Psychosocial Re-Evaluation - 07/30/21 1444       Psychosocial Re-Evaluation   Current issues with None Identified    Interventions Encouraged to attend Cardiac Rehabilitation for the exercise    Continue Psychosocial Services  No Follow up required             Vocational Rehabilitation: Provide vocational rehab assistance to qualifying candidates.   Vocational Rehab Evaluation & Intervention:  Vocational Rehab - 07/02/21 1044       Initial Vocational Rehab Evaluation & Intervention   Assessment shows need for Vocational Rehabilitation No   Rush Landmark is retired and does not need vocational rehab at this time            Education: Education Goals: Education classes will be provided on a weekly basis, covering required topics. Participant will state understanding/return demonstration of topics  presented.  Learning Barriers/Preferences:  Learning Barriers/Preferences - 07/02/21 1112       Learning Barriers/Preferences   Learning Barriers Sight;Hearing   wears reading glasses, HOH   Learning Preferences Audio;Computer/Internet;Group Instruction;Individual Instruction;Pictoral;Skilled Demonstration;Verbal Instruction;Video;Written Material             Education Topics: Hypertension, Hypertension Reduction -Define heart disease and high blood pressure. Discus how high blood pressure affects the body and ways to reduce high blood pressure.   Exercise and Your Heart -Discuss why it is important to exercise, the FITT principles of exercise, normal and abnormal responses to exercise, and how to exercise safely.   Angina -Discuss definition of angina, causes of angina, treatment of angina, and how to decrease risk of having angina.   Cardiac Medications -Review what the following cardiac medications are used for, how they affect the body, and side effects that may occur when taking the medications.  Medications include Aspirin, Beta blockers, calcium channel blockers, ACE Inhibitors, angiotensin receptor blockers, diuretics, digoxin, and antihyperlipidemics.   Congestive Heart Failure -Discuss the definition of CHF, how to live with CHF, the signs and symptoms of CHF, and how keep track of weight and sodium intake.   Heart Disease and Intimacy -Discus the effect sexual activity has on the heart, how changes occur during intimacy as we age, and safety during sexual activity.   Smoking Cessation / COPD -Discuss different methods to quit smoking, the health benefits of quitting smoking, and the definition of COPD.   Nutrition I: Fats -Discuss the types of cholesterol, what cholesterol does to the heart, and how cholesterol levels can be controlled.   Nutrition II: Labels -Discuss the different components of food labels and how to  read food label   Heart Parts/Heart  Disease and PAD -Discuss the anatomy of the heart, the pathway of blood circulation through the heart, and these are affected by heart disease.   Stress I: Signs and Symptoms -Discuss the causes of stress, how stress may lead to anxiety and depression, and ways to limit stress.   Stress II: Relaxation -Discuss different types of relaxation techniques to limit stress.   Warning Signs of Stroke / TIA -Discuss definition of a stroke, what the signs and symptoms are of a stroke, and how to identify when someone is having stroke.   Knowledge Questionnaire Score:  Knowledge Questionnaire Score - 07/02/21 1126       Knowledge Questionnaire Score   Pre Score 21/24             Core Components/Risk Factors/Patient Goals at Admission:  Personal Goals and Risk Factors at Admission - 07/02/21 1126       Core Components/Risk Factors/Patient Goals on Admission    Weight Management Yes;Obesity;Weight Loss    Intervention Weight Management: Develop a combined nutrition and exercise program designed to reach desired caloric intake, while maintaining appropriate intake of nutrient and fiber, sodium and fats, and appropriate energy expenditure required for the weight goal.;Weight Management: Provide education and appropriate resources to help participant work on and attain dietary goals.;Weight Management/Obesity: Establish reasonable short term and long term weight goals.;Obesity: Provide education and appropriate resources to help participant work on and attain dietary goals.    Admit Weight 227 lb 15.3 oz (103.4 kg)    Expected Outcomes Short Term: Continue to assess and modify interventions until short term weight is achieved;Long Term: Adherence to nutrition and physical activity/exercise program aimed toward attainment of established weight goal;Weight Maintenance: Understanding of the daily nutrition guidelines, which includes 25-35% calories from fat, 7% or less cal from saturated fats, less  than '200mg'$  cholesterol, less than 1.5gm of sodium, & 5 or more servings of fruits and vegetables daily;Weight Loss: Understanding of general recommendations for a balanced deficit meal plan, which promotes 1-2 lb weight loss per week and includes a negative energy balance of [phone] kcal/d;Understanding recommendations for meals to include 15-35% energy as protein, 25-35% energy from fat, 35-60% energy from carbohydrates, less than '200mg'$  of dietary cholesterol, 20-35 gm of total fiber daily;Understanding of distribution of calorie intake throughout the day with the consumption of 4-5 meals/snacks    Hypertension Yes    Intervention Provide education on lifestyle modifcations including regular physical activity/exercise, weight management, moderate sodium restriction and increased consumption of fresh fruit, vegetables, and low fat dairy, alcohol moderation, and smoking cessation.;Monitor prescription use compliance.    Expected Outcomes Short Term: Continued assessment and intervention until BP is < 140/21m HG in hypertensive participants. < 130/872mHG in hypertensive participants with diabetes, heart failure or chronic kidney disease.;Long Term: Maintenance of blood pressure at goal levels.    Lipids Yes    Intervention Provide education and support for participant on nutrition & aerobic/resistive exercise along with prescribed medications to achieve LDL '70mg'$ , HDL >'40mg'$ .    Expected Outcomes Short Term: Participant states understanding of desired cholesterol values and is compliant with medications prescribed. Participant is following exercise prescription and nutrition guidelines.;Long Term: Cholesterol controlled with medications as prescribed, with individualized exercise RX and with personalized nutrition plan. Value goals: LDL < '70mg'$ , HDL > 40 mg.             Core Components/Risk Factors/Patient Goals Review:   Goals and Risk Factor  Review     Row Name 07/08/21 1637 07/30/21 1444            Core Components/Risk Factors/Patient Goals Review   Personal Goals Review Weight Management/Obesity;Hypertension;Lipids Weight Management/Obesity;Hypertension;Lipids      Review Bill started exercise at cardiac rehab on 07/08/21 and did well with exercise for his fitness level. VSS Bill continues to do well with exercise at phase 2 cardiac rehab. Resting systolic BP's have been in the 90's will continue to monitor BP. Patient asymptomatic.      Expected Outcomes Bill will contionue to participate in phase 2 cardiac rehab for exercise, nutrition and lifestyle modifications Bill will contionue to participate in phase 2 cardiac rehab for exercise, nutrition and lifestyle modifications               Core Components/Risk Factors/Patient Goals at Discharge (Final Review):   Goals and Risk Factor Review - 07/30/21 1444       Core Components/Risk Factors/Patient Goals Review   Personal Goals Review Weight Management/Obesity;Hypertension;Lipids    Review Rush Landmark continues to do well with exercise at phase 2 cardiac rehab. Resting systolic BP's have been in the 90's will continue to monitor BP. Patient asymptomatic.    Expected Outcomes Bill will contionue to participate in phase 2 cardiac rehab for exercise, nutrition and lifestyle modifications             ITP Comments:  ITP Comments     Row Name 07/02/21 1042 07/08/21 1633 07/30/21 1442       ITP Comments Dr Fransico Him MD, Medical Director. 30 Day ITP Review. Bill started exercise at cardiac rehab on 07/08/21 and did well with exercise for his fitness level. 30 Day ITP Review. Rush Landmark has good attendance and participation in phase 2 cardiac rehab.              Comments: See ITP comments.Harrell Gave RN BSN

## 2021-07-31 ENCOUNTER — Other Ambulatory Visit: Payer: Self-pay

## 2021-07-31 ENCOUNTER — Telehealth: Payer: Self-pay | Admitting: Medical

## 2021-07-31 ENCOUNTER — Encounter (HOSPITAL_COMMUNITY)
Admission: RE | Admit: 2021-07-31 | Discharge: 2021-07-31 | Disposition: A | Discharge status: Home / Self Care | Payer: Medicare Other | Source: Ambulatory Visit | Attending: Interventional Cardiology | Admitting: Interventional Cardiology

## 2021-07-31 DIAGNOSIS — Z955 Presence of coronary angioplasty implant and graft: Secondary | ICD-10-CM | POA: Diagnosis not present

## 2021-07-31 NOTE — Progress Notes (Signed)
Andre Miranda reports feeling light headed when changing positions at home. Orthostatic blood pressures checked and are as follows. 118/56. Lying BP 100/46. Standing BP 96/48. Andre Miranda exercised today without complaints post exercise BP 88/40 via auto cuff. Patient asymptomatic. Recheck sitting BP 100/54. Standing BP 87/53. Roby Lofts [facility] paged and notified. Krista instructed Andre Miranda to hold off on taking his aldactone for now. Patient states understanding. Recheck exit Blood pressure 117/57. Andre Miranda said that he will bring his home BP monitor on Friday for comparison.Andre Miranda left cardiac rehab without complaints or symptoms. Will fax exercise flow sheets to Dr. Thompson Caul office for review.Barnet Pall, RN,BSN 07/31/2021 2:59 PM

## 2021-07-31 NOTE — Telephone Encounter (Signed)
   Received a call from [facility] in cardiac rehab with concerns for hypotension. He reported occasional lightheadedness with changing positions. Orthostatic vitals today: 118/56 laying, 100/46 sitting, and 94/48 standing. Verdis Frederickson notes he frequently has SBP in the 90s at their evaluation. Mr. Tiet states SBP is often in the 130s at home. He plans to bring his BP cuff to his next rehab visit to check calibration. He is on numerous BP medications. He has no history of low EF. Favor stopping spironolactone at this time and monitoring response. Suggested compression stockings while exercising. He has an upcoming visit with Dr. Tamala Julian 08/13/21. Encouraged him to keep a BP log AM/PM to bring to his follow-up visit. He was in agreement with the plan. Will route to Dr. Tamala Julian as Juluis Rainier.  Abigail Butts, PA-C 07/31/21; 2:41 PM

## 2021-08-02 ENCOUNTER — Encounter (HOSPITAL_COMMUNITY)
Admission: RE | Admit: 2021-08-02 | Discharge: 2021-08-02 | Disposition: A | Discharge status: Home / Self Care | Payer: Medicare Other | Source: Ambulatory Visit | Attending: Interventional Cardiology | Admitting: Interventional Cardiology

## 2021-08-02 ENCOUNTER — Other Ambulatory Visit: Payer: Self-pay

## 2021-08-02 DIAGNOSIS — Z955 Presence of coronary angioplasty implant and graft: Secondary | ICD-10-CM | POA: Diagnosis not present

## 2021-08-05 ENCOUNTER — Other Ambulatory Visit: Payer: Self-pay

## 2021-08-05 ENCOUNTER — Encounter (HOSPITAL_COMMUNITY)
Admission: RE | Admit: 2021-08-05 | Discharge: 2021-08-05 | Disposition: A | Discharge status: Home / Self Care | Payer: Medicare Other | Source: Ambulatory Visit | Attending: Interventional Cardiology | Admitting: Interventional Cardiology

## 2021-08-05 DIAGNOSIS — Z955 Presence of coronary angioplasty implant and graft: Secondary | ICD-10-CM | POA: Diagnosis not present

## 2021-08-07 ENCOUNTER — Other Ambulatory Visit: Payer: Self-pay

## 2021-08-07 ENCOUNTER — Encounter (HOSPITAL_COMMUNITY)
Admission: RE | Admit: 2021-08-07 | Discharge: 2021-08-07 | Disposition: A | Discharge status: Home / Self Care | Payer: Medicare Other | Source: Ambulatory Visit | Attending: Interventional Cardiology | Admitting: Interventional Cardiology

## 2021-08-07 DIAGNOSIS — Z955 Presence of coronary angioplasty implant and graft: Secondary | ICD-10-CM

## 2021-08-07 NOTE — Progress Notes (Addendum)
Bill'sHeart rate appears to be in the upper 30's low 40. Telemetry rhythm V paced with underlying p waves visible intermittent PVC's. I checked the patient's pulse manually it was 52. Patient asymptomatic. Post exercise BP 95/44. Roby Lofts [facility] paged and notified. Ordered 12 lead ECG. 12 lead ECG obtained. Daleen Snook reviewed the ECG. No new orders received. Bill left cardiac rehab without complaints or symptoms. Will fax today's ECG tracings to [facility] for review.Barnet Pall, RN,BSN 08/07/2021 4:14 PM

## 2021-08-09 ENCOUNTER — Telehealth: Payer: Self-pay | Admitting: Interventional Cardiology

## 2021-08-09 ENCOUNTER — Encounter (HOSPITAL_COMMUNITY)
Admission: RE | Admit: 2021-08-09 | Discharge: 2021-08-09 | Disposition: A | Discharge status: Home / Self Care | Payer: Medicare Other | Source: Ambulatory Visit | Attending: Interventional Cardiology | Admitting: Interventional Cardiology

## 2021-08-09 ENCOUNTER — Other Ambulatory Visit: Payer: Self-pay

## 2021-08-09 DIAGNOSIS — Z955 Presence of coronary angioplasty implant and graft: Secondary | ICD-10-CM

## 2021-08-09 NOTE — Telephone Encounter (Signed)
   Maria with cardiac, wated to let Dr. Tamala Julian and Anderson Malta know she is faxing about the pt's visit today at the cardiac rehab. She said she consulted Roby Lofts about it today as well.

## 2021-08-09 NOTE — Progress Notes (Signed)
Andre Miranda's weight was noted at 104.5 kg today. Weight on Wednesday 102.3 kg. Upon assessment lung fields clear upon ascultation . Trace lower extremity edema noted. Andre Miranda denied shortness of breath today. Andre Miranda did report that he had been eating out more this week and had some marinated chicken and chicken soup from [employer]. Entry blood pressure 115/54. Oxygen saturation 98% on room air. Andre Miranda proceeded to exercise without complaints until the end of exercise. Andre Miranda reported having 2/10 chest pressure this went away with rest. Roby Lofts [facility] paged and notified. Will have Andre Miranda hold off on exercising on Monday until he sees Dr Tamala Julian on Tuesday. Andre Miranda agreeable to the plan and left cardiac rehab without complaints. Exit BP 102/60. Heart rate 59. Telemetry rhythm Vpaced with PVC's.Will fax exercise flow sheets to Dr. Thompson Caul  office for review. Barnet Pall, RN,BSN 08/09/2021 4:49 PM

## 2021-08-09 NOTE — Telephone Encounter (Signed)
Spoke with Verdis Frederickson and pt had chest pressure during exercise today that resolved with rest Per Verdis Frederickson consulted with Daleen Snook and meds were adjusted Pt has appointment with Dr Tamala Julian on [place] 08/13/21 at 2:20 pm .Adonis Housekeeper

## 2021-08-11 NOTE — Progress Notes (Signed)
Cardiology Office Note:    Date:  08/11/2021   ID:  Andre Miranda, DOB 24-May-1936, MRN [id number]  PCP:  Crist Infante, MD  Cardiologist:  Sinclair Grooms, MD   Referring MD: Crist Infante, MD   No chief complaint on file.   History of Present Illness:    Andre Miranda is a 85 y.o. male with a hx of  CAD with prior CABG(LIMA LAD, RIMA RCA, & SVG RI/OM in 2003, unstable angina treated with stent to SVG RI/OM 03/2021, aortic stenosis s/p TAVR 2016 at [facility], subsequent severe aortic regurgitation --> repeat TAVR prosthesis 10/12/2019, hyperlipidemia, prior difficult to control HTN, and chronic diastolic HF.  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  Acute on chronic diastolic heart failure resolved after repeat TAVR.  Acgina during 6- min walk rest in cardiac rehab.  Had hypotension and spironolactone was discontinued on 07/31/2021 because of mildly orthostatic blood pressures with a systolic of 95 while standing.  Recordings of blood pressures done in cardiac rehab were reviewed.  He not infrequently has blood pressures while standing that are in the 510 mmHg systolic range.  This is after spironolactone has been discontinued.  He had angina following a walking exercise in cardiac rehab.  This episode was no different than what he has had sporadically since the last stent was placed in May 2022.  He did not require nitroglycerin.  The angina has been variable threshold.  It does not occur at rest.  Past Medical History:  Diagnosis Date   Aortic stenosis    MODERATE   Cancer (HCC)    prostate cancer-radiation   Carotid arterial disease (HCC)    Coronary artery disease    Dysuria    GERD (gastroesophageal reflux disease)    Gout    Hematuria    History of urinary retention    Hyperlipidemia    Hypertension    Obesity    Sleep apnea    uses CPAP nightly    Past Surgical History:  Procedure Laterality Date   AORTIC VALVE REPAIR  02/06/2015   Dr.  Aline Brochure and Dr. Ysidro Evert  at [place]  09/26/2019   Procedure: BIOPSY;  Surgeon: Otis Brace, MD;  Location: [place];  Service: Gastroenterology;;   CAROTID ENDARTERECTOMY  02/16/2008   CHOLECYSTECTOMY  04/17/2002   COLONOSCOPY WITH PROPOFOL Left 09/26/2019   Procedure: COLONOSCOPY WITH PROPOFOL;  Surgeon: Otis Brace, MD;  Location: [place];  Service: Gastroenterology;  Laterality: Left;   CORONARY ARTERY BYPASS GRAFT  11/17/2001   LIMA TO THE LAD, RIMA TO THE RCA, AND A SAPHENOUS VEIN GRAFT TO THE INTERMEDIATE AND DISTAL LEFT CIRCUMFLEX   CORONARY STENT INTERVENTION N/A 04/03/2021   Procedure: CORONARY STENT INTERVENTION;  Surgeon: Belva Crome, MD;  Location: [place] [facility];  Service: Cardiovascular;  Laterality: N/A;   CORONARY/GRAFT ANGIOGRAPHY N/A 09/27/2019   Procedure: CORONARY/GRAFT ANGIOGRAPHY;  Surgeon: Belva Crome, MD;  Location: [place] [facility];  Service: Cardiovascular;  Laterality: N/A;   CORONARY/GRAFT ANGIOGRAPHY N/A 04/03/2021   Procedure: CORONARY/GRAFT ANGIOGRAPHY;  Surgeon: Belva Crome, MD;  Location: [place] [facility];  Service: Cardiovascular;  Laterality: N/A;   ESOPHAGOGASTRODUODENOSCOPY (EGD) WITH PROPOFOL Left 09/26/2019   Procedure: ESOPHAGOGASTRODUODENOSCOPY (EGD) WITH PROPOFOL;  Surgeon: Otis Brace, MD;  Location: [facility];  Service: Gastroenterology;  Laterality: Left;   INSERT / REPLACE / REMOVE PACEMAKER     ORIF ANKLE FRACTURE Right 02/14/2019  Procedure: OPEN REDUCTION INTERNAL FIXATION (ORIF) ANKLE FRACTURE;  Surgeon: Netta Cedars, MD;  Location: [place];  Service: Orthopedics;  Laterality: Right;   POLYPECTOMY  09/26/2019   Procedure: POLYPECTOMY;  Surgeon: Otis Brace, MD;  Location: [place] [facility];  Service: Gastroenterology;;   US ECHOCARDIOGRAPHY  08/17/2009   SHOWED A MEAN GRADIENT ACROSS IS AORTIC VALVE OF 24 MM OF MERCURY. HE HAD MODERATE LVH AND NORMAL LV FUNCTION     Current Medications: No outpatient medications have been marked as taking for the 08/13/21 encounter (Appointment) with Belva Crome, MD.     Allergies:   Ibuprofen   Social History   Socioeconomic History   Marital status: Married    Spouse name: Not on file   Number of children: 6   Years of education: 16   Highest education level: Bachelor's degree (e.g., BA, AB, BS)  Occupational History   Occupation: Retired  Tobacco Use   Smoking status: Former    Packs/day: 1.50    Years: 7.00    Pack years: 10.50    Types: Cigarettes    Quit date: 04/03/1971    Years since quitting: 50.3   Smokeless tobacco: Never  Vaping Use   Vaping Use: Never used  Substance and Sexual Activity   Alcohol use: Yes    Alcohol/week: 4.0 standard drinks    Types: 4 Cans of beer per week    Comment: social   Drug use: No   Sexual activity: Not on file  Other Topics Concern   Not on file  Social History Narrative   Not on file   Social Determinants of Health   Financial Resource Strain: Not on file  Food Insecurity: Not on file  Transportation Needs: Not on file  Physical Activity: Not on file  Stress: Not on file  Social Connections: Not on file     Family History: The patient's family history includes Heart attack in his father; Pneumonia (age of onset: 73) in his mother.  ROS:   Please see the history of present illness.    Concerned about who is following his carotids.  The last carotid Doppler demonstrated less than 39% in each common carotid artery.  Previously followed by vascular surgery.  After stopping all other systems reviewed and are negative.  EKGs/Labs/Other Studies Reviewed:    The following studies were reviewed today: Percutaneous coronary intervention Apr 03, 2021: Intervention    EKG:  EKG did not get repeated today  Recent Labs: 06/24/2021: BUN 28; Creatinine, Ser 1.03; Hemoglobin 14.0; Magnesium 2.3; Platelets 140; Potassium 4.1; Sodium 135  Recent  Lipid Panel    Component Value Date/Time   CHOL 90 04/23/2019 0249   TRIG 79 04/23/2019 0249   HDL 32 (L) 04/23/2019 0249   CHOLHDL 2.8 04/23/2019 0249   VLDL 16 04/23/2019 0249   LDLCALC 42 04/23/2019 0249    Physical Exam:    VS:  There were no vitals taken for this visit.    Wt Readings from Last 3 Encounters:  07/02/21 227 lb 15.3 oz (103.4 kg)  06/24/21 222 lb (100.7 kg)  04/22/21 224 lb (101.6 kg)     GEN: Compatible with age. No acute distress HEENT: Normal NECK: No JVD. LYMPHATICS: No lymphadenopathy CARDIAC: 3/6 systolic without diastolic murmur. RRR no gallop, or edema. VASCULAR:  Normal Pulses. No bruits. RESPIRATORY:  Clear to auscultation without rales, wheezing or rhonchi  ABDOMEN: Soft, non-tender, non-distended, No pulsatile mass, MUSCULOSKELETAL: No deformity  SKIN: Warm and  dry NEUROLOGIC:  Alert and oriented x 3 PSYCHIATRIC:  Normal affect   ASSESSMENT:    1. Angina pectoris ([place])   2. Coronary artery disease involving native coronary artery of native heart with unstable angina pectoris ([place])   3. Chronic diastolic heart failure (HCC)   4. Other hyperlipidemia   5. Bilateral carotid artery occlusion   6. Essential hypertension   7. Aortic valve insufficiency, etiology of cardiac valve disease unspecified    PLAN:    In order of problems listed above:  In a stable pattern likely related to first obtuse marginal which is dependent upon collaterals.  We will continue to treat symptomatically and follow clinically. No change in current therapy.  Continue preventive therapy. Gained 2 pounds after discontinuing spironolactone.  Weight has not further increased.  Spironolactone was discontinued because of relatively low blood pressures.  Continue spironolactone.  Basic metabolic panel today. Continue rosuvastatin 10 mg/day. Bilateral carotid Doppler will be ordered With a properly size blood pressure cuff, the pressure today while sitting was 130/80  mmHg.   Clinical observation.  Notify us of swelling, shortness of breath, increased angina.  He will have baseline angina pectoris going forward emanating from the circumflex territory.   Medication Adjustments/Labs and Tests Ordered: Current medicines are reviewed at length with the patient today.  Concerns regarding medicines are outlined above.  No orders of the defined types were placed in this encounter.  No orders of the defined types were placed in this encounter.   There are no Patient Instructions on file for this visit.   Signed, Sinclair Grooms, MD  08/11/2021 5:20 PM    [facility]

## 2021-08-12 ENCOUNTER — Encounter (HOSPITAL_COMMUNITY): Payer: Medicare Other

## 2021-08-13 ENCOUNTER — Encounter: Payer: Self-pay | Admitting: Interventional Cardiology

## 2021-08-13 ENCOUNTER — Other Ambulatory Visit: Payer: Self-pay

## 2021-08-13 ENCOUNTER — Ambulatory Visit (INDEPENDENT_AMBULATORY_CARE_PROVIDER_SITE_OTHER): Payer: Medicare Other | Admitting: Interventional Cardiology

## 2021-08-13 VITALS — BP 94/60 | HR 62 | Ht 68.75 in | Wt 230.2 lb

## 2021-08-13 DIAGNOSIS — I6523 Occlusion and stenosis of bilateral carotid arteries: Secondary | ICD-10-CM

## 2021-08-13 DIAGNOSIS — I5032 Chronic diastolic (congestive) heart failure: Secondary | ICD-10-CM

## 2021-08-13 DIAGNOSIS — I25110 Atherosclerotic heart disease of native coronary artery with unstable angina pectoris: Secondary | ICD-10-CM

## 2021-08-13 DIAGNOSIS — I10 Essential (primary) hypertension: Secondary | ICD-10-CM | POA: Diagnosis not present

## 2021-08-13 DIAGNOSIS — I209 Angina pectoris, unspecified: Secondary | ICD-10-CM

## 2021-08-13 DIAGNOSIS — I351 Nonrheumatic aortic (valve) insufficiency: Secondary | ICD-10-CM

## 2021-08-13 DIAGNOSIS — I779 Disorder of arteries and arterioles, unspecified: Secondary | ICD-10-CM | POA: Diagnosis not present

## 2021-08-13 DIAGNOSIS — E7849 Other hyperlipidemia: Secondary | ICD-10-CM

## 2021-08-13 DIAGNOSIS — I701 Atherosclerosis of renal artery: Secondary | ICD-10-CM

## 2021-08-13 NOTE — Patient Instructions (Signed)
Medication Instructions:  Your physician recommends that you continue on your current medications as directed. Please refer to the Current Medication list given to you today.  *If you need a refill on your cardiac medications before your next appointment, please call your pharmacy*   Lab Work: BMET today  If you have labs (blood work) drawn today and your tests are completely normal, you will receive your results only by: [place] (if you have MyChart) OR A paper copy in the mail If you have any lab test that is abnormal or we need to change your treatment, we will call you to review the results.   Testing/Procedures: Your physician has requested that you have a carotid duplex. This test is an ultrasound of the carotid arteries in your neck. It looks at blood flow through these arteries that supply the brain with blood. Allow one hour for this exam. There are no restrictions or special instructions.   Follow-Up: At [facility], you and your health needs are our priority.  As part of our continuing mission to provide you with exceptional heart care, we have created designated Provider Care Teams.  These Care Teams include your primary Cardiologist (physician) and Advanced Practice Providers (APPs -  Physician Assistants and Nurse Practitioners) who all work together to provide you with the care you need, when you need it.  We recommend signing up for the patient portal called "MyChart".  Sign up information is provided on this After Visit Summary.  MyChart is used to connect with patients for Virtual Visits (Telemedicine).  Patients are able to view lab/test results, encounter notes, upcoming appointments, etc.  Non-urgent messages can be sent to your provider as well.   To learn more about what you can do with MyChart, go to [email or web address].    Your next appointment:   4-6 month(s)  The format for your next appointment:   In Person  Provider:   You may see Sinclair Grooms, MD or one of the following Advanced Practice Providers on your designated Care Team:   Cecilie Kicks, NP   Other Instructions

## 2021-08-14 ENCOUNTER — Encounter (HOSPITAL_COMMUNITY)
Admission: RE | Admit: 2021-08-14 | Discharge: 2021-08-14 | Disposition: A | Discharge status: Home / Self Care | Payer: Medicare Other | Source: Ambulatory Visit | Attending: Interventional Cardiology | Admitting: Interventional Cardiology

## 2021-08-14 ENCOUNTER — Telehealth (HOSPITAL_COMMUNITY): Payer: Self-pay | Admitting: *Deleted

## 2021-08-14 DIAGNOSIS — Z955 Presence of coronary angioplasty implant and graft: Secondary | ICD-10-CM | POA: Diagnosis not present

## 2021-08-14 LAB — BASIC METABOLIC PANEL
BUN/Creatinine Ratio: 18 (ref 10–24)
BUN: 21 mg/dL (ref 8–27)
CO2: 23 mmol/L (ref 20–29)
Calcium: 9.2 mg/dL (ref 8.6–10.2)
Chloride: 103 mmol/L (ref 96–106)
Creatinine, Ser: 1.18 mg/dL (ref 0.76–1.27)
Glucose: 93 mg/dL (ref 70–99)
Potassium: 4.5 mmol/L (ref 3.5–5.2)
Sodium: 140 mmol/L (ref 134–144)
eGFR: 62 mL/min/{1.73_m2} (ref >59)

## 2021-08-14 NOTE — Telephone Encounter (Signed)
-----   Message from Belva Crome, MD sent at 08/14/2021  9:54 AM EDT ----- Regarding: RE: Return to Cardiac rehab Okay to resume in cardiac rehab.  He will have angina from time to time.  These usually resolve with rest. ----- Message ----- From: Lesly Rubenstein Sent: 08/13/2021   3:13 PM EDT To: Belva Crome, MD, Magda Kiel, RN Subject: Return to Cardiac rehab                        Good afternoon Dr. Tamala Julian,  Mr. Pilch was seen by you today in the office for stable angina. May patient resume his cardiac rehabilitation tomorrow?   Thank you,  Lesly Rubenstein MS, ACSM-CEP, CCRP

## 2021-08-16 ENCOUNTER — Other Ambulatory Visit: Payer: Self-pay

## 2021-08-16 ENCOUNTER — Encounter (HOSPITAL_COMMUNITY)
Admission: RE | Admit: 2021-08-16 | Discharge: 2021-08-16 | Disposition: A | Discharge status: Home / Self Care | Payer: Medicare Other | Source: Ambulatory Visit | Attending: Interventional Cardiology | Admitting: Interventional Cardiology

## 2021-08-16 DIAGNOSIS — I509 Heart failure, unspecified: Secondary | ICD-10-CM | POA: Diagnosis not present

## 2021-08-16 DIAGNOSIS — Z955 Presence of coronary angioplasty implant and graft: Secondary | ICD-10-CM

## 2021-08-16 DIAGNOSIS — E785 Hyperlipidemia, unspecified: Secondary | ICD-10-CM | POA: Diagnosis not present

## 2021-08-16 DIAGNOSIS — I110 Hypertensive heart disease with heart failure: Secondary | ICD-10-CM | POA: Diagnosis not present

## 2021-08-16 NOTE — Progress Notes (Signed)
Intermittent Wide complex complexes noted rate 70's today during exercise without pacing spikes. Patient asymptomatic. Blood pressure 118/70. Oxygen saturation 98% on room air. Rush Landmark said that he took his metoprolol 2 hours late other wise. Rush Landmark took his other medications as prescribed. Ria Comment reviewed the ECG tracings and said that they are paced beats. Bill left cardiac rehab without complaints.Will continue to monitor the patient throughout  the program. Barnet Pall, RN,BSN 08/16/2021 2:25 PM

## 2021-08-19 ENCOUNTER — Other Ambulatory Visit: Payer: Self-pay

## 2021-08-19 ENCOUNTER — Encounter (HOSPITAL_COMMUNITY)
Admission: RE | Admit: 2021-08-19 | Discharge: 2021-08-19 | Disposition: A | Discharge status: Home / Self Care | Payer: Medicare Other | Source: Ambulatory Visit | Attending: Interventional Cardiology | Admitting: Interventional Cardiology

## 2021-08-19 DIAGNOSIS — Z87891 Personal history of nicotine dependence: Secondary | ICD-10-CM | POA: Insufficient documentation

## 2021-08-19 DIAGNOSIS — Z955 Presence of coronary angioplasty implant and graft: Secondary | ICD-10-CM | POA: Insufficient documentation

## 2021-08-21 ENCOUNTER — Telehealth (HOSPITAL_COMMUNITY): Payer: Self-pay | Admitting: Internal Medicine

## 2021-08-21 ENCOUNTER — Encounter (HOSPITAL_COMMUNITY): Payer: Medicare Other

## 2021-08-23 ENCOUNTER — Other Ambulatory Visit: Payer: Self-pay

## 2021-08-23 ENCOUNTER — Encounter (HOSPITAL_COMMUNITY)
Admission: RE | Admit: 2021-08-23 | Discharge: 2021-08-23 | Disposition: A | Discharge status: Home / Self Care | Payer: Medicare Other | Source: Ambulatory Visit | Attending: Interventional Cardiology | Admitting: Interventional Cardiology

## 2021-08-23 DIAGNOSIS — Z87891 Personal history of nicotine dependence: Secondary | ICD-10-CM | POA: Diagnosis not present

## 2021-08-23 DIAGNOSIS — Z955 Presence of coronary angioplasty implant and graft: Secondary | ICD-10-CM | POA: Diagnosis not present

## 2021-08-24 ENCOUNTER — Other Ambulatory Visit: Payer: Self-pay | Admitting: Interventional Cardiology

## 2021-08-26 ENCOUNTER — Encounter (HOSPITAL_COMMUNITY)
Admission: RE | Admit: 2021-08-26 | Discharge: 2021-08-26 | Disposition: A | Discharge status: Home / Self Care | Payer: Medicare Other | Source: Ambulatory Visit | Attending: Interventional Cardiology | Admitting: Interventional Cardiology

## 2021-08-26 ENCOUNTER — Other Ambulatory Visit: Payer: Self-pay

## 2021-08-26 ENCOUNTER — Other Ambulatory Visit: Payer: Self-pay | Admitting: *Deleted

## 2021-08-26 DIAGNOSIS — Z955 Presence of coronary angioplasty implant and graft: Secondary | ICD-10-CM | POA: Diagnosis not present

## 2021-08-26 DIAGNOSIS — Z23 Encounter for immunization: Secondary | ICD-10-CM | POA: Diagnosis not present

## 2021-08-26 DIAGNOSIS — Z87891 Personal history of nicotine dependence: Secondary | ICD-10-CM | POA: Diagnosis not present

## 2021-08-26 NOTE — Telephone Encounter (Signed)
Stop taking spironolactone.  Let us know if you continue to gain weight or get short of breath.

## 2021-08-27 ENCOUNTER — Ambulatory Visit (HOSPITAL_COMMUNITY)
Admission: RE | Admit: 2021-08-27 | Discharge: 2021-08-27 | Disposition: A | Discharge status: Home / Self Care | Payer: Medicare Other | Source: Ambulatory Visit | Attending: Cardiology | Admitting: Cardiology

## 2021-08-27 DIAGNOSIS — I6522 Occlusion and stenosis of left carotid artery: Secondary | ICD-10-CM | POA: Diagnosis not present

## 2021-08-27 DIAGNOSIS — I779 Disorder of arteries and arterioles, unspecified: Secondary | ICD-10-CM | POA: Diagnosis not present

## 2021-08-27 NOTE — Progress Notes (Signed)
Cardiac Individual Treatment Plan  Patient Details  Name: Andre Miranda MRN: [id number] Date of Birth: 1936/08/06 Referring Provider:   Flowsheet Row CARDIAC REHAB PHASE II ORIENTATION from 07/02/2021 in [facility]  Referring Provider Daneen Schick, MD       Initial Encounter Date:  [place] PHASE II ORIENTATION from 07/02/2021 in [facility]  Date 07/02/21       Visit Diagnosis: 04/03/21 S/P DES SVG   Patient's Home Medications on Admission:  Current Outpatient Medications:    acetaminophen (TYLENOL) 500 MG tablet, Take 500 mg by mouth every 6 (six) hours as needed for moderate pain., Disp: , Rfl:    allopurinol (ZYLOPRIM) 300 MG tablet, Take 300 mg by mouth daily., Disp: , Rfl:    amLODipine (NORVASC) 5 MG tablet, Take 1 tablet by mouth once daily, Disp: 90 tablet, Rfl: 3   aspirin EC 81 MG tablet, Take 81 mg by mouth at bedtime. , Disp: , Rfl:    azithromycin (ZITHROMAX) 500 MG tablet, Take 500 mg by mouth See admin instructions. Take 500 mg 1 hour prior to dental work, Disp: , Rfl:    Cholecalciferol (VITAMIN D3) 1000 UNITS CAPS, Take 1,000 Units by mouth at bedtime., Disp: , Rfl:    clopidogrel (PLAVIX) 75 MG tablet, Take 1 tablet (75 mg total) by mouth daily with breakfast., Disp: 90 tablet, Rfl: 3   famotidine (PEPCID) 20 MG tablet, Take 20 mg by mouth daily before breakfast., Disp: , Rfl:    furosemide (LASIX) 40 MG tablet, TAKE 1 TABLET BY MOUTH EVERY DAY, Disp: 90 tablet, Rfl: 3   isosorbide mononitrate (IMDUR) 60 MG 24 hr tablet, Take 1.5 tablets (90 mg total) by mouth daily., Disp: 45 tablet, Rfl: 11   metoprolol tartrate (LOPRESSOR) 25 MG tablet, Take 1 tablet (25 mg total) by mouth 2 (two) times daily., Disp: 180 tablet, Rfl: 3   miconazole (ZEASORB-AF) 2 % powder, Apply 1 application topically as needed for itching., Disp: , Rfl:    Multiple Vitamins-Minerals (CENTRUM SILVER) tablet,  Take 1 tablet by mouth daily with breakfast., Disp: , Rfl:    nitroGLYCERIN (NITROSTAT) 0.4 MG SL tablet, Place 1 tablet (0.4 mg total) under the tongue every 5 (five) minutes as needed for chest pain., Disp: 25 tablet, Rfl: 3   polyethylene glycol powder (GLYCOLAX/MIRALAX) 17 GM/SCOOP powder, Take 17 g by mouth daily as needed for moderate constipation., Disp: , Rfl:    PRESCRIPTION MEDICATION, CPAP: At bedtime, Disp: , Rfl:    ramipril (ALTACE) 5 MG capsule, Take 1 capsule (5 mg total) by mouth at bedtime., Disp: 90 capsule, Rfl: 3   rosuvastatin (CRESTOR) 10 MG tablet, Take 10 mg by mouth every evening., Disp: , Rfl:    Tamsulosin HCl (FLOMAX) 0.4 MG CAPS, Take 0.4 mg by mouth every evening., Disp: , Rfl:    temazepam (RESTORIL) 15 MG capsule, Take 15-30 mg by mouth at bedtime as needed for sleep., Disp: , Rfl:   Past Medical History: Past Medical History:  Diagnosis Date   Aortic stenosis    MODERATE   Cancer ([place])    prostate cancer-radiation   Carotid arterial disease ([place])    Coronary artery disease    Dysuria    GERD (gastroesophageal reflux disease)    Gout    Hematuria    History of urinary retention    Hyperlipidemia    Hypertension    Obesity  Sleep apnea    uses CPAP nightly    Tobacco Use: Social History   Tobacco Use  Smoking Status Former   Packs/day: 1.50   Years: 7.00   Pack years: 10.50   Types: Cigarettes   Quit date: 04/03/1971   Years since quitting: 50.4  Smokeless Tobacco Never    Labs: Recent Review Flowsheet Data     Labs for ITP Cardiac and Pulmonary Rehab Latest Ref Rng & Units 11/29/2014 04/22/2019 04/23/2019   Cholestrol 0 - 200 mg/dL 109 - 90   LDLCALC 0 - 99 mg/dL 46 - 42   HDL >40 mg/dL 42.40 - 32(L)   Trlycerides <150 mg/dL 104.0 - 79   Hemoglobin A1c 4.8 - 5.6 % - 5.4 -       Capillary Blood Glucose: Lab Results  Component Value Date   GLUCAP 134 (H) 09/10/2019     Exercise Target Goals: Exercise Program Goal: Individual  exercise prescription set using results from initial 6 min walk test and THRR while considering  patient's activity barriers and safety.   Exercise Prescription Goal: Initial exercise prescription builds to 30-45 minutes a day of aerobic activity, 2-3 days per week.  Home exercise guidelines will be given to patient during program as part of exercise prescription that the participant will acknowledge.  Activity Barriers & Risk Stratification:  Activity Barriers & Cardiac Risk Stratification - 07/02/21 1045       Activity Barriers & Cardiac Risk Stratification   Activity Barriers Chest Pain/Angina;Balance Concerns;Arthritis    Cardiac Risk Stratification High             6 Minute Walk:  6 Minute Walk     Row Name 07/02/21 0953         6 Minute Walk   Phase Initial     Distance 1314 feet     Walk Time 6 minutes     # of Rest Breaks 0     MPH 2.49     METS 2.07     RPE 13     Perceived Dyspnea  0     VO2 Peak 7.26     Symptoms Yes (comment)     Comments Chest pain (central chest/sternum) 2/10 resolved within 2 minutes. No other symptoms     Resting HR 54 bpm     Resting BP 120/68     Resting Oxygen Saturation  98 %     Exercise Oxygen Saturation  during 6 min walk 98 %     Max Ex. HR 111 bpm     Max Ex. BP 138/70     2 Minute Post BP 122/70              Oxygen Initial Assessment:   Oxygen Re-Evaluation:   Oxygen Discharge (Final Oxygen Re-Evaluation):   Initial Exercise Prescription:  Initial Exercise Prescription - 07/02/21 1000       Date of Initial Exercise RX and Referring Provider   Date 07/02/21    Referring Provider Daneen Schick, MD    Expected Discharge Date 08/30/21      NuStep   Level 2    SPM 75    Minutes 30    METs 2      Prescription Details   Frequency (times per week) 3    Duration Progress to 30 minutes of continuous aerobic without signs/symptoms of physical distress      Intensity   THRR 40-80% of Max Heartrate 55-110  Ratings of Perceived Exertion 11-13    Perceived Dyspnea 0-4      Progression   Progression Continue progressive overload as per policy without signs/symptoms or physical distress.      Resistance Training   Training Prescription Yes    Weight 3 lbs    Reps 10-15             Perform Capillary Blood Glucose checks as needed.  Exercise Prescription Changes:   Exercise Prescription Changes     Row Name 07/08/21 1500 07/26/21 1600 08/07/21 1500 08/26/21 1500       Response to Exercise   Blood Pressure (Admit) 1_0 92/54    Blood Pressure (Exercise) 118/66 110/72 114/64 112/60    Blood Pressure (Exit) 102/27 98/58 -- 108/56    Heart Rate (Admit) 63 bpm 69 bpm 48 bpm 75 bpm    Heart Rate (Exercise) 90 bpm 82 bpm 94 bpm 108 bpm    Heart Rate (Exit) 63 bpm 63 bpm -- 80 bpm    Rating of Perceived Exertion (Exercise) _1 Symptoms None None None None    Comments Pt's first day in the CRP2 program Reviewed METs and Home exercise Rx Reviewed METs and Goals Reviewed METs    Duration Continue with 30 min of aerobic exercise without signs/symptoms of physical distress. Continue with 30 min of aerobic exercise without signs/symptoms of physical distress. Continue with 30 min of aerobic exercise without signs/symptoms of physical distress. Progress to 30 minutes of  aerobic without signs/symptoms of physical distress    Intensity THRR unchanged THRR unchanged THRR unchanged THRR unchanged         Progression   Progression Continue to progress workloads to maintain intensity without signs/symptoms of physical distress. Continue to progress workloads to maintain intensity without signs/symptoms of physical distress. Continue to progress workloads to maintain intensity without signs/symptoms of physical distress. Continue to progress workloads to maintain intensity without signs/symptoms of physical distress.    Average METs 2.1 2.5 3.1 3.4         Resistance Training    Training Prescription Yes Yes No No    Weight 3 lbs 3 lbs No weights on Wednesdays 3 lbs    Reps 10-15 10-15 -- 10-15    Time 10 Minutes 10 Minutes -- 10 Minutes         Interval Training   Interval Training No No No No         NuStep   Level _2 SPM 80 85 95 100    Minutes _3 METs 2.1 2.5 3.1 3.4         Home Exercise Plan   Plans to continue exercise at -- Home (comment) Home (comment) Home (comment)    Frequency -- Add 2 additional days to program exercise sessions. Add 2 additional days to program exercise sessions. Add 2 additional days to program exercise sessions.    Initial Home Exercises Provided -- 07/26/21 07/26/21 07/26/21             Exercise Comments:   Exercise Comments     Row Name 07/08/21 1532 07/26/21 1625 08/07/21 1500 08/26/21 1542     Exercise Comments Pt's first day in the [place] program. Pt is off to a good start. He tolerated todays session well. Reviewed home exercise Rx with patient today. Pt verbalized understanding of the home exercise Rx and was  provided a copy. Reviewed METs and goals. Pt is making progress in his MET level and voices feeling increased strength and stamina which was one of his goals. Reviewed METs. Pt making good progress and has not experienced any other episodes of angina with exercise. Pt continues to walk at home 2-3x/week in addtion to the CRP2 program.             Exercise Goals and Review:   Exercise Goals     Row Name 07/02/21 1046             Exercise Goals   Increase Physical Activity Yes       Intervention Provide advice, education, support and counseling about physical activity/exercise needs.;Develop an individualized exercise prescription for aerobic and resistive training based on initial evaluation findings, risk stratification, comorbidities and participant's personal goals.       Expected Outcomes Short Term: Attend rehab on a regular basis to increase amount of physical  activity.;Long Term: Add in home exercise to make exercise part of routine and to increase amount of physical activity.;Long Term: Exercising regularly at least 3-5 days a week.       Increase Strength and Stamina Yes       Intervention Provide advice, education, support and counseling about physical activity/exercise needs.;Develop an individualized exercise prescription for aerobic and resistive training based on initial evaluation findings, risk stratification, comorbidities and participant's personal goals.       Expected Outcomes Short Term: Increase workloads from initial exercise prescription for resistance, speed, and METs.;Short Term: Perform resistance training exercises routinely during rehab and add in resistance training at home;Long Term: Improve cardiorespiratory fitness, muscular endurance and strength as measured by increased METs and functional capacity (6MWT)       Able to understand and use rate of perceived exertion (RPE) scale Yes       Intervention Provide education and explanation on how to use RPE scale       Expected Outcomes Short Term: Able to use RPE daily in rehab to express subjective intensity level;Long Term:  Able to use RPE to guide intensity level when exercising independently       Knowledge and understanding of Target Heart Rate Range (THRR) Yes       Intervention Provide education and explanation of THRR including how the numbers were predicted and where they are located for reference       Expected Outcomes Short Term: Able to state/look up THRR;Long Term: Able to use THRR to govern intensity when exercising independently;Short Term: Able to use daily as guideline for intensity in rehab       Understanding of Exercise Prescription Yes       Intervention Provide education, explanation, and written materials on patient's individual exercise prescription       Expected Outcomes Short Term: Able to explain program exercise prescription;Long Term: Able to explain home  exercise prescription to exercise independently                Exercise Goals Re-Evaluation :  Exercise Goals Re-Evaluation     Row Name 07/08/21 1530 07/26/21 1622 08/07/21 1500 08/09/21 0833       Exercise Goal Re-Evaluation   Exercise Goals Review Increase Physical Activity;Increase Strength and Stamina;Able to understand and use rate of perceived exertion (RPE) scale;Knowledge and understanding of Target Heart Rate Range (THRR);Understanding of Exercise Prescription Increase Physical Activity;Increase Strength and Stamina;Able to understand and use rate of perceived exertion (RPE) scale;Knowledge and understanding of Target Heart  Rate Range (THRR);Able to check pulse independently;Understanding of Exercise Prescription Increase Physical Activity;Increase Strength and Stamina;Able to understand and use rate of perceived exertion (RPE) scale;Knowledge and understanding of Target Heart Rate Range (THRR);Able to check pulse independently;Understanding of Exercise Prescription --    Comments Pt's first day in the CRP2 program. Pt understands the exercise Rx, THRR, and RPE scale. Reviewed METs and home exercise Rx. Pt is not currently walking at home but willing to start walking at home 2x/week for 30 minutes in addtion to his CRP2 program. Reviewed METs and Goals. Pt is progressing in his goal of more strength and stamina. Pt voices he feels better since beginning the CRP2 program and is also walking at home 1-2 x week for 35 minutes. Encouraged walking 2-3x/week. --    Expected Outcomes Will continue to monitor patient and progress exercise workloads as tolerated. Pt will walk at home 2 x/week. Will continue to monitor progress and advance workloads as tolerated. Marland Kitchen             Discharge Exercise Prescription (Final Exercise Prescription Changes):  Exercise Prescription Changes - 08/26/21 1500       Response to Exercise   Blood Pressure (Admit) 92/54    Blood Pressure (Exercise) 112/60     Blood Pressure (Exit) 108/56    Heart Rate (Admit) 75 bpm    Heart Rate (Exercise) 108 bpm    Heart Rate (Exit) 80 bpm    Rating of Perceived Exertion (Exercise) 11    Symptoms None    Comments Reviewed METs    Duration Progress to 30 minutes of  aerobic without signs/symptoms of physical distress    Intensity THRR unchanged      Progression   Progression Continue to progress workloads to maintain intensity without signs/symptoms of physical distress.    Average METs 3.4      Resistance Training   Training Prescription No    Weight 3 lbs    Reps 10-15    Time 10 Minutes      Interval Training   Interval Training No      NuStep   Level 4    SPM 100    Minutes 30    METs 3.4      Home Exercise Plan   Plans to continue exercise at Home (comment)    Frequency Add 2 additional days to program exercise sessions.    Initial Home Exercises Provided 07/26/21             Nutrition:  Target Goals: Understanding of nutrition guidelines, daily intake of sodium <1552m, cholesterol <2045m calories 30% from fat and 7% or less from saturated fats, daily to have 5 or more servings of fruits and vegetables.  Biometrics:  Pre Biometrics - 07/02/21 0920       Pre Biometrics   Waist Circumference 46.25 inches    Hip Circumference 45.5 inches    Waist to Hip Ratio 1.02 %    Triceps Skinfold 21 mm    % Body Fat 34.6 %    Grip Strength 35 kg    Flexibility 12 in    Single Leg Stand 4.68 seconds              Nutrition Therapy Plan and Nutrition Goals:  Nutrition Therapy & Goals - 07/11/21 0829       Nutrition Therapy   Diet TLC    Drug/Food Interactions Statins/Certain Fruits      Personal Nutrition Goals   Nutrition Goal  Pt to avoid butter    Personal Goal #2 Pt to cut down on meats high in saturated fat.    Personal Goal #3 Pt to eat 3 servings whole grains daily      Intervention Plan   Intervention Prescribe, educate and counsel regarding individualized  specific dietary modifications aiming towards targeted core components such as weight, hypertension, lipid management, diabetes, heart failure and other comorbidities.;Nutrition handout(s) given to patient.    Expected Outcomes Long Term Goal: Adherence to prescribed nutrition plan.;Short Term Goal: A plan has been developed with personal nutrition goals set during dietitian appointment.             Nutrition Assessments:  MEDIFICTS Score Key: ?70 Need to make dietary changes  40-70 Heart Healthy Diet ? 40 Therapeutic Level Cholesterol Diet   Flowsheet Row CARDIAC REHAB PHASE II EXERCISE from 07/10/2021 in [facility]  Picture Your Plate Total Score on Admission 58      Picture Your Plate Scores: <37 Unhealthy dietary pattern with much room for improvement. 41-50 Dietary pattern unlikely to meet recommendations for good health and room for improvement. 51-60 More healthful dietary pattern, with some room for improvement.  >60 Healthy dietary pattern, although there may be some specific behaviors that could be improved.    Nutrition Goals Re-Evaluation:  Nutrition Goals Re-Evaluation     [place] Name 07/11/21 0830 07/29/21 3428           Goals   Current Weight 227 lb (103 kg) 223 lb 15.8 oz (101.6 kg)      Nutrition Goal Pt to avoid butter Pt to avoid butter             Personal Goal #2 Re-Evaluation   Personal Goal #2 Pt to cut down on meats high in saturated fat. Pt to cut down on meats high in saturated fat.             Personal Goal #3 Re-Evaluation   Personal Goal #3 Pt to eat 3 servings whole grains daily Pt to eat 3 servings whole grains daily               Nutrition Goals Re-Evaluation:  Nutrition Goals Re-Evaluation     [place] Name 07/11/21 0830 07/29/21 7681           Goals   Current Weight 227 lb (103 kg) 223 lb 15.8 oz (101.6 kg)      Nutrition Goal Pt to avoid butter Pt to avoid butter             Personal Goal #2  Re-Evaluation   Personal Goal #2 Pt to cut down on meats high in saturated fat. Pt to cut down on meats high in saturated fat.             Personal Goal #3 Re-Evaluation   Personal Goal #3 Pt to eat 3 servings whole grains daily Pt to eat 3 servings whole grains daily               Nutrition Goals Discharge (Final Nutrition Goals Re-Evaluation):  Nutrition Goals Re-Evaluation - 07/29/21 0952       Goals   Current Weight 223 lb 15.8 oz (101.6 kg)    Nutrition Goal Pt to avoid butter      Personal Goal #2 Re-Evaluation   Personal Goal #2 Pt to cut down on meats high in saturated fat.      Personal Goal #3 Re-Evaluation  Personal Goal #3 Pt to eat 3 servings whole grains daily             Psychosocial: Target Goals: Acknowledge presence or absence of significant depression and/or stress, maximize coping skills, provide positive support system. Participant is able to verbalize types and ability to use techniques and skills needed for reducing stress and depression.  Initial Review & Psychosocial Screening:  Initial Psych Review & Screening - 07/02/21 1042       Initial Review   Current issues with None Identified      Family Dynamics   Good Support System? Yes   Andre Miranda has his wife children and grandchildren for support.     Barriers   Psychosocial barriers to participate in program The patient should benefit from training in stress management and relaxation.      Screening Interventions   Interventions Encouraged to exercise             Quality of Life Scores:  Quality of Life - 07/02/21 1125       Quality of Life   Select Quality of Life      Quality of Life Scores   Health/Function Pre 22.32 %    Socioeconomic Pre 24.79 %    Psych/Spiritual Pre 23.79 %    Family Pre 24 %    GLOBAL Pre 23.41 %            Scores of 19 and below usually indicate a poorer quality of life in these areas.  A difference of  2-3 points is a clinically meaningful  difference.  A difference of 2-3 points in the total score of the Quality of Life Index has been associated with significant improvement in overall quality of life, self-image, physical symptoms, and general health in studies assessing change in quality of life.  PHQ-9: Recent Review Flowsheet Data     Depression screen [facility] 2/9 07/02/2021 07/02/2021 01/11/2016 09/12/2015   Decreased Interest 0 0 0 0   Down, Depressed, Hopeless 0 0 0 0   PHQ - 2 Score 0 0 0 0      Interpretation of Total Score  Total Score Depression Severity:  1-4 = Minimal depression, 5-9 = Mild depression, 10-14 = Moderate depression, 15-19 = Moderately severe depression, 20-27 = Severe depression   Psychosocial Evaluation and Intervention:   Psychosocial Re-Evaluation:  Psychosocial Re-Evaluation     [place] Name 07/08/21 1636 07/30/21 1444 08/23/21 1707         Psychosocial Re-Evaluation   Current issues with None Identified None Identified None Identified     Interventions Encouraged to attend Cardiac Rehabilitation for the exercise Encouraged to attend Cardiac Rehabilitation for the exercise Encouraged to attend Cardiac Rehabilitation for the exercise     Continue Psychosocial Services  No Follow up required No Follow up required No Follow up required              Psychosocial Discharge (Final Psychosocial Re-Evaluation):  Psychosocial Re-Evaluation - 08/23/21 1707       Psychosocial Re-Evaluation   Current issues with None Identified    Interventions Encouraged to attend Cardiac Rehabilitation for the exercise    Continue Psychosocial Services  No Follow up required             Vocational Rehabilitation: Provide vocational rehab assistance to qualifying candidates.   Vocational Rehab Evaluation & Intervention:  Vocational Rehab - 07/02/21 1044       Initial Vocational Rehab Evaluation & Intervention   Assessment  shows need for Vocational Rehabilitation No   Andre Miranda is retired and does not need  vocational rehab at this time            Education: Education Goals: Education classes will be provided on a weekly basis, covering required topics. Participant will state understanding/return demonstration of topics presented.  Learning Barriers/Preferences:  Learning Barriers/Preferences - 07/02/21 1112       Learning Barriers/Preferences   Learning Barriers Sight;Hearing   wears reading glasses, HOH   Learning Preferences Audio;Computer/Internet;Group Instruction;Individual Instruction;Pictoral;Skilled Demonstration;Verbal Instruction;Video;Written Material             Education Topics: Count Your Pulse:  -Group instruction provided by verbal instruction, demonstration, patient participation and written materials to support subject.  Instructors address importance of being able to find your pulse and how to count your pulse when at home without a heart monitor.  Patients get hands on experience counting their pulse with staff help and individually.   Heart Attack, Angina, and Risk Factor Modification:  -Group instruction provided by verbal instruction, video, and written materials to support subject.  Instructors address signs and symptoms of angina and heart attacks.    Also discuss risk factors for heart disease and how to make changes to improve heart health risk factors.   Functional Fitness:  -Group instruction provided by verbal instruction, demonstration, patient participation, and written materials to support subject.  Instructors address safety measures for doing things around the house.  Discuss how to get up and down off the floor, how to pick things up properly, how to safely get out of a chair without assistance, and balance training.   Meditation and Mindfulness:  -Group instruction provided by verbal instruction, patient participation, and written materials to support subject.  Instructor addresses importance of mindfulness and meditation practice to help  reduce stress and improve awareness.  Instructor also leads participants through a meditation exercise.    Stretching for Flexibility and Mobility:  -Group instruction provided by verbal instruction, patient participation, and written materials to support subject.  Instructors lead participants through series of stretches that are designed to increase flexibility thus improving mobility.  These stretches are additional exercise for major muscle groups that are typically performed during regular warm up and cool down.   Hands Only CPR:  -Group verbal, video, and participation provides a basic overview of AHA guidelines for community CPR. Role-play of emergencies allow participants the opportunity to practice calling for help and chest compression technique with discussion of AED use.   Hypertension: -Group verbal and written instruction that provides a basic overview of hypertension including the most recent diagnostic guidelines, risk factor reduction with self-care instructions and medication management.    Nutrition I class: Heart Healthy Eating:  -Group instruction provided by PowerPoint slides, verbal discussion, and written materials to support subject matter. The instructor gives an explanation and review of the Therapeutic Lifestyle Changes diet recommendations, which includes a discussion on lipid goals, dietary fat, sodium, fiber, plant stanol/sterol esters, sugar, and the components of a well-balanced, healthy diet.   Nutrition II class: Lifestyle Skills:  -Group instruction provided by PowerPoint slides, verbal discussion, and written materials to support subject matter. The instructor gives an explanation and review of label reading, grocery shopping for heart health, heart healthy recipe modifications, and ways to make healthier choices when eating out.   Diabetes Question & Answer:  -Group instruction provided by PowerPoint slides, verbal discussion, and written materials to  support subject matter. The instructor gives an explanation  and review of diabetes co-morbidities, pre- and post-prandial blood glucose goals, pre-exercise blood glucose goals, signs, symptoms, and treatment of hypoglycemia and hyperglycemia, and foot care basics.   Diabetes Blitz:  -Group instruction provided by PowerPoint slides, verbal discussion, and written materials to support subject matter. The instructor gives an explanation and review of the physiology behind type 1 and type 2 diabetes, diabetes medications and rational behind using different medications, pre- and post-prandial blood glucose recommendations and Hemoglobin A1c goals, diabetes diet, and exercise including blood glucose guidelines for exercising safely.    Portion Distortion:  -Group instruction provided by PowerPoint slides, verbal discussion, written materials, and food models to support subject matter. The instructor gives an explanation of serving size versus portion size, changes in portions sizes over the last 20 years, and what consists of a serving from each food group.   Stress Management:  -Group instruction provided by verbal instruction, video, and written materials to support subject matter.  Instructors review role of stress in heart disease and how to cope with stress positively.     Exercising on Your Own:  -Group instruction provided by verbal instruction, power point, and written materials to support subject.  Instructors discuss benefits of exercise, components of exercise, frequency and intensity of exercise, and end points for exercise.  Also discuss use of nitroglycerin and activating EMS.  Review options of places to exercise outside of rehab.  Review guidelines for sex with heart disease.   Cardiac Drugs I:  -Group instruction provided by verbal instruction and written materials to support subject.  Instructor reviews cardiac drug classes: antiplatelets, anticoagulants, beta blockers, and statins.   Instructor discusses reasons, side effects, and lifestyle considerations for each drug class.   Cardiac Drugs II:  -Group instruction provided by verbal instruction and written materials to support subject.  Instructor reviews cardiac drug classes: angiotensin converting enzyme inhibitors (ACE-I), angiotensin II receptor blockers (ARBs), nitrates, and calcium channel blockers.  Instructor discusses reasons, side effects, and lifestyle considerations for each drug class.   Anatomy and Physiology of the Circulatory System:  Group verbal and written instruction and models provide basic cardiac anatomy and physiology, with the coronary electrical and arterial systems. Review of: AMI, Angina, Valve disease, Heart Failure, Peripheral Artery Disease, Cardiac Arrhythmia, Pacemakers, and the ICD.   Other Education:  -Group or individual verbal, written, or video instructions that support the educational goals of the cardiac rehab program.   Holiday Eating Survival Tips:  -Group instruction provided by PowerPoint slides, verbal discussion, and written materials to support subject matter. The instructor gives patients tips, tricks, and techniques to help them not only survive but enjoy the holidays despite the onslaught of food that accompanies the holidays.   Knowledge Questionnaire Score:  Knowledge Questionnaire Score - 07/02/21 1126       Knowledge Questionnaire Score   Pre Score 21/24             Core Components/Risk Factors/Patient Goals at Admission:  Personal Goals and Risk Factors at Admission - 07/02/21 1126       Core Components/Risk Factors/Patient Goals on Admission    Weight Management Yes;Obesity;Weight Loss    Intervention Weight Management: Develop a combined nutrition and exercise program designed to reach desired caloric intake, while maintaining appropriate intake of nutrient and fiber, sodium and fats, and appropriate energy expenditure required for the weight  goal.;Weight Management: Provide education and appropriate resources to help participant work on and attain dietary goals.;Weight Management/Obesity: Establish reasonable short term and  long term weight goals.;Obesity: Provide education and appropriate resources to help participant work on and attain dietary goals.    Admit Weight 227 lb 15.3 oz (103.4 kg)    Expected Outcomes Short Term: Continue to assess and modify interventions until short term weight is achieved;Long Term: Adherence to nutrition and physical activity/exercise program aimed toward attainment of established weight goal;Weight Maintenance: Understanding of the daily nutrition guidelines, which includes 25-35% calories from fat, 7% or less cal from saturated fats, less than 261m cholesterol, less than 1.5gm of sodium, & 5 or more servings of fruits and vegetables daily;Weight Loss: Understanding of general recommendations for a balanced deficit meal plan, which promotes 1-2 lb weight loss per week and includes a negative energy balance of [phone] kcal/d;Understanding recommendations for meals to include 15-35% energy as protein, 25-35% energy from fat, 35-60% energy from carbohydrates, less than 2012mof dietary cholesterol, 20-35 gm of total fiber daily;Understanding of distribution of calorie intake throughout the day with the consumption of 4-5 meals/snacks    Hypertension Yes    Intervention Provide education on lifestyle modifcations including regular physical activity/exercise, weight management, moderate sodium restriction and increased consumption of fresh fruit, vegetables, and low fat dairy, alcohol moderation, and smoking cessation.;Monitor prescription use compliance.    Expected Outcomes Short Term: Continued assessment and intervention until BP is < 140/9024mG in hypertensive participants. < 130/70m57m in hypertensive participants with diabetes, heart failure or chronic kidney disease.;Long Term: Maintenance of blood pressure  at goal levels.    Lipids Yes    Intervention Provide education and support for participant on nutrition & aerobic/resistive exercise along with prescribed medications to achieve LDL <70mg28mL >40mg.22mExpected Outcomes Short Term: Participant states understanding of desired cholesterol values and is compliant with medications prescribed. Participant is following exercise prescription and nutrition guidelines.;Long Term: Cholesterol controlled with medications as prescribed, with individualized exercise RX and with personalized nutrition plan. Value goals: LDL < 70mg, 72m> 40 mg.             Core Components/Risk Factors/Patient Goals Review:   Goals and Risk Factor Review     Row Name 07/08/21 1637 07/30/21 1444 08/23/21 1707         Core Components/Risk Factors/Patient Goals Review   Personal Goals Review Weight Management/Obesity;Hypertension;Lipids Weight Management/Obesity;Hypertension;Lipids Weight Management/Obesity;Hypertension;Lipids     Review Bill started exercise at cardiac rehab on 07/08/21 and did well with exercise for his fitness level. VSS Bill continues to do well with exercise at phase 2 cardiac rehab. Resting systolic BP's have been in the 90's will continue to monitor BP. Patient asymptomatic. Bill [facility] [place] to do well with exercise at phase 2 cardiac rehab. Resting systolic BP's have been in the 90's at times. will continue to monitor BP. Patient asymptomatic. Bill [facility]markomplete phase 2 cardiac rehab on 08/23/21.     Expected Outcomes Bill will contionue to participate in phase 2 cardiac rehab for exercise, nutrition and lifestyle modifications Bill will contionue to participate in phase 2 cardiac rehab for exercise, nutrition and lifestyle modifications Bill will contionue to participate in phase 2 cardiac rehab for exercise, nutrition and lifestyle modifications              Core Components/Risk Factors/Patient Goals at Discharge (Final Review):   Goals and Risk  Factor Review - 08/23/21 1707       Core Components/Risk Factors/Patient Goals Review   Personal Goals Review Weight Management/Obesity;Hypertension;Lipids    Review Bill [facility] [place] to  do well with exercise at phase 2 cardiac rehab. Resting systolic BP's have been in the 90's at times. will continue to monitor BP. Patient asymptomatic. Andre Miranda will complete phase 2 cardiac rehab on 08/23/21.    Expected Outcomes Bill will contionue to participate in phase 2 cardiac rehab for exercise, nutrition and lifestyle modifications             ITP Comments:  ITP Comments     Row Name 07/02/21 1042 07/08/21 1633 07/30/21 1442 08/23/21 1703     ITP Comments Dr Fransico Him MD, Medical Director. 30 Day ITP Review. Bill started exercise at cardiac rehab on 07/08/21 and did well with exercise for his fitness level. 30 Day ITP Review. Andre Miranda has good attendance and participation in phase 2 cardiac rehab. 30 Day ITP Review. Andre Miranda has good attendance and participation in phase 2 cardiac rehab. Andre Miranda will complete phase 2 cardiac rehab on 08/30/21             Comments: See ITP comments

## 2021-08-28 ENCOUNTER — Other Ambulatory Visit: Payer: Self-pay

## 2021-08-28 ENCOUNTER — Encounter (HOSPITAL_COMMUNITY)
Admission: RE | Admit: 2021-08-28 | Discharge: 2021-08-28 | Disposition: A | Discharge status: Home / Self Care | Payer: Medicare Other | Source: Ambulatory Visit | Attending: Interventional Cardiology | Admitting: Interventional Cardiology

## 2021-08-28 VITALS — Ht 68.75 in | Wt 228.2 lb

## 2021-08-28 DIAGNOSIS — Z87891 Personal history of nicotine dependence: Secondary | ICD-10-CM | POA: Diagnosis not present

## 2021-08-28 DIAGNOSIS — Z955 Presence of coronary angioplasty implant and graft: Secondary | ICD-10-CM

## 2021-08-30 ENCOUNTER — Other Ambulatory Visit: Payer: Self-pay

## 2021-08-30 ENCOUNTER — Encounter (HOSPITAL_COMMUNITY)
Admission: RE | Admit: 2021-08-30 | Discharge: 2021-08-30 | Disposition: A | Discharge status: Home / Self Care | Payer: Medicare Other | Source: Ambulatory Visit | Attending: Interventional Cardiology | Admitting: Interventional Cardiology

## 2021-08-30 DIAGNOSIS — Z955 Presence of coronary angioplasty implant and graft: Secondary | ICD-10-CM | POA: Diagnosis not present

## 2021-08-30 DIAGNOSIS — Z87891 Personal history of nicotine dependence: Secondary | ICD-10-CM | POA: Diagnosis not present

## 2021-09-03 DIAGNOSIS — Z23 Encounter for immunization: Secondary | ICD-10-CM | POA: Diagnosis not present

## 2021-09-06 DIAGNOSIS — Z950 Presence of cardiac pacemaker: Secondary | ICD-10-CM | POA: Diagnosis not present

## 2021-09-06 IMAGING — DX DG CHEST 2V
2 series · 2 of 2 positions shown · non-contrast
Comparison: Portable chest 09/23/2019 and earlier.

CLINICAL DATA: 84-year-old male with chest pain and bradycardia.

EXAM:
CHEST - 2 VIEW

[chest pa]
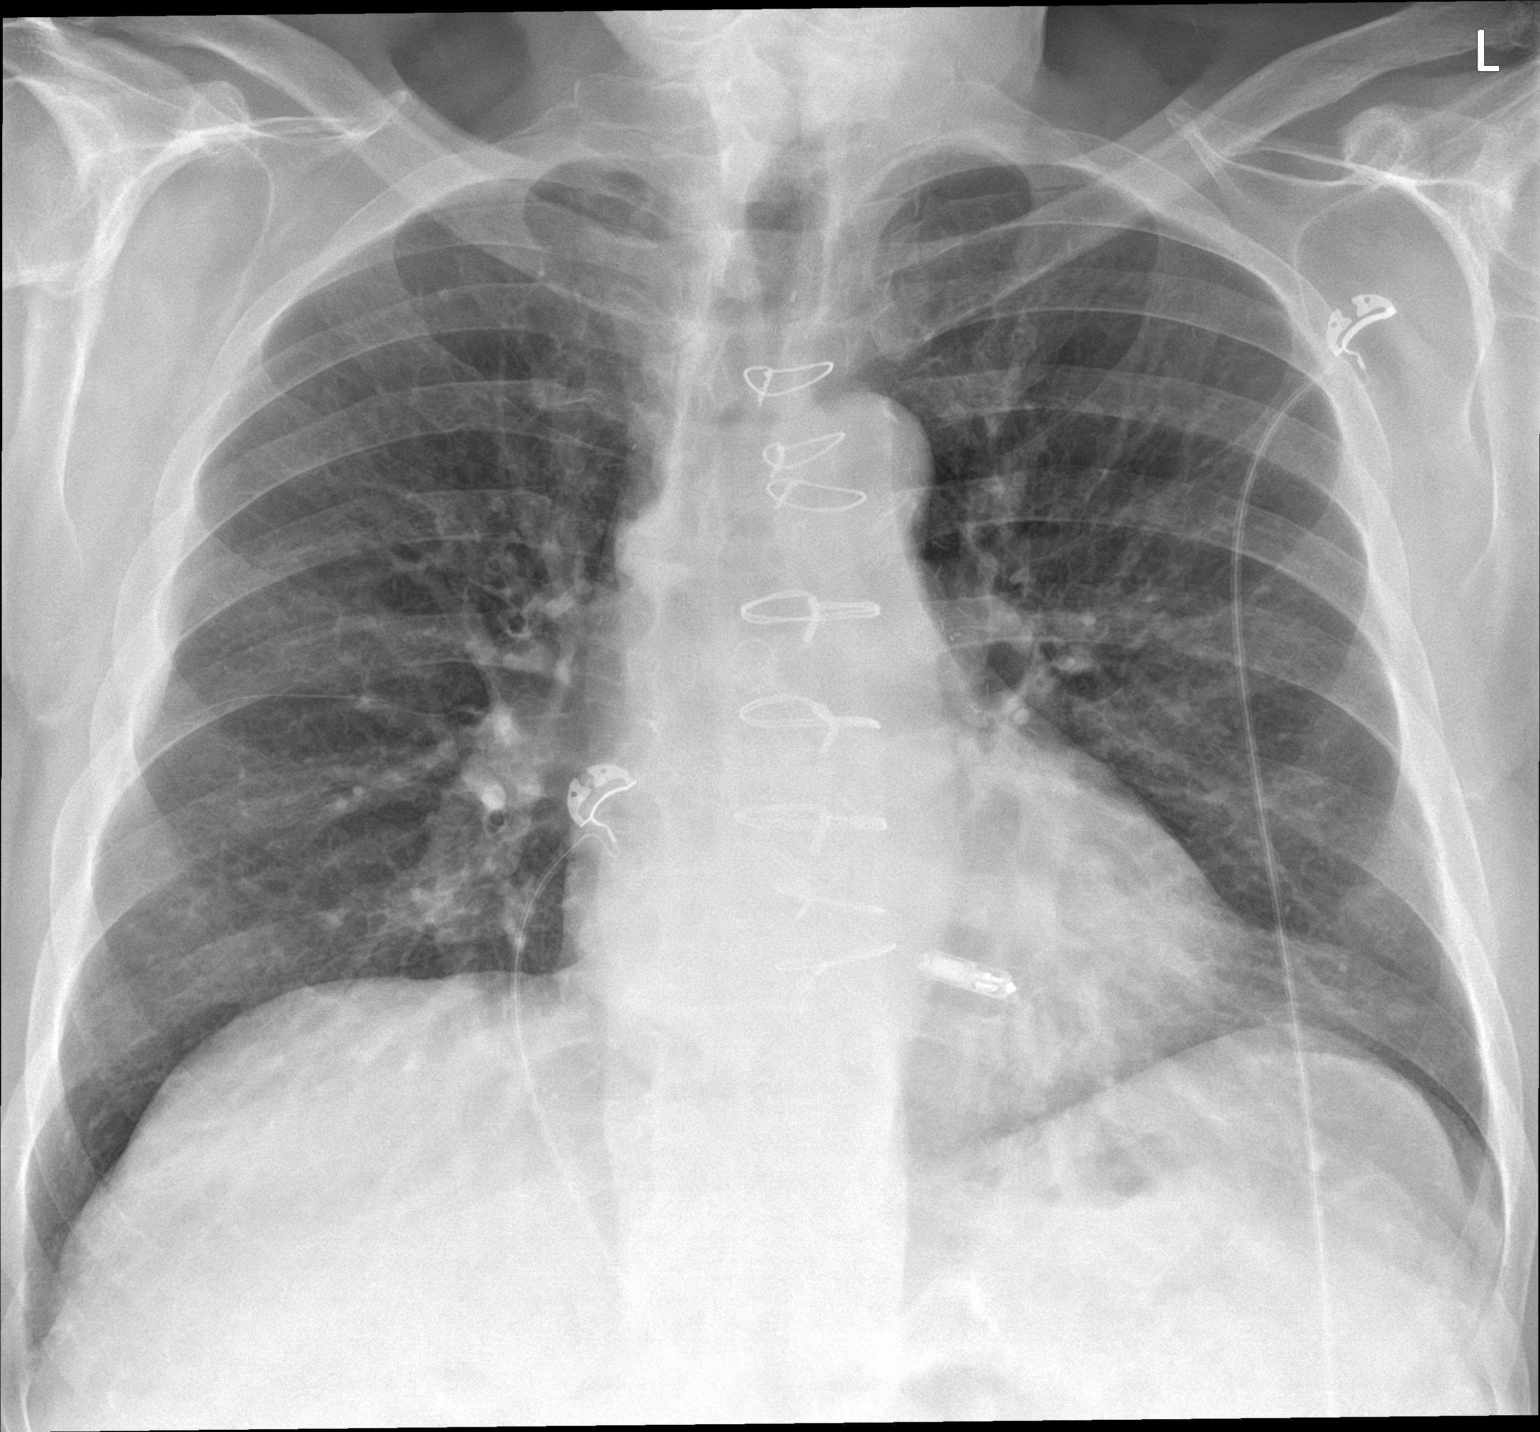

[chest lat]
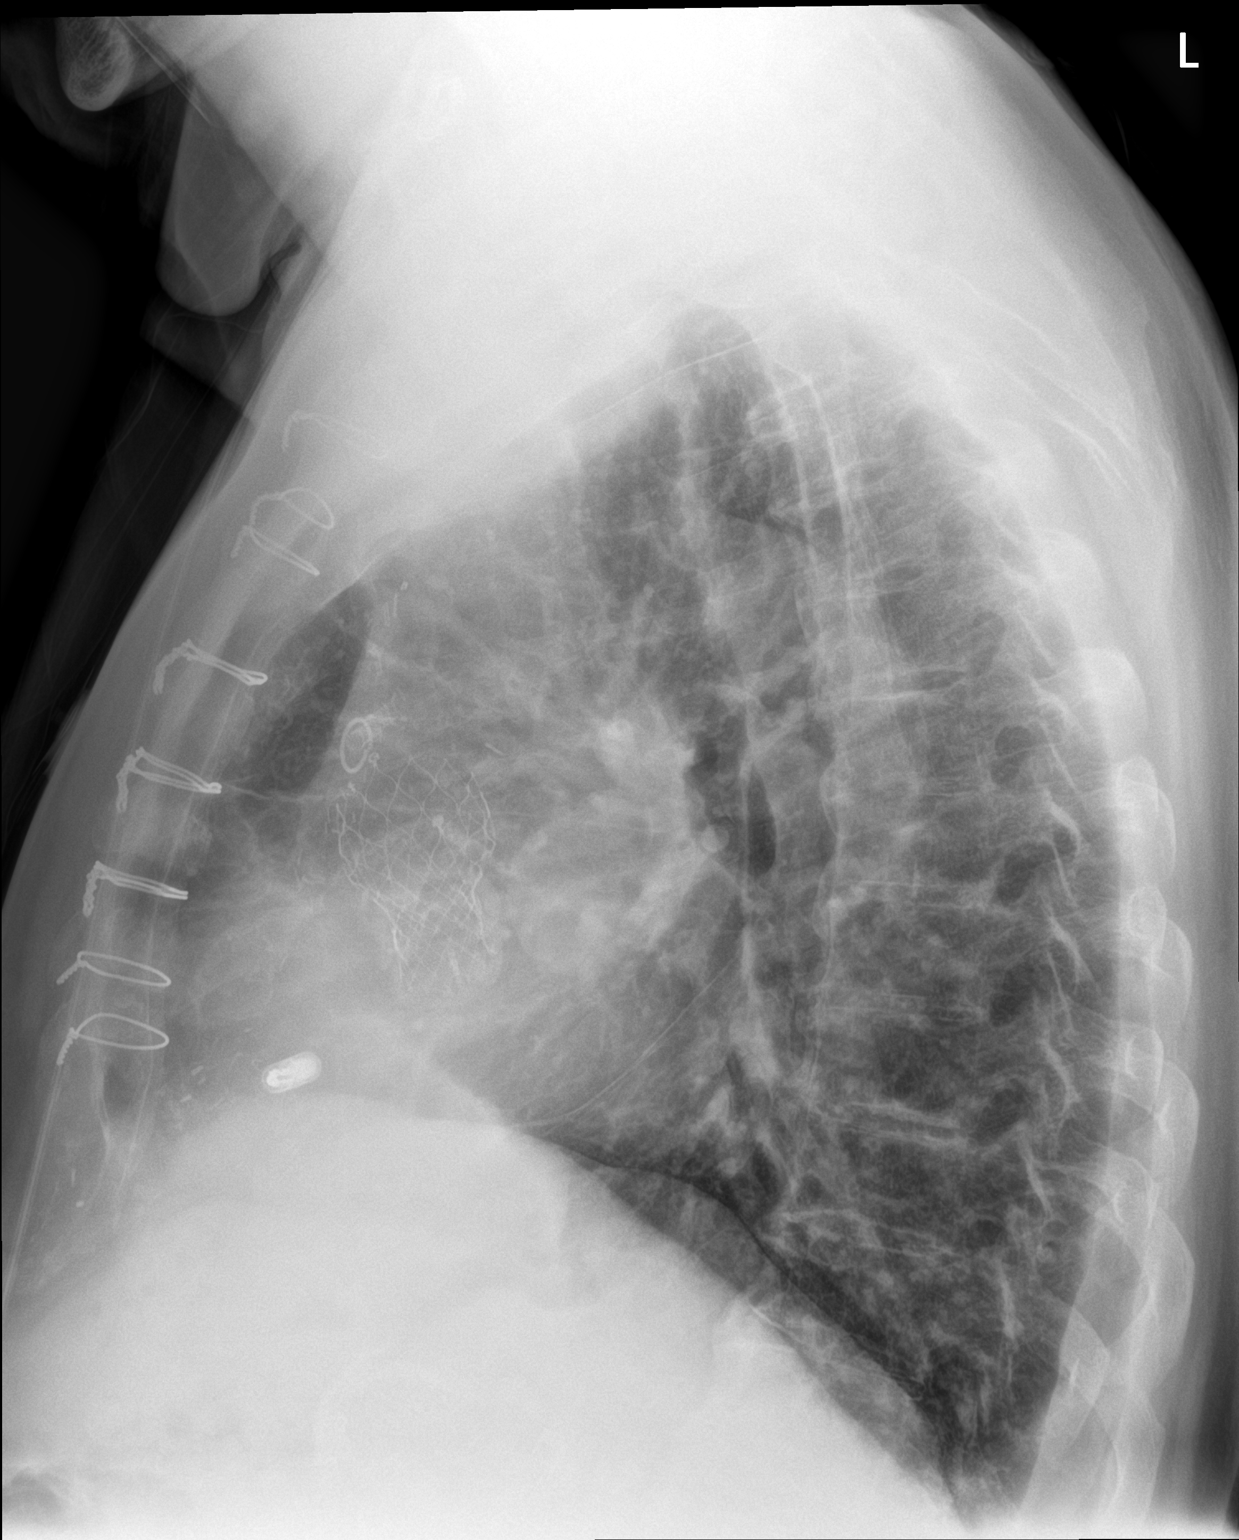

[2 of 2 positions shown; findings below may reference images not displayed]

FINDINGS: Cardiac apex region implanted recorder or defibrillator. Prior CABG
and TAVR. Borderline cardiomegaly. Other mediastinal contours are
within normal limits. Visualized tracheal air column is within
normal limits. Lung volumes and ventilation appear normal today. No
pneumothorax, pulmonary edema, pleural effusion or confluent
pulmonary opacity.

No acute osseous abnormality identified. Negative visible bowel gas
pattern.
IMPRESSION: No acute cardiopulmonary abnormality.

## 2021-09-09 NOTE — Progress Notes (Signed)
Discharge Progress Report  Patient Details  Name: Andre Miranda MRN: [id number] Date of Birth: [birth year] Referring Provider:   Flowsheet Row CARDIAC REHAB PHASE II ORIENTATION from 07/02/2021 in [place]  Referring Provider Daneen Schick, MD        Number of Visits: 21  Reason for Discharge:  Patient reached a stable level of exercise. Patient independent in their exercise. Patient has met program and personal goals.  Smoking History:  Social History   Tobacco Use  Smoking Status Former   Packs/day: 1.50   Years: 7.00   Pack years: 10.50   Types: Cigarettes   Quit date: 04/03/1971   Years since quitting: 50.4  Smokeless Tobacco Never    Diagnosis:  04/03/21 S/P DES SVG   ADL UCSD:   Initial Exercise Prescription:  Initial Exercise Prescription - 07/02/21 1000       Date of Initial Exercise RX and Referring Provider   Date 07/02/21    Referring Provider Daneen Schick, MD    Expected Discharge Date 08/30/21      NuStep   Level 2    SPM 75    Minutes 30    METs 2      Prescription Details   Frequency (times per week) 3    Duration Progress to 30 minutes of continuous aerobic without signs/symptoms of physical distress      Intensity   THRR 40-80% of Max Heartrate 55-110    Ratings of Perceived Exertion 11-13    Perceived Dyspnea 0-4      Progression   Progression Continue progressive overload as per policy without signs/symptoms or physical distress.      Resistance Training   Training Prescription Yes    Weight 3 lbs    Reps 10-15             Discharge Exercise Prescription (Final Exercise Prescription Changes):  Exercise Prescription Changes - 08/30/21 1500       Response to Exercise   Blood Pressure (Admit) 106/52    Blood Pressure (Exercise) 128/54    Blood Pressure (Exit) 116/60    Heart Rate (Admit) 72 bpm    Heart Rate (Exercise) 81 bpm    Heart Rate (Exit) 61 bpm    Rating of Perceived  Exertion (Exercise) 12    Symptoms None    Comments Pt graduated from the CRP2 program    Duration Continue with 30 min of aerobic exercise without signs/symptoms of physical distress.    Intensity THRR unchanged      Progression   Progression Continue to progress workloads to maintain intensity without signs/symptoms of physical distress.    Average METs 3      Resistance Training   Training Prescription Yes    Weight 3 lbs    Reps 10-15    Time 10 Minutes      Interval Training   Interval Training No      NuStep   Level 4    SPM 100    Minutes 30    METs 3      Home Exercise Plan   Plans to continue exercise at Home (comment)    Frequency Add 2 additional days to program exercise sessions.    Initial Home Exercises Provided 07/26/21             Functional Capacity:  6 Minute Walk     Row Name 07/02/21 0953 08/28/21 1317       6 Minute  Walk   Phase Initial Discharge    Distance 1314 feet 1442 feet    Distance % Change -- 9.75 %    Distance Feet Change -- 128 ft    Walk Time 6 minutes 6 minutes    # of Rest Breaks 0 0    MPH 2.49 2.73    METS 2.07 2.14    RPE 13 12    Perceived Dyspnea  0 1    VO2 Peak 7.26 7.48    Symptoms Yes (comment) Yes (comment)    Comments Chest pain (central chest/sternum) 2/10 resolved within 2 minutes. No other symptoms Left knee pain 1/10    Resting HR 54 bpm 61 bpm    Resting BP 120/68 98/52    Resting Oxygen Saturation  98 % 98 %    Exercise Oxygen Saturation  during 6 min walk 98 % 100 %    Max Ex. HR 111 bpm 112 bpm    Max Ex. BP 138/70 118/70    2 Minute Post BP 122/70 --             Psychological, QOL, Others - Outcomes: PHQ 2/9: Depression screen [facility] 2/9 08/30/2021 07/02/2021 07/02/2021 01/11/2016 09/12/2015  Decreased Interest 0 0 0 0 0  Down, Depressed, Hopeless 0 0 0 0 0  PHQ - 2 Score 0 0 0 0 0  Some recent data might be hidden    Quality of Life:  Quality of Life - 07/02/21 1125       Quality of  Life   Select Quality of Life      Quality of Life Scores   Health/Function Pre 22.32 %    Socioeconomic Pre 24.79 %    Psych/Spiritual Pre 23.79 %    Family Pre 24 %    GLOBAL Pre 23.41 %             Personal Goals: Goals established at orientation with interventions provided to work toward goal.  Personal Goals and Risk Factors at Admission - 07/02/21 1126       Core Components/Risk Factors/Patient Goals on Admission    Weight Management Yes;Obesity;Weight Loss    Intervention Weight Management: Develop a combined nutrition and exercise program designed to reach desired caloric intake, while maintaining appropriate intake of nutrient and fiber, sodium and fats, and appropriate energy expenditure required for the weight goal.;Weight Management: Provide education and appropriate resources to help participant work on and attain dietary goals.;Weight Management/Obesity: Establish reasonable short term and long term weight goals.;Obesity: Provide education and appropriate resources to help participant work on and attain dietary goals.    Admit Weight 227 lb 15.3 oz (103.4 kg)    Expected Outcomes Short Term: Continue to assess and modify interventions until short term weight is achieved;Long Term: Adherence to nutrition and physical activity/exercise program aimed toward attainment of established weight goal;Weight Maintenance: Understanding of the daily nutrition guidelines, which includes 25-35% calories from fat, 7% or less cal from saturated fats, less than 266m cholesterol, less than 1.5gm of sodium, & 5 or more servings of fruits and vegetables daily;Weight Loss: Understanding of general recommendations for a balanced deficit meal plan, which promotes 1-2 lb weight loss per week and includes a negative energy balance of [phone] kcal/d;Understanding recommendations for meals to include 15-35% energy as protein, 25-35% energy from fat, 35-60% energy from carbohydrates, less than 2080mof  dietary cholesterol, 20-35 gm of total fiber daily;Understanding of distribution of calorie intake throughout the day with the consumption  of 4-5 meals/snacks    Hypertension Yes    Intervention Provide education on lifestyle modifcations including regular physical activity/exercise, weight management, moderate sodium restriction and increased consumption of fresh fruit, vegetables, and low fat dairy, alcohol moderation, and smoking cessation.;Monitor prescription use compliance.    Expected Outcomes Short Term: Continued assessment and intervention until BP is < 140/66m HG in hypertensive participants. < 130/864mHG in hypertensive participants with diabetes, heart failure or chronic kidney disease.;Long Term: Maintenance of blood pressure at goal levels.    Lipids Yes    Intervention Provide education and support for participant on nutrition & aerobic/resistive exercise along with prescribed medications to achieve LDL <7086mHDL >78m45m  Expected Outcomes Short Term: Participant states understanding of desired cholesterol values and is compliant with medications prescribed. Participant is following exercise prescription and nutrition guidelines.;Long Term: Cholesterol controlled with medications as prescribed, with individualized exercise RX and with personalized nutrition plan. Value goals: LDL < 70mg50mL > 40 mg.              Personal Goals Discharge:  Goals and Risk Factor Review     Row Name 07/08/21 1637 07/30/21 1444 08/23/21 1707         Core Components/Risk Factors/Patient Goals Review   Personal Goals Review Weight Management/Obesity;Hypertension;Lipids Weight Management/Obesity;Hypertension;Lipids Weight Management/Obesity;Hypertension;Lipids     Review Bill started exercise at cardiac rehab on 07/08/21 and did well with exercise for his fitness level. VSS Bill continues to do well with exercise at phase 2 cardiac rehab. Resting systolic BP's have been in the 90's will continue  to monitor BP. Patient asymptomatic. [place] to do well with exercise at phase 2 cardiac rehab. Resting systolic BP's have been in the 90's at times. will continue to monitor BP. Patient asymptomatic. Bill [place] complete phase 2 cardiac rehab on 08/23/21.     Expected Outcomes Bill will contionue to participate in phase 2 cardiac rehab for exercise, nutrition and lifestyle modifications Bill will contionue to participate in phase 2 cardiac rehab for exercise, nutrition and lifestyle modifications Bill will contionue to participate in phase 2 cardiac rehab for exercise, nutrition and lifestyle modifications              Exercise Goals and Review:  Exercise Goals     Row Name 07/02/21 1046             Exercise Goals   Increase Physical Activity Yes       Intervention Provide advice, education, support and counseling about physical activity/exercise needs.;Develop an individualized exercise prescription for aerobic and resistive training based on initial evaluation findings, risk stratification, comorbidities and participant's personal goals.       Expected Outcomes Short Term: Attend rehab on a regular basis to increase amount of physical activity.;Long Term: Add in home exercise to make exercise part of routine and to increase amount of physical activity.;Long Term: Exercising regularly at least 3-5 days a week.       Increase Strength and Stamina Yes       Intervention Provide advice, education, support and counseling about physical activity/exercise needs.;Develop an individualized exercise prescription for aerobic and resistive training based on initial evaluation findings, risk stratification, comorbidities and participant's personal goals.       Expected Outcomes Short Term: Increase workloads from initial exercise prescription for resistance, speed, and METs.;Short Term: Perform resistance training exercises routinely during rehab and add in resistance training at home;Long Term:  Improve cardiorespiratory fitness, muscular endurance and strength  as measured by increased METs and functional capacity (6MWT)       Able to understand and use rate of perceived exertion (RPE) scale Yes       Intervention Provide education and explanation on how to use RPE scale       Expected Outcomes Short Term: Able to use RPE daily in rehab to express subjective intensity level;Long Term:  Able to use RPE to guide intensity level when exercising independently       Knowledge and understanding of Target Heart Rate Range (THRR) Yes       Intervention Provide education and explanation of THRR including how the numbers were predicted and where they are located for reference       Expected Outcomes Short Term: Able to state/look up THRR;Long Term: Able to use THRR to govern intensity when exercising independently;Short Term: Able to use daily as guideline for intensity in rehab       Understanding of Exercise Prescription Yes       Intervention Provide education, explanation, and written materials on patient's individual exercise prescription       Expected Outcomes Short Term: Able to explain program exercise prescription;Long Term: Able to explain home exercise prescription to exercise independently                Exercise Goals Re-Evaluation:  Exercise Goals Re-Evaluation     Row Name 07/08/21 1530 07/26/21 1622 08/07/21 1500 08/09/21 0833 08/30/21 1500     Exercise Goal Re-Evaluation   Exercise Goals Review Increase Physical Activity;Increase Strength and Stamina;Able to understand and use rate of perceived exertion (RPE) scale;Knowledge and understanding of Target Heart Rate Range (THRR);Understanding of Exercise Prescription Increase Physical Activity;Increase Strength and Stamina;Able to understand and use rate of perceived exertion (RPE) scale;Knowledge and understanding of Target Heart Rate Range (THRR);Able to check pulse independently;Understanding of Exercise Prescription Increase  Physical Activity;Increase Strength and Stamina;Able to understand and use rate of perceived exertion (RPE) scale;Knowledge and understanding of Target Heart Rate Range (THRR);Able to check pulse independently;Understanding of Exercise Prescription -- Increase Physical Activity;Increase Strength and Stamina;Able to understand and use rate of perceived exertion (RPE) scale;Knowledge and understanding of Target Heart Rate Range (THRR);Able to check pulse independently;Understanding of Exercise Prescription   Comments Pt's first day in the CRP2 program. Pt understands the exercise Rx, THRR, and RPE scale. Reviewed METs and home exercise Rx. Pt is not currently walking at home but willing to start walking at home 2x/week for 30 minutes in addtion to his CRP2 program. Reviewed METs and Goals. Pt is progressing in his goal of more strength and stamina. Pt voices he feels better since beginning the CRP2 program and is also walking at home 1-2 x week for 35 minutes. Encouraged walking 2-3x/week. -- Pt graduated from the [place] program today. Pt made good progress in the program and had a peak MET level of 3.4. Pt is also walking at home 3-4x/week. Pt voices that he achived his goal of having more strength and stamina. Pt planns to continue walking at home and going to the [place] [facility] for exercise.   Expected Outcomes Will continue to monitor patient and progress exercise workloads as tolerated. Pt will walk at home 2 x/week. Will continue to monitor progress and advance workloads as tolerated. . Pt will continue to exercise at home and local fitness center.    Row Name 09/02/21 0820             Exercise Goal Re-Evaluation  Exercise Goals Review --       Comments --       Expected Outcomes --                Nutrition & Weight - Outcomes:  Pre Biometrics - 07/02/21 0920       Pre Biometrics   Waist Circumference 46.25 inches    Hip Circumference 45.5 inches    Waist to Hip Ratio 1.02 %    Triceps  Skinfold 21 mm    % Body Fat 34.6 %    Grip Strength 35 kg    Flexibility 12 in    Single Leg Stand 4.68 seconds             Post Biometrics - 08/28/21 1415        Post  Biometrics   Height 5' 8.75" (1.746 m)    Weight 103.5 kg    Waist Circumference 46 inches    Hip Circumference 45 inches    Waist to Hip Ratio 1.02 %    BMI (Calculated) 33.95    Triceps Skinfold 20 mm    % Body Fat 34.3 %    Grip Strength 45 kg    Flexibility 13 in    Single Leg Stand 3.68 seconds             Nutrition:  Nutrition Therapy & Goals - 07/11/21 0829       Nutrition Therapy   Diet TLC    Drug/Food Interactions Statins/Certain Fruits      Personal Nutrition Goals   Nutrition Goal Pt to avoid butter    Personal Goal #2 Pt to cut down on meats high in saturated fat.    Personal Goal #3 Pt to eat 3 servings whole grains daily      Intervention Plan   Intervention Prescribe, educate and counsel regarding individualized specific dietary modifications aiming towards targeted core components such as weight, hypertension, lipid management, diabetes, heart failure and other comorbidities.;Nutrition handout(s) given to patient.    Expected Outcomes Long Term Goal: Adherence to prescribed nutrition plan.;Short Term Goal: A plan has been developed with personal nutrition goals set during dietitian appointment.             Nutrition Discharge:   Education Questionnaire Score:  Knowledge Questionnaire Score - 08/30/21 1500       Knowledge Questionnaire Score   Post Score 23/24             Goals reviewed with patient; copy given to patient.Pt graduated from cardiac rehab program on 08/30/21 with completion of 21 exercise sessions in Phase II. Pt maintained good attendance and progressed nicely during his participation in rehab as evidenced by increased MET level.   Medication list reconciled. Repeat  PHQ score-0  .  Pt has made significant lifestyle changes and should be commended  for his success. Pt feels he has achieved his goals during cardiac rehab.   Pt plans to continue exercise by walking and exercising at the [facility].Bill increased his distance on his post exercise walk test by 128 feet. We are proud of Bill's progress!Barnet Pall, RN,BSN 09/09/2021 10:22 AM

## 2021-09-29 DIAGNOSIS — Z950 Presence of cardiac pacemaker: Secondary | ICD-10-CM | POA: Diagnosis not present

## 2021-10-14 ENCOUNTER — Ambulatory Visit: Payer: Medicare Other | Admitting: Interventional Cardiology

## 2021-10-16 ENCOUNTER — Other Ambulatory Visit (HOSPITAL_COMMUNITY): Payer: Self-pay | Admitting: Interventional Cardiology

## 2021-10-16 DIAGNOSIS — E785 Hyperlipidemia, unspecified: Secondary | ICD-10-CM | POA: Diagnosis not present

## 2021-10-16 DIAGNOSIS — I6523 Occlusion and stenosis of bilateral carotid arteries: Secondary | ICD-10-CM

## 2021-10-16 DIAGNOSIS — I509 Heart failure, unspecified: Secondary | ICD-10-CM | POA: Diagnosis not present

## 2021-10-16 DIAGNOSIS — I110 Hypertensive heart disease with heart failure: Secondary | ICD-10-CM | POA: Diagnosis not present

## 2021-10-17 DIAGNOSIS — Z8546 Personal history of malignant neoplasm of prostate: Secondary | ICD-10-CM | POA: Diagnosis not present

## 2021-10-17 DIAGNOSIS — Z951 Presence of aortocoronary bypass graft: Secondary | ICD-10-CM | POA: Diagnosis not present

## 2021-10-17 DIAGNOSIS — I340 Nonrheumatic mitral (valve) insufficiency: Secondary | ICD-10-CM | POA: Diagnosis not present

## 2021-10-17 DIAGNOSIS — Z45018 Encounter for adjustment and management of other part of cardiac pacemaker: Secondary | ICD-10-CM | POA: Diagnosis not present

## 2021-10-17 DIAGNOSIS — Z7982 Long term (current) use of aspirin: Secondary | ICD-10-CM | POA: Diagnosis not present

## 2021-10-17 DIAGNOSIS — Z7902 Long term (current) use of antithrombotics/antiplatelets: Secondary | ICD-10-CM | POA: Diagnosis not present

## 2021-10-17 DIAGNOSIS — I493 Ventricular premature depolarization: Secondary | ICD-10-CM | POA: Diagnosis not present

## 2021-10-17 DIAGNOSIS — Z923 Personal history of irradiation: Secondary | ICD-10-CM | POA: Diagnosis not present

## 2021-10-17 DIAGNOSIS — Z87891 Personal history of nicotine dependence: Secondary | ICD-10-CM | POA: Diagnosis not present

## 2021-10-17 DIAGNOSIS — Z950 Presence of cardiac pacemaker: Secondary | ICD-10-CM | POA: Diagnosis not present

## 2021-10-17 DIAGNOSIS — R9431 Abnormal electrocardiogram [ECG] [EKG]: Secondary | ICD-10-CM | POA: Diagnosis not present

## 2021-10-17 DIAGNOSIS — Z955 Presence of coronary angioplasty implant and graft: Secondary | ICD-10-CM | POA: Diagnosis not present

## 2021-10-17 DIAGNOSIS — I2510 Atherosclerotic heart disease of native coronary artery without angina pectoris: Secondary | ICD-10-CM | POA: Diagnosis not present

## 2021-10-17 DIAGNOSIS — I440 Atrioventricular block, first degree: Secondary | ICD-10-CM | POA: Diagnosis not present

## 2021-10-17 DIAGNOSIS — Z952 Presence of prosthetic heart valve: Secondary | ICD-10-CM | POA: Diagnosis not present

## 2021-10-17 DIAGNOSIS — R0789 Other chest pain: Secondary | ICD-10-CM | POA: Diagnosis not present

## 2021-10-17 DIAGNOSIS — I517 Cardiomegaly: Secondary | ICD-10-CM | POA: Diagnosis not present

## 2021-10-21 ENCOUNTER — Encounter: Payer: Self-pay | Admitting: Interventional Cardiology

## 2021-10-26 DIAGNOSIS — I493 Ventricular premature depolarization: Secondary | ICD-10-CM | POA: Diagnosis not present

## 2021-10-29 MED ORDER — AMLODIPINE BESYLATE 10 MG PO TABS: 10.0000 mg | ORAL_TABLET | Freq: Every day | ORAL | 3 refills | Status: DC

## 2021-10-29 NOTE — Progress Notes (Signed)
Subjective:    Patient ID: Andre Miranda, male    DOB: 1936-02-25, 85 y.o.   MRN: [id number]  HPI  M former smoker followed for OSA complicated by CAD/CABG/PAD, aortic stenosis, lung nodules CPAP 10/ [facility]    Had flu vaccine PFT 06/29/2012-mild obstructive airways disease and small airways with response to bronchodilator. Normal lung volumes and diffusion. FEV13.10/106%, FEV1/FVC 0.79, FEF 25-75% 3.04/118% with 72% response to bronchodilator.  ------------------------------------------------------------------------------------   10/30/20- 85 year old male former smoker followed for OSA complicated by history CAD/CABG/PAD/ pacemaker, Aortic stenosis/TAVR, lung nodules/ granulomas, prostate cancer -Temazepam 30 CPAP auto 5-15/ [facility]/ [facility]/ [facility]/ [facility]" Andre Miranda is blank Body weight today- 225 lbs Covid vax- Phizer x 3 Flu vax- Had -----Using CPAP nightly  Respironics machine was recalled but still works ok. Has not heard from DME. We are trying to verify appropriate DME supplier.  He has questions about [employer]- discussed. In past year has had pacemaker, TAVR replaced, cataract surgery. Says he is breathing comfortably. CXR 09/23/2019- 1V- IMPRESSION: Persisting reticulonodular opacities at the lung bases with new small pleural effusions, potentially multifocal infection and/or edema.  10/30/21- 85 year old male former smoker followed for OSA complicated by history CAD/CABG/PAD/ Pacemaker, Aortic stenosis/TAVR, lung nodules/ granulomas, prostate cancer -Temazepam 15 CPAP auto 5-15/ [facility]/ [facility]/ [employer]/ [facility]" Download- compliance 97.8%, AHI/ 3.9/ hr Body weight today- 222 lbs Covid vax- Phizer x 4 Flu vax- had -----Wearing Cpap- doing good Sleeping well with temazepam. Discussed. Download reviewed. Sleep quality good with temazepam. We discussed alternatives and option to try to wean or work it off. He is  satisfied to continue. CXR 06/24/21- IMPRESSION: No acute cardiopulmonary abnormality.  ROS-see HPI + = positive Constitutional:   No-   weight loss, night sweats, fevers, chills, fatigue, lassitude. HEENT:   No-  headaches, difficulty swallowing, tooth/dental problems, sore throat,       No-  sneezing, itching, ear ache, nasal congestion, post nasal drip,  CV:  chest pain,  No-orthopnea, PND, swelling in lower extremities, anasarca,   dizziness, palpitations Resp: No-   shortness of breath with exertion or at rest.             +  productive cough,  + non-productive cough,  No- coughing up of blood.              No-   change in color of mucus.  No- wheezing.   Skin: No-   rash or lesions. GI:  No-   heartburn, indigestion, abdominal pain, nausea, vomiting,  GU:  MS:  No-   joint pain or swelling.  . Neuro-     nothing unusual Psych:  No- change in mood or affect. No depression or anxiety.  No memory loss.  Objective:   Physical Exam General- Alert, Oriented, Affect-appropriate, Distress- none acute  + Obese Skin- rash-none, lesions- none, excoriation- none Lymphadenopathy- none Head- atraumatic Eyes- Gross vision intact, PERRLA, conjunctivae clear, secretions Ears- +Hearing aids Nose- Clear, No-Septal dev, mucus, polyps, erosion, perforation Mild external deviation Throat- Mallampati II-III , mucosa clear , drainage- none, tonsils- atrophic Neck- flexible , trachea midline, no stridor , thyroid nl, carotid no bruit   Thick neck Chest - symmetrical excursion , unlabored  Heart/CV- RRR , murmur+2/6 S LUSB , no gallop  , no rub, nl s1 s2  - JVD- none , edema- none, stasis changes- none, varices- none  Lung- clear to P&A, wheeze- none, cough- none, dullness-none, rub-  none   Chest wall- Abd-  Br/ Gen/ Rectal- Not done, not indicated Extrem- cyanosis- none, clubbing, none, atrophy- none, strength- nl Neuro- grossly intact to observation  Assessment & Plan:

## 2021-10-30 ENCOUNTER — Ambulatory Visit (INDEPENDENT_AMBULATORY_CARE_PROVIDER_SITE_OTHER): Payer: Medicare Other | Admitting: Internal Medicine

## 2021-10-30 ENCOUNTER — Other Ambulatory Visit: Payer: Self-pay

## 2021-10-30 ENCOUNTER — Encounter: Payer: Self-pay | Admitting: Internal Medicine

## 2021-10-30 VITALS — BP 128/60 | HR 63 | Temp 97.7°F | Ht 70.0 in | Wt 222.6 lb

## 2021-10-30 DIAGNOSIS — G4733 Obstructive sleep apnea (adult) (pediatric): Secondary | ICD-10-CM

## 2021-10-30 DIAGNOSIS — F5101 Primary insomnia: Secondary | ICD-10-CM | POA: Diagnosis not present

## 2021-10-30 DIAGNOSIS — I701 Atherosclerosis of renal artery: Secondary | ICD-10-CM | POA: Diagnosis not present

## 2021-10-30 NOTE — Assessment & Plan Note (Signed)
Benefits from CPAP with good compliance and control.  He does not sleep without it. Plan-continue CPAP 5-15

## 2021-10-30 NOTE — Assessment & Plan Note (Signed)
Temazepam works very well with no significant side effects.  We discussed long-term issues of dependence, alternatives and safety.  We agreed not to change for now.

## 2021-10-30 NOTE — Patient Instructions (Addendum)
Order-DME [facility]   please replace old CPAP machine, auto 5-15, mask of choice, humidifier, supplies, AirView/ card  We discussed temazepam. I don't feel any significant pressure to change this as long as it works well for you, as discussed  Please call if we can help

## 2021-11-06 DIAGNOSIS — U071 COVID-19: Secondary | ICD-10-CM | POA: Diagnosis not present

## 2021-11-15 DIAGNOSIS — I509 Heart failure, unspecified: Secondary | ICD-10-CM | POA: Diagnosis not present

## 2021-11-15 DIAGNOSIS — I110 Hypertensive heart disease with heart failure: Secondary | ICD-10-CM | POA: Diagnosis not present

## 2021-11-15 DIAGNOSIS — E785 Hyperlipidemia, unspecified: Secondary | ICD-10-CM | POA: Diagnosis not present

## 2021-11-26 DIAGNOSIS — I509 Heart failure, unspecified: Secondary | ICD-10-CM | POA: Diagnosis not present

## 2021-11-26 DIAGNOSIS — M109 Gout, unspecified: Secondary | ICD-10-CM | POA: Diagnosis not present

## 2021-11-26 DIAGNOSIS — I10 Essential (primary) hypertension: Secondary | ICD-10-CM | POA: Diagnosis not present

## 2021-11-26 DIAGNOSIS — I110 Hypertensive heart disease with heart failure: Secondary | ICD-10-CM | POA: Diagnosis not present

## 2021-11-26 DIAGNOSIS — I2510 Atherosclerotic heart disease of native coronary artery without angina pectoris: Secondary | ICD-10-CM | POA: Diagnosis not present

## 2021-11-26 DIAGNOSIS — D696 Thrombocytopenia, unspecified: Secondary | ICD-10-CM | POA: Diagnosis not present

## 2021-11-26 DIAGNOSIS — R7301 Impaired fasting glucose: Secondary | ICD-10-CM | POA: Diagnosis not present

## 2021-11-26 DIAGNOSIS — R911 Solitary pulmonary nodule: Secondary | ICD-10-CM | POA: Diagnosis not present

## 2021-11-26 DIAGNOSIS — I350 Nonrheumatic aortic (valve) stenosis: Secondary | ICD-10-CM | POA: Diagnosis not present

## 2021-11-26 DIAGNOSIS — E785 Hyperlipidemia, unspecified: Secondary | ICD-10-CM | POA: Diagnosis not present

## 2021-11-26 DIAGNOSIS — R946 Abnormal results of thyroid function studies: Secondary | ICD-10-CM | POA: Diagnosis not present

## 2021-12-02 ENCOUNTER — Encounter: Payer: Self-pay | Admitting: Interventional Cardiology

## 2021-12-03 ENCOUNTER — Encounter: Payer: Self-pay | Admitting: Internal Medicine

## 2021-12-04 DIAGNOSIS — R911 Solitary pulmonary nodule: Secondary | ICD-10-CM | POA: Diagnosis not present

## 2021-12-04 DIAGNOSIS — R918 Other nonspecific abnormal finding of lung field: Secondary | ICD-10-CM | POA: Diagnosis not present

## 2021-12-25 NOTE — Telephone Encounter (Signed)
Routing to PCCs.

## 2021-12-25 NOTE — Telephone Encounter (Signed)
Andre Miranda from [facility] called and states they never received an order for a cpap machine. Call back number is [phone] and fax is [phone].  Please advise.

## 2022-01-06 NOTE — Progress Notes (Signed)
Cardiology Office Note:    Date:  01/07/2022   ID:  Andre Miranda, DOB 1936-05-25, MRN [id number]  PCP:  Crist Infante, MD  Cardiologist:  Sinclair Grooms, MD   Referring MD: Crist Infante, MD   Chief Complaint  Patient presents with   Coronary Artery Disease   Congestive Heart Failure    History of Present Illness:    Andre Miranda is a 86 y.o. male with a hx of  CAD with prior CABG(LIMA LAD, RIMA RCA, & SVG RI/OM in 2003, unstable angina treated with stent to SVG RI/OM 03/2021, aortic stenosis s/p TAVR 2016 at [facility], subsequent severe aortic regurgitation --> repeat TAVR prosthesis 10/12/2019, hyperlipidemia, prior difficult to control HTN, and chronic diastolic HF.  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  Acute on chronic diastolic heart failure resolved after repeat TAVR.   He is doing okay.  He has intermittent lightheadedness at times.  He has brought a log of his blood pressures and shortly after taking the morning medications his blood pressures can drop into the 105/60 mmHg range.  The pressures tend to be higher first thing in the morning and in the evening.  He has stable angina pectoris.  Has not used any nitroglycerin.  If he feels angina, he stops physical activity and the discomfort resolves.  He denies orthopnea, PND, syncope, and has some left pedal edema.  He denies neurological symptoms and bleeding.  Past Medical History:  Diagnosis Date   Aortic stenosis    MODERATE   Cancer (HCC)    prostate cancer-radiation   Carotid arterial disease (HCC)    Coronary artery disease    Dysuria    GERD (gastroesophageal reflux disease)    Gout    Hematuria    History of urinary retention    Hyperlipidemia    Hypertension    Obesity    Sleep apnea    uses CPAP nightly    Past Surgical History:  Procedure Laterality Date   AORTIC VALVE REPAIR  02/06/2015   Dr. Aline Brochure and Dr. Ysidro Evert  at [place]  09/26/2019    Procedure: BIOPSY;  Surgeon: Otis Brace, MD;  Location: [place];  Service: Gastroenterology;;   CAROTID ENDARTERECTOMY  02/16/2008   CHOLECYSTECTOMY  04/17/2002   COLONOSCOPY WITH PROPOFOL Left 09/26/2019   Procedure: COLONOSCOPY WITH PROPOFOL;  Surgeon: Otis Brace, MD;  Location: [place];  Service: Gastroenterology;  Laterality: Left;   CORONARY ARTERY BYPASS GRAFT  11/17/2001   LIMA TO THE LAD, RIMA TO THE RCA, AND A SAPHENOUS VEIN GRAFT TO THE INTERMEDIATE AND DISTAL LEFT CIRCUMFLEX   CORONARY STENT INTERVENTION N/A 04/03/2021   Procedure: CORONARY STENT INTERVENTION;  Surgeon: Belva Crome, MD;  Location: [place] [facility];  Service: Cardiovascular;  Laterality: N/A;   CORONARY/GRAFT ANGIOGRAPHY N/A 09/27/2019   Procedure: CORONARY/GRAFT ANGIOGRAPHY;  Surgeon: Belva Crome, MD;  Location: [place] [facility];  Service: Cardiovascular;  Laterality: N/A;   CORONARY/GRAFT ANGIOGRAPHY N/A 04/03/2021   Procedure: CORONARY/GRAFT ANGIOGRAPHY;  Surgeon: Belva Crome, MD;  Location: [place] [facility];  Service: Cardiovascular;  Laterality: N/A;   ESOPHAGOGASTRODUODENOSCOPY (EGD) WITH PROPOFOL Left 09/26/2019   Procedure: ESOPHAGOGASTRODUODENOSCOPY (EGD) WITH PROPOFOL;  Surgeon: Otis Brace, MD;  Location: [facility];  Service: Gastroenterology;  Laterality: Left;   INSERT / REPLACE / REMOVE PACEMAKER     ORIF ANKLE FRACTURE Right 02/14/2019   Procedure: OPEN REDUCTION INTERNAL FIXATION (ORIF) ANKLE FRACTURE;  Surgeon: Netta Cedars, MD;  Location: [place];  Service: Orthopedics;  Laterality: Right;   POLYPECTOMY  09/26/2019   Procedure: POLYPECTOMY;  Surgeon: Otis Brace, MD;  Location: [place] [facility];  Service: Gastroenterology;;   US ECHOCARDIOGRAPHY  08/17/2009   SHOWED A MEAN GRADIENT ACROSS IS AORTIC VALVE OF 24 MM OF MERCURY. HE HAD MODERATE LVH AND NORMAL LV FUNCTION    Current Medications: Current Meds  Medication Sig    acetaminophen (TYLENOL) 500 MG tablet Take 500 mg by mouth every 6 (six) hours as needed for moderate pain.   allopurinol (ZYLOPRIM) 300 MG tablet Take 300 mg by mouth daily.   amLODipine (NORVASC) 5 MG tablet Take 1 tablet by mouth daily.   aspirin EC 81 MG tablet Take 81 mg by mouth at bedtime.    azithromycin (ZITHROMAX) 500 MG tablet Take 500 mg by mouth See admin instructions. Take 500 mg 1 hour prior to dental work   Cholecalciferol (VITAMIN D3) 1000 UNITS CAPS Take 1,000 Units by mouth at bedtime.   clopidogrel (PLAVIX) 75 MG tablet Take 1 tablet (75 mg total) by mouth daily with breakfast.   famotidine (PEPCID) 20 MG tablet Take 20 mg by mouth daily before breakfast.   furosemide (LASIX) 40 MG tablet TAKE 1 TABLET BY MOUTH EVERY DAY   isosorbide mononitrate (IMDUR) 60 MG 24 hr tablet Take 1.5 tablets (90 mg total) by mouth daily.   metoprolol tartrate (LOPRESSOR) 25 MG tablet Take 1 tablet (25 mg total) by mouth 2 (two) times daily.   miconazole (ZEASORB-AF) 2 % powder Apply 1 application topically as needed for itching.   Multiple Vitamins-Minerals (CENTRUM SILVER) tablet Take 1 tablet by mouth daily with breakfast.   polyethylene glycol powder (GLYCOLAX/MIRALAX) 17 GM/SCOOP powder Take 17 g by mouth daily as needed for moderate constipation.   PRESCRIPTION MEDICATION CPAP: At bedtime   ramipril (ALTACE) 5 MG capsule Take 1 capsule (5 mg total) by mouth at bedtime.   rosuvastatin (CRESTOR) 10 MG tablet Take 10 mg by mouth every evening.   Tamsulosin HCl (FLOMAX) 0.4 MG CAPS Take 0.4 mg by mouth every evening.   temazepam (RESTORIL) 15 MG capsule Take 15-30 mg by mouth at bedtime as needed for sleep.   [DISCONTINUED] amLODipine (NORVASC) 10 MG tablet Take 1 tablet (10 mg total) by mouth daily.     Allergies:   Ibuprofen   Social History   Socioeconomic History   Marital status: Married    Spouse name: Not on file   Number of children: 6   Years of education: 16   Highest  education level: Bachelor's degree (e.g., BA, AB, BS)  Occupational History   Occupation: Retired  Tobacco Use   Smoking status: Former    Packs/day: 1.50    Years: 7.00    Pack years: 10.50    Types: Cigarettes    Quit date: 04/03/1971    Years since quitting: 50.8   Smokeless tobacco: Never  Vaping Use   Vaping Use: Never used  Substance and Sexual Activity   Alcohol use: Yes    Alcohol/week: 4.0 standard drinks    Types: 4 Cans of beer per week    Comment: social   Drug use: No   Sexual activity: Not on file  Other Topics Concern   Not on file  Social History Narrative   Not on file   Social Determinants of Health   Financial Resource Strain: Not on file  Food Insecurity: Not on file  Transportation Needs: Not on file  Physical Activity: Not on file  Stress: Not on file  Social Connections: Not on file     Family History: The patient's family history includes Heart attack in his father; Pneumonia (age of onset: 17) in his mother.  ROS:   Please see the history of present illness.    Left lower extremity swelling, supposed to have an upcoming upper GI endoscopy.  He wonders if he really needs this.  At times he feels he cannot get a deep breath.  All other systems reviewed and are negative.  EKGs/Labs/Other Studies Reviewed:    The following studies were reviewed today: 48-hour Holter monitor performed 10/29/2021 at [facility]: Atrial sensed ventricular paced rhythm Ventricular ectopic activity, multiform, couplets, triplets, bigeminy, up to 5 beat runs with 4% burden. PACs with 1% burden. No pauses or atrial fibrillation  2D Doppler echocardiogram at [facility] on 14/02/8184 Mild LV systolic dysfunction with LVH estimated EF 50% Normal right ventricular systolic function Trivial aortic, trivial pulmonic, trivial tricuspid regurgitation.   Mild to moderate mitral regurgitation. [employer] evolute R TAVR valve 23 mm with  full leaflet mobility and no significant gradient or regurgitation.  EKG:  EKG the tracing performed in September 2022 demonstrated sinus bradycardia, atrial tracking, ventricular pacing, and PVCs.  Recent Labs: 06/24/2021: Hemoglobin 14.0; Magnesium 2.3; Platelets 140 08/13/2021: BUN 21; Creatinine, Ser 1.18; Potassium 4.5; Sodium 140  Recent Lipid Panel    Component Value Date/Time   CHOL 90 04/23/2019 0249   TRIG 79 04/23/2019 0249   HDL 32 (L) 04/23/2019 0249   CHOLHDL 2.8 04/23/2019 0249   VLDL 16 04/23/2019 0249   LDLCALC 42 04/23/2019 0249    Physical Exam:    VS:  BP (!) 108/58    Pulse (!) 57    Ht 5\' 10"  (1.778 m)    Wt 218 lb 3.2 oz (99 kg)    SpO2 97%    BMI 31.31 kg/m     Wt Readings from Last 3 Encounters:  01/07/22 218 lb 3.2 oz (99 kg)  10/30/21 222 lb 9.6 oz (101 kg)  08/28/21 228 lb 2.8 oz (103.5 kg)     GEN: Relatively low blood pressure.  Weight is down slightly compared to prior.  Down 10 pounds since October.. No acute distress HEENT: Normal NECK: No JVD. LYMPHATICS: No lymphadenopathy CARDIAC: 2/6 right upper sternal systolic murmur without diastolic murmur. RRR no gallop, or edema. VASCULAR:  Normal Pulses. No bruits. RESPIRATORY:  Clear to auscultation without rales, wheezing or rhonchi  ABDOMEN: Soft, non-tender, non-distended, No pulsatile mass, MUSCULOSKELETAL: No deformity  SKIN: Warm and dry NEUROLOGIC:  Alert and oriented x 3 PSYCHIATRIC:  Normal affect   ASSESSMENT:    1. Coronary artery disease of native artery of native heart with stable angina pectoris ([place])   2. Chronic diastolic heart failure (HCC)   3. Other hyperlipidemia   4. Bilateral carotid artery occlusion   5. Essential hypertension   6. History of transcatheter aortic valve replacement (TAVR)   7. Orthostatic dizziness    PLAN:    In order of problems listed above:  He is having stable angina.  He is on long-acting nitrates.  I would like to decrease or eliminate  nitrates if possible as it might help to decrease the orthostatic dizziness that he feels.  The other contributing medication to orthostatic dizziness is tamsulosin.  Use sublingual nitro as needed. Continue diuretic therapy.  Consider SGLT2  therapy if needed. Continue Crestor 10 mg/day. Continue secondary risk prevention measures. Current excellent blood pressure control on amlodipine, furosemide, Imdur, Lopressor, and tamsulosin. Valve function is normal after the TAVR in TAVR procedure. Would like to decrease or discontinue tamsulosin.  If this is not possible consider decreasing and or discontinuing isosorbide if tolerable relative to angina burden.   Overall education and awareness concerning secondary risk prevention was discussed in detail: LDL less than 70, hemoglobin A1c less than 7, blood pressure target less than 130/80 mmHg, >150 minutes of moderate aerobic activity per week, avoidance of smoking, weight control (via diet and exercise), and continued surveillance/management of/for obstructive sleep apnea.    Medication Adjustments/Labs and Tests Ordered: Current medicines are reviewed at length with the patient today.  Concerns regarding medicines are outlined above.  No orders of the defined types were placed in this encounter.  No orders of the defined types were placed in this encounter.   Patient Instructions  Medication Instructions:  Your physician recommends that you continue on your current medications as directed. Please refer to the Current Medication list given to you today.  *If you need a refill on your cardiac medications before your next appointment, please call your pharmacy*   Lab Work: NONE If you have labs (blood work) drawn today and your tests are completely normal, you will receive your results only by: [place] (if you have MyChart) OR A paper copy in the mail If you have any lab test that is abnormal or we need to change your treatment, we  will call you to review the results.   Testing/Procedures: NONE   Follow-Up: At [facility], you and your health needs are our priority.  As part of our continuing mission to provide you with exceptional heart care, we have created designated Provider Care Teams.  These Care Teams include your primary Cardiologist (physician) and Advanced Practice Providers (APPs -  Physician Assistants and Nurse Practitioners) who all work together to provide you with the care you need, when you need it.  We recommend signing up for the patient portal called "MyChart".  Sign up information is provided on this After Visit Summary.  MyChart is used to connect with patients for Virtual Visits (Telemedicine).  Patients are able to view lab/test results, encounter notes, upcoming appointments, etc.  Non-urgent messages can be sent to your provider as well.   To learn more about what you can do with MyChart, go to [email or web address].    Your next appointment:   6 month(s)  The format for your next appointment:   In Person  Provider:   Sinclair Grooms, MD    Signed, Sinclair Grooms, MD  01/07/2022 11:12 AM    [place]

## 2022-01-07 ENCOUNTER — Other Ambulatory Visit: Payer: Self-pay

## 2022-01-07 ENCOUNTER — Ambulatory Visit (INDEPENDENT_AMBULATORY_CARE_PROVIDER_SITE_OTHER): Payer: Medicare Other | Admitting: Interventional Cardiology

## 2022-01-07 ENCOUNTER — Encounter: Payer: Self-pay | Admitting: Interventional Cardiology

## 2022-01-07 VITALS — BP 108/58 | HR 57 | Ht 70.0 in | Wt 218.2 lb

## 2022-01-07 DIAGNOSIS — Z952 Presence of prosthetic heart valve: Secondary | ICD-10-CM | POA: Diagnosis not present

## 2022-01-07 DIAGNOSIS — I10 Essential (primary) hypertension: Secondary | ICD-10-CM

## 2022-01-07 DIAGNOSIS — R42 Dizziness and giddiness: Secondary | ICD-10-CM

## 2022-01-07 DIAGNOSIS — I25118 Atherosclerotic heart disease of native coronary artery with other forms of angina pectoris: Secondary | ICD-10-CM

## 2022-01-07 DIAGNOSIS — I6523 Occlusion and stenosis of bilateral carotid arteries: Secondary | ICD-10-CM

## 2022-01-07 DIAGNOSIS — I5032 Chronic diastolic (congestive) heart failure: Secondary | ICD-10-CM

## 2022-01-07 DIAGNOSIS — E7849 Other hyperlipidemia: Secondary | ICD-10-CM | POA: Diagnosis not present

## 2022-01-07 DIAGNOSIS — I351 Nonrheumatic aortic (valve) insufficiency: Secondary | ICD-10-CM

## 2022-01-07 NOTE — Patient Instructions (Signed)
Medication Instructions:  Your physician recommends that you continue on your current medications as directed. Please refer to the Current Medication list given to you today.  *If you need a refill on your cardiac medications before your next appointment, please call your pharmacy*   Lab Work: NONE If you have labs (blood work) drawn today and your tests are completely normal, you will receive your results only by: [place] (if you have MyChart) OR A paper copy in the mail If you have any lab test that is abnormal or we need to change your treatment, we will call you to review the results.   Testing/Procedures: NONE   Follow-Up: At [facility], you and your health needs are our priority.  As part of our continuing mission to provide you with exceptional heart care, we have created designated Provider Care Teams.  These Care Teams include your primary Cardiologist (physician) and Advanced Practice Providers (APPs -  Physician Assistants and Nurse Practitioners) who all work together to provide you with the care you need, when you need it.  We recommend signing up for the patient portal called "MyChart".  Sign up information is provided on this After Visit Summary.  MyChart is used to connect with patients for Virtual Visits (Telemedicine).  Patients are able to view lab/test results, encounter notes, upcoming appointments, etc.  Non-urgent messages can be sent to your provider as well.   To learn more about what you can do with MyChart, go to [email or web address].    Your next appointment:   6 month(s)  The format for your next appointment:   In Person  Provider:   Sinclair Grooms, MD

## 2022-01-10 ENCOUNTER — Telehealth: Payer: Self-pay | Admitting: Internal Medicine

## 2022-01-10 NOTE — Telephone Encounter (Signed)
Looked at [employer] in [place], his information was last updated in 2019. Will need to call [employer] on Monday since it is now after 5pm.

## 2022-01-21 DIAGNOSIS — Z86010 Personal history of colonic polyps: Secondary | ICD-10-CM | POA: Diagnosis not present

## 2022-01-21 DIAGNOSIS — R634 Abnormal weight loss: Secondary | ICD-10-CM | POA: Diagnosis not present

## 2022-01-21 NOTE — Telephone Encounter (Signed)
I am not sure what the pt is needing exactly- did he ever get his CPAP from [employer]?? LMTCB for the pt.  ?

## 2022-01-21 NOTE — Telephone Encounter (Signed)
Spoke with the pt  ?He says he received his new CPAP in the mail and wants to make sure settings are correct ?I called [employer] and spoke with Pam  ?She states that the settings are auto 5-15  ?This is what we had ordered  ?Pt aware and nothing further needed ?

## 2022-01-22 ENCOUNTER — Other Ambulatory Visit: Payer: Self-pay | Admitting: Gastroenterology

## 2022-01-23 DIAGNOSIS — Z950 Presence of cardiac pacemaker: Secondary | ICD-10-CM | POA: Diagnosis not present

## 2022-01-27 ENCOUNTER — Other Ambulatory Visit: Payer: Self-pay | Admitting: Gastroenterology

## 2022-02-05 DIAGNOSIS — D2371 Other benign neoplasm of skin of right lower limb, including hip: Secondary | ICD-10-CM | POA: Diagnosis not present

## 2022-02-05 DIAGNOSIS — Z85828 Personal history of other malignant neoplasm of skin: Secondary | ICD-10-CM | POA: Diagnosis not present

## 2022-02-05 DIAGNOSIS — L570 Actinic keratosis: Secondary | ICD-10-CM | POA: Diagnosis not present

## 2022-02-05 DIAGNOSIS — D692 Other nonthrombocytopenic purpura: Secondary | ICD-10-CM | POA: Diagnosis not present

## 2022-02-05 DIAGNOSIS — L821 Other seborrheic keratosis: Secondary | ICD-10-CM | POA: Diagnosis not present

## 2022-02-05 DIAGNOSIS — L720 Epidermal cyst: Secondary | ICD-10-CM | POA: Diagnosis not present

## 2022-02-06 ENCOUNTER — Telehealth: Payer: Self-pay | Admitting: *Deleted

## 2022-02-06 NOTE — Telephone Encounter (Signed)
? ?  Pre-operative Risk Assessment  ?  ?Patient Name: Andre Miranda  ?DOB: 1935-11-23 ?MRN: [id number]  ? ?  ? ?Request for Surgical Clearance   ? ?Procedure:   COLONOSCOPY ? ?Date of Surgery:  Clearance 05/06/22                              ?   ?Surgeon:  DR. PARAG BRAHMBHATT ?Surgeon's Group or Practice Name:  [facility] ?Phone number:  [phone] ?Fax number:  [phone] ?  ?Type of Clearance Requested:   ?- Medical  ?- Pharmacy:  Hold Aspirin and Clopidogrel (Plavix)   ?  ?Type of Anesthesia:   PROPOFOL ?  ?Additional requests/questions:   ? ?Signed, ?Julaine Hua   ?02/06/2022, 6:16 PM  ? ?

## 2022-02-07 DIAGNOSIS — S0592XD Unspecified injury of left eye and orbit, subsequent encounter: Secondary | ICD-10-CM | POA: Diagnosis not present

## 2022-02-07 DIAGNOSIS — H179 Unspecified corneal scar and opacity: Secondary | ICD-10-CM | POA: Diagnosis not present

## 2022-02-07 DIAGNOSIS — W228XXD Striking against or struck by other objects, subsequent encounter: Secondary | ICD-10-CM | POA: Diagnosis not present

## 2022-02-07 DIAGNOSIS — Z79899 Other long term (current) drug therapy: Secondary | ICD-10-CM | POA: Diagnosis not present

## 2022-02-07 DIAGNOSIS — Z961 Presence of intraocular lens: Secondary | ICD-10-CM | POA: Diagnosis not present

## 2022-02-07 NOTE — Telephone Encounter (Signed)
? ?  Primary Cardiologist: Andre Grooms, MD ? ?Chart reviewed as part of pre-operative protocol coverage. Given past medical history and time since last visit, based on ACC/AHA guidelines, Andre Miranda would be at acceptable risk for the planned procedure without further cardiovascular testing.  ? ?Per Dr. Tamala Julian ? ?His aspirin and Plavix may be held for 72 hours prior to his procedure.  Please resume as soon as hemostasis is achieved. ? ?I will route this recommendation to the requesting party via Epic fax function and remove from pre-op pool. ? ?Please call with questions. ? ?Jossie Ng. Donnisha Besecker NP-C ? ?  ?02/07/2022, 9:01 AM ?[place] ?[address] ?Office [phone] Fax [phone] ? ? ? ? ?

## 2022-02-07 NOTE — Telephone Encounter (Signed)
Andre Miranda "Andre Miranda" 86 year old male is requesting preoperative cardiac evaluation for colonoscopy.  He was last seen in the clinic on 01/07/2022.  He was noted to have stable angina at that time.  His blood pressure was well controlled.  Follow-up was planned for 6 months. ? ? ?His PMH includes CAD with prior CABG(LIMA LAD, RIMA RCA, & SVG RI/OM in 2003, unstable angina treated with stent to SVG RI/OM 03/2021, aortic stenosis s/p TAVR 2016 at [facility], subsequent severe aortic regurgitation --> repeat TAVR prosthesis 10/12/2019, hyperlipidemia, prior difficult to control HTN, and chronic diastolic HF.  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  Acute on chronic diastolic heart failure resolved after repeat TAVR. ?  ? ?May his aspirin and Plavix be held prior to his procedure? ? ?Thank you for your help.  Please direct your response to CV DIV preop pool. ? ? ?Jossie Ng. Renato Spellman NP-C ? ?  ?02/07/2022, 7:33 AM ?[place] ?[address] ?Office [phone] Fax [phone] ? ?

## 2022-02-14 ENCOUNTER — Other Ambulatory Visit: Payer: Self-pay | Admitting: Gastroenterology

## 2022-02-14 DIAGNOSIS — R634 Abnormal weight loss: Secondary | ICD-10-CM

## 2022-02-17 DIAGNOSIS — R634 Abnormal weight loss: Secondary | ICD-10-CM | POA: Diagnosis not present

## 2022-02-17 DIAGNOSIS — I2510 Atherosclerotic heart disease of native coronary artery without angina pectoris: Secondary | ICD-10-CM | POA: Diagnosis not present

## 2022-02-17 DIAGNOSIS — I10 Essential (primary) hypertension: Secondary | ICD-10-CM | POA: Diagnosis not present

## 2022-02-17 DIAGNOSIS — C61 Malignant neoplasm of prostate: Secondary | ICD-10-CM | POA: Diagnosis not present

## 2022-02-17 DIAGNOSIS — I110 Hypertensive heart disease with heart failure: Secondary | ICD-10-CM | POA: Diagnosis not present

## 2022-02-17 DIAGNOSIS — R911 Solitary pulmonary nodule: Secondary | ICD-10-CM | POA: Diagnosis not present

## 2022-02-17 DIAGNOSIS — E785 Hyperlipidemia, unspecified: Secondary | ICD-10-CM | POA: Diagnosis not present

## 2022-02-17 DIAGNOSIS — I350 Nonrheumatic aortic (valve) stenosis: Secondary | ICD-10-CM | POA: Diagnosis not present

## 2022-02-17 DIAGNOSIS — G4730 Sleep apnea, unspecified: Secondary | ICD-10-CM | POA: Diagnosis not present

## 2022-02-17 DIAGNOSIS — Z952 Presence of prosthetic heart valve: Secondary | ICD-10-CM | POA: Diagnosis not present

## 2022-02-17 DIAGNOSIS — D696 Thrombocytopenia, unspecified: Secondary | ICD-10-CM | POA: Diagnosis not present

## 2022-02-20 ENCOUNTER — Ambulatory Visit
Admission: RE | Admit: 2022-02-20 | Discharge: 2022-02-20 | Disposition: A | Discharge status: Home / Self Care | Payer: Medicare Other | Source: Ambulatory Visit | Attending: Gastroenterology | Admitting: Gastroenterology

## 2022-02-20 DIAGNOSIS — I700 Atherosclerosis of aorta: Secondary | ICD-10-CM | POA: Diagnosis not present

## 2022-02-20 DIAGNOSIS — K5730 Diverticulosis of large intestine without perforation or abscess without bleeding: Secondary | ICD-10-CM | POA: Diagnosis not present

## 2022-02-20 DIAGNOSIS — R634 Abnormal weight loss: Secondary | ICD-10-CM

## 2022-02-20 DIAGNOSIS — Z9049 Acquired absence of other specified parts of digestive tract: Secondary | ICD-10-CM | POA: Diagnosis not present

## 2022-02-20 MED ORDER — IOPAMIDOL (ISOVUE-300) INJECTION 61%
100.0000 mL | Freq: Once | INTRAVENOUS | Status: AC | PRN
  Administered 2022-02-20: 100 mL via INTRAVENOUS

## 2022-03-03 DIAGNOSIS — N5082 Scrotal pain: Secondary | ICD-10-CM | POA: Diagnosis not present

## 2022-03-10 ENCOUNTER — Encounter: Payer: Self-pay | Admitting: Interventional Cardiology

## 2022-03-10 NOTE — Progress Notes (Signed)
?Cardiology Office Note:   ? ?Date:  03/12/2022  ? ?ID:  Andre Miranda, DOB 1936/03/15, MRN [id number] ? ?PCP:  Crist Infante, MD  ?Cardiologist:  Sinclair Grooms, MD  ? ?Referring MD: Crist Infante, MD  ? ?No chief complaint on file. ? ? ?History of Present Illness:   ? ?Andre Miranda is a 86 y.o. male with a hx of CAD with prior CABG(LIMA LAD, RIMA RCA, & SVG RI/OM in 2003, unstable angina treated with stent to SVG RI/OM 03/2021, aortic stenosis s/p TAVR 2016 at [facility], subsequent severe aortic regurgitation --> repeat TAVR prosthesis 10/12/2019, hyperlipidemia, prior difficult to control HTN, and chronic diastolic HF.  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  Acute on chronic diastolic heart failure resolved after repeat TAVR. ? ?Weight loss of 12 pounds since December.. ? ?Having some lightheadedness, weakness, and shortness of breath with activities.  Concomitant blood pressure recordings have been in the 85/56 to 130/65 range.  Blood pressures tend to be better while sitting.  He has not fainted. ? ?No significant angina. ? ?Past Medical History:  ?Diagnosis Date  ? Aortic stenosis   ? MODERATE  ? Cancer [facility])   ? prostate cancer-radiation  ? Carotid arterial disease ([place])   ? Coronary artery disease   ? Dysuria   ? GERD (gastroesophageal reflux disease)   ? Gout   ? Hematuria   ? History of urinary retention   ? Hyperlipidemia   ? Hypertension   ? Obesity   ? Sleep apnea   ? uses CPAP nightly  ? ? ?Past Surgical History:  ?Procedure Laterality Date  ? AORTIC VALVE REPAIR  02/06/2015  ? Dr. Aline Brochure and Dr. Ysidro Evert  at [facility]  ? BIOPSY  09/26/2019  ? Procedure: BIOPSY;  Surgeon: Otis Brace, MD;  Location: [place] ENDOSCOPY;  Service: Gastroenterology;;  ? CAROTID ENDARTERECTOMY  02/16/2008  ? CHOLECYSTECTOMY  04/17/2002  ? COLONOSCOPY WITH PROPOFOL Left 09/26/2019  ? Procedure: COLONOSCOPY WITH PROPOFOL;  Surgeon: Otis Brace, MD;  Location: [place];  Service:  Gastroenterology;  Laterality: Left;  ? CORONARY ARTERY BYPASS GRAFT  11/17/2001  ? LIMA TO THE LAD, RIMA TO THE RCA, AND A SAPHENOUS VEIN GRAFT TO THE INTERMEDIATE AND DISTAL LEFT CIRCUMFLEX  ? CORONARY STENT INTERVENTION N/A 04/03/2021  ? Procedure: CORONARY STENT INTERVENTION;  Surgeon: Belva Crome, MD;  Location: [place] CV LAB;  Service: Cardiovascular;  Laterality: N/A;  ? CORONARY/GRAFT ANGIOGRAPHY N/A 09/27/2019  ? Procedure: CORONARY/GRAFT ANGIOGRAPHY;  Surgeon: Belva Crome, MD;  Location: [place] CV LAB;  Service: Cardiovascular;  Laterality: N/A;  ? CORONARY/GRAFT ANGIOGRAPHY N/A 04/03/2021  ? Procedure: CORONARY/GRAFT ANGIOGRAPHY;  Surgeon: Belva Crome, MD;  Location: [place] CV LAB;  Service: Cardiovascular;  Laterality: N/A;  ? ESOPHAGOGASTRODUODENOSCOPY (EGD) WITH PROPOFOL Left 09/26/2019  ? Procedure: ESOPHAGOGASTRODUODENOSCOPY (EGD) WITH PROPOFOL;  Surgeon: Otis Brace, MD;  Location: [facility];  Service: Gastroenterology;  Laterality: Left;  ? INSERT / REPLACE / REMOVE PACEMAKER    ? ORIF ANKLE FRACTURE Right 02/14/2019  ? Procedure: OPEN REDUCTION INTERNAL FIXATION (ORIF) ANKLE FRACTURE;  Surgeon: Netta Cedars, MD;  Location: [place];  Service: Orthopedics;  Laterality: Right;  ? POLYPECTOMY  09/26/2019  ? Procedure: POLYPECTOMY;  Surgeon: Otis Brace, MD;  Location: [place] ENDOSCOPY;  Service: Gastroenterology;;  ? US ECHOCARDIOGRAPHY  08/17/2009  ? SHOWED A MEAN GRADIENT ACROSS IS AORTIC VALVE OF 24 MM OF MERCURY. HE HAD MODERATE LVH  AND NORMAL LV FUNCTION  ? ? ?Current Medications: ?Current Meds  ?Medication Sig  ? acetaminophen (TYLENOL) 500 MG tablet Take 500 mg by mouth every 6 (six) hours as needed for moderate pain.  ? allopurinol (ZYLOPRIM) 300 MG tablet Take 300 mg by mouth daily.  ? aspirin EC 81 MG tablet Take 81 mg by mouth at bedtime.   ? azithromycin (ZITHROMAX) 500 MG tablet Take 500 mg by mouth See admin instructions. Take 500 mg  1 hour prior to dental work  ? Cholecalciferol (VITAMIN D3) 1000 UNITS CAPS Take 1,000 Units by mouth at bedtime.  ? clopidogrel (PLAVIX) 75 MG tablet Take 1 tablet (75 mg total) by mouth daily with breakfast.  ? famotidine (PEPCID) 20 MG tablet Take 20 mg by mouth daily before breakfast.  ? furosemide (LASIX) 40 MG tablet TAKE 1 TABLET BY MOUTH EVERY DAY  ? isosorbide mononitrate (IMDUR) 60 MG 24 hr tablet Take 1 tablet (60 mg total) by mouth daily.  ? metoprolol tartrate (LOPRESSOR) 25 MG tablet Take 1 tablet (25 mg total) by mouth 2 (two) times daily.  ? miconazole (ZEASORB-AF) 2 % powder Apply 1 application topically as needed for itching.  ? Multiple Vitamins-Minerals (CENTRUM SILVER) tablet Take 1 tablet by mouth daily with breakfast.  ? nitroGLYCERIN (NITROSTAT) 0.4 MG SL tablet Place 0.4 mg under the tongue every 5 (five) minutes as needed for chest pain.  ? polyethylene glycol powder (GLYCOLAX/MIRALAX) 17 GM/SCOOP powder Take 17 g by mouth daily as needed for moderate constipation.  ? PRESCRIPTION MEDICATION CPAP: At bedtime  ? ramipril (ALTACE) 5 MG capsule Take 1 capsule (5 mg total) by mouth at bedtime.  ? rosuvastatin (CRESTOR) 10 MG tablet Take 10 mg by mouth every evening.  ? temazepam (RESTORIL) 15 MG capsule Take 15 mg by mouth at bedtime.  ? [DISCONTINUED] amLODipine (NORVASC) 5 MG tablet Take 5 mg by mouth daily.  ? [DISCONTINUED] isosorbide mononitrate (IMDUR) 60 MG 24 hr tablet Take 1.5 tablets (90 mg total) by mouth daily.  ?  ? ?Allergies:   Ibuprofen  ? ?Social History  ? ?Socioeconomic History  ? Marital status: Married  ?  Spouse name: Not on file  ? Number of children: 6  ? Years of education: 38  ? Highest education level: Bachelor's degree (e.g., BA, AB, BS)  ?Occupational History  ? Occupation: Retired  ?Tobacco Use  ? Smoking status: Former  ?  Packs/day: 1.50  ?  Years: 7.00  ?  Pack years: 10.50  ?  Types: Cigarettes  ?  Quit date: 04/03/1971  ?  Years since quitting: 50.9  ?  Smokeless tobacco: Never  ?Vaping Use  ? Vaping Use: Never used  ?Substance and Sexual Activity  ? Alcohol use: Yes  ?  Alcohol/week: 4.0 standard drinks  ?  Types: 4 Cans of beer per week  ?  Comment: social  ? Drug use: No  ? Sexual activity: Not on file  ?Other Topics Concern  ? Not on file  ?Social History Narrative  ? Not on file  ? ?Social Determinants of Health  ? ?Financial Resource Strain: Not on file  ?Food Insecurity: Not on file  ?Transportation Needs: Not on file  ?Physical Activity: Not on file  ?Stress: Not on file  ?Social Connections: Not on file  ?  ? ?Family History: ?The patient's family history includes Heart attack in his father; Pneumonia (age of onset: 36) in his mother. ? ?ROS:   ?Please see the  history of present illness.    ?Decreased appetite, and great concern that this is causing weight loss.  He has an upcoming colonoscopy.  All other systems reviewed and are negative. ? ?EKGs/Labs/Other Studies Reviewed:   ? ?The following studies were reviewed today: ?No new or recent imaging from cardiac standpoint. ? ?Did have chest and abdominal CT scans performed without any specific diagnosis that would cause loss of appetite or weight loss.  This is being managed by Dr. Joylene Draft. ? ?EKG:  EKG normal sinus rhythm, left bundle branch block, QRS D1 188 ms.  Heart rate 62.  When compared to September 2022, PVCs are no longer present. ? ?Recent Labs: ?06/24/2021: Hemoglobin 14.0; Magnesium 2.3; Platelets 140 ?08/13/2021: BUN 21; Creatinine, Ser 1.18; Potassium 4.5; Sodium 140  ?Recent Lipid Panel ?   ?Component Value Date/Time  ? CHOL 90 04/23/2019 0249  ? TRIG 79 04/23/2019 0249  ? HDL 32 (L) 04/23/2019 0249  ? CHOLHDL 2.8 04/23/2019 0249  ? VLDL 16 04/23/2019 0249  ? [place] 42 04/23/2019 0249  ? ? ?Physical Exam:   ? ?VS:  BP (!) 86/46   Pulse 62   Ht '5\' 10"'$  (1.778 m)   Wt 212 lb 6.4 oz (96.3 kg)   SpO2 98%   BMI 30.48 kg/m?    ? ?Wt Readings from Last 3 Encounters:  ?03/12/22 212 lb 6.4 oz (96.3  kg)  ?01/07/22 218 lb 3.2 oz (99 kg)  ?10/30/21 222 lb 9.6 oz (101 kg)  ?  ? ?GEN: Compatible with age. No acute distress ?HEENT: Normal ?NECK: No JVD. ?LYMPHATICS: No lymphadenopathy ?CARDIAC: 2/6 crescendo decr

## 2022-03-11 ENCOUNTER — Encounter (HOSPITAL_COMMUNITY): Payer: Self-pay | Admitting: Gastroenterology

## 2022-03-12 ENCOUNTER — Encounter: Payer: Self-pay | Admitting: Interventional Cardiology

## 2022-03-12 ENCOUNTER — Ambulatory Visit (INDEPENDENT_AMBULATORY_CARE_PROVIDER_SITE_OTHER): Payer: Medicare Other | Admitting: Interventional Cardiology

## 2022-03-12 VITALS — BP 86/46 | HR 62 | Ht 70.0 in | Wt 212.4 lb

## 2022-03-12 DIAGNOSIS — E7849 Other hyperlipidemia: Secondary | ICD-10-CM | POA: Diagnosis not present

## 2022-03-12 DIAGNOSIS — Z952 Presence of prosthetic heart valve: Secondary | ICD-10-CM

## 2022-03-12 DIAGNOSIS — I25118 Atherosclerotic heart disease of native coronary artery with other forms of angina pectoris: Secondary | ICD-10-CM | POA: Diagnosis not present

## 2022-03-12 DIAGNOSIS — I5032 Chronic diastolic (congestive) heart failure: Secondary | ICD-10-CM | POA: Diagnosis not present

## 2022-03-12 DIAGNOSIS — I701 Atherosclerosis of renal artery: Secondary | ICD-10-CM | POA: Diagnosis not present

## 2022-03-12 DIAGNOSIS — I6523 Occlusion and stenosis of bilateral carotid arteries: Secondary | ICD-10-CM

## 2022-03-12 DIAGNOSIS — I10 Essential (primary) hypertension: Secondary | ICD-10-CM | POA: Diagnosis not present

## 2022-03-12 MED ORDER — ISOSORBIDE MONONITRATE ER 60 MG PO TB24: 60.0000 mg | ORAL_TABLET | Freq: Every day | ORAL | Status: DC

## 2022-03-12 NOTE — Patient Instructions (Signed)
Medication Instructions:  ?Your physician has recommended you make the following change in your medication:  ? ?1) STOP Amlodipine ?2) DECREASE Isosorbide mononitrate (Imdur) '60mg'$  1 tablet by mouth once daily ? ?*If you need a refill on your cardiac medications before your next appointment, please call your pharmacy* ? ?Lab Work: ?NONE ? ?Testing/Procedures: ?NONE ? ?Follow-Up: ?At [facility], you and your health needs are our priority.  As part of our continuing mission to provide you with exceptional heart care, we have created designated Provider Care Teams.  These Care Teams include your primary Cardiologist (physician) and Advanced Practice Providers (APPs -  Physician Assistants and Nurse Practitioners) who all work together to provide you with the care you need, when you need it. ? ?Your next appointment:   ?9 month(s) ? ?The format for your next appointment:   ?In Person ? ?Provider:   ?Sinclair Grooms, MD   ? ?Other Instructions ?Monitor your blood pressures. Check your blood pressure 2-6 hours after taking your morning medications so we can get an accurate view of how your medications are working to control your blood pressure. ? ?HOW TO TAKE YOUR BLOOD PRESSURE: ?Rest 5 minutes before taking your blood pressure. ? Don?t smoke or drink caffeinated beverages for at least 30 minutes before. ?Take your blood pressure before (not after) you eat. ?Sit comfortably with your back supported and both feet on the floor (don?t cross your legs). ?Elevate your arm to heart level on a table or a desk. ?Use the proper sized cuff. It should fit smoothly and snugly around your bare upper arm. There should be enough room to slip a fingertip under the cuff. The bottom edge of the cuff should be 1 inch above the crease of the elbow.  ? ?Important Information About Sugar ? ? ? ? ?  ?

## 2022-03-17 NOTE — H&P (Signed)
Andre Miranda is an 86 y.o. male.   ?Chief Complaint: Blood in stool, weight loss ? ?HPI: 86 year old male ?Colonoscopy 09/2019: Multiple adenomas removed, internal and external hemorrhoids noted, diverticulosis, fair prep, repeat recommended in 1 year ?EGD 09/2019: Unremarkable ? ?Past Medical History:  ?Diagnosis Date  ? Aortic stenosis   ? MODERATE  ? Cancer [facility])   ? prostate cancer-radiation  ? Carotid arterial disease ([place])   ? Coronary artery disease   ? Dysuria   ? GERD (gastroesophageal reflux disease)   ? Gout   ? Hematuria   ? History of urinary retention   ? Hyperlipidemia   ? Hypertension   ? Obesity   ? Sleep apnea   ? uses CPAP nightly  ? ? ?Past Surgical History:  ?Procedure Laterality Date  ? AORTIC VALVE REPAIR  02/06/2015  ? Dr. Aline Brochure and Dr. Ysidro Evert  at [facility]  ? BIOPSY  09/26/2019  ? Procedure: BIOPSY;  Surgeon: Otis Brace, MD;  Location: [place] ENDOSCOPY;  Service: Gastroenterology;;  ? CAROTID ENDARTERECTOMY  02/16/2008  ? CHOLECYSTECTOMY  04/17/2002  ? COLONOSCOPY WITH PROPOFOL Left 09/26/2019  ? Procedure: COLONOSCOPY WITH PROPOFOL;  Surgeon: Otis Brace, MD;  Location: [place];  Service: Gastroenterology;  Laterality: Left;  ? CORONARY ARTERY BYPASS GRAFT  11/17/2001  ? LIMA TO THE LAD, RIMA TO THE RCA, AND A SAPHENOUS VEIN GRAFT TO THE INTERMEDIATE AND DISTAL LEFT CIRCUMFLEX  ? CORONARY STENT INTERVENTION N/A 04/03/2021  ? Procedure: CORONARY STENT INTERVENTION;  Surgeon: Belva Crome, MD;  Location: [place] CV LAB;  Service: Cardiovascular;  Laterality: N/A;  ? CORONARY/GRAFT ANGIOGRAPHY N/A 09/27/2019  ? Procedure: CORONARY/GRAFT ANGIOGRAPHY;  Surgeon: Belva Crome, MD;  Location: [place] CV LAB;  Service: Cardiovascular;  Laterality: N/A;  ? CORONARY/GRAFT ANGIOGRAPHY N/A 04/03/2021  ? Procedure: CORONARY/GRAFT ANGIOGRAPHY;  Surgeon: Belva Crome, MD;  Location: [place] CV LAB;  Service: Cardiovascular;  Laterality: N/A;  ?  ESOPHAGOGASTRODUODENOSCOPY (EGD) WITH PROPOFOL Left 09/26/2019  ? Procedure: ESOPHAGOGASTRODUODENOSCOPY (EGD) WITH PROPOFOL;  Surgeon: Otis Brace, MD;  Location: [facility];  Service: Gastroenterology;  Laterality: Left;  ? INSERT / REPLACE / REMOVE PACEMAKER    ? ORIF ANKLE FRACTURE Right 02/14/2019  ? Procedure: OPEN REDUCTION INTERNAL FIXATION (ORIF) ANKLE FRACTURE;  Surgeon: Netta Cedars, MD;  Location: [place];  Service: Orthopedics;  Laterality: Right;  ? POLYPECTOMY  09/26/2019  ? Procedure: POLYPECTOMY;  Surgeon: Otis Brace, MD;  Location: [place] ENDOSCOPY;  Service: Gastroenterology;;  ? US ECHOCARDIOGRAPHY  08/17/2009  ? SHOWED A MEAN GRADIENT ACROSS IS AORTIC VALVE OF 24 MM OF MERCURY. HE HAD MODERATE LVH AND NORMAL LV FUNCTION  ? ? ?Family History  ?Problem Relation Age of Onset  ? Heart attack Father   ? Pneumonia Mother 73  ? ?Social History:  reports that he quit smoking about 50 years ago. His smoking use included cigarettes. He has a 10.50 pack-year smoking history. He has never used smokeless tobacco. He reports current alcohol use of about 4.0 standard drinks per week. He reports that he does not use drugs. ? ?Allergies:  ?Allergies  ?Allergen Reactions  ? Ibuprofen Hypertension  ? ? ?No medications prior to admission.  ? ? ?No results found for this or any previous visit (from the past 48 hour(s)). ?No results found. ? ?Review of Systems  ?Constitutional:  Positive for unexpected weight change.  ?Gastrointestinal: Negative.   ?Neurological:  Positive for dizziness.  ? ?There were no vitals  taken for this visit. ?Physical Exam ?Constitutional:   ?   Appearance: Normal appearance.  ?HENT:  ?   Head: Normocephalic and atraumatic.  ?Eyes:  ?   Conjunctiva/sclera: Conjunctivae normal.  ?Cardiovascular:  ?   Rate and Rhythm: Normal rate and regular rhythm.  ?Pulmonary:  ?   Effort: Pulmonary effort is normal.  ?   Breath sounds: Normal breath sounds.  ?Abdominal:  ?    General: Bowel sounds are normal.  ?   Palpations: Abdomen is soft.  ?Neurological:  ?   General: No focal deficit present.  ?   Mental Status: He is alert and oriented to person, place, and time.  ?Psychiatric:     ?   Mood and Affect: Mood normal.     ?   Behavior: Behavior normal.  ?  ? ?Assessment/Plan ?Blood in stool ?Weight loss ?Multiple adenomas ? ?Recommend diagnostic EGD and colonoscopy. ? ?Ronnette Juniper, MD ?03/17/2022, 3:01 PM ? ? ? ?

## 2022-03-17 NOTE — Anesthesia Preprocedure Evaluation (Addendum)
Anesthesia Evaluation  ?Patient identified by MRN, date of birth, ID band ?Patient awake ? ? ? ?Reviewed: ?Allergy & Precautions, NPO status , Patient's Chart, lab work & pertinent test results, reviewed documented beta blocker date and time  ? ?Airway ?Mallampati: III ? ?TM Distance: >3 FB ?Neck ROM: Full ? ? ? Dental ? ?(+) Teeth Intact, Dental Advisory Given ?  ?Pulmonary ?sleep apnea and Continuous Positive Airway Pressure Ventilation , former smoker,  ?Quit smoking 1972, 10 pack year history  ?  ?Pulmonary exam normal ?breath sounds clear to auscultation ? ? ? ? ? ? Cardiovascular ?hypertension (108/68 in preop), Pt. on medications and Pt. 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 ?Has been having low Bps, some orthostasis w/o syncope- saw cardiology 03/12/22: "Blood pressure is too low for age.  Initial blood pressure this morning was less than 90 systolic.  After sitting for a while he had registered 110/55 mmHg.  Discontinue amlodipine and along with Imdur decrease will help improve blood pressure to the target range of 140/80 mmHg." ? ? ?  ?Neuro/Psych ?negative neurological ROS ? negative psych ROS  ? GI/Hepatic ?Neg liver ROS, GERD  Medicated and Controlled,  ?Endo/Other  ?negative endocrine ROS ? Renal/GU ?negative Renal ROS  ?negative genitourinary ?  ?Musculoskeletal ?negative musculoskeletal ROS ?(+)  ? Abdominal ?  ?Peds ? Hematology ?negative hematology ROS ?(+)   ?Anesthesia Other Findings ? ? Reproductive/Obstetrics ?negative OB ROS ? ?   ? ? ? ? ? ? ? ? ? ? ? ? ? ?  ?  ? ? ? ? ? ? ? ?Anesthesia Physical ?Anesthesia Plan ? ?ASA: 3 ? ?Anesthesia Plan: MAC  ? ?Post-op Pain Management:   ? ?Induction:  ? ?PONV Risk Score and Plan: Propofol infusion, TIVA and Treatment may vary due to age or medical condition ? ?Airway Management Planned: Natural Airway and Nasal Cannula ? ?Additional Equipment: None ? ?Intra-op Plan:  ? ?Post-operative Plan:  ? ?Informed Consent: I have reviewed the patients History and Physical, chart, labs and discussed the procedure including the risks, benefits and alternatives for the proposed anesthesia with the patient or authorized representative who has indicated his/her understanding and acceptance.  ? ? ? ?Dental advisory given ? ?Plan Discussed with: CRNA ? ?Anesthesia Plan Comments:   ? ? ? ? ? ?Anesthesia Quick Evaluation ? ?

## 2022-03-18 ENCOUNTER — Ambulatory Visit (HOSPITAL_BASED_OUTPATIENT_CLINIC_OR_DEPARTMENT_OTHER): Payer: Medicare Other | Admitting: Certified Registered Nurse Anesthetist

## 2022-03-18 ENCOUNTER — Encounter (HOSPITAL_COMMUNITY): Payer: Self-pay | Admitting: Gastroenterology

## 2022-03-18 ENCOUNTER — Encounter (HOSPITAL_COMMUNITY)
Admission: RE | Disposition: A | Discharge status: Home / Self Care | Payer: Self-pay | Source: Home / Self Care | Attending: Gastroenterology

## 2022-03-18 ENCOUNTER — Ambulatory Visit (HOSPITAL_COMMUNITY): Payer: Medicare Other | Admitting: Certified Registered Nurse Anesthetist

## 2022-03-18 ENCOUNTER — Ambulatory Visit (HOSPITAL_COMMUNITY)
Admission: RE | Admit: 2022-03-18 | Discharge: 2022-03-18 | Disposition: A | Discharge status: Home / Self Care | Payer: Medicare Other | Attending: Gastroenterology | Admitting: Gastroenterology

## 2022-03-18 ENCOUNTER — Other Ambulatory Visit: Payer: Self-pay

## 2022-03-18 DIAGNOSIS — K5730 Diverticulosis of large intestine without perforation or abscess without bleeding: Secondary | ICD-10-CM | POA: Insufficient documentation

## 2022-03-18 DIAGNOSIS — K648 Other hemorrhoids: Secondary | ICD-10-CM | POA: Diagnosis not present

## 2022-03-18 DIAGNOSIS — K219 Gastro-esophageal reflux disease without esophagitis: Secondary | ICD-10-CM | POA: Insufficient documentation

## 2022-03-18 DIAGNOSIS — G4733 Obstructive sleep apnea (adult) (pediatric): Secondary | ICD-10-CM | POA: Diagnosis not present

## 2022-03-18 DIAGNOSIS — Z86010 Personal history of colonic polyps: Secondary | ICD-10-CM | POA: Insufficient documentation

## 2022-03-18 DIAGNOSIS — D122 Benign neoplasm of ascending colon: Secondary | ICD-10-CM | POA: Diagnosis not present

## 2022-03-18 DIAGNOSIS — I2510 Atherosclerotic heart disease of native coronary artery without angina pectoris: Secondary | ICD-10-CM | POA: Diagnosis not present

## 2022-03-18 DIAGNOSIS — Z7902 Long term (current) use of antithrombotics/antiplatelets: Secondary | ICD-10-CM | POA: Diagnosis not present

## 2022-03-18 DIAGNOSIS — Z951 Presence of aortocoronary bypass graft: Secondary | ICD-10-CM | POA: Insufficient documentation

## 2022-03-18 DIAGNOSIS — K254 Chronic or unspecified gastric ulcer with hemorrhage: Secondary | ICD-10-CM

## 2022-03-18 DIAGNOSIS — Z955 Presence of coronary angioplasty implant and graft: Secondary | ICD-10-CM | POA: Insufficient documentation

## 2022-03-18 DIAGNOSIS — Z9989 Dependence on other enabling machines and devices: Secondary | ICD-10-CM | POA: Diagnosis not present

## 2022-03-18 DIAGNOSIS — K921 Melena: Secondary | ICD-10-CM | POA: Insufficient documentation

## 2022-03-18 DIAGNOSIS — K3189 Other diseases of stomach and duodenum: Secondary | ICD-10-CM | POA: Insufficient documentation

## 2022-03-18 DIAGNOSIS — R634 Abnormal weight loss: Secondary | ICD-10-CM | POA: Diagnosis not present

## 2022-03-18 DIAGNOSIS — D123 Benign neoplasm of transverse colon: Secondary | ICD-10-CM | POA: Diagnosis not present

## 2022-03-18 DIAGNOSIS — I509 Heart failure, unspecified: Secondary | ICD-10-CM | POA: Diagnosis not present

## 2022-03-18 DIAGNOSIS — G4730 Sleep apnea, unspecified: Secondary | ICD-10-CM | POA: Insufficient documentation

## 2022-03-18 DIAGNOSIS — K635 Polyp of colon: Secondary | ICD-10-CM

## 2022-03-18 DIAGNOSIS — Z87891 Personal history of nicotine dependence: Secondary | ICD-10-CM | POA: Insufficient documentation

## 2022-03-18 DIAGNOSIS — D120 Benign neoplasm of cecum: Secondary | ICD-10-CM | POA: Insufficient documentation

## 2022-03-18 DIAGNOSIS — Z79899 Other long term (current) drug therapy: Secondary | ICD-10-CM | POA: Insufficient documentation

## 2022-03-18 DIAGNOSIS — I110 Hypertensive heart disease with heart failure: Secondary | ICD-10-CM | POA: Insufficient documentation

## 2022-03-18 DIAGNOSIS — K259 Gastric ulcer, unspecified as acute or chronic, without hemorrhage or perforation: Secondary | ICD-10-CM | POA: Insufficient documentation

## 2022-03-18 DIAGNOSIS — D175 Benign lipomatous neoplasm of intra-abdominal organs: Secondary | ICD-10-CM | POA: Diagnosis not present

## 2022-03-18 DIAGNOSIS — Z950 Presence of cardiac pacemaker: Secondary | ICD-10-CM | POA: Insufficient documentation

## 2022-03-18 DIAGNOSIS — K31819 Angiodysplasia of stomach and duodenum without bleeding: Secondary | ICD-10-CM | POA: Diagnosis not present

## 2022-03-18 HISTORY — PX: COLONOSCOPY WITH PROPOFOL: SHX5780

## 2022-03-18 HISTORY — PX: ESOPHAGOGASTRODUODENOSCOPY (EGD) WITH PROPOFOL: SHX5813

## 2022-03-18 HISTORY — PX: POLYPECTOMY: SHX5525

## 2022-03-18 HISTORY — PX: BIOPSY: SHX5522

## 2022-03-18 SURGERY — COLONOSCOPY WITH PROPOFOL
Anesthesia: Monitor Anesthesia Care

## 2022-03-18 MED ORDER — PHENYLEPHRINE HCL (PRESSORS) 10 MG/ML IV SOLN
INTRAVENOUS | Status: AC
  Filled 2022-03-18: qty 1

## 2022-03-18 MED ORDER — LACTATED RINGERS IV SOLN
INTRAVENOUS | Status: AC | PRN
  Administered 2022-03-18: 10 mL/h via INTRAVENOUS

## 2022-03-18 MED ORDER — SODIUM CHLORIDE 0.9 % IV SOLN: INTRAVENOUS | Status: DC

## 2022-03-18 MED ORDER — PHENYLEPHRINE HCL-NACL 20-0.9 MG/250ML-% IV SOLN
INTRAVENOUS | Status: DC | PRN
  Administered 2022-03-18: 40 ug/min via INTRAVENOUS

## 2022-03-18 MED ORDER — PROPOFOL 10 MG/ML IV BOLUS
INTRAVENOUS | Status: DC | PRN
Start: 2022-03-18 — End: 2022-03-18
  Administered 2022-03-18 (×2): 30 mg via INTRAVENOUS

## 2022-03-18 MED ORDER — PANTOPRAZOLE SODIUM 40 MG PO TBEC: 40.0000 mg | DELAYED_RELEASE_TABLET | Freq: Two times a day (BID) | ORAL | 1 refills | Status: AC

## 2022-03-18 MED ORDER — EPHEDRINE SULFATE-NACL 50-0.9 MG/10ML-% IV SOSY
PREFILLED_SYRINGE | INTRAVENOUS | Status: DC | PRN
  Administered 2022-03-18: 5 mg via INTRAVENOUS

## 2022-03-18 MED ORDER — LACTATED RINGERS IV SOLN
INTRAVENOUS | Status: DC
Start: 2022-03-18 — End: 2022-03-18

## 2022-03-18 MED ORDER — PHENYLEPHRINE 80 MCG/ML (10ML) SYRINGE FOR IV PUSH (FOR BLOOD PRESSURE SUPPORT)
PREFILLED_SYRINGE | INTRAVENOUS | Status: DC | PRN
  Administered 2022-03-18: 80 ug via INTRAVENOUS

## 2022-03-18 MED ORDER — PROPOFOL 500 MG/50ML IV EMUL
INTRAVENOUS | Status: DC | PRN
Start: 2022-03-18 — End: 2022-03-18
  Administered 2022-03-18: 100 ug/kg/min via INTRAVENOUS

## 2022-03-18 MED ORDER — LIDOCAINE 2% (20 MG/ML) 5 ML SYRINGE
INTRAMUSCULAR | Status: DC | PRN
  Administered 2022-03-18: 60 mg via INTRAVENOUS

## 2022-03-18 MED ORDER — PROPOFOL 500 MG/50ML IV EMUL
INTRAVENOUS | Status: AC
  Filled 2022-03-18: qty 50

## 2022-03-18 SURGICAL SUPPLY — 25 items

## 2022-03-18 NOTE — Discharge Instructions (Signed)
YOU HAD AN ENDOSCOPIC PROCEDURE TODAY: Refer to the procedure report and other information in the discharge instructions given to you for any specific questions about what was found during the examination. If this information does not answer your questions, please call [facility] office at [phone] to clarify.  ? ?YOU SHOULD EXPECT: Some feelings of bloating in the abdomen. Passage of more gas than usual. Walking can help get rid of the air that was put into your GI tract during the procedure and reduce the bloating. If you had a lower endoscopy (such as a colonoscopy or flexible sigmoidoscopy) you may notice spotting of blood in your stool or on the toilet paper. Some abdominal soreness may be present for a day or two, also. ? ?DIET: Your first meal following the procedure should be a light meal and then it is ok to progress to your normal diet. A half-sandwich or bowl of soup is an example of a good first meal. Heavy or fried foods are harder to digest and may make you feel nauseous or bloated. Drink plenty of fluids but you should avoid alcoholic beverages for 24 hours. If you had a esophageal dilation, please see attached instructions for diet.   ? ?ACTIVITY: Your care partner should take you home directly after the procedure. You should plan to take it easy, moving slowly for the rest of the day. You can resume normal activity the day after the procedure however YOU SHOULD NOT DRIVE, use power tools, machinery or perform tasks that involve climbing or major physical exertion for 24 hours (because of the sedation medicines used during the test).  ? ?SYMPTOMS TO REPORT IMMEDIATELY: ?A gastroenterologist can be reached at any hour. Please call [phone]  for any of the following symptoms:  ?Following lower endoscopy (colonoscopy, flexible sigmoidoscopy) ?Excessive amounts of blood in the stool  ?Significant tenderness, worsening of abdominal pains  ?Swelling of the abdomen that is new, acute  ?Fever of 100?  or higher  ?Following upper endoscopy (EGD, EUS, ERCP, esophageal dilation) ?Vomiting of blood or coffee ground material  ?New, significant abdominal pain  ?New, significant chest pain or pain under the shoulder blades  ?Painful or persistently difficult swallowing  ?New shortness of breath  ?Black, tarry-looking or red, bloody stools ? ?FOLLOW UP:  ?If any biopsies were taken you will be contacted by phone or by letter within the next 1-3 weeks. Call [phone]  if you have not heard about the biopsies in 3 weeks.  ?Please also call with any specific questions about appointments or follow up tests. YOU HAD AN ENDOSCOPIC PROCEDURE TODAY: Refer to the procedure report and other information in the discharge instructions given to you for any specific questions about what was found during the examination. If this information does not answer your questions, please call [facility] office at [phone] to clarify.  ? ?YOU SHOULD EXPECT: Some feelings of bloating in the abdomen. Passage of more gas than usual. Walking can help get rid of the air that was put into your GI tract during the procedure and reduce the bloating. If you had a lower endoscopy (such as a colonoscopy or flexible sigmoidoscopy) you may notice spotting of blood in your stool or on the toilet paper. Some abdominal soreness may be present for a day or two, also. ? ?DIET: Your first meal following the procedure should be a light meal and then it is ok to progress to your normal diet. A half-sandwich or bowl of soup is an  example of a good first meal. Heavy or fried foods are harder to digest and may make you feel nauseous or bloated. Drink plenty of fluids but you should avoid alcoholic beverages for 24 hours. If you had a esophageal dilation, please see attached instructions for diet.   ? ?ACTIVITY: Your care partner should take you home directly after the procedure. You should plan to take it easy, moving slowly for the rest of the day. You can resume  normal activity the day after the procedure however YOU SHOULD NOT DRIVE, use power tools, machinery or perform tasks that involve climbing or major physical exertion for 24 hours (because of the sedation medicines used during the test).  ? ?SYMPTOMS TO REPORT IMMEDIATELY: ?A gastroenterologist can be reached at any hour. Please call [phone]  for any of the following symptoms:  ?Following lower endoscopy (colonoscopy, flexible sigmoidoscopy) ?Excessive amounts of blood in the stool  ?Significant tenderness, worsening of abdominal pains  ?Swelling of the abdomen that is new, acute  ?Fever of 100? or higher  ?Following upper endoscopy (EGD, EUS, ERCP, esophageal dilation) ?Vomiting of blood or coffee ground material  ?New, significant abdominal pain  ?New, significant chest pain or pain under the shoulder blades  ?Painful or persistently difficult swallowing  ?New shortness of breath  ?Black, tarry-looking or red, bloody stools ? ?FOLLOW UP:  ?If any biopsies were taken you will be contacted by phone or by letter within the next 1-3 weeks. Call [phone]  if you have not heard about the biopsies in 3 weeks.  ?Please also call with any specific questions about appointments or follow up tests. YOU HAD AN ENDOSCOPIC PROCEDURE TODAY: Refer to the procedure report and other information in the discharge instructions given to you for any specific questions about what was found during the examination. If this information does not answer your questions, please call [facility] office at [phone] to clarify.  ? ?YOU SHOULD EXPECT: Some feelings of bloating in the abdomen. Passage of more gas than usual. Walking can help get rid of the air that was put into your GI tract during the procedure and reduce the bloating. If you had a lower endoscopy (such as a colonoscopy or flexible sigmoidoscopy) you may notice spotting of blood in your stool or on the toilet paper. Some abdominal soreness may be present for a day or two,  also. ? ?DIET: Your first meal following the procedure should be a light meal and then it is ok to progress to your normal diet. A half-sandwich or bowl of soup is an example of a good first meal. Heavy or fried foods are harder to digest and may make you feel nauseous or bloated. Drink plenty of fluids but you should avoid alcoholic beverages for 24 hours. If you had a esophageal dilation, please see attached instructions for diet.   ? ?ACTIVITY: Your care partner should take you home directly after the procedure. You should plan to take it easy, moving slowly for the rest of the day. You can resume normal activity the day after the procedure however YOU SHOULD NOT DRIVE, use power tools, machinery or perform tasks that involve climbing or major physical exertion for 24 hours (because of the sedation medicines used during the test).  ? ?SYMPTOMS TO REPORT IMMEDIATELY: ?A gastroenterologist can be reached at any hour. Please call [phone]  for any of the following symptoms:  ?Following lower endoscopy (colonoscopy, flexible sigmoidoscopy) ?Excessive amounts of blood in the stool  ?Significant tenderness,  worsening of abdominal pains  ?Swelling of the abdomen that is new, acute  ?Fever of 100? or higher  ?Following upper endoscopy (EGD, EUS, ERCP, esophageal dilation) ?Vomiting of blood or coffee ground material  ?New, significant abdominal pain  ?New, significant chest pain or pain under the shoulder blades  ?Painful or persistently difficult swallowing  ?New shortness of breath  ?Black, tarry-looking or red, bloody stools ? ?FOLLOW UP:  ?If any biopsies were taken you will be contacted by phone or by letter within the next 1-3 weeks. Call [phone]  if you have not heard about the biopsies in 3 weeks.  ?Please also call with any specific questions about appointments or follow up tests. YOU HAD AN ENDOSCOPIC PROCEDURE TODAY: Refer to the procedure report and other information in the discharge instructions  given to you for any specific questions about what was found during the examination. If this information does not answer your questions, please call [facility] office at [phone] to clarify.  ? ?YOU SHO

## 2022-03-18 NOTE — Anesthesia Postprocedure Evaluation (Signed)
Anesthesia Post Note ? ?Patient: Andre Miranda ? ?Procedure(s) Performed: COLONOSCOPY WITH PROPOFOL ?ESOPHAGOGASTRODUODENOSCOPY (EGD) WITH PROPOFOL ?BIOPSY ?POLYPECTOMY ? ?  ? ?Patient location during evaluation: PACU ?Anesthesia Type: MAC ?Level of consciousness: awake and alert ?Pain management: pain level controlled ?Vital Signs Assessment: post-procedure vital signs reviewed and stable ?Respiratory status: spontaneous breathing, nonlabored ventilation and respiratory function stable ?Cardiovascular status: blood pressure returned to baseline and stable ?Postop Assessment: no apparent nausea or vomiting ?Anesthetic complications: no ? ? ?No notable events documented. ? ?Last Vitals:  ?Vitals:  ? 03/18/22 0936 03/18/22 0940  ?BP: (!) 136/56 (!) 121/54  ?Pulse: 62 (!) 59  ?Resp: 19 19  ?Temp:    ?SpO2: 99% 95%  ?  ?Last Pain:  ?Vitals:  ? 03/18/22 0934  ?TempSrc: Temporal  ?PainSc:   ? ? ?  ?  ?  ?  ?  ?  ? ?Jarome Matin Demetrie Borge ? ? ? ? ?

## 2022-03-18 NOTE — Anesthesia Procedure Notes (Signed)
Procedure Name: [place] ?Date/Time: 03/18/2022 8:35 AM ?Performed by: Claudia Desanctis, CRNA ?Pre-anesthesia Checklist: Patient identified, Emergency Drugs available, Suction available and Patient being monitored ?Patient Re-evaluated:Patient Re-evaluated prior to induction ?Oxygen Delivery Method: Simple face mask ? ? ? ? ?

## 2022-03-18 NOTE — Op Note (Signed)
[facility] ?Patient Name: Andre Miranda ?Procedure Date: 03/18/2022 ?MRN: [id number] ?Attending MD: Ronnette Juniper , MD ?Date of Birth: [birth year] ?CSN: [id number] ?Age: 86 ?Admit Type: Inpatient ?Procedure:                Colonoscopy ?Indications:              Last colonoscopy: November 2020, Hematochezia,  ?                          Follow-up for history of adenomatous polyps in the  ?                          colon ?Providers:                Ronnette Juniper, MD, Ladoris Gene, RN ?Referring MD:             Elta Guadeloupe Perini,MD ?Medicines:                Monitored Anesthesia Care ?Complications:            No immediate complications. Estimated blood loss:  ?                          Minimal. ?Estimated Blood Loss:     Estimated blood loss was minimal. ?Procedure:                Pre-Anesthesia Assessment: ?                          - Prior to the procedure, a History and Physical  ?                          was performed, and patient medications and  ?                          allergies were reviewed. The patient's tolerance of  ?                          previous anesthesia was also reviewed. The risks  ?                          and benefits of the procedure and the sedation  ?                          options and risks were discussed with the patient.  ?                          All questions were answered, and informed consent  ?                          was obtained. Prior Anticoagulants: The patient has  ?                          taken Plavix (clopidogrel), last dose was 4 days  ?                          prior to  procedure. ASA Grade Assessment: III - A  ?                          patient with severe systemic disease. After  ?                          reviewing the risks and benefits, the patient was  ?                          deemed in satisfactory condition to undergo the  ?                          procedure. ?                          - Prior to the procedure, a History and Physical  ?                           was performed, and patient medications and  ?                          allergies were reviewed. The patient's tolerance of  ?                          previous anesthesia was also reviewed. The risks  ?                          and benefits of the procedure and the sedation  ?                          options and risks were discussed with the patient.  ?                          All questions were answered, and informed consent  ?                          was obtained. Prior Anticoagulants: The patient has  ?                          taken Plavix (clopidogrel), last dose was 4 days  ?                          prior to procedure. ASA Grade Assessment: III - A  ?                          patient with severe systemic disease. After  ?                          reviewing the risks and benefits, the patient was  ?                          deemed in satisfactory condition to undergo the  ?  procedure. ?                          After obtaining informed consent, the colonoscope  ?                          was passed under direct vision. Throughout the  ?                          procedure, the patient's blood pressure, pulse, and  ?                          oxygen saturations were monitored continuously. The  ?                          [id number]-HQ190L ([id number]) Olympus colonoscope was  ?                          introduced through the anus and advanced to the the  ?                          cecum, identified by appendiceal orifice and  ?                          ileocecal valve. The colonoscopy was performed  ?                          without difficulty. The patient tolerated the  ?                          procedure well. The quality of the bowel  ?                          preparation was fair. ?Scope In: 8:48:34 AM ?Scope Out: 9:25:14 AM ?Scope Withdrawal Time: 0 hours 30 minutes 26 seconds  ?Total Procedure Duration: 0 hours 36 minutes 40 seconds  ?Findings: ?     Skin tags were found on  perianal exam. ?     Five sessile polyps were found in the ascending colon(3) and cecum(2).  ?     The polyps were 6 to 8 mm in size. These polyps were removed with a hot  ?     snare and a cold biopsy forceps. Resection and retrieval were complete. ?     A 7 mm polyp was found in the transverse colon. The polyp was sessile.  ?     The polyp was removed with a piecemeal technique using a cold biopsy  ?     forceps. Resection and retrieval were complete. ?     Multiple small and large-mouthed diverticula were found in the  ?     recto-sigmoid colon, sigmoid colon, descending colon, transverse colon,  ?     hepatic flexure and ascending colon. ?     A moderate amount of semi-liquid semi-solid stool was found in the  ?     sigmoid colon, in the descending colon and in the transverse colon,  ?     interfering with visualization. Lavage of the area was performed,  ?  resulting in incomplete clearance with fair visualization. ?     Non-bleeding internal hemorrhoids were found during retroflexion. ?Impression:               - Preparation of the colon was fair. ?                          - Perianal skin tags found on perianal exam. ?                          - Five 6 to 8 mm polyps in the ascending colon and  ?                          in the cecum, removed with a hot snare. Resected  ?                          and retrieved. ?                          - One 7 mm polyp in the transverse colon, removed  ?                          piecemeal using a cold biopsy forceps. Resected and  ?                          retrieved. ?                          - Diverticulosis in the recto-sigmoid colon, in the  ?                          sigmoid colon, in the descending colon, in the  ?                          transverse colon, at the hepatic flexure and in the  ?                          ascending colon. ?                          - Stool in the sigmoid colon, in the descending  ?                          colon and in the transverse  colon. ?                          - Non-bleeding internal hemorrhoids. ?Moderate Sedation: ?     Patient did not receive moderate sedation for this procedure, but  ?     instead received monitored anesthesia care. ?Recommendation:           - High fiber diet. ?                          - Continue present medications. ?                          -  Await pathology results. ?                          - Repeat colonoscopy for surveillance based on  ?                          pathology results. ?                          - Resume Plavix (clopidogrel) at prior dose  ?                          tomorrow. ?Procedure Code(s):        --- Professional --- ?                          [phone], Colonoscopy, flexible; with removal of  ?                          tumor(s), polyp(s), or other lesion(s) by snare  ?                          technique ?                          45380, 59, Colonoscopy, flexible; with biopsy,  ?                          single or multiple ?Diagnosis Code(s):        --- Professional --- ?                          T26.7, Other hemorrhoids ?                          K63.5, Polyp of colon ?                          K64.4, Residual hemorrhoidal skin tags ?                          K92.1, Melena (includes Hematochezia) ?                          Z86.010, Personal history of colonic polyps ?                          K57.30, Diverticulosis of large intestine without  ?                          perforation or abscess without bleeding ?CPT copyright 2019 [employer]. All rights reserved. ?The codes documented in this report are preliminary and upon coder review may  ?be revised to meet current compliance requirements. ?Ronnette Juniper, MD ?03/18/2022 9:35:09 AM ?This report has been signed electronically. ?Number of Addenda: 0 ?

## 2022-03-18 NOTE — Op Note (Signed)
[facility] ?Patient Name: Andre Miranda ?Procedure Date: 03/18/2022 ?MRN: [id number] ?Attending MD: Ronnette Juniper , MD ?Date of Birth: [birth year] ?CSN: [id number] ?Age: 86 ?Admit Type: Outpatient ?Procedure:                Upper GI endoscopy ?Indications:              Weight loss, loss of appetite ?Providers:                Ronnette Juniper, MD, Ladoris Gene, RN, Elberta Fortis  ?                          Johnella Moloney, Technician ?Referring MD:             Elta Guadeloupe Perini,MD ?Medicines:                Monitored Anesthesia Care ?Complications:            No immediate complications. Estimated blood loss:  ?                          Minimal. ?Estimated Blood Loss:     Estimated blood loss was minimal. ?Procedure:                Pre-Anesthesia Assessment: ?                          - Prior to the procedure, a History and Physical  ?                          was performed, and patient medications and  ?                          allergies were reviewed. The patient's tolerance of  ?                          previous anesthesia was also reviewed. The risks  ?                          and benefits of the procedure and the sedation  ?                          options and risks were discussed with the patient.  ?                          All questions were answered, and informed consent  ?                          was obtained. Prior Anticoagulants: The patient has  ?                          taken Plavix (clopidogrel), last dose was 4 days  ?                          prior to procedure. ASA Grade Assessment: III - A  ?  patient with severe systemic disease. After  ?                          reviewing the risks and benefits, the patient was  ?                          deemed in satisfactory condition to undergo the  ?                          procedure. ?                          After obtaining informed consent, the endoscope was  ?                          passed under direct vision. Throughout the  ?                           procedure, the patient's blood pressure, pulse, and  ?                          oxygen saturations were monitored continuously. The  ?                          [id number]-[id number] ([id number]) Olympus endoscope was introduced  ?                          through the mouth, and advanced to the second part  ?                          of duodenum. The upper GI endoscopy was  ?                          accomplished without difficulty. The patient  ?                          tolerated the procedure well. ?Scope In: ?Scope Out: ?Findings: ?     The examined esophagus was normal. ?     The Z-line was regular and was found 40 cm from the incisors. ?     A few dispersed small erosions with no bleeding and no stigmata of  ?     recent bleeding were found in the gastric fundus and in the gastric  ?     antrum. ?     Localized moderately erythematous mucosa without bleeding was found in  ?     the gastric antrum. Biopsies were taken with a cold forceps for  ?     Helicobacter pylori testing. ?     Few non-bleeding superficial gastric ulcers with a clean ulcer base  ?     (Forrest Class III) were found in the gastric fundus and in the gastric  ?     antrum. ?     The examined duodenum was normal. ?Impression:               - Normal esophagus. ?                          -  Z-line regular, 40 cm from the incisors. ?                          - Erosive gastropathy with no bleeding and no  ?                          stigmata of recent bleeding. ?                          - Erythematous mucosa in the antrum. Biopsied. ?                          - Non-bleeding gastric ulcers with a clean ulcer  ?                          base (Forrest Class III). ?                          - Normal examined duodenum. ?Moderate Sedation: ?     Patient did not receive moderate sedation for this procedure, but  ?     instead received monitored anesthesia care. ?Recommendation:           - Resume regular diet. ?                          - Continue  present medications. ?                          - Await pathology results. ?                          - Use Protonix (pantoprazole) 40 mg PO BID for 2  ?                          months. ?                          - Resume Plavix (clopidogrel) at prior dose  ?                          tomorrow. ?Procedure Code(s):        --- Professional --- ?                          [phone], Esophagogastroduodenoscopy, flexible,  ?                          transoral; with biopsy, single or multiple ?Diagnosis Code(s):        --- Professional --- ?                          K31.89, Other diseases of stomach and duodenum ?                          K25.9, Gastric ulcer, unspecified as acute or  ?  chronic, without hemorrhage or perforation ?                          R63.4, Abnormal weight loss ?CPT copyright 2019 American Medical Association. All rights reserved. ?The codes documented in this report are preliminary and upon coder review may  ?be revised to meet current compliance requirements. ?Ronnette Juniper, MD ?03/18/2022 9:30:20 AM ?This report has been signed electronically. ?Number of Addenda: 0 ?

## 2022-03-18 NOTE — Transfer of Care (Signed)
Immediate Anesthesia Transfer of Care Note ? ?Patient: VIRGEL HARO ? ?Procedure(s) Performed: COLONOSCOPY WITH PROPOFOL ?ESOPHAGOGASTRODUODENOSCOPY (EGD) WITH PROPOFOL ?BIOPSY ?POLYPECTOMY ? ?Patient Location: Endoscopy Unit ? ?Anesthesia Type:MAC ? ?Level of Consciousness: awake and patient cooperative ? ?Airway & Oxygen Therapy: Patient Spontanous Breathing and Patient connected to face mask ? ?Post-op Assessment: Report given to RN and Post -op Vital signs reviewed and stable ? ?Post vital signs: Reviewed and stable ? ?Last Vitals:  ?Vitals Value Taken Time  ?BP 136/56 03/18/22 0936  ?Temp    ?Pulse 60 03/18/22 0936  ?Resp 23 03/18/22 0936  ?SpO2 97 % 03/18/22 0936  ?Vitals shown include unvalidated device data. ? ?Last Pain:  ?Vitals:  ? 03/18/22 0722  ?TempSrc: Temporal  ?PainSc: 0-No pain  ?   ? ?  ? ?Complications: No notable events documented. ?

## 2022-03-19 ENCOUNTER — Encounter (HOSPITAL_COMMUNITY): Payer: Self-pay | Admitting: Gastroenterology

## 2022-03-19 LAB — SURGICAL PATHOLOGY

## 2022-03-23 ENCOUNTER — Other Ambulatory Visit: Payer: Self-pay

## 2022-03-23 ENCOUNTER — Other Ambulatory Visit: Payer: Self-pay | Admitting: Physician Assistant

## 2022-03-23 ENCOUNTER — Emergency Department (HOSPITAL_COMMUNITY): Payer: Medicare Other

## 2022-03-23 ENCOUNTER — Inpatient Hospital Stay (HOSPITAL_COMMUNITY)
Admission: EM | Admit: 2022-03-23 | Discharge: 2022-03-27 | DRG: 246 | Disposition: A | Discharge status: Home / Self Care | Payer: Medicare Other | Attending: Internal Medicine | Admitting: Internal Medicine

## 2022-03-23 ENCOUNTER — Encounter (HOSPITAL_COMMUNITY): Payer: Self-pay

## 2022-03-23 DIAGNOSIS — J810 Acute pulmonary edema: Secondary | ICD-10-CM | POA: Diagnosis not present

## 2022-03-23 DIAGNOSIS — I951 Orthostatic hypotension: Secondary | ICD-10-CM | POA: Diagnosis present

## 2022-03-23 DIAGNOSIS — I9788 Other intraoperative complications of the circulatory system, not elsewhere classified: Secondary | ICD-10-CM | POA: Diagnosis not present

## 2022-03-23 DIAGNOSIS — I2582 Chronic total occlusion of coronary artery: Secondary | ICD-10-CM | POA: Diagnosis not present

## 2022-03-23 DIAGNOSIS — E669 Obesity, unspecified: Secondary | ICD-10-CM | POA: Diagnosis present

## 2022-03-23 DIAGNOSIS — K219 Gastro-esophageal reflux disease without esophagitis: Secondary | ICD-10-CM | POA: Diagnosis present

## 2022-03-23 DIAGNOSIS — I110 Hypertensive heart disease with heart failure: Secondary | ICD-10-CM | POA: Diagnosis present

## 2022-03-23 DIAGNOSIS — I509 Heart failure, unspecified: Secondary | ICD-10-CM

## 2022-03-23 DIAGNOSIS — Z950 Presence of cardiac pacemaker: Secondary | ICD-10-CM | POA: Diagnosis not present

## 2022-03-23 DIAGNOSIS — Z923 Personal history of irradiation: Secondary | ICD-10-CM | POA: Diagnosis not present

## 2022-03-23 DIAGNOSIS — I25118 Atherosclerotic heart disease of native coronary artery with other forms of angina pectoris: Secondary | ICD-10-CM | POA: Diagnosis not present

## 2022-03-23 DIAGNOSIS — Z7982 Long term (current) use of aspirin: Secondary | ICD-10-CM

## 2022-03-23 DIAGNOSIS — Z955 Presence of coronary angioplasty implant and graft: Secondary | ICD-10-CM

## 2022-03-23 DIAGNOSIS — I10 Essential (primary) hypertension: Secondary | ICD-10-CM | POA: Diagnosis present

## 2022-03-23 DIAGNOSIS — Z6830 Body mass index (BMI) 30.0-30.9, adult: Secondary | ICD-10-CM

## 2022-03-23 DIAGNOSIS — Z952 Presence of prosthetic heart valve: Secondary | ICD-10-CM | POA: Diagnosis not present

## 2022-03-23 DIAGNOSIS — Z86010 Personal history of colonic polyps: Secondary | ICD-10-CM

## 2022-03-23 DIAGNOSIS — I252 Old myocardial infarction: Secondary | ICD-10-CM

## 2022-03-23 DIAGNOSIS — T82855A Stenosis of coronary artery stent, initial encounter: Secondary | ICD-10-CM | POA: Diagnosis present

## 2022-03-23 DIAGNOSIS — Z951 Presence of aortocoronary bypass graft: Secondary | ICD-10-CM

## 2022-03-23 DIAGNOSIS — Z8546 Personal history of malignant neoplasm of prostate: Secondary | ICD-10-CM | POA: Diagnosis not present

## 2022-03-23 DIAGNOSIS — Z8249 Family history of ischemic heart disease and other diseases of the circulatory system: Secondary | ICD-10-CM

## 2022-03-23 DIAGNOSIS — Z7902 Long term (current) use of antithrombotics/antiplatelets: Secondary | ICD-10-CM

## 2022-03-23 DIAGNOSIS — I214 Non-ST elevation (NSTEMI) myocardial infarction: Principal | ICD-10-CM | POA: Diagnosis present

## 2022-03-23 DIAGNOSIS — I25718 Atherosclerosis of autologous vein coronary artery bypass graft(s) with other forms of angina pectoris: Secondary | ICD-10-CM | POA: Diagnosis present

## 2022-03-23 DIAGNOSIS — Z886 Allergy status to analgesic agent status: Secondary | ICD-10-CM

## 2022-03-23 DIAGNOSIS — G4733 Obstructive sleep apnea (adult) (pediatric): Secondary | ICD-10-CM | POA: Diagnosis present

## 2022-03-23 DIAGNOSIS — Z87891 Personal history of nicotine dependence: Secondary | ICD-10-CM

## 2022-03-23 DIAGNOSIS — R946 Abnormal results of thyroid function studies: Secondary | ICD-10-CM | POA: Diagnosis not present

## 2022-03-23 DIAGNOSIS — I2510 Atherosclerotic heart disease of native coronary artery without angina pectoris: Secondary | ICD-10-CM | POA: Diagnosis present

## 2022-03-23 DIAGNOSIS — I21A9 Other myocardial infarction type: Secondary | ICD-10-CM | POA: Diagnosis not present

## 2022-03-23 DIAGNOSIS — I5030 Unspecified diastolic (congestive) heart failure: Secondary | ICD-10-CM | POA: Diagnosis not present

## 2022-03-23 DIAGNOSIS — I442 Atrioventricular block, complete: Secondary | ICD-10-CM | POA: Diagnosis present

## 2022-03-23 DIAGNOSIS — E785 Hyperlipidemia, unspecified: Secondary | ICD-10-CM | POA: Diagnosis present

## 2022-03-23 DIAGNOSIS — I5032 Chronic diastolic (congestive) heart failure: Secondary | ICD-10-CM | POA: Diagnosis present

## 2022-03-23 DIAGNOSIS — E876 Hypokalemia: Secondary | ICD-10-CM | POA: Diagnosis not present

## 2022-03-23 DIAGNOSIS — Z79899 Other long term (current) drug therapy: Secondary | ICD-10-CM

## 2022-03-23 DIAGNOSIS — Y831 Surgical operation with implant of artificial internal device as the cause of abnormal reaction of the patient, or of later complication, without mention of misadventure at the time of the procedure: Secondary | ICD-10-CM | POA: Diagnosis present

## 2022-03-23 DIAGNOSIS — R079 Chest pain, unspecified: Secondary | ICD-10-CM | POA: Diagnosis not present

## 2022-03-23 DIAGNOSIS — M109 Gout, unspecified: Secondary | ICD-10-CM | POA: Diagnosis present

## 2022-03-23 DIAGNOSIS — Z9049 Acquired absence of other specified parts of digestive tract: Secondary | ICD-10-CM

## 2022-03-23 DIAGNOSIS — R0789 Other chest pain: Secondary | ICD-10-CM | POA: Diagnosis not present

## 2022-03-23 DIAGNOSIS — I25810 Atherosclerosis of coronary artery bypass graft(s) without angina pectoris: Secondary | ICD-10-CM | POA: Diagnosis not present

## 2022-03-23 LAB — TROPONIN I (HIGH SENSITIVITY)
Troponin I (High Sensitivity): 755 ng/L (ref <18)
Troponin I (High Sensitivity): 757 ng/L (ref <18)

## 2022-03-23 LAB — CBC
HCT: 43.8 % (ref 39.0–52.0)
Hemoglobin: 14.7 g/dL (ref 13.0–17.0)
MCH: 32.6 pg (ref 26.0–34.0)
MCHC: 33.6 g/dL (ref 30.0–36.0)
MCV: 97.1 fL (ref 80.0–100.0)
Platelets: 120 10*3/uL — ABNORMAL LOW (ref 150–400)
RBC: 4.51 MIL/uL (ref 4.22–5.81)
RDW: 13.1 % (ref 11.5–15.5)
WBC: 7.2 10*3/uL (ref 4.0–10.5)
nRBC: 0 % (ref 0.0–0.2)

## 2022-03-23 LAB — BASIC METABOLIC PANEL
Anion gap: 8 (ref 5–15)
BUN: 17 mg/dL (ref 8–23)
CO2: 30 mmol/L (ref 22–32)
Calcium: 9.2 mg/dL (ref 8.9–10.3)
Chloride: 103 mmol/L (ref 98–111)
Creatinine, Ser: 0.98 mg/dL (ref 0.61–1.24)
GFR, Estimated: >60 mL/min (ref >60)
Glucose, Bld: 108 mg/dL — ABNORMAL HIGH (ref 70–99)
Potassium: 4.1 mmol/L (ref 3.5–5.1)
Sodium: 141 mmol/L (ref 135–145)

## 2022-03-23 MED ORDER — METOPROLOL TARTRATE 25 MG PO TABS
25.0000 mg | ORAL_TABLET | Freq: Two times a day (BID) | ORAL | Status: DC
  Administered 2022-03-24 – 2022-03-27 (×7): 25 mg via ORAL
  Filled 2022-03-23 (×7): qty 1

## 2022-03-23 MED ORDER — ASPIRIN 81 MG PO CHEW
324.0000 mg | CHEWABLE_TABLET | Freq: Once | ORAL | Status: AC
  Administered 2022-03-23: 324 mg via ORAL
  Filled 2022-03-23: qty 4

## 2022-03-23 MED ORDER — FAMOTIDINE 20 MG PO TABS
20.0000 mg | ORAL_TABLET | Freq: Every day | ORAL | Status: DC
  Administered 2022-03-24 – 2022-03-27 (×3): 20 mg via ORAL
  Filled 2022-03-23 (×3): qty 1

## 2022-03-23 MED ORDER — ACETAMINOPHEN 500 MG PO TABS: 500.0000 mg | ORAL_TABLET | Freq: Four times a day (QID) | ORAL | Status: DC | PRN

## 2022-03-23 MED ORDER — ASPIRIN EC 81 MG PO TBEC: 81.0000 mg | DELAYED_RELEASE_TABLET | Freq: Every day | ORAL | Status: DC

## 2022-03-23 MED ORDER — ISOSORBIDE MONONITRATE ER 60 MG PO TB24
90.0000 mg | ORAL_TABLET | Freq: Every day | ORAL | Status: DC
  Administered 2022-03-24 – 2022-03-27 (×4): 90 mg via ORAL
  Filled 2022-03-23 (×2): qty 1
  Filled 2022-03-23: qty 3
  Filled 2022-03-23: qty 1

## 2022-03-23 MED ORDER — ALLOPURINOL 300 MG PO TABS
300.0000 mg | ORAL_TABLET | Freq: Every day | ORAL | Status: DC
  Administered 2022-03-24 – 2022-03-27 (×4): 300 mg via ORAL
  Filled 2022-03-23: qty 1
  Filled 2022-03-23: qty 3
  Filled 2022-03-23 (×2): qty 1

## 2022-03-23 MED ORDER — METOPROLOL TARTRATE 5 MG/5ML IV SOLN
5.0000 mg | Freq: Once | INTRAVENOUS | Status: AC
  Administered 2022-03-23: 5 mg via INTRAVENOUS
  Filled 2022-03-23: qty 5

## 2022-03-23 MED ORDER — TEMAZEPAM 15 MG PO CAPS
15.0000 mg | ORAL_CAPSULE | Freq: Every evening | ORAL | Status: DC | PRN
  Administered 2022-03-24 – 2022-03-26 (×4): 15 mg via ORAL
  Filled 2022-03-23 (×4): qty 1

## 2022-03-23 MED ORDER — NITROGLYCERIN 0.4 MG SL SUBL: 0.4000 mg | SUBLINGUAL_TABLET | SUBLINGUAL | Status: DC | PRN

## 2022-03-23 MED ORDER — ONDANSETRON HCL 4 MG/2ML IJ SOLN: 4.0000 mg | Freq: Four times a day (QID) | INTRAMUSCULAR | Status: DC | PRN

## 2022-03-23 MED ORDER — ROSUVASTATIN CALCIUM 5 MG PO TABS
10.0000 mg | ORAL_TABLET | Freq: Every evening | ORAL | Status: DC
Start: 2022-03-24 — End: 2022-03-25
  Administered 2022-03-24 (×2): 10 mg via ORAL
  Filled 2022-03-23 (×2): qty 2

## 2022-03-23 MED ORDER — RAMIPRIL 5 MG PO CAPS
5.0000 mg | ORAL_CAPSULE | Freq: Every day | ORAL | Status: DC
  Administered 2022-03-24 – 2022-03-26 (×4): 5 mg via ORAL
  Filled 2022-03-23 (×7): qty 1

## 2022-03-23 MED ORDER — ASPIRIN EC 81 MG PO TBEC
81.0000 mg | DELAYED_RELEASE_TABLET | Freq: Every day | ORAL | Status: DC
  Administered 2022-03-24: 81 mg via ORAL
  Filled 2022-03-23: qty 1

## 2022-03-23 MED ORDER — HEPARIN BOLUS VIA INFUSION
4000.0000 [IU] | Freq: Once | INTRAVENOUS | Status: AC
  Administered 2022-03-23: 4000 [IU] via INTRAVENOUS
  Filled 2022-03-23: qty 4000

## 2022-03-23 MED ORDER — ACETAMINOPHEN 325 MG PO TABS: 650.0000 mg | ORAL_TABLET | ORAL | Status: DC | PRN

## 2022-03-23 MED ORDER — HEPARIN (PORCINE) 25000 UT/250ML-% IV SOLN
1100.0000 [IU]/h | INTRAVENOUS | Status: DC
  Administered 2022-03-23: 1200 [IU]/h via INTRAVENOUS
  Administered 2022-03-24: 1100 [IU]/h via INTRAVENOUS
  Filled 2022-03-23 (×2): qty 250

## 2022-03-23 MED ORDER — FUROSEMIDE 40 MG PO TABS
40.0000 mg | ORAL_TABLET | Freq: Every day | ORAL | Status: DC
  Administered 2022-03-24 – 2022-03-27 (×3): 40 mg via ORAL
  Filled 2022-03-23 (×2): qty 1
  Filled 2022-03-23: qty 2

## 2022-03-23 MED ORDER — CLOPIDOGREL BISULFATE 75 MG PO TABS
75.0000 mg | ORAL_TABLET | Freq: Every day | ORAL | Status: DC
  Administered 2022-03-24 – 2022-03-25 (×2): 75 mg via ORAL
  Filled 2022-03-23 (×2): qty 1

## 2022-03-23 MED ORDER — PANTOPRAZOLE SODIUM 40 MG PO TBEC
40.0000 mg | DELAYED_RELEASE_TABLET | Freq: Two times a day (BID) | ORAL | Status: DC
  Administered 2022-03-24 – 2022-03-27 (×6): 40 mg via ORAL
  Filled 2022-03-23 (×6): qty 1

## 2022-03-23 NOTE — ED Triage Notes (Signed)
Pt reports he is here today due to sudden onset of cp that woke him out of his sleep at 5am. Pt reports that he took a nitro. Pt reports that he usually is able to get the cp to go way if he sits down. However he states that today he is unable to get the pain to go away. Pt reports sob.Pt denies any N&V. Pt reports that he has a pacemaker & 2 valve replacement. Pt reports cardiac cath last year.  ?

## 2022-03-23 NOTE — ED Provider Notes (Addendum)
?Andre Miranda ?Provider Note ? ? ?CSN: [id number] ?Arrival date & time: 03/23/22  2005 ? ?  ? ?History ? ?Chief Complaint  ?Patient presents with  ? Chest Pain  ? ? ?Andre Miranda is a 86 y.o. male. ? ? ?Chest Pain ? ?86 y.o. male with a hx of  CAD with prior CABG, TAVR x2 , chronic diastolic failure, hypertension who presents to the emergency department for cute onset chest pain that started at 0 500.  He report midline chest pain that was dull and pressure-like.  Chest pain lasted more than 5 minutes at which point he took nitroglycerin which helped his pain.  He did not experience similar pain in the afternoon which prompted him to come to the emergency department.  He reports associated shortness of breath.  He denies associated cough or congestion.  Denies associated fever.  Since January he does report weight loss and generalized fatigue.  He reports poor p.o. consumption for the past few months.  He skipped his water pill today because he felt dry.  His mouth was super dry and he felt like he was dehydrated.  He recently underwent a colonoscopy where they removed polyps and informed him he has ulcer in his stomach.  Denies any urinary symptoms.  Denies severe abdominal pain.  Otherwise no other complaints. ? ?Home Medications ?Prior to Admission medications   ?Medication Sig Start Date End Date Taking? Authorizing Provider  ?acetaminophen (TYLENOL) 500 MG tablet Take 500 mg by mouth every 6 (six) hours as needed for moderate pain.    [provider]  ?allopurinol (ZYLOPRIM) 300 MG tablet Take 300 mg by mouth daily.    [provider]  ?aspirin EC 81 MG tablet Take 81 mg by mouth at bedtime.  11/23/12   Larey Dresser, MD  ?azithromycin (ZITHROMAX) 500 MG tablet Take 500 mg by mouth See admin instructions. Take 500 mg 1 hour prior to dental work    [provider]  ?Cholecalciferol (VITAMIN D3) 1000 UNITS CAPS Take 1,000 Units by mouth at  bedtime.    [provider]  ?clopidogrel (PLAVIX) 75 MG tablet Take 1 tablet (75 mg total) by mouth daily with breakfast. 04/05/21   Almyra Deforest, PA  ?famotidine (PEPCID) 20 MG tablet Take 20 mg by mouth daily before breakfast.    [provider]  ?furosemide (LASIX) 40 MG tablet TAKE 1 TABLET BY MOUTH EVERY DAY 08/26/21   Belva Crome, MD  ?isosorbide mononitrate (IMDUR) 60 MG 24 hr tablet Take 1 tablet (60 mg total) by mouth daily. 03/12/22   Belva Crome, MD  ?metoprolol tartrate (LOPRESSOR) 25 MG tablet Take 1 tablet (25 mg total) by mouth 2 (two) times daily. 07/25/21   Belva Crome, MD  ?miconazole (ZEASORB-AF) 2 % powder Apply 1 application topically as needed for itching.    [provider]  ?Multiple Vitamins-Minerals (CENTRUM SILVER) tablet Take 1 tablet by mouth daily with breakfast.    [provider]  ?nitroGLYCERIN (NITROSTAT) 0.4 MG SL tablet Place 0.4 mg under the tongue every 5 (five) minutes as needed for chest pain.    [provider]  ?pantoprazole (PROTONIX) 40 MG tablet Take 1 tablet (40 mg total) by mouth 2 (two) times daily. 03/18/22 03/18/23  Ronnette Juniper, MD  ?polyethylene glycol powder (GLYCOLAX/MIRALAX) 17 GM/SCOOP powder Take 17 g by mouth daily as needed for moderate constipation. 10/15/19   [provider]  ?PRESCRIPTION MEDICATION CPAP:  At bedtime    [provider]  ?ramipril (ALTACE) 5 MG capsule Take 1 capsule (5 mg total) by mouth at bedtime. 04/01/21   Belva Crome, MD  ?rosuvastatin (CRESTOR) 10 MG tablet Take 10 mg by mouth every evening.    [provider]  ?temazepam (RESTORIL) 15 MG capsule Take 15 mg by mouth at bedtime. 03/20/21   [provider]  ?   ? ?Allergies    ?Ibuprofen   ? ?Review of Systems   ?Review of Systems  ?Cardiovascular:  Positive for chest pain.  ? ?Physical Exam ?Updated Vital Signs ?BP (!) 186/81   Pulse 68   Temp 98.5 ?F (36.9 ?C) (Oral)   Resp 18   SpO2 96%  ?Physical  Exam ?Constitutional:   ?   General: He is not in acute distress. ?Eyes:  ?   Pupils: Pupils are equal, round, and reactive to light.  ?Cardiovascular:  ?   Rate and Rhythm: Normal rate.  ?Pulmonary:  ?   Effort: No tachypnea or respiratory distress.  ?   Breath sounds: No wheezing.  ?Chest:  ?   Chest wall: No mass.  ?Abdominal:  ?   Palpations: Abdomen is soft.  ?Musculoskeletal:     ?   General: Normal range of motion.  ?   Cervical back: Normal range of motion.  ?   Right lower leg: No tenderness.  ?   Left lower leg: No tenderness.  ?Skin: ?   General: Skin is warm.  ?   Capillary Refill: Capillary refill takes less than 2 seconds.  ?Neurological:  ?   General: No focal deficit present.  ?   Mental Status: He is alert and oriented to person, place, and time.  ? ? ?ED Results / Procedures / Treatments   ?Labs ?(all labs ordered are listed, but only abnormal results are displayed) ?Labs Reviewed  ?BASIC METABOLIC PANEL - Abnormal; Notable for the following components:  ?    Result Value  ? Glucose, Bld 108 (*)   ? All other components within normal limits  ?CBC - Abnormal; Notable for the following components:  ? Platelets 120 (*)   ? All other components within normal limits  ?TROPONIN I (HIGH SENSITIVITY) - Abnormal; Notable for the following components:  ? Troponin I (High Sensitivity) 755 (*)   ? All other components within normal limits  ?TROPONIN I (HIGH SENSITIVITY) - Abnormal; Notable for the following components:  ? Troponin I (High Sensitivity) 757 (*)   ? All other components within normal limits  ?BRAIN NATRIURETIC PEPTIDE  ?HEPARIN LEVEL (UNFRACTIONATED)  ?CBC  ? ? ?EKG ?EKG Interpretation ? ?Date/Time:  Sunday Mar 23 2022 20:22:00 EDT ?Ventricular Rate:  69 ?PR Interval:  174 ?QRS Duration: 184 ?QT Interval:  454 ?QTC Calculation: 486 ?R Axis:   -13 ?Text Interpretation: Atrial-sensed ventricular-paced rhythm Abnormal ECG When compared with ECG of 07-Aug-2021 14:55, No significant change was found  Confirmed by Dorie Rank [phone]) on 03/23/2022 9:24:36 PM ? ?Radiology ?DG Chest 2 View ? ?Result Date: 03/23/2022 ?CLINICAL DATA:  Chest pain EXAM: CHEST - 2 VIEW COMPARISON:  06/24/2021 FINDINGS: Unchanged cardiac implant. Prior sternotomy, CABG, and TAVR. The heart size and mediastinal contours are within normal limits. Aortic atherosclerosis. No focal airspace consolidation, pleural effusion, or pneumothorax. The visualized skeletal structures are unremarkable. IMPRESSION: No active cardiopulmonary disease. Electronically Signed   By: Davina Poke D.O.   On: 03/23/2022 20:41   ? ?Procedures ?Procedures  ? ? ?  Medications Ordered in ED ?Medications  ?heparin ADULT infusion 100 units/mL (25000 units/292m) (1,200 Units/hr Intravenous New Bag/Given 03/23/22 2205)  ?aspirin chewable tablet 324 mg (324 mg Oral Given 03/23/22 2202)  ?heparin bolus via infusion 4,000 Units (4,000 Units Intravenous Bolus from Bag 03/23/22 2205)  ? ? ?ED Course/ Medical Decision Making/ A&P ?  ?                        ?Medical Decision Making ?Problems Addressed: ?Chest pain, unspecified type: acute illness or injury that poses a threat to life or bodily functions ?NSTEMI (non-ST elevated myocardial infarction) ([facility]: acute illness or injury that poses a threat to life or bodily functions ? ?Amount and/or Complexity of Data Reviewed ?External Data Reviewed: labs, radiology and notes. ?Labs: ordered. Decision-making details documented in ED Course. ?Radiology: ordered and independent interpretation performed. Decision-making details documented in ED Course. ?ECG/medicine tests: ordered and independent interpretation performed. Decision-making details documented in ED Course. ? ?Risk ?OTC drugs. ?Prescription drug management. ?Decision regarding hospitalization. ? ? ?Patient is a 86year old male with extensive cardiac history as listed above who presents to the emergency department for chest pain/chest tightness that started today.  His initial  vital signs are remarkable for elevated blood pressure otherwise within the reference range.  Physical exam grossly unremarkable.  No wheezing on lung auscultation.  No sign of respiratory distress.  His presentati

## 2022-03-23 NOTE — ED Notes (Signed)
Date and time results received: 03/23/22 2119 ?(use smartphrase ".now" to insert current time) ? ?Test: troponin ?Critical Value: 755 ? ?Name of Provider Notified: Tomi Bamberger ? ?Orders Received? Or Actions Taken?:  n/a ?

## 2022-03-23 NOTE — Progress Notes (Signed)
ANTICOAGULATION CONSULT NOTE ? ?Pharmacy Consult for heparin ?Indication: chest pain/ACS ? ?Allergies  ?Allergen Reactions  ? Ibuprofen Hypertension  ? ? ?Patient Measurements: ?  ?Heparin Dosing Weight: 94kg ? ?Vital Signs: ?Temp: 98.5 ?F (36.9 ?C) (05/07 2017) ?Temp Source: Oral (05/07 2017) ?BP: 184/79 (05/07 2115) ?Pulse Rate: 75 (05/07 2115) ? ?Labs: ?Recent Labs  ?  03/23/22 ?2026  ?HGB 14.7  ?HCT 43.8  ?PLT 120*  ?CREATININE 0.98  ?TROPONINIHS 755*  ? ? ?Estimated Creatinine Clearance: 65.8 mL/min (by C-G formula based on SCr of 0.98 mg/dL). ? ? ?Medical History: ?Past Medical History:  ?Diagnosis Date  ? Aortic stenosis   ? MODERATE  ? Cancer [facility])   ? prostate cancer-radiation  ? Carotid arterial disease ([place])   ? Coronary artery disease   ? Dysuria   ? GERD (gastroesophageal reflux disease)   ? Gout   ? Hematuria   ? History of urinary retention   ? Hyperlipidemia   ? Hypertension   ? Obesity   ? Sleep apnea   ? uses CPAP nightly  ? ? ?Assessment: ?37 yoM with hx CAD admitted with CP and elevated troponins. Pharmacy consulted to dose IV heparin. No AC noted PTA, H/H wnl on admit, pltc low but appears historically low. ? ?Goal of Therapy:  ?Heparin level 0.3-0.7 units/ml ?Monitor platelets by anticoagulation protocol: Yes ?  ?Plan:  ?-Heparin 4000 units x1 ?-Heparin 1200 units/hr ?-Check 8-hr heparin level ?-Monitor heparin level, CBC, S/Sx bleeding daily ? ?Arrie Senate, PharmD, BCPS, BCCP ?Clinical Pharmacist ?[phone] ?Please check AMION for all [place] numbers ?03/23/2022 ? ? ?

## 2022-03-23 NOTE — H&P (Signed)
?Cardiology Admission History and Physical:  ? ?Patient ID: Andre Miranda ?MRN: [id number]; DOB: June 19, 1936  ? ?Admission date: 03/23/2022 ? ?Primary Care Provider: Crist Infante, MD ?[facility] Cardiologist: Sinclair Grooms, MD  ?[facility] Electrophysiologist:  None  ? ?Chief Complaint: chest pain  ? ?Patient Profile:  ? ?Andre Miranda is a 86 y.o. male with CAD with prior CABG( LIMA-LAD, RIMA-RCA, & SVG-RI/OM in 2003, unstable angina treated with stent to SVG-RI/OM 03/2021, AS s/p TAVR 2016 [facility], subsequent severe aortic regurgitation --> repeat TAVR prosthesis 10/12/19 c/b CHB requiring Micra AV (09/2019) , hyperlipidemia, HTN and HFpEF who presents with chest pain.  ? ?History of Present Illness:  ? ?Mr. Higginbotham had sudden onset chest pain that woke him from sleep at 5 AM on 03/23/22.  He took 1 sublingual nitroglycerin however his pain was persistent.  He usually is able to get his chest pain to go away if he sits down however this time it did not.  He also reported associated shortness of breath but denied any nausea or emesis.  His pain lasted more than 5 minutes at which point he took sublingual nitroglycerin which did alleviate his symptoms. ? ?VS on arrival to the ED: P 65, BP 176/78, T 98.5, RR 18, O2 98%/RA.  ?Lab work notable for initial troponin is 755.  He was given aspirin 324 mg p.o. and started on heparin IV.  ? ?During my discussion with Mr. Hyser and his family he reported some exertional angina which have been fairly stable up until recently although worsening since last year.  He had similar symptoms of shortness of breath prior to his coronary intervention in May 2022.  His predominant symptom during that time was dyspnea on exertion which he said dramatically improved following intervention.  Today he has mild shortness of breath however the transient chest discomfort was fairly severe and was concerning to him prompting further evaluation.  During my evaluation he is  chest pain-free but is having intermittent shortness of breath while resting in bed. ? ?Past Medical History:  ?Diagnosis Date  ? Aortic stenosis   ? MODERATE  ? Cancer [facility])   ? prostate cancer-radiation  ? Carotid arterial disease ([place])   ? Coronary artery disease   ? Dysuria   ? GERD (gastroesophageal reflux disease)   ? Gout   ? Hematuria   ? History of urinary retention   ? Hyperlipidemia   ? Hypertension   ? Obesity   ? Sleep apnea   ? uses CPAP nightly  ? ?Past Surgical History:  ?Procedure Laterality Date  ? AORTIC VALVE REPAIR  02/06/2015  ? Dr. Aline Brochure and Dr. Ysidro Evert  at [facility]  ? BIOPSY  09/26/2019  ? Procedure: BIOPSY;  Surgeon: Otis Brace, MD;  Location: [facility] ENDOSCOPY;  Service: Gastroenterology;;  ? BIOPSY  03/18/2022  ? Procedure: BIOPSY;  Surgeon: Ronnette Juniper, MD;  Location: [facility] ENDOS[facility];  Service: Gastroenterology;;  EGD and COLON  ? CAROTID ENDARTERECTOMY  02/16/2008  ? CHOLECYSTECTOMY  04/17/2002  ? COLONOSCOPY WITH PROPOFOL Left 09/26/2019  ? Procedure: COLONOSCOPY WITH PROPOFOL;  Surgeon: Otis Brace, MD;  Location: [place];  Service: Gastroenterology;  Laterality: Left;  ? COLONOSCOPY WITH PROPOFOL N/A 03/18/2022  ? Procedure: COLONOSCOPY WITH PROPOFOL;  Surgeon: Ronnette Juniper, MD;  Location: [facility] ENDOSCOPY;  Service: Gastroenterology;  Laterality: N/A;  ? CORONARY ARTERY BYPASS GRAFT  11/17/2001  ? LIMA TO THE LAD, RIMA TO THE RCA, AND A SAPHENOUS VEIN GRAFT  TO THE INTERMEDIATE AND DISTAL LEFT CIRCUMFLEX  ? CORONARY STENT INTERVENTION N/A 04/03/2021  ? Procedure: CORONARY STENT INTERVENTION;  Surgeon: Belva Crome, MD;  Location: [place] CV LAB;  Service: Cardiovascular;  Laterality: N/A;  ? CORONARY/GRAFT ANGIOGRAPHY N/A 09/27/2019  ? Procedure: CORONARY/GRAFT ANGIOGRAPHY;  Surgeon: Belva Crome, MD;  Location: [place] CV LAB;  Service: Cardiovascular;  Laterality: N/A;  ? CORONARY/GRAFT ANGIOGRAPHY N/A 04/03/2021  ? Procedure: CORONARY/GRAFT ANGIOGRAPHY;   Surgeon: Belva Crome, MD;  Location: [place] CV LAB;  Service: Cardiovascular;  Laterality: N/A;  ? ESOPHAGOGASTRODUODENOSCOPY (EGD) WITH PROPOFOL Left 09/26/2019  ? Procedure: ESOPHAGOGASTRODUODENOSCOPY (EGD) WITH PROPOFOL;  Surgeon: Otis Brace, MD;  Location: [facility] ENDOSCOPY;  Service: Gastroenterology;  Laterality: Left;  ? ESOPHAGOGASTRODUODENOSCOPY (EGD) WITH PROPOFOL N/A 03/18/2022  ? Procedure: ESOPHAGOGASTRODUODENOSCOPY (EGD) WITH PROPOFOL;  Surgeon: Ronnette Juniper, MD;  Location: [facility] ENDOSCOPY;  Service: Gastroenterology;  Laterality: N/A;  ? INSERT / REPLACE / REMOVE PACEMAKER    ? ORIF ANKLE FRACTURE Right 02/14/2019  ? Procedure: OPEN REDUCTION INTERNAL FIXATION (ORIF) ANKLE FRACTURE;  Surgeon: Netta Cedars, MD;  Location: [place];  Service: Orthopedics;  Laterality: Right;  ? POLYPECTOMY  09/26/2019  ? Procedure: POLYPECTOMY;  Surgeon: Otis Brace, MD;  Location: [facility] ENDOSCOPY;  Service: Gastroenterology;;  ? POLYPECTOMY  03/18/2022  ? Procedure: POLYPECTOMY;  Surgeon: Ronnette Juniper, MD;  Location: [facility];  Service: Gastroenterology;;  ? US ECHOCARDIOGRAPHY  08/17/2009  ? SHOWED A MEAN GRADIENT ACROSS IS AORTIC VALVE OF 24 MM OF MERCURY. HE HAD MODERATE LVH AND NORMAL LV FUNCTION  ?  ?Medications Prior to Admission: ?Prior to Admission medications   ?Medication Sig Start Date End Date Taking? Authorizing Provider  ?acetaminophen (TYLENOL) 500 MG tablet Take 500 mg by mouth every 6 (six) hours as needed for moderate pain.    [provider]  ?allopurinol (ZYLOPRIM) 300 MG tablet Take 300 mg by mouth daily.    [provider]  ?aspirin EC 81 MG tablet Take 81 mg by mouth at bedtime.  11/23/12   Larey Dresser, MD  ?azithromycin (ZITHROMAX) 500 MG tablet Take 500 mg by mouth See admin instructions. Take 500 mg 1 hour prior to dental work    [provider]  ?Cholecalciferol (VITAMIN D3) 1000 UNITS CAPS Take 1,000 Units by mouth at bedtime.     [provider]  ?clopidogrel (PLAVIX) 75 MG tablet Take 1 tablet (75 mg total) by mouth daily with breakfast. 04/05/21   Almyra Deforest, PA  ?famotidine (PEPCID) 20 MG tablet Take 20 mg by mouth daily before breakfast.    [provider]  ?furosemide (LASIX) 40 MG tablet TAKE 1 TABLET BY MOUTH EVERY DAY 08/26/21   Belva Crome, MD  ?isosorbide mononitrate (IMDUR) 60 MG 24 hr tablet Take 1 tablet (60 mg total) by mouth daily. 03/12/22   Belva Crome, MD  ?metoprolol tartrate (LOPRESSOR) 25 MG tablet Take 1 tablet (25 mg total) by mouth 2 (two) times daily. 07/25/21   Belva Crome, MD  ?miconazole (ZEASORB-AF) 2 % powder Apply 1 application topically as needed for itching.    [provider]  ?Multiple Vitamins-Minerals (CENTRUM SILVER) tablet Take 1 tablet by mouth daily with breakfast.    [provider]  ?nitroGLYCERIN (NITROSTAT) 0.4 MG SL tablet Place 0.4 mg under the tongue every 5 (five) minutes as needed for chest pain.    [provider]  ?pantoprazole (PROTONIX) 40 MG tablet Take 1 tablet (  40 mg total) by mouth 2 (two) times daily. 03/18/22 03/18/23  Ronnette Juniper, MD  ?polyethylene glycol powder (GLYCOLAX/MIRALAX) 17 GM/SCOOP powder Take 17 g by mouth daily as needed for moderate constipation. 10/15/19   [provider]  ?PRESCRIPTION MEDICATION CPAP: At bedtime    [provider]  ?ramipril (ALTACE) 5 MG capsule Take 1 capsule (5 mg total) by mouth at bedtime. 04/01/21   Belva Crome, MD  ?rosuvastatin (CRESTOR) 10 MG tablet Take 10 mg by mouth every evening.    [provider]  ?temazepam (RESTORIL) 15 MG capsule Take 15 mg by mouth at bedtime. 03/20/21   [provider]  ?  ?Allergies:    ?Allergies  ?Allergen Reactions  ? Ibuprofen Hypertension  ? ?Social History:   ?Social History  ? ?Socioeconomic History  ? Marital status: Married  ?  Spouse name: Not on file  ? Number of children: 6  ? Years of education: 33  ? Highest education  level: Bachelor's degree (e.g., BA, AB, BS)  ?Occupational History  ? Occupation: Retired  ?Tobacco Use  ? Smoking status: Former  ?  Packs/day: 1.50  ?  Years: 7.00  ?  Pack years: 10.50  ?  Types: Cigarettes  ?

## 2022-03-24 ENCOUNTER — Inpatient Hospital Stay (HOSPITAL_COMMUNITY): Payer: Medicare Other

## 2022-03-24 DIAGNOSIS — I214 Non-ST elevation (NSTEMI) myocardial infarction: Secondary | ICD-10-CM

## 2022-03-24 LAB — LIPID PANEL
Cholesterol: 114 mg/dL (ref 0–200)
HDL: 42 mg/dL (ref >40)
LDL Cholesterol: 59 mg/dL (ref 0–99)
Total CHOL/HDL Ratio: 2.7 RATIO
Triglycerides: 67 mg/dL (ref <150)
VLDL: 13 mg/dL (ref 0–40)

## 2022-03-24 LAB — ECHOCARDIOGRAM COMPLETE
AR max vel: 2.62 cm2
AV Area VTI: 2.52 cm2
AV Area mean vel: 2.53 cm2
AV Mean grad: 4 mmHg
AV Peak grad: 8.1 mmHg
Ao pk vel: 1.42 m/s
Area-P 1/2: 2.11 cm2
MV VTI: 4.23 cm2
S' Lateral: 3.9 cm

## 2022-03-24 LAB — CBC
HCT: 40.4 % (ref 39.0–52.0)
Hemoglobin: 13.5 g/dL (ref 13.0–17.0)
MCH: 31.9 pg (ref 26.0–34.0)
MCHC: 33.4 g/dL (ref 30.0–36.0)
MCV: 95.5 fL (ref 80.0–100.0)
Platelets: 107 10*3/uL — ABNORMAL LOW (ref 150–400)
RBC: 4.23 MIL/uL (ref 4.22–5.81)
RDW: 13.1 % (ref 11.5–15.5)
WBC: 7.3 10*3/uL (ref 4.0–10.5)
nRBC: 0 % (ref 0.0–0.2)

## 2022-03-24 LAB — HEPARIN LEVEL (UNFRACTIONATED)
Heparin Unfractionated: 1.09 IU/mL — ABNORMAL HIGH (ref 0.30–0.70)
Heparin Unfractionated: 0.73 IU/mL — ABNORMAL HIGH (ref 0.30–0.70)
Heparin Unfractionated: 1.03 IU/mL — ABNORMAL HIGH (ref 0.30–0.70)

## 2022-03-24 LAB — BRAIN NATRIURETIC PEPTIDE: B Natriuretic Peptide: 152.7 pg/mL — ABNORMAL HIGH (ref 0.0–100.0)

## 2022-03-24 LAB — TSH: TSH: 5.969 u[IU]/mL — ABNORMAL HIGH (ref 0.350–4.500)

## 2022-03-24 MED ORDER — PERFLUTREN LIPID MICROSPHERE
1.0000 mL | INTRAVENOUS | Status: AC | PRN
  Administered 2022-03-24: 2 mL via INTRAVENOUS
  Filled 2022-03-24: qty 10

## 2022-03-24 MED ORDER — NITROGLYCERIN IN D5W 200-5 MCG/ML-% IV SOLN
0.0000 ug/min | INTRAVENOUS | Status: DC
  Administered 2022-03-24 (×2): 30 ug/min via INTRAVENOUS
  Filled 2022-03-24: qty 250

## 2022-03-24 MED ORDER — HEPARIN (PORCINE) 25000 UT/250ML-% IV SOLN
950.0000 [IU]/h | INTRAVENOUS | Status: DC
  Administered 2022-03-24: 800 [IU]/h via INTRAVENOUS

## 2022-03-24 MED ORDER — HYDRALAZINE HCL 25 MG PO TABS
25.0000 mg | ORAL_TABLET | Freq: Four times a day (QID) | ORAL | Status: DC | PRN
  Administered 2022-03-24 – 2022-03-27 (×2): 25 mg via ORAL
  Filled 2022-03-24 (×2): qty 1

## 2022-03-24 MED ORDER — HYDROXYZINE HCL 25 MG PO TABS
25.0000 mg | ORAL_TABLET | Freq: Three times a day (TID) | ORAL | Status: DC | PRN
  Administered 2022-03-26 – 2022-03-27 (×4): 25 mg via ORAL
  Filled 2022-03-24 (×4): qty 1

## 2022-03-24 MED ORDER — SODIUM CHLORIDE 0.9% FLUSH
3.0000 mL | Freq: Two times a day (BID) | INTRAVENOUS | Status: DC
  Administered 2022-03-24 – 2022-03-25 (×2): 3 mL via INTRAVENOUS

## 2022-03-24 MED ORDER — ASPIRIN 81 MG PO CHEW
81.0000 mg | CHEWABLE_TABLET | ORAL | Status: AC
  Administered 2022-03-25: 81 mg via ORAL
  Filled 2022-03-24: qty 1

## 2022-03-24 MED ORDER — SODIUM CHLORIDE 0.9% FLUSH: 3.0000 mL | INTRAVENOUS | Status: DC | PRN

## 2022-03-24 MED ORDER — SODIUM CHLORIDE 0.9 % IV SOLN: 250.0000 mL | INTRAVENOUS | Status: DC | PRN

## 2022-03-24 MED ORDER — SODIUM CHLORIDE 0.9 % IV SOLN: INTRAVENOUS | Status: DC

## 2022-03-24 NOTE — Progress Notes (Signed)
ANTICOAGULATION CONSULT NOTE ? ?Pharmacy Consult for heparin ?Indication: chest pain/ACS ? ?Allergies  ?Allergen Reactions  ? Ibuprofen Hypertension  ? ? ?Patient Measurements: ?Height: '5\' 10"'$  (177.8 cm) ?Weight: 93.3 kg (205 lb 11.2 oz) ?IBW/kg (Calculated) : 73 ?Heparin Dosing Weight: 94kg ? ?Vital Signs: ?Temp: 97.5 ?F (36.4 ?C) (05/08 1739) ?Temp Source: Oral (05/08 1739) ?BP: 175/79 (05/08 1739) ?Pulse Rate: 63 (05/08 1739) ? ?Labs: ?Recent Labs  ?  03/23/22 ?2026 03/23/22 ?2209 03/24/22 ?0235 03/24/22 ?8099 03/24/22 ?1717  ?HGB 14.7  --  13.5  --   --   ?HCT 43.8  --  40.4  --   --   ?PLT 120*  --  107*  --   --   ?HEPARINUNFRC  --   --  1.09* 0.73* 1.03*  ?CREATININE 0.98  --   --   --   --   ?TROPONINIHS 755* 757*  --   --   --   ? ? ? ?Estimated Creatinine Clearance: 65.5 mL/min (by C-G formula based on SCr of 0.98 mg/dL). ? ?Assessment: ?5 yoM with hx CAD admitted with CP and elevated troponins. Pharmacy consulted to dose IV heparin. Heparin level trended back up to 1.03 units/mL, supra-therapeutic.  Confirmed with RN lab was drawn appropriately.  No bleeding reported. ? ?Goal of Therapy:  ?Heparin level 0.3-0.7 units/ml ?Monitor platelets by anticoagulation protocol: Yes ?  ?Plan:  ?Hold heparin for 1 hr, then resume at 800 units/hr (RN aware) ?Check 8 hr heparin level ? ?Viriginia Amendola D. Mina Marble, PharmD, BCPS, BCCCP ?03/24/2022, 6:14 PM ? ? ?

## 2022-03-24 NOTE — Progress Notes (Addendum)
? ?Progress Note ? ?Patient Name: Andre Miranda ?Date of Encounter: 03/24/2022 ? ?[facility] Cardiologist: Sinclair Grooms, MD  ? ?Subjective  ? ?Patient currently chest pain free. He does report some dyspnea on exertion over the last couple of month. He also describes some orthopnea yesterday but currently no shortness of breath. ? ?Inpatient Medications  ?  ?Scheduled Meds: ? allopurinol  300 mg Oral Daily  ? aspirin EC  81 mg Oral QHS  ? clopidogrel  75 mg Oral Daily  ? famotidine  20 mg Oral QAC breakfast  ? furosemide  40 mg Oral Daily  ? isosorbide mononitrate  90 mg Oral Daily  ? metoprolol tartrate  25 mg Oral BID  ? pantoprazole  40 mg Oral BID  ? ramipril  5 mg Oral QHS  ? rosuvastatin  10 mg Oral QPM  ? ?Continuous Infusions: ? heparin 1,200 Units/hr (03/24/22 7858)  ? nitroGLYCERIN 10 mcg/min (03/24/22 0223)  ? ?PRN Meds: ?acetaminophen, nitroGLYCERIN, ondansetron (ZOFRAN) IV, temazepam  ? ?Vital Signs  ?  ?Vitals:  ? 03/24/22 0515 03/24/22 0530 03/24/22 0545 03/24/22 0600  ?BP: 139/74 (!) 151/68 (!) 167/81 (!) 148/83  ?Pulse: (!) 52 (!) 58 65 (!) 53  ?Resp: _0 ?Temp:      ?TempSrc:      ?SpO2: 97% 97% 98% 97%  ? ?No intake or output data in the 24 hours ending 03/24/22 0642 ? ?  03/18/2022  ?  7:22 AM 03/12/2022  ? 10:02 AM 01/07/2022  ? 10:26 AM  ?Last 3 Weights  ?Weight (lbs) 207 lb 212 lb 6.4 oz 218 lb 3.2 oz  ?Weight (kg) 93.895 kg 96.344 kg 98.975 kg  ?   ? ?Telemetry  ?  ?V-paced rhythm with PVCs and short runs of NSVT (longest run 3 beats) - Personally Reviewed ? ?ECG  ?  ?V-paced rhythm. - Personally Reviewed ? ?Physical Exam  ? ?GEN: No acute distress.   ?Neck: No JVD. ?Cardiac: RRR. Soft systolic murmur noted. No rubs or gallops. Radial and distal pedal pulses 2+ and equal bilaterally.  ?Respiratory: Clear to auscultation bilaterally. ?GI: Soft, non-distended, and non-tender. ?MS: No lower extremity edema. No deformity. ?Neuro:  No focal deficits. ?Psych: Normal affect.  Responds appropriately. ? ?Labs  ?  ?High Sensitivity Troponin:   ?Recent Labs  ?Lab 03/23/22 ?2026 03/23/22 ?2209  ?TROPONINIHS 755* 757*  ?   ?Chemistry ?Recent Labs  ?Lab 03/23/22 ?2026  ?NA 141  ?K 4.1  ?CL 103  ?CO2 30  ?GLUCOSE 108*  ?BUN 17  ?CREATININE 0.98  ?CALCIUM 9.2  ?GFRNONAA >60  ?ANIONGAP 8  ?  ?Lipids  ?Recent Labs  ?Lab 03/24/22 ?0235  ?CHOL 114  ?TRIG 67  ?HDL 42  ?[place] 59  ?CHOLHDL 2.7  ?  ?Hematology ?Recent Labs  ?Lab 03/23/22 ?2026 03/24/22 ?0235  ?WBC 7.2 7.3  ?RBC 4.51 4.23  ?HGB 14.7 13.5  ?HCT 43.8 40.4  ?MCV 97.1 95.5  ?MCH 32.6 31.9  ?MCHC 33.6 33.4  ?RDW 13.1 13.1  ?PLT 120* 107*  ? ?Thyroid  ?Recent Labs  ?Lab 03/24/22 ?0235  ?TSH 5.969*  ?  ?BNPNo results for input(s): BNP, PROBNP in the last 168 hours.  ?DDimer No results for input(s): DDIMER in the last 168 hours.  ? ?Radiology  ?  ?DG Chest 2 View ? ?Result Date: 03/23/2022 ?CLINICAL DATA:  Chest pain EXAM: CHEST - 2 VIEW COMPARISON:  06/24/2021 FINDINGS: Unchanged cardiac implant. Prior sternotomy,  CABG, and TAVR. The heart size and mediastinal contours are within normal limits. Aortic atherosclerosis. No focal airspace consolidation, pleural effusion, or pneumothorax. The visualized skeletal structures are unremarkable. IMPRESSION: No active cardiopulmonary disease. Electronically Signed   By: Davina Poke D.O.   On: 03/23/2022 20:41   ? ?Cardiac Studies  ? ?Coronary Stent Intervention 03/2021: ?A stent was successfully placed. ?  ?The native left and right coronary arteries were not selectively visualized having been previously documented to be totally occluded. ?The left internal mammary artery was not selectively visualized having been widely patent on prior angiogram 18 months ago and with no evidence of ischemia on nuclear scintigraphy within 3 to 4 weeks prior to this study. ?The right internal mammary was nonselectively engaged and was widely patent.  There was moderate diffuse disease in the grafted PDA and left  ventricular branches. ?The saphenous vein sequential graft to the ramus intermedius and circumflex contained 99% proximal stenosis followed by moderate diffuse disease with areas up to 50% throughout the entire grafted segment.  The side-to-side anastomosis with the ramus intermedius is totally occluded. ?Successful stenting of the proximal saphenous vein graft using a 4.0 x 16 Synergy stent deployed at 11 atm x 2.  TIMI grade III flow was maintained.  Pretreatment with 100 mcg of intracoronary verapamil was given.  Slow flow/no reflow was not noted at any time during the procedure.  Distal protection was not chosen due to the diffuse nature of disease in the mid and distal body of the saphenous vein graft. ?  ?Recommendations: ?Discharge tomorrow a.m.  Being kept overnight because of femoral approach in this elderly gentleman. ?Also had a right radial approach to facilitate visualization of the right internal mammary artery catheter. ?Aspirin and Plavix for 6 months. ? ? ? ?Patient Profile  ?   ?86 y.o. male with a history of CAD s/p CABG (LIMA-LAD, RIMA-RCA, and SVG-RI/OM) in 2003 with subsequent stenting to SVG-RN/OM in 03/2021, aortic stenosis s/p TAVR in 2016 at [facility] with subsequent severe aortic insufficiency s/p repeat TAVR prosthesis in 54/2706 complicated by complete heart block requiring leadless pacemaker, chronic diastolic CHF, hypertension, hyperlipidemia, obstructive sleep apnea on CPAP who was admitted on 03/23/2022 with NSTEMI after presenting with chest pain. ? ?Assessment & Plan  ?  ?NSTEMI ?CAD ?History of CAD as described above. Now presents with chest pain with associated shortness of breath. Resolved after 1 dose of Nitro but then returned later in the day. He also reports dyspnea on exertion for the past couple of months. EKG showed V paced rhythm. High-sensitivity troponin elevated but flat at 755 >> 757. ?- Currently chest pain free.  ?- Echo pending. ?- Continue IV Heparin. ?- Home Imdur  increased from 76m to 934m(recently decreased as an outpatient due to soft BP). He was initially started on Nitro drip to bridge to Imdur. He is scheduled for first dose of Imdur 90 am at 10:00am. Can wean off IV Nitro after this. ?- Continue DAPT with Aspirin and Plavix. ?- There is concern that he may of lost his SVG graft. Patient is not sure whether he wants to have repeat cardiac catheterization at this time. He is leaning towards wanting to try medical therapy first. Will discuss with MD. Would continue IV Heparin for 48 hours if we do not proceed with cath.  ? ?Chronic Diastolic CHF ?LVEF 50% on [facility]?- Appear euvolemic on exam.  ?- Will update Echo given NSTEMI as above. ?-  Continue PO Lasix 3m daily. ?- Continue home Ramipril 534mdaily and Lopressor 2541mwice daily. ?- Imdur increased as above. ? ?Aortic Valve Disease ?[facility] insufficiency s/p repeat TAVR prosthesis in 09/2019. ?- Will reassess on Echo. ?- [facility] ?Complete Heart Block s/p Leadless Pacemaker in 09/2019 ?- [facility] ?Hypertension ?History of Orthostatic Hypotension ?BP labile with systolic BP ranging from 119037 207. BP has been labile at home as well. Reviewed home BP log and systolic BP often in the [id number] 180s in the morning and then drop to the 90s to 120s in the afternoon (after taking morning medications) and then back in the 150s to 170s at night. ?- Current home medications: Ramipril 5mg52mily at bedtime, Imdur 60mg56mly, and Lopressor 25mg 24me daily. ?-  Imdur increased to 90mg d103mo recurrent chest pain (had recently been decreased due to soft BP in the office). Continue home Ramipril and Lopressor. ?- Also currently on IV Nitro but hopefully will wean this off soon. ?- Given labile BP and symptoms when BP is soft, may just need to use PRN Hydralazine if systolic BP > 150. [id number] with  MD. ? ?Hyperlipidemia ?Lipid panel this admission: Total Cholesterol 114, Triglycerides 67, HDL 42, LDL 59. Lipoprotein A 152. ?- On Crestor 10mg da34mat home. Will increase to 20mg dai44m ? ?For questions or updates, pl

## 2022-03-24 NOTE — TOC Progression Note (Signed)
Transition of Care (TOC) - Progression Note  ? ? ?Patient Details  ?Name: Andre Miranda ?MRN: [id number] ?Date of Birth: 1936/04/07 ? ?Transition of Care (TOC) CM/SW Contact  ?Zenon Mayo, RN ?Phone Number: ?03/24/2022, 5:41 PM ? ?Clinical Narrative:    ? ?Transition of Care (TOC) Screening Note ? ? ?Patient Details  ?Name: Andre Miranda ?Date of Birth: 1936/05/11 ? ? ?Transition of Care (TOC) CM/SW Contact:    ?Zenon Mayo, RN ?Phone Number: ?03/24/2022, 5:41 PM ? ? ? ?Transition of Care Department [facility]) has reviewed patient and no TOC needs have been identified at this time. We will continue to monitor patient advancement through interdisciplinary progression rounds. If new patient transition needs arise, please place a TOC consult. ?  ? ? ?  ?  ? ?Expected Discharge Plan and Services ?  ?  ?  ?  ?  ?                ?  ?  ?  ?  ?  ?  ?  ?  ?  ?  ? ? ?Social Determinants of Health (SDOH) Interventions ?  ? ?Readmission Risk Interventions ?   ? View : No data to display.  ?  ?  ?  ? ? ?

## 2022-03-24 NOTE — Progress Notes (Signed)
Pt stated feeling sob. Current bp 208/84.  ?Vikki Ports PA paged to make aware.  Night shift nurse at bedside and aware.  Scheduled metoprolol fo 2200 administered at this time.  ?

## 2022-03-24 NOTE — Progress Notes (Signed)
Patient states that he has further questions for the physician before cardiac catheterization. Consent has not been obtained consequently; will continue preparation otherwise. ?

## 2022-03-24 NOTE — Progress Notes (Signed)
Pt set upon CPAP auto titrate with nasal mask.  Tolerating well. ?

## 2022-03-24 NOTE — Progress Notes (Signed)
ANTICOAGULATION CONSULT NOTE ? ?Pharmacy Consult for heparin ?Indication: chest pain/ACS ? ?Allergies  ?Allergen Reactions  ? Ibuprofen Hypertension  ? ? ?Patient Measurements: ?  ?Heparin Dosing Weight: 94kg ? ?Vital Signs: ?Temp: 98.5 ?F (36.9 ?C) (05/07 2017) ?Temp Source: Oral (05/07 2017) ?BP: 137/76 (05/08 0630) ?Pulse Rate: 63 (05/08 0630) ? ?Labs: ?Recent Labs  ?  03/23/22 ?2026 03/23/22 ?2209 03/24/22 ?0235 03/24/22 ?9201  ?HGB 14.7  --  13.5  --   ?HCT 43.8  --  40.4  --   ?PLT 120*  --  107*  --   ?HEPARINUNFRC  --   --  1.09* 0.73*  ?CREATININE 0.98  --   --   --   ?TROPONINIHS 755* 757*  --   --   ? ? ? ?Estimated Creatinine Clearance: 65.8 mL/min (by C-G formula based on SCr of 0.98 mg/dL). ? ? ?Medical History: ?Past Medical History:  ?Diagnosis Date  ? Aortic stenosis   ? MODERATE  ? Cancer [facility])   ? prostate cancer-radiation  ? Carotid arterial disease ([place])   ? Coronary artery disease   ? Dysuria   ? GERD (gastroesophageal reflux disease)   ? Gout   ? Hematuria   ? History of urinary retention   ? Hyperlipidemia   ? Hypertension   ? Obesity   ? Sleep apnea   ? uses CPAP nightly  ? ? ?Assessment: ?Andre Miranda with hx CAD admitted with CP and elevated troponins. Pharmacy consulted to dose IV heparin. No AC noted PTA. ? ?Heparin level 0.73, slightly supratherapeutic ?Current heparin infusion rate 1200 units/hr ?Hgb 13.5, stable ?Plt 107, trending down slightly ?Per RN, no issues with heparin infusion and no s/sx of bleeding  ? ?Goal of Therapy:  ?Heparin level 0.3-0.7 units/ml ?Monitor platelets by anticoagulation protocol: Yes ?  ?Plan:  ?-Decrease heparin infusion rate to 1100 units/hr ?-Check 8-hr heparin level ?-Monitor heparin level, CBC, S/Sx bleeding daily ? ?Luisa Hart, PharmD, BCPS ?Clinical Pharmacist ?03/24/2022 7:29 AM  ? ?Please refer to [facility] for pharmacy phone number  ? ? ?

## 2022-03-24 NOTE — ED Notes (Signed)
Patient denies pain and is resting comfortably.  

## 2022-03-25 ENCOUNTER — Inpatient Hospital Stay (HOSPITAL_COMMUNITY)
Admission: EM | Disposition: A | Discharge status: Home / Self Care | Payer: Self-pay | Source: Home / Self Care | Attending: Internal Medicine

## 2022-03-25 DIAGNOSIS — I214 Non-ST elevation (NSTEMI) myocardial infarction: Secondary | ICD-10-CM | POA: Diagnosis not present

## 2022-03-25 DIAGNOSIS — I25810 Atherosclerosis of coronary artery bypass graft(s) without angina pectoris: Secondary | ICD-10-CM | POA: Diagnosis not present

## 2022-03-25 HISTORY — PX: LEFT HEART CATH AND CORS/GRAFTS ANGIOGRAPHY: CATH118250

## 2022-03-25 HISTORY — PX: CORONARY STENT INTERVENTION: CATH118234

## 2022-03-25 LAB — POCT I-STAT EG7
Acid-base deficit: 2 mmol/L (ref 0.0–2.0)
Bicarbonate: 26.9 mmol/L (ref 20.0–28.0)
Calcium, Ion: 1.26 mmol/L (ref 1.15–1.40)
HCT: 44 % (ref 39.0–52.0)
Hemoglobin: 15 g/dL (ref 13.0–17.0)
O2 Saturation: 97 %
Potassium: 3.6 mmol/L (ref 3.5–5.1)
Sodium: 128 mmol/L — ABNORMAL LOW (ref 135–145)
TCO2: 29 mmol/L (ref 22–32)
pCO2, Ven: 64.7 mmHg — ABNORMAL HIGH (ref 44–60)
pH, Ven: 7.226 — ABNORMAL LOW (ref 7.25–7.43)
pO2, Ven: 111 mmHg — ABNORMAL HIGH (ref 32–45)

## 2022-03-25 LAB — POCT I-STAT 7, (LYTES, BLD GAS, ICA,H+H)
Acid-Base Excess: 3 mmol/L — ABNORMAL HIGH (ref 0.0–2.0)
Bicarbonate: 29.4 mmol/L — ABNORMAL HIGH (ref 20.0–28.0)
Calcium, Ion: 1.24 mmol/L (ref 1.15–1.40)
HCT: 41 % (ref 39.0–52.0)
Hemoglobin: 13.9 g/dL (ref 13.0–17.0)
O2 Saturation: 98 %
Potassium: 3.7 mmol/L (ref 3.5–5.1)
Sodium: 139 mmol/L (ref 135–145)
TCO2: 31 mmol/L (ref 22–32)
pCO2 arterial: 51.9 mmHg — ABNORMAL HIGH (ref 32–48)
pH, Arterial: 7.36 (ref 7.35–7.45)
pO2, Arterial: 111 mmHg — ABNORMAL HIGH (ref 83–108)

## 2022-03-25 LAB — HEPARIN LEVEL (UNFRACTIONATED)
Heparin Unfractionated: 0.33 IU/mL (ref 0.30–0.70)
Heparin Unfractionated: 0.25 IU/mL — ABNORMAL LOW (ref 0.30–0.70)

## 2022-03-25 LAB — BASIC METABOLIC PANEL
Anion gap: 13 (ref 5–15)
BUN: 14 mg/dL (ref 8–23)
CO2: 25 mmol/L (ref 22–32)
Calcium: 9.1 mg/dL (ref 8.9–10.3)
Chloride: 101 mmol/L (ref 98–111)
Creatinine, Ser: 1.02 mg/dL (ref 0.61–1.24)
GFR, Estimated: >60 mL/min (ref >60)
Glucose, Bld: 114 mg/dL — ABNORMAL HIGH (ref 70–99)
Potassium: 4 mmol/L (ref 3.5–5.1)
Sodium: 139 mmol/L (ref 135–145)

## 2022-03-25 LAB — MRSA NEXT GEN BY PCR, NASAL: MRSA by PCR Next Gen: NOT DETECTED

## 2022-03-25 LAB — CBC
HCT: 43.3 % (ref 39.0–52.0)
Hemoglobin: 14.4 g/dL (ref 13.0–17.0)
MCH: 31.6 pg (ref 26.0–34.0)
MCHC: 33.3 g/dL (ref 30.0–36.0)
MCV: 95.2 fL (ref 80.0–100.0)
Platelets: 114 10*3/uL — ABNORMAL LOW (ref 150–400)
RBC: 4.55 MIL/uL (ref 4.22–5.81)
RDW: 13.1 % (ref 11.5–15.5)
WBC: 5.7 10*3/uL (ref 4.0–10.5)
nRBC: 0 % (ref 0.0–0.2)

## 2022-03-25 LAB — POCT ACTIVATED CLOTTING TIME
Activated Clotting Time: 233 seconds
Activated Clotting Time: 245 seconds
Activated Clotting Time: 173 seconds
Activated Clotting Time: 185 seconds

## 2022-03-25 LAB — LIPOPROTEIN A (LPA): Lipoprotein (a): 359.1 nmol/L — ABNORMAL HIGH (ref <75.0)

## 2022-03-25 LAB — GLUCOSE, CAPILLARY: Glucose-Capillary: 98 mg/dL (ref 70–99)

## 2022-03-25 SURGERY — LEFT HEART CATH AND CORS/GRAFTS ANGIOGRAPHY
Anesthesia: LOCAL

## 2022-03-25 MED ORDER — CHLORHEXIDINE GLUCONATE CLOTH 2 % EX PADS
6.0000 | MEDICATED_PAD | Freq: Every day | CUTANEOUS | Status: DC
  Administered 2022-03-25 – 2022-03-26 (×2): 6 via TOPICAL

## 2022-03-25 MED ORDER — IOHEXOL 350 MG/ML SOLN
INTRAVENOUS | Status: DC | PRN
  Administered 2022-03-25: 160 mL

## 2022-03-25 MED ORDER — FENTANYL CITRATE (PF) 100 MCG/2ML IJ SOLN
INTRAMUSCULAR | Status: AC
  Filled 2022-03-25: qty 2

## 2022-03-25 MED ORDER — ACETAMINOPHEN 325 MG PO TABS: 650.0000 mg | ORAL_TABLET | ORAL | Status: DC | PRN

## 2022-03-25 MED ORDER — SODIUM CHLORIDE 0.9% FLUSH
3.0000 mL | Freq: Two times a day (BID) | INTRAVENOUS | Status: DC
  Administered 2022-03-25 – 2022-03-27 (×4): 3 mL via INTRAVENOUS

## 2022-03-25 MED ORDER — CLOPIDOGREL BISULFATE 75 MG PO TABS
75.0000 mg | ORAL_TABLET | Freq: Every day | ORAL | Status: DC
  Administered 2022-03-26 – 2022-03-27 (×2): 75 mg via ORAL
  Filled 2022-03-25 (×2): qty 1

## 2022-03-25 MED ORDER — NITROGLYCERIN IN D5W 200-5 MCG/ML-% IV SOLN
INTRAVENOUS | Status: AC
  Filled 2022-03-25: qty 250

## 2022-03-25 MED ORDER — HEPARIN (PORCINE) IN NACL 1000-0.9 UT/500ML-% IV SOLN
INTRAVENOUS | Status: AC
  Filled 2022-03-25: qty 1000

## 2022-03-25 MED ORDER — NITROGLYCERIN IN D5W 200-5 MCG/ML-% IV SOLN: 0.0000 ug/min | INTRAVENOUS | Status: DC

## 2022-03-25 MED ORDER — MIDAZOLAM HCL 2 MG/2ML IJ SOLN
INTRAMUSCULAR | Status: DC | PRN
  Administered 2022-03-25: .5 mg via INTRAVENOUS

## 2022-03-25 MED ORDER — ENOXAPARIN SODIUM 40 MG/0.4ML IJ SOSY
40.0000 mg | PREFILLED_SYRINGE | INTRAMUSCULAR | Status: DC
  Administered 2022-03-26 – 2022-03-27 (×2): 40 mg via SUBCUTANEOUS
  Filled 2022-03-25 (×2): qty 0.4

## 2022-03-25 MED ORDER — FENTANYL CITRATE (PF) 100 MCG/2ML IJ SOLN
INTRAMUSCULAR | Status: DC | PRN
Start: 2022-03-25 — End: 2022-03-25
  Administered 2022-03-25: 25 ug via INTRAVENOUS

## 2022-03-25 MED ORDER — NITROGLYCERIN IN D5W 200-5 MCG/ML-% IV SOLN
INTRAVENOUS | Status: AC | PRN
  Administered 2022-03-25: 20 ug/min via INTRAVENOUS

## 2022-03-25 MED ORDER — NITROGLYCERIN 1 MG/10 ML FOR IR/CATH LAB
INTRA_ARTERIAL | Status: DC | PRN
  Administered 2022-03-25: 200 ug via INTRACORONARY

## 2022-03-25 MED ORDER — SODIUM CHLORIDE 0.9 % IV SOLN: 250.0000 mL | INTRAVENOUS | Status: DC | PRN

## 2022-03-25 MED ORDER — HYDRALAZINE HCL 20 MG/ML IJ SOLN: 10.0000 mg | INTRAMUSCULAR | Status: AC | PRN

## 2022-03-25 MED ORDER — MIDAZOLAM HCL 2 MG/2ML IJ SOLN
INTRAMUSCULAR | Status: AC
  Filled 2022-03-25: qty 2

## 2022-03-25 MED ORDER — CLOPIDOGREL BISULFATE 300 MG PO TABS
300.0000 mg | ORAL_TABLET | Freq: Once | ORAL | Status: AC
  Administered 2022-03-25: 300 mg via ORAL
  Filled 2022-03-25: qty 1

## 2022-03-25 MED ORDER — SODIUM CHLORIDE 0.9 % IV SOLN
INTRAVENOUS | Status: DC | PRN
  Administered 2022-03-25: 1 mg/kg/h via INTRAVENOUS

## 2022-03-25 MED ORDER — NITROGLYCERIN 1 MG/10 ML FOR IR/CATH LAB
INTRA_ARTERIAL | Status: AC
  Filled 2022-03-25: qty 10

## 2022-03-25 MED ORDER — FUROSEMIDE 10 MG/ML IJ SOLN
40.0000 mg | Freq: Once | INTRAMUSCULAR | Status: AC
  Administered 2022-03-25: 40 mg via INTRAVENOUS
  Filled 2022-03-25: qty 4

## 2022-03-25 MED ORDER — ASPIRIN 81 MG PO CHEW
81.0000 mg | CHEWABLE_TABLET | Freq: Every day | ORAL | Status: DC
  Administered 2022-03-25 – 2022-03-27 (×3): 81 mg via ORAL
  Filled 2022-03-25 (×3): qty 1

## 2022-03-25 MED ORDER — ONDANSETRON HCL 4 MG/2ML IJ SOLN: 4.0000 mg | Freq: Four times a day (QID) | INTRAMUSCULAR | Status: DC | PRN

## 2022-03-25 MED ORDER — ROSUVASTATIN CALCIUM 20 MG PO TABS
20.0000 mg | ORAL_TABLET | Freq: Every evening | ORAL | Status: DC
  Administered 2022-03-25 – 2022-03-26 (×2): 20 mg via ORAL
  Filled 2022-03-25 (×2): qty 1

## 2022-03-25 MED ORDER — FUROSEMIDE 10 MG/ML IJ SOLN
INTRAMUSCULAR | Status: AC
  Filled 2022-03-25: qty 4

## 2022-03-25 MED ORDER — LIDOCAINE HCL (PF) 1 % IJ SOLN
INTRAMUSCULAR | Status: AC
  Filled 2022-03-25: qty 30

## 2022-03-25 MED ORDER — LABETALOL HCL 5 MG/ML IV SOLN: 10.0000 mg | INTRAVENOUS | Status: AC | PRN

## 2022-03-25 MED ORDER — LIDOCAINE HCL (PF) 1 % IJ SOLN
INTRAMUSCULAR | Status: DC | PRN
  Administered 2022-03-25: 10 mg
  Administered 2022-03-25: 10 mL

## 2022-03-25 MED ORDER — ENOXAPARIN SODIUM 40 MG/0.4ML IJ SOSY: 40.0000 mg | PREFILLED_SYRINGE | INTRAMUSCULAR | Status: DC

## 2022-03-25 MED ORDER — FUROSEMIDE 10 MG/ML IJ SOLN
INTRAMUSCULAR | Status: DC | PRN
  Administered 2022-03-25: 40 mg via INTRAVENOUS

## 2022-03-25 MED ORDER — SODIUM CHLORIDE 0.9 % IV SOLN: INTRAVENOUS | Status: AC

## 2022-03-25 MED ORDER — SODIUM CHLORIDE 0.9% FLUSH: 3.0000 mL | INTRAVENOUS | Status: DC | PRN

## 2022-03-25 MED ORDER — BIVALIRUDIN BOLUS VIA INFUSION - CUPID
INTRAVENOUS | Status: DC | PRN
  Administered 2022-03-25: 69.225 mg via INTRAVENOUS

## 2022-03-25 SURGICAL SUPPLY — 21 items
BALL SAPPHIRE NC24 4.5X12 (BALLOONS) ×2
BALLN SAPPHIRE 3.5X15 (BALLOONS) ×2
BALLOON SAPPHIRE 3.5X15 (BALLOONS) IMPLANT
BALLOON SAPPHIRE NC24 4.5X12 (BALLOONS) IMPLANT
CATH INFINITI 5 FR IM (CATHETERS) ×1 IMPLANT
CATH INFINITI 5FR AL1 (CATHETERS) ×1 IMPLANT
CATH LAUNCHER 6FR AL1 (CATHETERS) IMPLANT
CATH VISTA GUIDE 6FR AL1 (CATHETERS) ×1 IMPLANT
CATHETER LAUNCHER 6FR AL1 (CATHETERS) ×2
GUIDEWIRE ANGLED .035X150CM (WIRE) ×1 IMPLANT
KIT ENCORE 26 ADVANTAGE (KITS) ×1 IMPLANT
KIT HEART LEFT (KITS) ×2 IMPLANT
PACK CARDIAC CATHETERIZATION (CUSTOM PROCEDURE TRAY) ×2 IMPLANT
SHEATH PINNACLE 5F 10CM (SHEATH) ×1 IMPLANT
SHEATH PINNACLE 6F 10CM (SHEATH) ×1 IMPLANT
SHEATH PROBE COVER 6X72 (BAG) ×1 IMPLANT
STENT ONYX FRONTIER 4.0X22 (Permanent Stent) ×1 IMPLANT
TRANSDUCER W/STOPCOCK (MISCELLANEOUS) ×2 IMPLANT
TUBING CIL FLEX 10 FLL-RA (TUBING) ×2 IMPLANT
WIRE ASAHI PROWATER 180CM (WIRE) ×1 IMPLANT
WIRE EMERALD 3MM-J .035X150CM (WIRE) ×1 IMPLANT

## 2022-03-25 NOTE — Progress Notes (Signed)
Patient off floor to cath lab.  

## 2022-03-25 NOTE — Progress Notes (Signed)
Pt placed on BiPAP per MD. Pt tolerating well,MD at bedside, RT will continue to mitior,  ?

## 2022-03-25 NOTE — Interval H&P Note (Signed)
Cath Lab Visit (complete for each Cath Lab visit) ? ?Clinical Evaluation Leading to the Procedure:  ? ?ACS: Yes.   ? ?Non-ACS:   ? ?Anginal Classification: CCS III ? ?Anti-ischemic medical therapy: Minimal Therapy (1 class of medications) ? ?Non-Invasive Test Results: No non-invasive testing performed ? ?Prior CABG: Previous CABG ? ? ? ? ? ?History and Physical Interval Note: ? ?03/25/2022 ?2:30 PM ? ?Andre Miranda  has presented today for surgery, with the diagnosis of nstemi.  The various methods of treatment have been discussed with the patient and family. After consideration of risks, benefits and other options for treatment, the patient has consented to  Procedure(s): ?LEFT HEART CATH AND CORS/GRAFTS ANGIOGRAPHY (N/A) as a surgical intervention.  The patient's history has been reviewed, patient examined, no change in status, stable for surgery.  I have reviewed the patient's chart and labs.  Questions were answered to the patient's satisfaction.   ? ? ?[name] ? ? ?

## 2022-03-25 NOTE — Progress Notes (Signed)
? ?Progress Note ? ?Patient Name: Andre Miranda ?Date of Encounter: 03/25/2022 ? ?[facility] Cardiologist: Sinclair Grooms, MD  ? ?Subjective  ? ?Mild chest pain earlier today. No pain now.  ? ?Inpatient Medications  ?  ?Scheduled Meds: ? allopurinol  300 mg Oral Daily  ? aspirin EC  81 mg Oral QHS  ? clopidogrel  75 mg Oral Daily  ? famotidine  20 mg Oral QAC breakfast  ? furosemide  40 mg Oral Daily  ? isosorbide mononitrate  90 mg Oral Daily  ? metoprolol tartrate  25 mg Oral BID  ? pantoprazole  40 mg Oral BID  ? ramipril  5 mg Oral QHS  ? rosuvastatin  10 mg Oral QPM  ? sodium chloride flush  3 mL Intravenous Q12H  ? ?Continuous Infusions: ? sodium chloride    ? sodium chloride 50 mL/hr at 03/25/22 0830  ? heparin 800 Units/hr (03/24/22 1916)  ? nitroGLYCERIN Stopped (03/24/22 1300)  ? ?PRN Meds: ?sodium chloride, acetaminophen, hydrALAZINE, hydrOXYzine, nitroGLYCERIN, ondansetron (ZOFRAN) IV, sodium chloride flush, temazepam  ? ?Vital Signs  ?  ?Vitals:  ? 03/24/22 2319 03/25/22 0107 03/25/22 0400 03/25/22 0700  ?BP:   135/70 (!) 174/77  ?Pulse: 82   68  ?Resp: '20  10 14  '$ ?Temp:   97.8 ?F (36.6 ?C) 97.9 ?F (36.6 ?C)  ?TempSrc:   Oral Oral  ?SpO2: 96%  96% 96%  ?Weight:  92.3 kg    ?Height:      ? ? ?Intake/Output Summary (Last 24 hours) at 03/25/2022 1006 ?Last data filed at 03/25/2022 0934 ?Gross per 24 hour  ?Intake 389.4 ml  ?Output 1100 ml  ?Net -710.6 ml  ? ? ?  03/25/2022  ?  1:07 AM 03/24/2022  ?  5:39 PM 03/18/2022  ?  7:22 AM  ?Last 3 Weights  ?Weight (lbs) 203 lb 8 oz 205 lb 11.2 oz 207 lb  ?Weight (kg) 92.307 kg 93.305 kg 93.895 kg  ?   ? ?Telemetry  ?  ?V paced - Personally Reviewed ? ?ECG  ?  ?No am ekg - Personally Reviewed ? ?Physical Exam  ? ?GEN: No acute distress.   ?Neck: No JVD ?Cardiac: RRR, no murmurs, rubs, or gallops.  ?Respiratory: Clear to auscultation bilaterally. ?GI: Soft, nontender, non-distended  ?MS: No edema; No deformity. ?Neuro:  Nonfocal  ?Psych: Normal affect  ? ?Labs  ?   ?High Sensitivity Troponin:   ?Recent Labs  ?Lab 03/23/22 ?2026 03/23/22 ?2209  ?TROPONINIHS 755* 757*  ?   ?Chemistry ?Recent Labs  ?Lab 03/23/22 ?2026  ?NA 141  ?K 4.1  ?CL 103  ?CO2 30  ?GLUCOSE 108*  ?BUN 17  ?CREATININE 0.98  ?CALCIUM 9.2  ?GFRNONAA >60  ?ANIONGAP 8  ?  ?Lipids  ?Recent Labs  ?Lab 03/24/22 ?0235  ?CHOL 114  ?TRIG 67  ?HDL 42  ?[place] 59  ?CHOLHDL 2.7  ?  ?Hematology ?Recent Labs  ?Lab 03/23/22 ?2026 03/24/22 ?0235 03/25/22 ?0411  ?WBC 7.2 7.3 5.7  ?RBC 4.51 4.23 4.55  ?HGB 14.7 13.5 14.4  ?HCT 43.8 40.4 43.3  ?MCV 97.1 95.5 95.2  ?MCH 32.6 31.9 31.6  ?MCHC 33.6 33.4 33.3  ?RDW 13.1 13.1 13.1  ?PLT 120* 107* 114*  ? ?Thyroid  ?Recent Labs  ?Lab 03/24/22 ?0235  ?TSH 5.969*  ?  ?BNP ?Recent Labs  ?Lab 03/24/22 ?0235  ?BNP 152.7*  ?  ?DDimer No results for input(s): DDIMER in the last 168 hours.  ? ?  Radiology  ?  ?DG Chest 2 View ? ?Result Date: 03/23/2022 ?CLINICAL DATA:  Chest pain EXAM: CHEST - 2 VIEW COMPARISON:  06/24/2021 FINDINGS: Unchanged cardiac implant. Prior sternotomy, CABG, and TAVR. The heart size and mediastinal contours are within normal limits. Aortic atherosclerosis. No focal airspace consolidation, pleural effusion, or pneumothorax. The visualized skeletal structures are unremarkable. IMPRESSION: No active cardiopulmonary disease. Electronically Signed   By: Davina Poke D.O.   On: 03/23/2022 20:41  ? ?ECHOCARDIOGRAM COMPLETE ? ?Result Date: 03/24/2022 ?   ECHOCARDIOGRAM REPORT   Patient Name:   Andre Miranda Date of Exam: 03/24/2022 Medical Rec #:  [id number]          Height:       70.0 in Accession #:    [id number]         Weight:       207.0 lb Date of Birth:  December 11, 1935          BSA:          2.118 m? Patient Age:    86 years           BP:           164/80 mmHg Patient Gender: M                  HR:           54 bpm. Exam Location:  Inpatient Procedure: 2D Echo, Cardiac Doppler, Color Doppler and Intracardiac            Opacification Agent Indications:    NSTEMI  History:         Patient has prior history of Echocardiogram examinations, most                 recent 09/11/2019. CAD, Signs/Symptoms:Murmur; Risk                 Factors:Dyslipidemia and Hypertension. 11/02/19 TAVR.  Sonographer:    [place] Referring Phys: [id number] [place]  1. Left ventricular ejection fraction, by estimation, is 50 to 55%. The left ventricle has low normal function. The left ventricle demonstrates regional wall motion abnormalities (see scoring diagram/findings for description). There is mild left ventricular hypertrophy. Left ventricular diastolic parameters are consistent with Grade I diastolic dysfunction (impaired relaxation).  2. Right ventricular systolic function is normal. The right ventricular size is normal.  3. The mitral valve is normal in structure. No evidence of mitral valve regurgitation. No evidence of mitral stenosis. Moderate mitral annular calcification.  4. The aortic valve has been replaced with TAVR 26m [employer]     . Aortic valve regurgitation is not visualized. No aortic stenosis is present. Procedure Date: 11/02/19. Aortic valve mean gradient measures 4.0 mmHg.  5. The inferior vena cava is normal in size with greater than 50% respiratory variability, suggesting right atrial pressure of 3 mmHg. FINDINGS  Left Ventricle: Left ventricular ejection fraction, by estimation, is 50 to 55%. The left ventricle has low normal function. The left ventricle demonstrates regional wall motion abnormalities. The left ventricular internal cavity size was normal in size. There is mild left ventricular hypertrophy. Left ventricular diastolic parameters are consistent with Grade I diastolic dysfunction (impaired relaxation).  LV Wall Scoring: The mid anteroseptal segment and mid inferoseptal segment are hypokinetic. Right Ventricle: The right ventricular size is normal. No increase in right ventricular wall thickness. Right ventricular systolic function is  normal. Left Atrium: Left atrial size was normal in  size. Right Atrium: Right atrial size was normal in size. Pericardium: There is no evidence of pericardial effusion. Mitral Valve: The mitral valve is normal in structure. Moderate mitral annular calcification. No evidence of mitral valve regurgitation. No evidence of mitral valve stenosis. MV peak gradient, 3.3 mmHg. The mean mitral valve gradient is 1.0 mmHg. Tricuspid Valve: The tricuspid valve is normal in structure. Tricuspid valve regurgitation is trivial. No evidence of tricuspid stenosis. Aortic Valve: The aortic valve has been repaired/replaced. Aortic valve regurgitation is not visualized. No aortic stenosis is present. Aortic valve mean gradient measures 4.0 mmHg. Aortic valve peak gradient measures 8.1 mmHg. Aortic valve area, by VTI measures 2.52 cm?Marland Kitchen There is a 27 mm [employer] valve present in the aortic position. Pulmonic Valve: The pulmonic valve was normal in structure. Pulmonic valve regurgitation is not visualized. No evidence of pulmonic stenosis. Aorta: The aortic root is normal in size and structure. Venous: The inferior vena cava is normal in size with greater than 50% respiratory variability, suggesting right atrial pressure of 3 mmHg. IAS/Shunts: No atrial level shunt detected by color flow Doppler.  LEFT VENTRICLE PLAX 2D LVIDd:         5.20 cm   Diastology LVIDs:         3.90 cm   LV e' medial:    2.69 cm/s LV PW:         1.40 cm   LV E/e' medial:  15.2 LV IVS:        1.20 cm   LV e' lateral:   3.50 cm/s LVOT diam:     1.90 cm   LV E/e' lateral: 11.7 LV SV:         86 LV SV Index:   41 LVOT Area:     2.84 cm?  RIGHT VENTRICLE RV S prime:     8.00 cm/s TAPSE (M-mode): 2.0 cm LEFT ATRIUM             Index LA Vol (A2C):   45.8 ml 21.62 ml/m? LA Vol (A4C):   81.3 ml 38.38 ml/m? LA Biplane Vol: 65.4 ml 30.88 ml/m?  AORTIC VALVE                    PULMONIC VALVE AV Area (Vmax):    2.62 cm?     PV Vmax:       1.15 m/s AV Area (Vmean):    2.53 cm?     PV Vmean:      73.800 cm/s AV Area (VTI):     2.52 cm?     PV VTI:        0.261 m AV Vmax:           142.00 cm/s  PV Peak grad:  5.3 mmHg AV Vmean:          96.400 cm/s  PV Mean grad:

## 2022-03-25 NOTE — Progress Notes (Signed)
Patient stated that he wished to change his DNR status to full code. Conversation was shared with his family at bedside and he was adamant about receiving compressions/intubation/ACLS measures. This request was passed along to cardiology and orders will be placed for Full Code status.  ?

## 2022-03-25 NOTE — H&P (View-Only) (Signed)
? ?Progress Note ? ?Patient Name: Andre Miranda ?Date of Encounter: 03/25/2022 ? ?[facility] Cardiologist: Sinclair Grooms, MD  ? ?Subjective  ? ?Mild chest pain earlier today. No pain now.  ? ?Inpatient Medications  ?  ?Scheduled Meds: ? allopurinol  300 mg Oral Daily  ? aspirin EC  81 mg Oral QHS  ? clopidogrel  75 mg Oral Daily  ? famotidine  20 mg Oral QAC breakfast  ? furosemide  40 mg Oral Daily  ? isosorbide mononitrate  90 mg Oral Daily  ? metoprolol tartrate  25 mg Oral BID  ? pantoprazole  40 mg Oral BID  ? ramipril  5 mg Oral QHS  ? rosuvastatin  10 mg Oral QPM  ? sodium chloride flush  3 mL Intravenous Q12H  ? ?Continuous Infusions: ? sodium chloride    ? sodium chloride 50 mL/hr at 03/25/22 0830  ? heparin 800 Units/hr (03/24/22 1916)  ? nitroGLYCERIN Stopped (03/24/22 1300)  ? ?PRN Meds: ?sodium chloride, acetaminophen, hydrALAZINE, hydrOXYzine, nitroGLYCERIN, ondansetron (ZOFRAN) IV, sodium chloride flush, temazepam  ? ?Vital Signs  ?  ?Vitals:  ? 03/24/22 2319 03/25/22 0107 03/25/22 0400 03/25/22 0700  ?BP:   135/70 (!) 174/77  ?Pulse: 82   68  ?Resp: '20  10 14  '$ ?Temp:   97.8 ?F (36.6 ?C) 97.9 ?F (36.6 ?C)  ?TempSrc:   Oral Oral  ?SpO2: 96%  96% 96%  ?Weight:  92.3 kg    ?Height:      ? ? ?Intake/Output Summary (Last 24 hours) at 03/25/2022 1006 ?Last data filed at 03/25/2022 0934 ?Gross per 24 hour  ?Intake 389.4 ml  ?Output 1100 ml  ?Net -710.6 ml  ? ? ?  03/25/2022  ?  1:07 AM 03/24/2022  ?  5:39 PM 03/18/2022  ?  7:22 AM  ?Last 3 Weights  ?Weight (lbs) 203 lb 8 oz 205 lb 11.2 oz 207 lb  ?Weight (kg) 92.307 kg 93.305 kg 93.895 kg  ?   ? ?Telemetry  ?  ?V paced - Personally Reviewed ? ?ECG  ?  ?No am ekg - Personally Reviewed ? ?Physical Exam  ? ?GEN: No acute distress.   ?Neck: No JVD ?Cardiac: RRR, no murmurs, rubs, or gallops.  ?Respiratory: Clear to auscultation bilaterally. ?GI: Soft, nontender, non-distended  ?MS: No edema; No deformity. ?Neuro:  Nonfocal  ?Psych: Normal affect  ? ?Labs  ?   ?High Sensitivity Troponin:   ?Recent Labs  ?Lab 03/23/22 ?2026 03/23/22 ?2209  ?TROPONINIHS 755* 757*  ?   ?Chemistry ?Recent Labs  ?Lab 03/23/22 ?2026  ?NA 141  ?K 4.1  ?CL 103  ?CO2 30  ?GLUCOSE 108*  ?BUN 17  ?CREATININE 0.98  ?CALCIUM 9.2  ?GFRNONAA >60  ?ANIONGAP 8  ?  ?Lipids  ?Recent Labs  ?Lab 03/24/22 ?0235  ?CHOL 114  ?TRIG 67  ?HDL 42  ?[place] 59  ?CHOLHDL 2.7  ?  ?Hematology ?Recent Labs  ?Lab 03/23/22 ?2026 03/24/22 ?0235 03/25/22 ?0411  ?WBC 7.2 7.3 5.7  ?RBC 4.51 4.23 4.55  ?HGB 14.7 13.5 14.4  ?HCT 43.8 40.4 43.3  ?MCV 97.1 95.5 95.2  ?MCH 32.6 31.9 31.6  ?MCHC 33.6 33.4 33.3  ?RDW 13.1 13.1 13.1  ?PLT 120* 107* 114*  ? ?Thyroid  ?Recent Labs  ?Lab 03/24/22 ?0235  ?TSH 5.969*  ?  ?BNP ?Recent Labs  ?Lab 03/24/22 ?0235  ?BNP 152.7*  ?  ?DDimer No results for input(s): DDIMER in the last 168 hours.  ? ?  Radiology  ?  ?DG Chest 2 View ? ?Result Date: 03/23/2022 ?CLINICAL DATA:  Chest pain EXAM: CHEST - 2 VIEW COMPARISON:  06/24/2021 FINDINGS: Unchanged cardiac implant. Prior sternotomy, CABG, and TAVR. The heart size and mediastinal contours are within normal limits. Aortic atherosclerosis. No focal airspace consolidation, pleural effusion, or pneumothorax. The visualized skeletal structures are unremarkable. IMPRESSION: No active cardiopulmonary disease. Electronically Signed   By: Davina Poke D.O.   On: 03/23/2022 20:41  ? ?ECHOCARDIOGRAM COMPLETE ? ?Result Date: 03/24/2022 ?   ECHOCARDIOGRAM REPORT   Patient Name:   Andre Miranda Date of Exam: 03/24/2022 Medical Rec #:  [id number]          Height:       70.0 in Accession #:    [id number]         Weight:       207.0 lb Date of Birth:  03/20/1936          BSA:          2.118 m? Patient Age:    86 years           BP:           164/80 mmHg Patient Gender: M                  HR:           54 bpm. Exam Location:  Inpatient Procedure: 2D Echo, Cardiac Doppler, Color Doppler and Intracardiac            Opacification Agent Indications:    NSTEMI  History:         Patient has prior history of Echocardiogram examinations, most                 recent 09/11/2019. CAD, Signs/Symptoms:Murmur; Risk                 Factors:Dyslipidemia and Hypertension. 11/02/19 TAVR.  Sonographer:    [place] Referring Phys: [id number] [place]  1. Left ventricular ejection fraction, by estimation, is 50 to 55%. The left ventricle has low normal function. The left ventricle demonstrates regional wall motion abnormalities (see scoring diagram/findings for description). There is mild left ventricular hypertrophy. Left ventricular diastolic parameters are consistent with Grade I diastolic dysfunction (impaired relaxation).  2. Right ventricular systolic function is normal. The right ventricular size is normal.  3. The mitral valve is normal in structure. No evidence of mitral valve regurgitation. No evidence of mitral stenosis. Moderate mitral annular calcification.  4. The aortic valve has been replaced with TAVR 7m Direct Flow Medical     . Aortic valve regurgitation is not visualized. No aortic stenosis is present. Procedure Date: 11/02/19. Aortic valve mean gradient measures 4.0 mmHg.  5. The inferior vena cava is normal in size with greater than 50% respiratory variability, suggesting right atrial pressure of 3 mmHg. FINDINGS  Left Ventricle: Left ventricular ejection fraction, by estimation, is 50 to 55%. The left ventricle has low normal function. The left ventricle demonstrates regional wall motion abnormalities. The left ventricular internal cavity size was normal in size. There is mild left ventricular hypertrophy. Left ventricular diastolic parameters are consistent with Grade I diastolic dysfunction (impaired relaxation).  LV Wall Scoring: The mid anteroseptal segment and mid inferoseptal segment are hypokinetic. Right Ventricle: The right ventricular size is normal. No increase in right ventricular wall thickness. Right ventricular systolic function is  normal. Left Atrium: Left atrial size was normal in  size. Right Atrium: Right atrial size was normal in size. Pericardium: There is no evidence of pericardial effusion. Mitral Valve: The mitral valve is normal in structure. Moderate mitral annular calcification. No evidence of mitral valve regurgitation. No evidence of mitral valve stenosis. MV peak gradient, 3.3 mmHg. The mean mitral valve gradient is 1.0 mmHg. Tricuspid Valve: The tricuspid valve is normal in structure. Tricuspid valve regurgitation is trivial. No evidence of tricuspid stenosis. Aortic Valve: The aortic valve has been repaired/replaced. Aortic valve regurgitation is not visualized. No aortic stenosis is present. Aortic valve mean gradient measures 4.0 mmHg. Aortic valve peak gradient measures 8.1 mmHg. Aortic valve area, by VTI measures 2.52 cm?Marland Kitchen There is a 27 mm Direct Flow Medical valve present in the aortic position. Pulmonic Valve: The pulmonic valve was normal in structure. Pulmonic valve regurgitation is not visualized. No evidence of pulmonic stenosis. Aorta: The aortic root is normal in size and structure. Venous: The inferior vena cava is normal in size with greater than 50% respiratory variability, suggesting right atrial pressure of 3 mmHg. IAS/Shunts: No atrial level shunt detected by color flow Doppler.  LEFT VENTRICLE PLAX 2D LVIDd:         5.20 cm   Diastology LVIDs:         3.90 cm   LV e' medial:    2.69 cm/s LV PW:         1.40 cm   LV E/e' medial:  15.2 LV IVS:        1.20 cm   LV e' lateral:   3.50 cm/s LVOT diam:     1.90 cm   LV E/e' lateral: 11.7 LV SV:         86 LV SV Index:   41 LVOT Area:     2.84 cm?  RIGHT VENTRICLE RV S prime:     8.00 cm/s TAPSE (M-mode): 2.0 cm LEFT ATRIUM             Index LA Vol (A2C):   45.8 ml 21.62 ml/m? LA Vol (A4C):   81.3 ml 38.38 ml/m? LA Biplane Vol: 65.4 ml 30.88 ml/m?  AORTIC VALVE                    PULMONIC VALVE AV Area (Vmax):    2.62 cm?     PV Vmax:       1.15 m/s AV Area (Vmean):    2.53 cm?     PV Vmean:      73.800 cm/s AV Area (VTI):     2.52 cm?     PV VTI:        0.261 m AV Vmax:           142.00 cm/s  PV Peak grad:  5.3 mmHg AV Vmean:          96.400 cm/s  PV Mean grad:

## 2022-03-25 NOTE — Plan of Care (Signed)
?  Problem: Education: ?Goal: Knowledge of General Education information will improve ?Description: Including pain rating scale, medication(s)/side effects and non-pharmacologic comfort measures ?Outcome: Progressing ?  ?Problem: Nutrition: ?Goal: Adequate nutrition will be maintained ?Outcome: Progressing ?  ?Problem: Activity: ?Goal: Ability to return to baseline activity level will improve ?Outcome: Progressing ?  ?Problem: Cardiovascular: ?Goal: Vascular access site(s) Level 0-1 will be maintained ?Outcome: Progressing ?  ?

## 2022-03-25 NOTE — Progress Notes (Signed)
Pt transported on BiPAP from Cath lab to [place] without any complications. RN at bedside, RT will continue to monitor.  ?

## 2022-03-25 NOTE — CV Procedure (Signed)
95 to 99% in-stent restenosis and graft to the circumflex.  Restented using a 22 x 4.0 Onyx postdilated to 4.5 mm in diameter. ?Acute hypercarbic respiratory failure due to congestive heart failure occurred in the Cath Lab associated with significant blood pressure elevation requiring institution of BiPAP, intracoronary and IV nitroglycerin, and IV Lasix.  IV hydration was discontinued. ?Patent LIMA to LAD ?Patent RIMA to RCA ?He will be transferred to ICU with respiratory management per critical care medicine; IV diuresis; IV nitroglycerin.  Right kidney function. ?

## 2022-03-25 NOTE — Progress Notes (Signed)
ANTICOAGULATION CONSULT NOTE ? ?Pharmacy Consult for heparin ?Indication: chest pain/ACS ? ?Allergies  ?Allergen Reactions  ? Ibuprofen Hypertension  ? ? ?Patient Measurements: ?Height: '5\' 10"'$  (177.8 cm) ?Weight: 92.3 kg (203 lb 8 oz) ?IBW/kg (Calculated) : 73 ?Heparin Dosing Weight: 94kg ? ?Vital Signs: ?Temp: 97.8 ?F (36.6 ?C) (05/09 0400) ?Temp Source: Oral (05/09 0400) ?BP: 135/70 (05/09 0400) ?Pulse Rate: 82 (05/08 2319) ? ?Labs: ?Recent Labs  ?  03/23/22 ?2026 03/23/22 ?2026 03/23/22 ?2209 03/24/22 ?0235 03/24/22 ?7793 03/24/22 ?1717 03/25/22 ?0411  ?HGB 14.7  --   --  13.5  --   --  14.4  ?HCT 43.8  --   --  40.4  --   --  43.3  ?PLT 120*  --   --  107*  --   --  114*  ?HEPARINUNFRC  --    < >  --  1.09* 0.73* 1.03* 0.33  ?CREATININE 0.98  --   --   --   --   --   --   ?TROPONINIHS 755*  --  757*  --   --   --   --   ? < > = values in this interval not displayed.  ? ? ? ?Estimated Creatinine Clearance: 65.2 mL/min (by C-G formula based on SCr of 0.98 mg/dL). ? ?Assessment: ?83 yoM with hx CAD admitted with CP and elevated troponins. Pharmacy consulted to dose IV heparin. Heparin level this morning was 0.33, so heparin 800 units/hr was continued. Confirmatory heparin level was subtherapeutic at 0.25. No issues noted per RN. ? ?Goal of Therapy:  ?Heparin level 0.3-0.7 units/ml ?Monitor platelets by anticoagulation protocol: Yes ?  ?Plan:  ?Increase to heparin 950 units/hr ?8h heparin level ?F/u after cath lab ?Monitor for s/sx of bleeding ? ?Thank you for including pharmacy in the care of this patient. ? ?Zenaida Deed, PharmD ?PGY1 Acute Care Pharmacy Resident  ?Phone: [phone] ?03/25/2022  1:17 PM ? ?Please check [email or web address] for unit-specific pharmacy phone numbers. ? ? ? ?

## 2022-03-26 ENCOUNTER — Encounter (HOSPITAL_COMMUNITY): Payer: Self-pay | Admitting: Interventional Cardiology

## 2022-03-26 ENCOUNTER — Other Ambulatory Visit (HOSPITAL_COMMUNITY): Payer: Self-pay

## 2022-03-26 DIAGNOSIS — I214 Non-ST elevation (NSTEMI) myocardial infarction: Secondary | ICD-10-CM | POA: Diagnosis not present

## 2022-03-26 LAB — BASIC METABOLIC PANEL
Anion gap: 9 (ref 5–15)
BUN: 15 mg/dL (ref 8–23)
CO2: 30 mmol/L (ref 22–32)
Calcium: 9.3 mg/dL (ref 8.9–10.3)
Chloride: 100 mmol/L (ref 98–111)
Creatinine, Ser: 1.06 mg/dL (ref 0.61–1.24)
GFR, Estimated: >60 mL/min (ref >60)
Glucose, Bld: 104 mg/dL — ABNORMAL HIGH (ref 70–99)
Potassium: 4.1 mmol/L (ref 3.5–5.1)
Sodium: 139 mmol/L (ref 135–145)

## 2022-03-26 LAB — CBC
HCT: 42.6 % (ref 39.0–52.0)
Hemoglobin: 14 g/dL (ref 13.0–17.0)
MCH: 31.7 pg (ref 26.0–34.0)
MCHC: 32.9 g/dL (ref 30.0–36.0)
MCV: 96.6 fL (ref 80.0–100.0)
Platelets: 135 10*3/uL — ABNORMAL LOW (ref 150–400)
RBC: 4.41 MIL/uL (ref 4.22–5.81)
RDW: 13.2 % (ref 11.5–15.5)
WBC: 7.5 10*3/uL (ref 4.0–10.5)
nRBC: 0 % (ref 0.0–0.2)

## 2022-03-26 LAB — TROPONIN I (HIGH SENSITIVITY): Troponin I (High Sensitivity): 647 ng/L (ref <18)

## 2022-03-26 MED FILL — Heparin Sod (Porcine)-NaCl IV Soln 1000 Unit/500ML-0.9%: INTRAVENOUS | Qty: 1000 | Status: AC

## 2022-03-26 NOTE — TOC Benefit Eligibility Note (Signed)
Patient Advocate Encounter ? ?Insurance verification completed.   ? ?The patient is currently admitted and upon discharge could be taking Brilinta 90 mg. ? ?The current 30 day co-pay is, $483.52 due to a $505.00 deductible.  ? ?The patient is currently admitted and upon discharge could be taking Jardiance 10 mg. ? ?The current 30 day co-pay is, $552.00 due to a $505.00 deductible.  ? ?The patient is currently admitted and upon discharge could be taking Farxiga 10 mg. ? ?Non Formulary ? ?The patient is insured through [place]  ? ? ?Lyndel Safe, CPhT ?Pharmacy Patient Advocate Specialist ?[place] Patient Advocate Team ?Direct Number: [phone]  Fax: [phone] ? ? ? ? ? ?  ?

## 2022-03-26 NOTE — Progress Notes (Signed)
? ?Progress Note ? ?Patient Name: Andre Miranda ?Date of Encounter: 03/26/2022 ? ?[facility] Cardiologist: Sinclair Grooms, MD  ? ?Subjective  ? ?No chest pain. Dyspnea improved.  ? ?Inpatient Medications  ?  ?Scheduled Meds: ? allopurinol  300 mg Oral Daily  ? aspirin  81 mg Oral Daily  ? Chlorhexidine Gluconate Cloth  6 each Topical Daily  ? clopidogrel  75 mg Oral Q breakfast  ? enoxaparin (LOVENOX) injection  40 mg Subcutaneous Q24H  ? famotidine  20 mg Oral QAC breakfast  ? furosemide  40 mg Oral Daily  ? isosorbide mononitrate  90 mg Oral Daily  ? metoprolol tartrate  25 mg Oral BID  ? pantoprazole  40 mg Oral BID  ? ramipril  5 mg Oral QHS  ? rosuvastatin  20 mg Oral QPM  ? sodium chloride flush  3 mL Intravenous Q12H  ? ?Continuous Infusions: ? sodium chloride    ? nitroGLYCERIN Stopped (03/24/22 1300)  ? ?PRN Meds: ?sodium chloride, acetaminophen, hydrALAZINE, hydrOXYzine, nitroGLYCERIN, ondansetron (ZOFRAN) IV, sodium chloride flush, temazepam  ? ?Vital Signs  ?  ?Vitals:  ? 03/26/22 0500 03/26/22 0600 03/26/22 0700 03/26/22 0800  ?BP: 117/66 (!) 94/51 99/60   ?Pulse: 65 65 70   ?Resp: 18 (!) 23 20   ?Temp:    (!) 97.5 ?F (36.4 ?C)  ?TempSrc:    Axillary  ?SpO2: 97% 95% 96%   ?Weight:      ?Height:      ? ? ?Intake/Output Summary (Last 24 hours) at 03/26/2022 0840 ?Last data filed at 03/26/2022 0600 ?Gross per 24 hour  ?Intake 1235.04 ml  ?Output 2125 ml  ?Net -889.96 ml  ? ? ?  03/25/2022  ?  5:00 PM 03/25/2022  ?  1:07 AM 03/24/2022  ?  5:39 PM  ?Last 3 Weights  ?Weight (lbs) 211 lb 6.7 oz 203 lb 8 oz 205 lb 11.2 oz  ?Weight (kg) 95.9 kg 92.307 kg 93.305 kg  ?   ? ?Telemetry  ?  ?v paced - Personally Reviewed ? ?ECG  ? ?V paced- Personally Reviewed ? ?Physical Exam  ? ?General: Well developed, well nourished, NAD  ?HEENT: OP clear, mucus membranes moist  ?SKIN: warm, dry. No rashes. ?Neuro: No focal deficits  ?Musculoskeletal: Muscle strength 5/5 all ext  ?Psychiatric: Mood and affect normal  ?Neck:  No JVD ?Lungs:Clear bilaterally, no wheezes, rhonci, crackles ?Cardiovascular: Regular rate and rhythm. No murmurs, gallops or rubs. ?Abdomen:Soft. Bowel sounds present. Non-tender.  ?Extremities: No lower extremity edema.  ? ?Labs  ?  ?High Sensitivity Troponin:   ?Recent Labs  ?Lab 03/23/22 ?2026 03/23/22 ?2209 03/26/22 ?0233  ?TROPONINIHS 755* 757* 647*  ?   ?Chemistry ?Recent Labs  ?Lab 03/23/22 ?2026 03/25/22 ?1552 03/25/22 ?1617 03/25/22 ?1703 03/26/22 ?0130  ?NA 141   < > 139 139 139  ?K 4.1   < > 3.7 4.0 4.1  ?CL 103  --   --  101 100  ?CO2 30  --   --  25 30  ?GLUCOSE 108*  --   --  114* 104*  ?BUN 17  --   --  14 15  ?CREATININE 0.98  --   --  1.02 1.06  ?CALCIUM 9.2  --   --  9.1 9.3  ?GFRNONAA >60  --   --  >60 >60  ?ANIONGAP 8  --   --  13 9  ? < > = values in this interval not  displayed.  ?  ?Lipids  ?Recent Labs  ?Lab 03/24/22 ?0235  ?CHOL 114  ?TRIG 67  ?HDL 42  ?[place] 59  ?CHOLHDL 2.7  ?  ?Hematology ?Recent Labs  ?Lab 03/24/22 ?0235 03/25/22 ?0411 03/25/22 ?1552 03/25/22 ?1617 03/26/22 ?0130  ?WBC 7.3 5.7  --   --  7.5  ?RBC 4.23 4.55  --   --  4.41  ?HGB 13.5 14.4 15.0 13.9 14.0  ?HCT 40.4 43.3 44.0 41.0 42.6  ?MCV 95.5 95.2  --   --  96.6  ?MCH 31.9 31.6  --   --  31.7  ?MCHC 33.4 33.3  --   --  32.9  ?RDW 13.1 13.1  --   --  13.2  ?PLT 107* 114*  --   --  135*  ? ?Thyroid  ?Recent Labs  ?Lab 03/24/22 ?0235  ?TSH 5.969*  ?  ?BNP ?Recent Labs  ?Lab 03/24/22 ?0235  ?BNP 152.7*  ?  ?DDimer No results for input(s): DDIMER in the last 168 hours.  ? ?Radiology  ?  ?CARDIAC CATHETERIZATION ? ?Result Date: 03/25/2022 ?CONCLUSIONS: 95% diffuse in-stent restenosis in the saphenous vein graft to circumflex.  Successful angioplasty and restenting using a 4.0 x 22 mm Onyx postdilated with a 4.5 mm in diameter [place] balloon to 8 atm (4.32 mm in diameter). Patent RIMA to distal RCA Patent LIMA to LAD Native coronary is not selectively engaged. Acute pulmonary edema during the procedure requiring IV Lasix, IV  nitroglycerin, and BiPAP to rescue. RECOMMENDATIONS: Continue aspirin and Plavix [place] BiPAP as tolerated Repeat IV Lasix later this evening Track kidney function.  ? ?ECHOCARDIOGRAM COMPLETE ? ?Result Date: 03/24/2022 ?   ECHOCARDIOGRAM REPORT   Patient Name:   Andre Miranda Date of Exam: 03/24/2022 Medical Rec #:  [id number]          Height:       70.0 in Accession #:    [id number]         Weight:       207.0 lb Date of Birth:  May 10, 1936          BSA:          2.118 m? Patient Age:    22 years           BP:           164/80 mmHg Patient Gender: M                  HR:           54 bpm. Exam Location:  Inpatient Procedure: 2D Echo, Cardiac Doppler, Color Doppler and Intracardiac            Opacification Agent Indications:    NSTEMI  History:        Patient has prior history of Echocardiogram examinations, most                 recent 09/11/2019. CAD, Signs/Symptoms:Murmur; Risk                 Factors:Dyslipidemia and Hypertension. 11/02/19 TAVR.  Sonographer:    [place] Referring Phys: [id number] [place]  1. Left ventricular ejection fraction, by estimation, is 50 to 55%. The left ventricle has low normal function. The left ventricle demonstrates regional wall motion abnormalities (see scoring diagram/findings for description). There is mild left ventricular hypertrophy. Left ventricular diastolic parameters are consistent with Grade I diastolic dysfunction (impaired relaxation).  2. Right ventricular systolic function  is normal. The right ventricular size is normal.  3. The mitral valve is normal in structure. No evidence of mitral valve regurgitation. No evidence of mitral stenosis. Moderate mitral annular calcification.  4. The aortic valve has been replaced with TAVR 67m Direct Flow Medical     . Aortic valve regurgitation is not visualized. No aortic stenosis is present. Procedure Date: 11/02/19. Aortic valve mean gradient measures 4.0 mmHg.  5. The inferior vena cava is normal in  size with greater than 50% respiratory variability, suggesting right atrial pressure of 3 mmHg. FINDINGS  Left Ventricle: Left ventricular ejection fraction, by estimation, is 50 to 55%. The left ventricle has low normal function. The left ventricle demonstrates regional wall motion abnormalities. The left ventricular internal cavity size was normal in size. There is mild left ventricular hypertrophy. Left ventricular diastolic parameters are consistent with Grade I diastolic dysfunction (impaired relaxation).  LV Wall Scoring: The mid anteroseptal segment and mid inferoseptal segment are hypokinetic. Right Ventricle: The right ventricular size is normal. No increase in right ventricular wall thickness. Right ventricular systolic function is normal. Left Atrium: Left atrial size was normal in size. Right Atrium: Right atrial size was normal in size. Pericardium: There is no evidence of pericardial effusion. Mitral Valve: The mitral valve is normal in structure. Moderate mitral annular calcification. No evidence of mitral valve regurgitation. No evidence of mitral valve stenosis. MV peak gradient, 3.3 mmHg. The mean mitral valve gradient is 1.0 mmHg. Tricuspid Valve: The tricuspid valve is normal in structure. Tricuspid valve regurgitation is trivial. No evidence of tricuspid stenosis. Aortic Valve: The aortic valve has been repaired/replaced. Aortic valve regurgitation is not visualized. No aortic stenosis is present. Aortic valve mean gradient measures 4.0 mmHg. Aortic valve peak gradient measures 8.1 mmHg. Aortic valve area, by VTI measures 2.52 cm?.Marland KitchenThere is a 27 mm Direct Flow Medical valve present in the aortic position. Pulmonic Valve: The pulmonic valve was normal in structure. Pulmonic valve regurgitation is not visualized. No evidence of pulmonic stenosis. Aorta: The aortic root is normal in size and structure. Venous: The inferior vena cava is normal in size with greater than 50% respiratory variability,  suggesting right atrial pressure of 3 mmHg. IAS/Shunts: No atrial level shunt detected by color flow Doppler.  LEFT VENTRICLE PLAX 2D LVIDd:         5.20 cm   Diastology LVIDs:         3.90 cm   LV e' medial

## 2022-03-26 NOTE — Progress Notes (Signed)
CARDIAC REHAB PHASE I  ? ?PRE:  Rate/Rhythm: 60 SR pacing ? ?  BP: sitting 113/73 ? ?  SaO2: 94 RA ? ?MODE:  Ambulation: 400 ft  ? ?POST:  Rate/Rhythm: 93 pacing ? ?  BP: sitting 183/73  ? ?  SaO2: 98 Ra ? ?Pt generally anxious. Talks about feeling SOB, sounds like Brilinta effect. Encouraged caffeine tonight with meds. Able to walk without c/o, talked while we walked. To recliner. BP elevated. No real c/o SOB or CP.  ? ?Discussed with pt and daughter MI, stent, restrictions, Brilinta, and CRPII. Pt receptive. Will refer to [place]. Will f/u tomorrow. ?[id number]  ? ?Yves Dill CES, ACSM ?03/26/2022 ?3:25 PM ? ? ? ? ?

## 2022-03-27 ENCOUNTER — Telehealth: Payer: Self-pay | Admitting: Interventional Cardiology

## 2022-03-27 DIAGNOSIS — E876 Hypokalemia: Secondary | ICD-10-CM

## 2022-03-27 DIAGNOSIS — I214 Non-ST elevation (NSTEMI) myocardial infarction: Secondary | ICD-10-CM | POA: Diagnosis not present

## 2022-03-27 DIAGNOSIS — Z952 Presence of prosthetic heart valve: Secondary | ICD-10-CM | POA: Diagnosis not present

## 2022-03-27 DIAGNOSIS — I10 Essential (primary) hypertension: Secondary | ICD-10-CM | POA: Diagnosis not present

## 2022-03-27 LAB — CBC
HCT: 40.5 % (ref 39.0–52.0)
Hemoglobin: 13.8 g/dL (ref 13.0–17.0)
MCH: 32.2 pg (ref 26.0–34.0)
MCHC: 34.1 g/dL (ref 30.0–36.0)
MCV: 94.6 fL (ref 80.0–100.0)
Platelets: 111 10*3/uL — ABNORMAL LOW (ref 150–400)
RBC: 4.28 MIL/uL (ref 4.22–5.81)
RDW: 13.1 % (ref 11.5–15.5)
WBC: 5.4 10*3/uL (ref 4.0–10.5)
nRBC: 0 % (ref 0.0–0.2)

## 2022-03-27 LAB — BASIC METABOLIC PANEL
Anion gap: 9 (ref 5–15)
BUN: 18 mg/dL (ref 8–23)
CO2: 28 mmol/L (ref 22–32)
Calcium: 9.1 mg/dL (ref 8.9–10.3)
Chloride: 100 mmol/L (ref 98–111)
Creatinine, Ser: 0.9 mg/dL (ref 0.61–1.24)
GFR, Estimated: >60 mL/min (ref >60)
Glucose, Bld: 86 mg/dL (ref 70–99)
Potassium: 3.5 mmol/L (ref 3.5–5.1)
Sodium: 137 mmol/L (ref 135–145)

## 2022-03-27 LAB — T4, FREE: Free T4: 1.24 ng/dL — ABNORMAL HIGH (ref 0.61–1.12)

## 2022-03-27 MED ORDER — ROSUVASTATIN CALCIUM 20 MG PO TABS: 20.0000 mg | ORAL_TABLET | Freq: Every day | ORAL | 3 refills | Status: AC

## 2022-03-27 MED ORDER — ROSUVASTATIN CALCIUM 10 MG PO TABS: 20.0000 mg | ORAL_TABLET | Freq: Every evening | ORAL | 1 refills | Status: DC

## 2022-03-27 MED ORDER — NITROGLYCERIN 0.4 MG SL SUBL: 0.4000 mg | SUBLINGUAL_TABLET | SUBLINGUAL | 2 refills | Status: AC | PRN

## 2022-03-27 MED ORDER — POTASSIUM CHLORIDE CRYS ER 20 MEQ PO TBCR
40.0000 meq | EXTENDED_RELEASE_TABLET | Freq: Once | ORAL | Status: AC
  Administered 2022-03-27: 40 meq via ORAL
  Filled 2022-03-27: qty 2

## 2022-03-27 MED ORDER — ISOSORBIDE MONONITRATE ER 30 MG PO TB24: 90.0000 mg | ORAL_TABLET | Freq: Every day | ORAL | 3 refills | Status: AC

## 2022-03-27 NOTE — Telephone Encounter (Signed)
Pt c/o medication issue: ? ?1. Name of Medication: amlodipine ? ?2. How are you currently taking this medication (dosage and times per day)?   ? ?3. Are you having a reaction (difficulty breathing--STAT)?   ? ?4. What is your medication issue? Patient was calling bout in because in the hospital he was told to start taking this medication. But it wasn't on the discharge paper  ? ?

## 2022-03-27 NOTE — Progress Notes (Signed)
? ?Progress Note ? ?Patient Name: Andre Miranda ?Date of Encounter: 03/27/2022 ? ?[facility] Cardiologist: Sinclair Grooms, MD  ? ?Subjective  ? ?No chest pain or dyspnea.  ? ?Inpatient Medications  ?  ?Scheduled Meds: ? allopurinol  300 mg Oral Daily  ? aspirin  81 mg Oral Daily  ? Chlorhexidine Gluconate Cloth  6 each Topical Daily  ? clopidogrel  75 mg Oral Q breakfast  ? enoxaparin (LOVENOX) injection  40 mg Subcutaneous Q24H  ? famotidine  20 mg Oral QAC breakfast  ? furosemide  40 mg Oral Daily  ? isosorbide mononitrate  90 mg Oral Daily  ? metoprolol tartrate  25 mg Oral BID  ? pantoprazole  40 mg Oral BID  ? ramipril  5 mg Oral QHS  ? rosuvastatin  20 mg Oral QPM  ? sodium chloride flush  3 mL Intravenous Q12H  ? ?Continuous Infusions: ? sodium chloride    ? nitroGLYCERIN Stopped (03/24/22 1300)  ? ?PRN Meds: ?sodium chloride, acetaminophen, hydrALAZINE, hydrOXYzine, nitroGLYCERIN, ondansetron (ZOFRAN) IV, sodium chloride flush, temazepam  ? ?Vital Signs  ?  ?Vitals:  ? 03/26/22 2230 03/27/22 0007 03/27/22 0008 03/27/22 0417  ?BP:  (!) 163/70 (!) 166/71 (!) 141/60  ?Pulse:   (!) 56 61  ?Resp:   20 20  ?Temp:   97.6 ?F (36.4 ?C) 97.8 ?F (36.6 ?C)  ?TempSrc:   Oral Oral  ?SpO2: 97%  100% 99%  ?Weight:      ?Height:      ? ? ?Intake/Output Summary (Last 24 hours) at 03/27/2022 0823 ?Last data filed at 03/27/2022 4580 ?Gross per 24 hour  ?Intake 718 ml  ?Output 1158 ml  ?Net -440 ml  ? ? ?  03/26/2022  ? 12:41 PM 03/25/2022  ?  5:00 PM 03/25/2022  ?  1:07 AM  ?Last 3 Weights  ?Weight (lbs) 202 lb 9.6 oz 211 lb 6.7 oz 203 lb 8 oz  ?Weight (kg) 91.9 kg 95.9 kg 92.307 kg  ?   ? ?Telemetry  ?  ?V paced- Personally Reviewed ? ?ECG  ? ?No AM EKG ? ?Physical Exam  ? ?General: Well developed, well nourished, NAD  ?HEENT: OP clear, mucus membranes moist  ?SKIN: warm, dry. No rashes. ?Neuro: No focal deficits  ?Musculoskeletal: Muscle strength 5/5 all ext  ?Psychiatric: Mood and affect normal  ?Neck: No  JVD, ?Lungs:Clear bilaterally, no wheezes, rhonci, crackles ?Cardiovascular: Regular rate and rhythm. No murmurs, gallops or rubs. ?Abdomen:Soft. Bowel sounds present. Non-tender.  ?Extremities: No lower extremity edema.  ? ?Labs  ?  ?High Sensitivity Troponin:   ?Recent Labs  ?Lab 03/23/22 ?2026 03/23/22 ?2209 03/26/22 ?9983  ?TROPONINIHS 755* 757* 647*  ?   ?Chemistry ?Recent Labs  ?Lab 03/25/22 ?1703 03/26/22 ?0130 03/27/22 ?0410  ?NA 139 139 137  ?K 4.0 4.1 3.5  ?CL 101 100 100  ?CO2 25 30 28   ?GLUCOSE 114* 104* 86  ?BUN 14 15 18   ?CREATININE 1.02 1.06 0.90  ?CALCIUM 9.1 9.3 9.1  ?GFRNONAA >60 >60 >60  ?ANIONGAP 13 9 9   ?  ?Lipids  ?Recent Labs  ?Lab 03/24/22 ?0235  ?CHOL 114  ?TRIG 67  ?HDL 42  ?[place] 59  ?CHOLHDL 2.7  ?  ?Hematology ?Recent Labs  ?Lab 03/25/22 ?0411 03/25/22 ?1552 03/25/22 ?1617 03/26/22 ?0130 03/27/22 ?0410  ?WBC 5.7  --   --  7.5 5.4  ?RBC 4.55  --   --  4.41 4.28  ?HGB 14.4   < >  13.9 14.0 13.8  ?HCT 43.3   < > 41.0 42.6 40.5  ?MCV 95.2  --   --  96.6 94.6  ?MCH 31.6  --   --  31.7 32.2  ?MCHC 33.3  --   --  32.9 34.1  ?RDW 13.1  --   --  13.2 13.1  ?PLT 114*  --   --  135* 111*  ? < > = values in this interval not displayed.  ? ?  ?DDimer No results for input(s): DDIMER in the last 168 hours.  ? ?Radiology  ?  ?CARDIAC CATHETERIZATION ? ?Result Date: 03/25/2022 ?CONCLUSIONS: 95% diffuse in-stent restenosis in the saphenous vein graft to circumflex.  Successful angioplasty and restenting using a 4.0 x 22 mm Onyx postdilated with a 4.5 mm in diameter Winger balloon to 8 atm (4.32 mm in diameter). Patent RIMA to distal RCA Patent LIMA to LAD Native coronary is not selectively engaged. Acute pulmonary edema during the procedure requiring IV Lasix, IV nitroglycerin, and BiPAP to rescue. RECOMMENDATIONS: Continue aspirin and Plavix Wean BiPAP as tolerated Repeat IV Lasix later this evening Track kidney function.   ? ?Cardiac Studies  ? ?Echo 03/24/22: ? 1. Left ventricular ejection fraction, by  estimation, is 50 to 55%. The  ?left ventricle has low normal function. The left ventricle demonstrates  ?regional wall motion abnormalities (see scoring diagram/findings for  ?description). There is mild left  ?ventricular hypertrophy. Left ventricular diastolic parameters are  ?consistent with Grade I diastolic dysfunction (impaired relaxation).  ? 2. Right ventricular systolic function is normal. The right ventricular  ?size is normal.  ? 3. The mitral valve is normal in structure. No evidence of mitral valve  ?regurgitation. No evidence of mitral stenosis. Moderate mitral annular  ?calcification.  ? 4. The aortic valve has been replaced with TAVR 43m Direct Flow Medical  ?    . Aortic valve regurgitation is not visualized. No aortic stenosis is  ?present. Procedure Date: 11/02/19. Aortic valve mean gradient measures 4.0  ?mmHg.  ? 5. The inferior vena cava is normal in size with greater than 50%  ?respiratory variability, suggesting right atrial pressure of 3 mmHg.  ? ?Patient Profile  ?   ?86y.o. male with a history of CAD s/p CABG (LIMA-LAD, RIMA-RCA, and SVG-RI/OM) in 2003 with subsequent stenting to SVG-RN/OM in 03/2021, [facility]/p repeat TAVR prosthesis in 138/1017complicated by complete heart block requiring leadless pacemaker, chronic diastolic CHF, hypertension, hyperlipidemia, obstructive sleep apnea on CPAP who was admitted on 03/23/2022 with NSTEMI after presenting with chest pain. Cardiac cath 03/25/22 with severe restenosis stented segment of SVG to OM.  ? ?Assessment & Plan  ?  ?CAD s/p CABG/NSTEMI: Doing well post PCI/stenting of the SVG to OM on 03/25/22. He has no chest pain today. Will continue DAPT with ASA/Plavix for at least 12 months. Continue beta blocker, Imdur, statin and Ace-inh.    ?Chronic diastolic CHF: No volume overload. Continue Lasix at home dose. ?Complete heart block: Paced this am ?Aortic valve disease:  s/p TAVR. Working well by echo 03/24/22. ?Hypokalemia: Will replace today. Will start KDur 20 meq daily at discharge.  ?Elevated TSH: Mild elevation. He has not taken Synthroid in the past. I will have him follow up with Dr. PJoylene Draft  ? ?Dispo: discharge home today. Follow up with Dr. STamala Julian ? ?For questions or updates, please contact [place]?Please consult [email or web address] for contact  info under  ? ?   ?Signed, ?Lauree Chandler, MD  ?03/27/2022, 8:23 AM   ? ?

## 2022-03-27 NOTE — Progress Notes (Signed)
Pt has orders to be discharged. Discharge instructions given and pt has no additional questions at this time. Medication regimen reviewed and pt educated. Pt verbalized understanding and has no additional questions. Telemetry box removed. IV removed and site in good condition. Pt stable and waiting for transportation. 

## 2022-03-27 NOTE — Progress Notes (Signed)
Mobility Specialist Progress Note: ? ? 03/27/22 0940  ?Mobility  ?Activity Ambulated with assistance in hallway  ?Level of Assistance Standby assist, set-up cues, supervision of patient - no hands on  ?Assistive Device None  ?Distance Ambulated (ft) 400 ft  ?Activity Response Tolerated well  ?$Mobility charge 1 Mobility  ? ?Pt eager for mobility session. No physical assistance required. Ambulated with slow, steady gait. Pt back in chair with all needs met, daughter present.   ? ?Nelta Numbers ?Acute Rehab ?Phone: [phone] ?Office Phone: [phone] ? ?

## 2022-03-27 NOTE — Care Management Important Message (Signed)
Important Message ? ?Patient Details  ?Name: Andre Miranda ?MRN: [id number] ?Date of Birth: 04/01/1936 ? ? ?Medicare Important Message Given:  Yes ? ? ? ? ?Shelda Altes ?03/27/2022, 10:09 AM ?

## 2022-03-27 NOTE — TOC Progression Note (Signed)
Transition of Care (TOC) - Progression Note  ? ? ?Patient Details  ?Name: DAJON LAZAR ?MRN: [id number] ?Date of Birth: 17-Sep-1936 ? ?Transition of Care (TOC) CM/SW Contact  ?Zenon Mayo, RN ?Phone Number: ?03/27/2022, 10:57 AM ? ?Clinical Narrative:    ?Patient is for dc today, he has no needs. His transport is here that will take him home. ? ? ?  ?  ? ?Expected Discharge Plan and Services ?  ?  ?  ?  ?  ?Expected Discharge Date: 03/27/22               ?  ?  ?  ?  ?  ?  ?  ?  ?  ?  ? ? ?Social Determinants of Health (SDOH) Interventions ?  ? ?Readmission Risk Interventions ?   ? View : No data to display.  ?  ?  ?  ? ? ?

## 2022-03-27 NOTE — Progress Notes (Signed)
Pt walked with MT earlier. Feels well. Had erroneously discussed Brilinta with him yesterday therefore clarified Plavix today with him and daughter. Discussed walking at home and CRPII.  ?[id number] ?Yves Dill CES, ACSM ?11:45 AM ?03/27/2022 ? ?

## 2022-03-27 NOTE — Discharge Summary (Addendum)
?Discharge Summary  ?  ?Patient ID: Andre Miranda ?MRN: [id number]; DOB: [birth year] ? ?Admit date: 03/23/2022 ?Discharge date: 03/27/2022 ? ?PCP:  Crist Infante, MD ?  ?[facility] Providers ?Cardiologist:  Sinclair Grooms, MD   { ? ? ?Discharge Diagnoses  ?  ?Principal Problem: ?  NSTEMI (non-ST elevated myocardial infarction) ([place]) ?Active Problems: ?  Hypertension ?  Obesity ?  Obstructive sleep apnea ?  CAD (coronary artery disease) ?  S/P TAVR (transcatheter aortic valve replacement) ?  Congestive heart failure ([place]) ? ? ? ?Diagnostic Studies/Procedures  ?  ?Left heart cath on 03/25/22: ? ?CONCLUSIONS: ?95% diffuse in-stent restenosis in the saphenous vein graft to circumflex.  Successful angioplasty and restenting using a 4.0 x 22 mm Onyx postdilated with a 4.5 mm in diameter Norman balloon to 8 atm (4.32 mm in diameter). ?Patent RIMA to distal RCA ?Patent LIMA to LAD ?Native coronary is not selectively engaged. ?Acute pulmonary edema during the procedure requiring IV Lasix, IV nitroglycerin, and BiPAP to rescue. ?  ?RECOMMENDATIONS: ?  ?Continue aspirin and Plavix ?Wean BiPAP as tolerated ?Repeat IV Lasix later this evening ?Track kidney function. ? ? ?Diagnostic ?Dominance: Right ? ? ? ?_____________ ? ?Echo from 03/24/22: ? ? 1. Left ventricular ejection fraction, by estimation, is 50 to 55%. The  ?left ventricle has low normal function. The left ventricle demonstrates  ?regional wall motion abnormalities (see scoring diagram/findings for  ?description). There is mild left  ?ventricular hypertrophy. Left ventricular diastolic parameters are  ?consistent with Grade I diastolic dysfunction (impaired relaxation).  ? 2. Right ventricular systolic function is normal. The right ventricular  ?size is normal.  ? 3. The mitral valve is normal in structure. No evidence of mitral valve  ?regurgitation. No evidence of mitral stenosis. Moderate mitral annular  ?calcification.  ? 4. The aortic valve has been replaced with  TAVR 44m Direct Flow Medical  ?    . Aortic valve regurgitation is not visualized. No aortic stenosis is  ?present. Procedure Date: 11/02/19. Aortic valve mean gradient measures 4.0  ?mmHg.  ? 5. The inferior vena cava is normal in size with greater than 50%  ?respiratory variability, suggesting right atrial pressure of 3 mmHg.  ? ? ?  ?History of Present Illness   ?  ?Per admission H&P on 03/23/2022: ? ?WMATTHEWJAMES PETRASEKis a 86y.o. male with CAD with prior CABG( LIMA-LAD, RIMA-RCA, & SVG-RI/OM in 2003, unstable angina treated with stent to SVG-RI/OM 03/2021), [facility] --> repeat TAVR prosthesis 153/66/44complicated by complete heart block requiring leadless pacemaker Micra AV (09/2019) , hyperlipidemia, HTN and diastolic heart failure, OSA on CPAP, who presented with chest pain. ? ?Mr. GLamagnahad sudden onset chest pain that woke him from sleep at 5 AM on 03/23/22.  He took 1 sublingual nitroglycerin however his pain was persistent.  He usually is able to get his chest pain to go away if he sits down however this time it did not.  He also reported associated shortness of breath but denied any nausea or emesis.  His pain lasted more than 5 minutes at which point he took sublingual nitroglycerin which did alleviate his symptoms. ? ?VS on arrival to the ED: P 65, BP 176/78, T 98.5, RR 18, O2 98%/RA.  ? ?Lab work notable for initial troponin is 755.  He was given aspirin 324 mg p.o. and started on heparin IV.  ?  ?During admission discussion  with Mr. Leoni and his family he reported some exertional angina which have been fairly stable up until recently although worsening since last year.  He had similar symptoms of shortness of breath prior to his coronary intervention in May 2022.  His predominant symptom during that time was dyspnea on exertion which he said dramatically improved following intervention.  03/23/22 he had mild shortness of breath however the  transient chest discomfort was fairly severe and was concerning to him prompting further evaluation.  During initial evaluation he was chest pain-free but was having intermittent shortness of breath while resting in bed. ?  ? ? ?Hospital Course  ?   ?Consultants: N/A   ? ?Non-STEMI ?CAD with hx of CABG 2003 ?-Presented with chest pain, resting shortness of breath ?- Hs trop 755 >757 >647  ?- EKG at admission revealed paced rhythm ?- Underwent left heart catheterization on 03/25/2022 showed 95% diffuse in-stent restenosis in the saphenous vein graft to circumflex, treated with angioplasty and restenting; patent RIMA to dis RCA, patent LIMA to LAD; patient developed acute pulmonary edema during procedure requiring BiPAP support +IV nitro and lasix ?-Echocardiogram 03/24/2022 showed EF 50 to 55%, low normal systolic function, hypokinetic mid anteroseptal segment and mid inferoseptal segment , mild LVH, grade 1 DD, normal RV, aortic mean gradient 4 mmHg and aortic valve peak gradient 8.1 mmHg ?- LDL 59; A1C 5.4%  ?- Medical therapy: Aspirin 81 mg + Plavix 75 mg for 1 year; continue metoprolol 25 mg twice daily, increased Imdur to 90 mg, ramipril 11m, and Crestor 280mdaily; PRN nitro; all new prescriptions had been sent to pharmacy today ?-Post cath and radial site care discussed in detail at bedside ?-Follow-up arranged on 04/09/22 at 2:20PM  ? ?Acute on chronic diastolic heart failure, resolved ?-Patient developed acute pulmonary edema during cardiac catheterization requiring BiPAP support plus IV nitro and Lasix ?-euvolemic now, will resume home dose Lasix 40 mg daily at time of discharge ?GDMT: continue  metoprolol, ramipril ? ?Hypertension ?-BP controlled, continued on metoprolol, imdur, ramipril  ? ?HLD ?- on statin  ? ?History of aortic stenosis s/p TAVR x2 ?-Echo repeated on 03/24/2022, prosthetic valve working well ? ?History of CHB s/p Micra AV ?-Paced rhythm, no acute issue ? ?Elevated TSH  ?- TSH elevated 5.969,  will add FT4 today, advised patient to follow-up with PCP on results ? ? ? ?Did the patient have an acute coronary syndrome (MI, NSTEMI, STEMI, etc) this admission?:  Yes                              ? ?AHA/ACC Clinical Performance & Quality Measures: ?Aspirin prescribed? - Yes ?ADP Receptor Inhibitor (Plavix/Clopidogrel, Brilinta/Ticagrelor or Effient/Prasugrel) prescribed (includes medically managed patients)? - Yes ?Beta Blocker prescribed? - Yes ?High Intensity Statin (Lipitor 40-8071mr Crestor 20-63m51mrescribed? - Yes ?EF assessed during THIS hospitalization? - Yes ?For EF <40%, was ACEI/ARB prescribed? - Yes ?For EF <40%, Aldosterone Antagonist (Spironolactone or Eplerenone) prescribed? - Not Applicable (EF >/= 40%)96%ardiac Rehab Phase II ordered (including medically managed patients)? - Yes  ? ?   ?_____________ ? ?Discharge Vitals ?Blood pressure (!) 117/58, pulse 74, temperature 97.8 ?F (36.6 ?C), temperature source Oral, resp. rate 18, height _0  (1.778 m), weight 91.9 kg, SpO2 95 %.  ?Filed Weights  ? 03/25/22 0107 03/25/22 1700 03/26/22 1241  ?Weight: 92.3 kg 95.9 kg 91.9 kg  ? ?Right groin site with dressing in  place, pedal pulse 2+, no bleeding or tenderness, no sensory loss  ? ? ?Labs & Radiologic Studies  ?  ?CBC ?Recent Labs  ?  03/26/22 ?0130 03/27/22 ?0410  ?WBC 7.5 5.4  ?HGB 14.0 13.8  ?HCT 42.6 40.5  ?MCV 96.6 94.6  ?PLT 135* 111*  ? ?Basic Metabolic Panel ?Recent Labs  ?  03/26/22 ?0130 03/27/22 ?0410  ?NA 139 137  ?K 4.1 3.5  ?CL 100 100  ?CO2 30 28  ?GLUCOSE 104* 86  ?BUN 15 18  ?CREATININE 1.06 0.90  ?CALCIUM 9.3 9.1  ? ?Liver Function Tests ?No results for input(s): AST, ALT, ALKPHOS, BILITOT, PROT, ALBUMIN in the last 72 hours. ?No results for input(s): LIPASE, AMYLASE in the last 72 hours. ?High Sensitivity Troponin:   ?Recent Labs  ?Lab 03/23/22 ?2026 03/23/22 ?2209 03/26/22 ?8144  ?TROPONINIHS 755* 757* 647*  ?  ?BNP ?Invalid input(s): POCBNP ?D-Dimer ?No results for input(s):  DDIMER in the last 72 hours. ?Hemoglobin A1C ?No results for input(s): HGBA1C in the last 72 hours. ?Fasting Lipid Panel ?No results for input(s): CHOL, HDL, LDLCALC, TRIG, CHOLHDL, LDLDIRECT in the last

## 2022-03-28 NOTE — Telephone Encounter (Signed)
Clarified that patient is to follow discharge medication list and to review at next follow-up appt. ? ?Patient;s daughter verbalized understanding. ?

## 2022-03-28 NOTE — Telephone Encounter (Signed)
Attempted to contact patient, no answer. Left detailed message (OK per DPR), explained that is amlodipine is not on discharge paper from hospital then it is not prescribed to take. Advised patient to follow the medication list he was discharged with. Provided office number for callback if any questions. ?

## 2022-03-28 NOTE — Telephone Encounter (Signed)
Pt's daughter returning call. Transferred to RN, Drue Dun.  ?

## 2022-04-01 NOTE — Progress Notes (Signed)
Not my patient

## 2022-04-02 ENCOUNTER — Telehealth (HOSPITAL_COMMUNITY): Payer: Self-pay

## 2022-04-02 ENCOUNTER — Encounter: Payer: Self-pay | Admitting: Interventional Cardiology

## 2022-04-02 DIAGNOSIS — E785 Hyperlipidemia, unspecified: Secondary | ICD-10-CM | POA: Diagnosis not present

## 2022-04-02 DIAGNOSIS — I2510 Atherosclerotic heart disease of native coronary artery without angina pectoris: Secondary | ICD-10-CM | POA: Diagnosis not present

## 2022-04-02 DIAGNOSIS — R946 Abnormal results of thyroid function studies: Secondary | ICD-10-CM | POA: Diagnosis not present

## 2022-04-02 DIAGNOSIS — D696 Thrombocytopenia, unspecified: Secondary | ICD-10-CM | POA: Diagnosis not present

## 2022-04-02 DIAGNOSIS — I10 Essential (primary) hypertension: Secondary | ICD-10-CM | POA: Diagnosis not present

## 2022-04-02 DIAGNOSIS — I5032 Chronic diastolic (congestive) heart failure: Secondary | ICD-10-CM | POA: Diagnosis not present

## 2022-04-02 DIAGNOSIS — I509 Heart failure, unspecified: Secondary | ICD-10-CM | POA: Diagnosis not present

## 2022-04-02 DIAGNOSIS — F32A Depression, unspecified: Secondary | ICD-10-CM | POA: Diagnosis not present

## 2022-04-02 DIAGNOSIS — M109 Gout, unspecified: Secondary | ICD-10-CM | POA: Diagnosis not present

## 2022-04-02 DIAGNOSIS — I214 Non-ST elevation (NSTEMI) myocardial infarction: Secondary | ICD-10-CM | POA: Diagnosis not present

## 2022-04-02 DIAGNOSIS — R7301 Impaired fasting glucose: Secondary | ICD-10-CM | POA: Diagnosis not present

## 2022-04-02 DIAGNOSIS — K2950 Unspecified chronic gastritis without bleeding: Secondary | ICD-10-CM | POA: Diagnosis not present

## 2022-04-02 NOTE — Telephone Encounter (Signed)
Pt insurance is active and benefits verified through Medicare a/b Co-pay 0, DED $226/$226 met, out of pocket 0/0 met, co-insurance 20%. no pre-authorization required. Passport, 04/02/2022@3 :06pm, REF# [id number] ?  ?KX MOD: HAVE USED 53 VISITS.. 72 LIFETIME SESSIONS ?  ?Will contact patient to see if he is interested in the Cardiac Rehab Program. If interested, patient will need to complete follow up appt. Once completed, patient will be contacted for scheduling upon review by the RN Navigator. ?

## 2022-04-02 NOTE — Telephone Encounter (Deleted)
Pt insurance is active and benefits verified through Medicare a/b Co-pay 0, DED $226/$226 met, out of pocket 0/0 met, co-insurance 20%. no pre-authorization required. Passport, 04/02/2022_0 :06pm, REF# [id number] ?  ?Will contact patient to see if he is interested in the Cardiac Rehab Program. If interested, patient will need to complete follow up appt. Once completed, patient will be contacted for scheduling upon review by the RN Navigator. ?

## 2022-04-02 NOTE — Telephone Encounter (Signed)
Called patient to see if he is interested in the Cardiac Rehab Program. Patient expressed interest. Explained scheduling process and went over insurance, patient verbalized understanding. Will contact patient for scheduling once f/u has been completed.  °

## 2022-04-08 NOTE — Progress Notes (Unsigned)
Office Visit    Patient Name: Andre Miranda Date of Encounter: 04/09/2022  Primary Care Provider:  Crist Infante, MD Primary Cardiologist:  Sinclair Grooms, MD Primary Electrophysiologist: None  Chief Complaint    Andre Miranda is a 86 y.o. male with PMH of CAD with prior CABG(LIMA LAD, RIMA RCA, & SVG RI/OM in 2003, unstable angina treated with stent to SVG RI/OM 03/2021, NSTEMI 03/2022 s/p PTCA/stent to SVG-Cx, aortic stenosis s/p TAVR 2016 at [facility], subsequent severe aortic regurgitation --> repeat TAVR prosthesis 10/12/2019, hyperlipidemia, prior difficult to control HTN, and chronic diastolic HF, OSA on CPAP presents today for [facility] visit following NSTEMI on 03/23/2022.  Past Medical History    Past Medical History:  Diagnosis Date   Aortic stenosis    MODERATE   Cancer ([place])    prostate cancer-radiation   Carotid arterial disease ([place])    Coronary artery disease    Dysuria    GERD (gastroesophageal reflux disease)    Gout    Hematuria    History of urinary retention    Hyperlipidemia    Hypertension    Obesity    Sleep apnea    uses CPAP nightly   Past Surgical History:  Procedure Laterality Date   AORTIC VALVE REPAIR  02/06/2015   Dr. Aline Brochure and Dr. Ysidro Evert  at [place]  09/26/2019   Procedure: BIOPSY;  Surgeon: Otis Brace, MD;  Location: [place];  Service: Gastroenterology;;   BIOPSY  03/18/2022   Procedure: BIOPSY;  Surgeon: Ronnette Juniper, MD;  Location: [facility];  Service: Gastroenterology;;  EGD and COLON   CAROTID ENDARTERECTOMY  02/16/2008   CHOLECYSTECTOMY  04/17/2002   COLONOSCOPY WITH PROPOFOL Left 09/26/2019   Procedure: COLONOSCOPY WITH PROPOFOL;  Surgeon: Otis Brace, MD;  Location: [place];  Service: Gastroenterology;  Laterality: Left;   COLONOSCOPY WITH PROPOFOL N/A 03/18/2022   Procedure: COLONOSCOPY WITH PROPOFOL;  Surgeon: Ronnette Juniper, MD;  Location: [facility];  Service: Gastroenterology;   Laterality: N/A;   CORONARY ARTERY BYPASS GRAFT  11/17/2001   LIMA TO THE LAD, RIMA TO THE RCA, AND A SAPHENOUS VEIN GRAFT TO THE INTERMEDIATE AND DISTAL LEFT CIRCUMFLEX   CORONARY STENT INTERVENTION N/A 04/03/2021   Procedure: CORONARY STENT INTERVENTION;  Surgeon: Belva Crome, MD;  Location: [place] CV [facility];  Service: Cardiovascular;  Laterality: N/A;   CORONARY STENT INTERVENTION N/A 03/25/2022   Procedure: CORONARY STENT INTERVENTION;  Surgeon: Belva Crome, MD;  Location: [place] [facility];  Service: Cardiovascular;  Laterality: N/A;   CORONARY/GRAFT ANGIOGRAPHY N/A 09/27/2019   Procedure: CORONARY/GRAFT ANGIOGRAPHY;  Surgeon: Belva Crome, MD;  Location: [place] [facility];  Service: Cardiovascular;  Laterality: N/A;   CORONARY/GRAFT ANGIOGRAPHY N/A 04/03/2021   Procedure: CORONARY/GRAFT ANGIOGRAPHY;  Surgeon: Belva Crome, MD;  Location: [place] [facility];  Service: Cardiovascular;  Laterality: N/A;   ESOPHAGOGASTRODUODENOSCOPY (EGD) WITH PROPOFOL Left 09/26/2019   Procedure: ESOPHAGOGASTRODUODENOSCOPY (EGD) WITH PROPOFOL;  Surgeon: Otis Brace, MD;  Location: [facility];  Service: Gastroenterology;  Laterality: Left;   ESOPHAGOGASTRODUODENOSCOPY (EGD) WITH PROPOFOL N/A 03/18/2022   Procedure: ESOPHAGOGASTRODUODENOSCOPY (EGD) WITH PROPOFOL;  Surgeon: Ronnette Juniper, MD;  Location: [facility];  Service: Gastroenterology;  Laterality: N/A;   INSERT / REPLACE / REMOVE PACEMAKER     LEFT HEART CATH AND CORS/GRAFTS ANGIOGRAPHY N/A 03/25/2022   Procedure: LEFT HEART CATH AND CORS/GRAFTS ANGIOGRAPHY;  Surgeon: Belva Crome, MD;  Location: [place] [facility];  Service: Cardiovascular;  Laterality: N/A;   ORIF ANKLE FRACTURE Right 02/14/2019   Procedure: OPEN REDUCTION INTERNAL FIXATION (ORIF) ANKLE FRACTURE;  Surgeon: Netta Cedars, MD;  Location: [place];  Service: Orthopedics;  Laterality: Right;   POLYPECTOMY  09/26/2019   Procedure: POLYPECTOMY;  Surgeon:  Otis Brace, MD;  Location: [facility];  Service: Gastroenterology;;   POLYPECTOMY  03/18/2022   Procedure: POLYPECTOMY;  Surgeon: Ronnette Juniper, MD;  Location: [facility];  Service: Gastroenterology;;   US ECHOCARDIOGRAPHY  08/17/2009   SHOWED A MEAN GRADIENT ACROSS IS AORTIC VALVE OF 24 MM OF MERCURY. HE HAD MODERATE LVH AND NORMAL LV FUNCTION    Allergies  Allergies  Allergen Reactions   Ibuprofen Hypertension    History of Present Illness    Andre Miranda has a PMH of CAD with prior CABG(LIMA LAD, RIMA RCA, & SVG RI/OM in 2003, unstable angina treated with stent to SVG RI/OM 03/2021, aortic stenosis s/p TAVR 2016 at [facility], subsequent severe aortic regurgitation --> repeat TAVR prosthesis 10/12/2019, hyperlipidemia, prior difficult to control HTN, and diastolic heart failure, OSA on CPAP, pulmonology nodules/granulomas followed by Dr. Annamaria Boots pulmonology.  On 03/23/2022 patient had sudden onset of chest pain with SOB that woke him up around 5 AM.  Pain was not relieved with sublingual nitroglycerin and persisted.  On arrival to ED patient's troponins were [phone] he was provided 324 mg aspirin and started on heparin.  Of note patient states that the symptoms of shortness of breath and exertional angina were similar to his coronary intervention in May 2022.  2D echo performed 5 8/23 with EF of 50-55%, low normal systolic function, hypokinetic with mid anterior septal segment and mid inferoseptal segment, mild LVH, grade 1 DD with aortic mean gradient of 4 mmHg and aortic valve peak of 8.1 mmHg.  LHC was performed on 03/25/22 with withwith 95% in stent restenosis in saphenous vein graft treated with DESx 1. Patient developed acute pulmonary edema during the procedure that required IV Lasix, BiPAP, and IV nitro post procedure.    Since last being seen in the office patient reports that since his NSTEMI 2 weeks ago that he is still experiencing angina with exertion that improves with  rest. This was present before recent intervention. When arriving to the office today and walking into the building patient states that he had to rest prior to getting all elevator due to increased shortness of breath.  EKG was completed in the office with no acute changes reassuring for nonischemic concerns. Patient is euvolemic on examination but during his catheterization procedure he required BiPAP and IV Lasix postprocedure.  I discussed briefly patient's condition with Dr. Tamala Julian who performed his catheterization 2 weeks ago.  Dr. Tamala Julian feels that patient should increase his Lasix and tracks daily weights.  Patient denies  palpitations, nausea, vomiting, dizziness, syncope, edema, weight gain, or early satiety.  He is currently using 2 pillows for sleep however he denies orthopnea at this time.  Home Medications    Current Outpatient Medications  Medication Sig Dispense Refill   acetaminophen (TYLENOL) 500 MG tablet Take 500 mg by mouth every 6 (six) hours as needed for moderate pain.     allopurinol (ZYLOPRIM) 300 MG tablet Take 300 mg by mouth daily.     aspirin EC 81 MG tablet Take 81 mg by mouth at bedtime.      Cholecalciferol (VITAMIN D3) 1000 UNITS CAPS Take 1,000 Units by mouth at bedtime.     clopidogrel (  PLAVIX) 75 MG tablet Take 1 tablet by mouth once daily with breakfast 90 tablet 0   escitalopram (LEXAPRO) 10 MG tablet Take 10 mg by mouth daily.     isosorbide mononitrate (IMDUR) 30 MG 24 hr tablet Take 3 tablets (90 mg total) by mouth daily. 90 tablet 3   metoprolol tartrate (LOPRESSOR) 25 MG tablet Take 1 tablet (25 mg total) by mouth 2 (two) times daily. 180 tablet 3   miconazole (ZEASORB-AF) 2 % powder Apply 1 application topically as needed for itching.     Multiple Vitamins-Minerals (CENTRUM SILVER) tablet Take 1 tablet by mouth daily with breakfast.     nitroGLYCERIN (NITROSTAT) 0.4 MG SL tablet Place 1 tablet (0.4 mg total) under the tongue every 5 (five) minutes x 3 doses as  needed for chest pain. 20 tablet 2   pantoprazole (PROTONIX) 40 MG tablet Take 1 tablet (40 mg total) by mouth 2 (two) times daily. 60 tablet 1   polyethylene glycol powder (GLYCOLAX/MIRALAX) 17 GM/SCOOP powder Take 17 g by mouth daily as needed for moderate constipation.     potassium chloride (KLOR-CON M) 10 MEQ tablet Take 1 tablet (10 mEq total) by mouth daily. 90 tablet 3   rosuvastatin (CRESTOR) 20 MG tablet Take 1 tablet (20 mg total) by mouth daily. 30 tablet 3   temazepam (RESTORIL) 15 MG capsule Take 15 mg by mouth at bedtime.     furosemide (LASIX) 40 MG tablet Take 1 tablet (40 mg total) by mouth 2 (two) times daily. 90 tablet 3   No current facility-administered medications for this visit.     Review of Systems  Please see the history of present illness.    (+) Fatigue and chest pain with activity (+) Shortness of breath with exertion  All other systems reviewed and are otherwise negative except as noted above.  Physical Exam    Wt Readings from Last 3 Encounters:  04/09/22 205 lb (93 kg)  03/26/22 202 lb 9.6 oz (91.9 kg)  03/18/22 207 lb (93.9 kg)   VS: Vitals:   04/09/22 1416  BP: 120/70  Pulse: (!) 59  SpO2: 95%  ,Body mass index is 29.41 kg/m.  Constitutional:      Appearance: Healthy appearance. Not in distress.  Neck:     Vascular: JVD normal.  Pulmonary:     Effort: Pulmonary effort is normal.     Breath sounds: No wheezing. No rales. Diminished in the bases Cardiovascular:     Normal rate. Regular rhythm. Normal S1. Normal S2.  Right hematoma at groin site without bruising or hematoma present    murmurs: There is no murmur.  Edema:    Peripheral edema absent.  Abdominal:     Palpations: Abdomen is soft non tender. There is no hepatomegaly.  Skin:    General: Skin is warm and dry.  Neurological:     General: No focal deficit present.     Mental Status: Alert and oriented to person, place and time.     Cranial Nerves: Cranial nerves are intact.   EKG/LABS/Other Studies Reviewed    ECG personally reviewed by me today -sinus bradycardia with first-degree AV block and left axis deviation with left bundle branch block with rate of 59 and no acute changes.  Lab Results  Component Value Date   WBC 5.4 03/27/2022   HGB 13.8 03/27/2022   HCT 40.5 03/27/2022   MCV 94.6 03/27/2022   PLT 111 (L) 03/27/2022   Lab Results  Component  Value Date   CREATININE 0.90 03/27/2022   BUN 18 03/27/2022   NA 137 03/27/2022   K 3.5 03/27/2022   CL 100 03/27/2022   CO2 28 03/27/2022   Lab Results  Component Value Date   ALT 19 09/10/2019   AST 27 09/10/2019   ALKPHOS 80 09/10/2019   BILITOT 1.5 (H) 09/10/2019   Lab Results  Component Value Date   CHOL 114 03/24/2022   HDL 42 03/24/2022   LDLCALC 59 03/24/2022   TRIG 67 03/24/2022   CHOLHDL 2.7 03/24/2022    Lab Results  Component Value Date   HGBA1C 5.4 04/22/2019    Assessment & Plan    1.NSTEMI/CAD: -s/p CABG 2003,NSTEMI on 03/23/2022 with DES x1 to 95% in stent restenosis in saphenous vein graft. -DAPT for 1 year with Plavix 75 mg and ASA 81 mg -Patient continues to be experiencing anginal symptoms and shortness of breath with exertion that is relieved with rest.  He has not required nitroglycerin and most current EKG reassuring for ischemic changes -> reviewed with Dr. Tamala Julian and we will plan for titration of diuresis -Continue Crestor 20 mg daily, metoprolol tartrate 25 mg twice daily, Imdur 90 mg daily  2.Acute on chronic diastolic CHF: -Patient's condition discussed with Dr. Tamala Julian who agrees with increasing patient's Lasix to 80 mg daily and discontinuing his ramipril due to lightheadedness. -Start Potassium 10 mEq daily -Low sodium diet, fluid restriction <2L, and daily weights encouraged. Educated to contact our office for weight gain of 2 lbs overnight or 5 lbs in one week.  -BMET in 1 week -BNP and CBC today to ensure Hgb stable -In the future may add Jardiance or  Entresto to current GDMT once volume status corrected.  3.HTN: -Blood pressure today was 120/70 well-controlled - follow with med changes above -Continue metoprolol tartrate 25 mg daily  4.HLD: -Most recent LDL was 59 on 03/2022 -Continue Crestor 20 mg daily  5.Aortic Stenosis: -s/p TAVR 01/2015 and redo of TAVR in 2020 at [facility] requiring leadless pacemaker due to complete heart block -2D echo completed 03/24/2022 with EF of 50-55% and mild LVH, and aortic valve mean gradient of 4.0 mmHg -Continue metoprolol 25 mg twice daily -When we call with labs, will reiterate SBE prophylaxis reminder    Cardiac Rehabilitation Eligibility Assessment  The patient is NOT ready to start cardiac rehabilitation due to: Other (Patient currently experiencing dyspnea on exertion)   Disposition: Follow-up with [name], MD or APP in 1 months    Medication Adjustments/Labs and Tests Ordered: Current medicines are reviewed at length with the patient today.  Concerns regarding medicines are outlined above.   Signed, Mable Fill, Marissa Nestle, NP 04/09/2022, 4:30 PM [place]

## 2022-04-09 ENCOUNTER — Encounter: Payer: Self-pay | Admitting: Nurse Practitioner

## 2022-04-09 ENCOUNTER — Ambulatory Visit (INDEPENDENT_AMBULATORY_CARE_PROVIDER_SITE_OTHER): Payer: Medicare Other | Admitting: Nurse Practitioner

## 2022-04-09 VITALS — BP 120/70 | HR 59 | Ht 70.0 in | Wt 205.0 lb

## 2022-04-09 DIAGNOSIS — E782 Mixed hyperlipidemia: Secondary | ICD-10-CM | POA: Diagnosis not present

## 2022-04-09 DIAGNOSIS — I5033 Acute on chronic diastolic (congestive) heart failure: Secondary | ICD-10-CM | POA: Diagnosis not present

## 2022-04-09 DIAGNOSIS — I5020 Unspecified systolic (congestive) heart failure: Secondary | ICD-10-CM

## 2022-04-09 DIAGNOSIS — I25118 Atherosclerotic heart disease of native coronary artery with other forms of angina pectoris: Secondary | ICD-10-CM

## 2022-04-09 DIAGNOSIS — I10 Essential (primary) hypertension: Secondary | ICD-10-CM | POA: Diagnosis not present

## 2022-04-09 DIAGNOSIS — I701 Atherosclerosis of renal artery: Secondary | ICD-10-CM

## 2022-04-09 DIAGNOSIS — I214 Non-ST elevation (NSTEMI) myocardial infarction: Secondary | ICD-10-CM | POA: Diagnosis not present

## 2022-04-09 DIAGNOSIS — I350 Nonrheumatic aortic (valve) stenosis: Secondary | ICD-10-CM | POA: Diagnosis not present

## 2022-04-09 MED ORDER — POTASSIUM CHLORIDE CRYS ER 10 MEQ PO TBCR: 10.0000 meq | EXTENDED_RELEASE_TABLET | Freq: Every day | ORAL | 3 refills | Status: AC

## 2022-04-09 MED ORDER — FUROSEMIDE 40 MG PO TABS: 40.0000 mg | ORAL_TABLET | Freq: Two times a day (BID) | ORAL | 3 refills | Status: DC

## 2022-04-09 NOTE — Patient Instructions (Signed)
Medication Instructions:   DISCONTINUE Ramipril  INCREASE Lasix one (1) tablet by mouth ( 40 mg) twice daily  START Potassium one (1) tablet by mouth (10 mEq) daily.   *If you need a refill on your cardiac medications before your next appointment, please call your pharmacy*   Lab Work:  TODAY!!!!! PRO BNP/CBC  Your physician recommends that you return for lab work on Thursday, June 1. You can come in on the day of your appointment anytime between 7:30-4:30.  If you have labs (blood work) drawn today and your tests are completely normal, you will receive your results only by: [place] (if you have MyChart) OR A paper copy in the mail If you have any lab test that is abnormal or we need to change your treatment, we will call you to review the results.   Testing/Procedures:  None ordered.   Follow-Up: At [facility], you and your health needs are our priority.  As part of our continuing mission to provide you with exceptional heart care, we have created designated Provider Care Teams.  These Care Teams include your primary Cardiologist (physician) and Advanced Practice Providers (APPs -  Physician Assistants and Nurse Practitioners) who all work together to provide you with the care you need, when you need it.  We recommend signing up for the patient portal called "MyChart".  Sign up information is provided on this After Visit Summary.  MyChart is used to connect with patients for Virtual Visits (Telemedicine).  Patients are able to view lab/test results, encounter notes, upcoming appointments, etc.  Non-urgent messages can be sent to your provider as well.   To learn more about what you can do with MyChart, go to [email or web address].    Your next appointment:   1 month(s)  The format for your next appointment:   In Person  Provider:   Sinclair Grooms, MD     Other Instructions  Recommend weighing daily and keeping a log. Please call our office if you have  weight gain of 2 pounds overnight or 5 pounds in 1 week.   Date  Time Weight                                             Important Information About Sugar

## 2022-04-10 ENCOUNTER — Telehealth: Payer: Self-pay | Admitting: Nurse Practitioner

## 2022-04-10 LAB — CBC
Hematocrit: 40.3 % (ref 37.5–51.0)
Hemoglobin: 13.8 g/dL (ref 13.0–17.7)
MCH: 32.1 pg (ref 26.6–33.0)
MCHC: 34.2 g/dL (ref 31.5–35.7)
MCV: 94 fL (ref 79–97)
Platelets: 152 10*3/uL (ref 150–450)
RBC: 4.3 x10E6/uL (ref 4.14–5.80)
RDW: 12.2 % (ref 11.6–15.4)
WBC: 7.8 10*3/uL (ref 3.4–10.8)

## 2022-04-10 LAB — PRO B NATRIURETIC PEPTIDE: NT-Pro BNP: 588 pg/mL — ABNORMAL HIGH (ref 0–486)

## 2022-04-10 NOTE — Telephone Encounter (Signed)
I spoke with patient and reviewed lab results with him.

## 2022-04-10 NOTE — Telephone Encounter (Signed)
Pt is returning call.  

## 2022-04-11 DIAGNOSIS — Z79899 Other long term (current) drug therapy: Secondary | ICD-10-CM

## 2022-04-11 DIAGNOSIS — F1921 Other psychoactive substance dependence, in remission: Secondary | ICD-10-CM

## 2022-04-18 ENCOUNTER — Other Ambulatory Visit: Payer: Medicare Other | Admitting: *Deleted

## 2022-04-18 DIAGNOSIS — E782 Mixed hyperlipidemia: Secondary | ICD-10-CM | POA: Diagnosis not present

## 2022-04-18 DIAGNOSIS — I214 Non-ST elevation (NSTEMI) myocardial infarction: Secondary | ICD-10-CM | POA: Diagnosis not present

## 2022-04-18 DIAGNOSIS — I5033 Acute on chronic diastolic (congestive) heart failure: Secondary | ICD-10-CM

## 2022-04-18 DIAGNOSIS — I10 Essential (primary) hypertension: Secondary | ICD-10-CM

## 2022-04-18 DIAGNOSIS — I25118 Atherosclerotic heart disease of native coronary artery with other forms of angina pectoris: Secondary | ICD-10-CM | POA: Diagnosis not present

## 2022-04-18 DIAGNOSIS — I350 Nonrheumatic aortic (valve) stenosis: Secondary | ICD-10-CM | POA: Diagnosis not present

## 2022-04-18 LAB — BASIC METABOLIC PANEL
BUN/Creatinine Ratio: 26 — ABNORMAL HIGH (ref 10–24)
BUN: 25 mg/dL (ref 8–27)
CO2: 31 mmol/L — ABNORMAL HIGH (ref 20–29)
Calcium: 9.7 mg/dL (ref 8.6–10.2)
Chloride: 98 mmol/L (ref 96–106)
Creatinine, Ser: 0.96 mg/dL (ref 0.76–1.27)
Glucose: 100 mg/dL — ABNORMAL HIGH (ref 70–99)
Potassium: 4.1 mmol/L (ref 3.5–5.2)
Sodium: 141 mmol/L (ref 134–144)
eGFR: 78 mL/min/{1.73_m2} (ref >59)

## 2022-04-24 ENCOUNTER — Telehealth: Payer: Self-pay | Admitting: Internal Medicine

## 2022-04-27 ENCOUNTER — Encounter: Payer: Self-pay | Admitting: Interventional Cardiology

## 2022-04-28 ENCOUNTER — Encounter: Payer: Self-pay | Admitting: Nurse Practitioner

## 2022-04-28 ENCOUNTER — Ambulatory Visit (INDEPENDENT_AMBULATORY_CARE_PROVIDER_SITE_OTHER): Payer: Medicare Other

## 2022-04-28 ENCOUNTER — Ambulatory Visit (INDEPENDENT_AMBULATORY_CARE_PROVIDER_SITE_OTHER): Payer: Medicare Other | Admitting: Nurse Practitioner

## 2022-04-28 VITALS — BP 136/80 | HR 57 | Temp 97.7°F | Ht 70.0 in | Wt 201.6 lb

## 2022-04-28 DIAGNOSIS — G4733 Obstructive sleep apnea (adult) (pediatric): Secondary | ICD-10-CM

## 2022-04-28 DIAGNOSIS — R0600 Dyspnea, unspecified: Secondary | ICD-10-CM | POA: Diagnosis not present

## 2022-04-28 DIAGNOSIS — R0609 Other forms of dyspnea: Secondary | ICD-10-CM

## 2022-04-28 DIAGNOSIS — I5042 Chronic combined systolic (congestive) and diastolic (congestive) heart failure: Secondary | ICD-10-CM | POA: Diagnosis not present

## 2022-04-28 DIAGNOSIS — R0602 Shortness of breath: Secondary | ICD-10-CM | POA: Diagnosis not present

## 2022-04-28 DIAGNOSIS — I2510 Atherosclerotic heart disease of native coronary artery without angina pectoris: Secondary | ICD-10-CM | POA: Diagnosis not present

## 2022-04-28 MED ORDER — FUROSEMIDE 40 MG PO TABS: 40.0000 mg | ORAL_TABLET | Freq: Two times a day (BID) | ORAL | 3 refills | Status: AC

## 2022-04-28 MED ORDER — ALBUTEROL SULFATE HFA 108 (90 BASE) MCG/ACT IN AERS: 2.0000 | INHALATION_SPRAY | Freq: Four times a day (QID) | RESPIRATORY_TRACT | 2 refills | Status: AC | PRN

## 2022-04-28 NOTE — Assessment & Plan Note (Addendum)
Reported excellent compliance. We attempted to get a download today but were unable. Called [employer] numerous times. Will attempt again. Continues to receive good benefit.

## 2022-04-28 NOTE — Assessment & Plan Note (Signed)
Worsening DOE and anorexia over the past few weeks. He does appear compensated on exam today. Follow up with cardiology as scheduled.

## 2022-04-28 NOTE — Progress Notes (Signed)
Please notify patient that his CXR today was clear and didn't show any evidence of fluid or infection. I am going to move forward with ordering CT chest for further evaluation of his DOE/weight loss. Thanks!

## 2022-04-28 NOTE — Patient Instructions (Signed)
Trial Albuterol inhaler 2 puffs every 6 hours as needed for shortness of breath or wheezing.  Pulmonary function testing scheduled today   Chest x ray today - we will notify you of any abnormal results and to determine next steps regarding possible CT scan  Follow up after PFTs with Dr. Annamaria Boots or Alanson Aly. If symptoms do not improve or worsen, please contact office for sooner follow up or seek emergency care.

## 2022-04-28 NOTE — Progress Notes (Signed)
$'@Patient'y$  ID: Andre Miranda, male    DOB: 12-11-1935, 86 y.o.   MRN: [id number]  Chief Complaint  Patient presents with   Acute Visit    Pt states that he has had increased SOB and a loss of appetite which has now been going on for the past 5 months but has been getting worse.    Referring provider: Crist Infante, MD  HPI: 86 year old male, former remote smoker followed for OSA on CPAP. He is a patient of Dr. Janee Morn and last seen in office on 10/30/2021. Past medical history significant for CAD/CABG/PAD with pacemaker, AS s/p TAVR, CHF, lung nodules/granulomas, prostate cancer, GERD, HLD, obesity.   TEST/EVENTS:  12/04/2021 CT chest without contrast: No LAD.  There is heavy calcification of the native coronary arteries.  There is a 4 mm part solid nodule in the superior segment of the right lower lobe which is stable when compared to 2014.  Multiple small calcified granulomas measuring 2 to 3 mm in diameter and unchanged since 2014.  8 mm calcified nodule in the medial right upper lobe also unchanged.  10/30/2021: OV with Dr. Annamaria Boots.  Doing well on CPAP without any concerns or complaints.  Download showed AHI 3.9 with current weight 222 lb. Doing well with temazepam with no significant side effects. Continue CPAP 5-15 as previously prescribed.   04/28/2022: Today-acute Patient presents today with wife.  Over the past 6 months, he has had increased shortness of breath and loss of appetite with unintentional weight loss of around 30 pounds.  He was hospitalized in early May with NSTEMI.  One of his previous stents was found to be occluded; underwent cardiac cath with new stent placement.  He has not noticed a significant improvement in his breathing or appetite since then.  Shortness of breath is primarily upon exertion; however he will occasionally have some at rest.  Does have a decent smoking history but quit back in the 80s.  He denies any hemoptysis, cough, wheezing, night sweats, fevers,  lower extremity edema, palpitations.  He has never been on any inhalers.  He did have pulmonary function testing back in 2013 with mild obstructive airways disease with reversibility.  He did have work-up with GI for the weight loss with EGD and colonoscopy. No identifiable cause.  As far as his CPAP goes, he has been doing well with this.  Wears it nightly without fail.  Denies any excessive daytime fatigue, morning grogginess or headaches, drowsy driving.  He did receive a new machine earlier this month.  We will attempt to get a download from this.  Allergies  Allergen Reactions   Ibuprofen Hypertension    Immunization History  Administered Date(s) Administered   Fluad Quad(high Dose 65+) 08/31/2020, 10/02/2021   Influenza Split 08/18/2011, 09/24/2015, 07/15/2016, 09/01/2017   Influenza,inj,Quad PF,6+ Mos 08/18/2013   Influenza-Unspecified 09/17/2014, 09/02/2018, 09/16/2019   PFIZER(Purple Top)SARS-COV-2 Vaccination 12/02/2019, 12/21/2019, 07/18/2020   PPD Test 06/15/2012   Pneumococcal Conjugate-13 05/03/2013   Tdap 04/06/2009   Zoster Recombinat (Shingrix) 08/24/2019    Past Medical History:  Diagnosis Date   Aortic stenosis    MODERATE   Cancer ([place])    prostate cancer-radiation   Carotid arterial disease ([place])    Coronary artery disease    Dysuria    GERD (gastroesophageal reflux disease)    Gout    Hematuria    History of urinary retention    Hyperlipidemia    Hypertension    Obesity  Sleep apnea    uses CPAP nightly    Tobacco History: Social History   Tobacco Use  Smoking Status Former   Packs/day: 2.00   Years: 10.00   Total pack years: 20.00   Types: Cigarettes   Quit date: 04/03/1971   Years since quitting: 51.1  Smokeless Tobacco Never   Counseling given: Not Answered   Outpatient Medications Prior to Visit  Medication Sig Dispense Refill   acetaminophen (TYLENOL) 500 MG tablet Take 500 mg by mouth every 6 (six) hours as needed for moderate  pain.     allopurinol (ZYLOPRIM) 300 MG tablet Take 300 mg by mouth daily.     aspirin EC 81 MG tablet Take 81 mg by mouth at bedtime.      Cholecalciferol (VITAMIN D3) 1000 UNITS CAPS Take 1,000 Units by mouth at bedtime.     clopidogrel (PLAVIX) 75 MG tablet Take 1 tablet by mouth once daily with breakfast 90 tablet 0   escitalopram (LEXAPRO) 10 MG tablet Take 10 mg by mouth daily.     furosemide (LASIX) 40 MG tablet Take 1 tablet (40 mg total) by mouth 2 (two) times daily. 180 tablet 3   isosorbide mononitrate (IMDUR) 30 MG 24 hr tablet Take 3 tablets (90 mg total) by mouth daily. 90 tablet 3   metoprolol tartrate (LOPRESSOR) 25 MG tablet Take 1 tablet (25 mg total) by mouth 2 (two) times daily. 180 tablet 3   miconazole (ZEASORB-AF) 2 % powder Apply 1 application topically as needed for itching.     Multiple Vitamins-Minerals (CENTRUM SILVER) tablet Take 1 tablet by mouth daily with breakfast.     nitroGLYCERIN (NITROSTAT) 0.4 MG SL tablet Place 1 tablet (0.4 mg total) under the tongue every 5 (five) minutes x 3 doses as needed for chest pain. 20 tablet 2   pantoprazole (PROTONIX) 40 MG tablet Take 1 tablet (40 mg total) by mouth 2 (two) times daily. 60 tablet 1   polyethylene glycol powder (GLYCOLAX/MIRALAX) 17 GM/SCOOP powder Take 17 g by mouth daily as needed for moderate constipation.     potassium chloride (KLOR-CON M) 10 MEQ tablet Take 1 tablet (10 mEq total) by mouth daily. 90 tablet 3   rosuvastatin (CRESTOR) 20 MG tablet Take 1 tablet (20 mg total) by mouth daily. 30 tablet 3   temazepam (RESTORIL) 15 MG capsule Take 15 mg by mouth at bedtime.     No facility-administered medications prior to visit.     Review of Systems:   Constitutional: No night sweats, fevers, chills, fatigue, or lassitude. +weight loss (30 lb) HEENT: No headaches, difficulty swallowing, tooth/dental problems, or sore throat. No sneezing, itching, ear ache, nasal congestion, or post nasal drip CV:  No  chest pain, orthopnea, PND, swelling in lower extremities, anasarca, dizziness, palpitations, syncope Resp: +shortness of breath with exertion, occasionally at rest. No excess mucus or change in color of mucus. No productive or non-productive. No hemoptysis. No wheezing.  No chest wall deformity GI:  +anorexia. No heartburn, indigestion, abdominal pain, nausea, vomiting, diarrhea, change in bowel habits, bloody stools.  GU: No dysuria, change in color of urine, urgency or frequency.  No flank pain, no hematuria  Skin: No rash, lesions, ulcerations MSK:  No joint pain or swelling.  No decreased range of motion.  No back pain. Neuro: No dizziness or lightheadedness.  Psych: No depression or anxiety. Mood stable.     Physical Exam:  BP 136/80 (BP Location: Right Arm, Patient Position: Sitting, Cuff  Size: Normal)   Pulse (!) 57   Temp 97.7 F (36.5 C) (Oral)   Ht '5\' 10"'$  (1.778 m)   Wt 201 lb 9.6 oz (91.4 kg)   SpO2 97% Comment: RA  BMI 28.93 kg/m   GEN: Pleasant, interactive, elderly; in no acute distress. HEENT:  Normocephalic and atraumatic. EACs patent bilaterally. TM pearly gray with present light reflex bilaterally. PERRLA. Sclera white. Nasal turbinates pink, moist and patent bilaterally. No rhinorrhea present. Oropharynx pink and moist, without exudate or edema. No lesions, ulcerations, or postnasal drip.  NECK:  Supple w/ fair ROM. No JVD present. Normal carotid impulses w/o bruits. Thyroid symmetrical with no goiter or nodules palpated. No lymphadenopathy.   CV: RRR, systolic murmur, no peripheral edema. Pulses intact, +2 bilaterally. No cyanosis, pallor or clubbing. PULMONARY:  Unlabored, regular breathing. Clear bilaterally A&P w/o wheezes/rales/rhonchi. No accessory muscle use. No dullness to percussion. GI: BS present and normoactive. Soft, non-tender to palpation. No organomegaly or masses detected. No CVA tenderness. MSK: No erythema, warmth or tenderness. Cap refil <2 sec all  extrem. No deformities or joint swelling noted.  Neuro: A/Ox3. No focal deficits noted.   Skin: Warm, no lesions or rashe Psych: Normal affect and behavior. Judgement and thought content appropriate.     Lab Results:  CBC    Component Value Date/Time   WBC 7.8 04/09/2022 1554   WBC 5.4 03/27/2022 0410   RBC 4.30 04/09/2022 1554   RBC 4.28 03/27/2022 0410   HGB 13.8 04/09/2022 1554   HCT 40.3 04/09/2022 1554   PLT 152 04/09/2022 1554   MCV 94 04/09/2022 1554   MCH 32.1 04/09/2022 1554   MCH 32.2 03/27/2022 0410   MCHC 34.2 04/09/2022 1554   MCHC 34.1 03/27/2022 0410   RDW 12.2 04/09/2022 1554   LYMPHSABS 1.6 06/24/2021 0255   MONOABS 0.6 06/24/2021 0255   EOSABS 0.2 06/24/2021 0255   BASOSABS 0.0 06/24/2021 0255    BMET    Component Value Date/Time   NA 141 04/18/2022 1109   K 4.1 04/18/2022 1109   CL 98 04/18/2022 1109   CO2 31 (H) 04/18/2022 1109   GLUCOSE 100 (H) 04/18/2022 1109   GLUCOSE 86 03/27/2022 0410   BUN 25 04/18/2022 1109   CREATININE 0.96 04/18/2022 1109   CALCIUM 9.7 04/18/2022 1109   GFRNONAA >60 03/27/2022 0410   GFRAA 84 08/24/2020 0936    BNP    Component Value Date/Time   BNP 152.7 (H) 03/24/2022 0235     Imaging:  DG Chest 2 View  Result Date: 04/28/2022 CLINICAL DATA:  Shortness of breath.  Coronary artery disease. EXAM: CHEST - 2 VIEW COMPARISON:  03/23/2022 FINDINGS: The heart size and mediastinal contours are within normal limits. Prior CABG and TAVR again noted. Cardiac loop recorder again seen. Both lungs are clear. The visualized skeletal structures are unremarkable. IMPRESSION: Stable exam. No active cardiopulmonary disease. Electronically Signed   By: Marlaine Hind M.D.   On: 04/28/2022 12:00          No data to display          No results found for: "NITRICOXIDE"      Assessment & Plan:   Dyspnea Progressive DOE, weight loss and anorexia since the beginning of this year. DOE worse over the past few weeks; we  did discuss that his CHF/CAD likely contributes. CXR today to evaluate for edema vs superimposed infection. Past PFTs from 2013 did have significant BD response so advised trial PRN  albuterol. He also has a significant smoking history, so could have a COPD component. However, given weight loss and anorexia advised we obtain CT chest for further evaluation to rule out underlying neoplasm.   Patient Instructions  Trial Albuterol inhaler 2 puffs every 6 hours as needed for shortness of breath or wheezing.  Pulmonary function testing scheduled today   Chest x ray today - we will notify you of any abnormal results and to determine next steps regarding possible CT scan  Follow up after PFTs with Dr. Annamaria Boots or Alanson Aly. If symptoms do not improve or worsen, please contact office for sooner follow up or seek emergency care.     Congestive heart failure ([place]) Worsening DOE and anorexia over the past few weeks. He does appear compensated on exam today. Follow up with cardiology as scheduled.   Obstructive sleep apnea Reported excellent compliance. We attempted to get a download today but were unable. Called [employer] numerous times. Will attempt again. Continues to receive good benefit.   I spent 35 minutes of dedicated to the care of this patient on the date of this encounter to include pre-visit review of records, face-to-face time with the patient discussing conditions above, post visit ordering of testing, clinical documentation with the electronic health record, making appropriate referrals as documented, and communicating necessary findings to members of the patients care team.  Clayton Bibles, NP 05/02/2022  Pt aware and understands NP's role.

## 2022-04-28 NOTE — Assessment & Plan Note (Addendum)
Progressive DOE, weight loss and anorexia since the beginning of this year. DOE worse over the past few weeks; we did discuss that his CHF/CAD likely contributes. CXR today to evaluate for edema vs superimposed infection. Past PFTs from 2013 did have significant BD response so advised trial PRN albuterol. He also has a significant smoking history, so could have a COPD component. However, given weight loss and anorexia advised we obtain CT chest for further evaluation to rule out underlying neoplasm.   Patient Instructions  Trial Albuterol inhaler 2 puffs every 6 hours as needed for shortness of breath or wheezing.  Pulmonary function testing scheduled today   Chest x ray today - we will notify you of any abnormal results and to determine next steps regarding possible CT scan  Follow up after PFTs with Dr. Annamaria Boots or Alanson Aly. If symptoms do not improve or worsen, please contact office for sooner follow up or seek emergency care.

## 2022-04-30 DIAGNOSIS — R946 Abnormal results of thyroid function studies: Secondary | ICD-10-CM | POA: Diagnosis not present

## 2022-04-30 DIAGNOSIS — E785 Hyperlipidemia, unspecified: Secondary | ICD-10-CM | POA: Diagnosis not present

## 2022-04-30 DIAGNOSIS — R7989 Other specified abnormal findings of blood chemistry: Secondary | ICD-10-CM | POA: Diagnosis not present

## 2022-04-30 DIAGNOSIS — I10 Essential (primary) hypertension: Secondary | ICD-10-CM | POA: Diagnosis not present

## 2022-04-30 MED ORDER — SPIRONOLACTONE 25 MG PO TABS: 12.5000 mg | ORAL_TABLET | Freq: Every day | ORAL | 3 refills | Status: AC

## 2022-04-30 NOTE — Telephone Encounter (Signed)
Belva Crome, MD  Precious Gilding, RN 11 minutes ago (8:20 AM)    Take spironolactone 12.5 mg on Monday, Wednesday, and Friday.  Continue to monitor blood pressure.  Bmet in 2 weeks    Called and spoke with patient. He understands that he is to remain on all current medications and that we are adding this to his regimen. He states he will begin today and is scheduled to see Dr Tamala Julian in 2 weeks, so will get the blood work done that day.

## 2022-05-02 ENCOUNTER — Encounter: Payer: Self-pay | Admitting: Nurse Practitioner

## 2022-05-02 ENCOUNTER — Telehealth: Payer: Self-pay

## 2022-05-02 NOTE — Telephone Encounter (Signed)
Called patient regarding MyChart message.   Patient states his BP has been elevated near or above 200/90  every morning during the past week or so - this morning it was 202/96.  Patient states BP does come down to "fairly normal" after taking morning medications, but he is concerned due to worsening SOB and weakness. He states he believes he may have had another MI about 10 days ago at 5:00 AM stating he had a similar experience as the one he went to the hospital for when he had his last MI.  Patient states he will send chart of BP readings via MyChart. Patient states he does not think he can wait to see Dr. Tamala Julian on 05/12/22 and requested earlier appt.  Scheduled appt for patient with DOD Dr. Irish Lack on 05/07/22 at 2:30PM.

## 2022-05-07 ENCOUNTER — Encounter: Payer: Self-pay | Admitting: Interventional Cardiology

## 2022-05-07 ENCOUNTER — Ambulatory Visit (HOSPITAL_COMMUNITY)
Admission: RE | Admit: 2022-05-07 | Discharge: 2022-05-07 | Disposition: A | Discharge status: Home / Self Care | Payer: Medicare Other | Source: Ambulatory Visit | Attending: Nurse Practitioner | Admitting: Nurse Practitioner

## 2022-05-07 ENCOUNTER — Ambulatory Visit (INDEPENDENT_AMBULATORY_CARE_PROVIDER_SITE_OTHER): Payer: Medicare Other | Admitting: Interventional Cardiology

## 2022-05-07 VITALS — BP 100/58 | HR 52 | Ht 70.0 in | Wt 199.0 lb

## 2022-05-07 DIAGNOSIS — R0602 Shortness of breath: Secondary | ICD-10-CM | POA: Diagnosis not present

## 2022-05-07 DIAGNOSIS — I25118 Atherosclerotic heart disease of native coronary artery with other forms of angina pectoris: Secondary | ICD-10-CM

## 2022-05-07 DIAGNOSIS — I701 Atherosclerosis of renal artery: Secondary | ICD-10-CM | POA: Diagnosis not present

## 2022-05-07 DIAGNOSIS — I5032 Chronic diastolic (congestive) heart failure: Secondary | ICD-10-CM | POA: Diagnosis not present

## 2022-05-07 DIAGNOSIS — I739 Peripheral vascular disease, unspecified: Secondary | ICD-10-CM

## 2022-05-07 DIAGNOSIS — E782 Mixed hyperlipidemia: Secondary | ICD-10-CM | POA: Diagnosis not present

## 2022-05-07 DIAGNOSIS — I10 Essential (primary) hypertension: Secondary | ICD-10-CM | POA: Diagnosis not present

## 2022-05-07 DIAGNOSIS — R5383 Other fatigue: Secondary | ICD-10-CM | POA: Diagnosis not present

## 2022-05-07 DIAGNOSIS — R911 Solitary pulmonary nodule: Secondary | ICD-10-CM | POA: Diagnosis not present

## 2022-05-07 DIAGNOSIS — R0600 Dyspnea, unspecified: Secondary | ICD-10-CM | POA: Diagnosis not present

## 2022-05-07 MED ORDER — IOHEXOL 300 MG/ML  SOLN
75.0000 mL | Freq: Once | INTRAMUSCULAR | Status: AC | PRN
  Administered 2022-05-07: 75 mL via INTRAVENOUS

## 2022-05-07 NOTE — Addendum Note (Signed)
Addended by: Thompson Grayer on: 05/07/2022 03:38 PM   Modules accepted: Orders

## 2022-05-07 NOTE — Progress Notes (Addendum)
Cardiology Office Note   Date:  05/07/2022   ID:  Andre Miranda, DOB [birth year], MRN [id number]  PCP:  Andre Infante, MD    No chief complaint on file.  CAD  Wt Readings from Last 3 Encounters:  05/07/22 199 lb (90.3 kg)  04/28/22 201 lb 9.6 oz (91.4 kg)  04/09/22 205 lb (93 kg)       History of Present Illness: Andre Miranda is a 86 y.o. male   with a hx of CAD with prior CABG(LIMA LAD, RIMA RCA, & SVG RI/OM in 2003, unstable angina treated with stent to SVG RI/OM 03/2021, aortic stenosis s/p TAVR 2016 [facility], subsequent severe aortic regurgitation --> repeat TAVR prosthesis 10/12/2019, hyperlipidemia, prior difficult to control HTN, and chronic diastolic HF.  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  Acute on chronic diastolic heart failure resolved after repeat TAVR, valve in valve.  Also had a pacer.    No angina as of 4/23.  Had some lightheadedness with wide variations in BP.  In early May 2023, he woke up on a Sunday morning with a chest pain.  He has chronic exertional chest pain.  He took a NTG and fell asleep.  He felt better.  His wife felt he looked bad.  He went to ER and ended up having a stent.  He has been short of breath over the past few days.  He feels tired, weak and has no energy. He has lost weight.  Appetite has decreased.      Past Medical History:  Diagnosis Date   Aortic stenosis    MODERATE   Cancer ([place])    prostate cancer-radiation   Carotid arterial disease ([place])    Coronary artery disease    Dysuria    GERD (gastroesophageal reflux disease)    Gout    Hematuria    History of urinary retention    Hyperlipidemia    Hypertension    Obesity    Sleep apnea    uses CPAP nightly    Past Surgical History:  Procedure Laterality Date   AORTIC VALVE REPAIR  02/06/2015   Dr. Aline Brochure and Dr. Ysidro Evert  at [facility]  09/26/2019   Procedure: BIOPSY;  Surgeon: Otis Brace, MD;  Location: [place];  Service: Gastroenterology;;   BIOPSY  03/18/2022   Procedure: BIOPSY;  Surgeon: Ronnette Juniper, MD;  Location: [facility];  Service: Gastroenterology;;  EGD and COLON   CAROTID ENDARTERECTOMY  02/16/2008   CHOLECYSTECTOMY  04/17/2002   COLONOSCOPY WITH PROPOFOL Left 09/26/2019   Procedure: COLONOSCOPY WITH PROPOFOL;  Surgeon: Otis Brace, MD;  Location: [place];  Service: Gastroenterology;  Laterality: Left;   COLONOSCOPY WITH PROPOFOL N/A 03/18/2022   Procedure: COLONOSCOPY WITH PROPOFOL;  Surgeon: Ronnette Juniper, MD;  Location: [facility];  Service: Gastroenterology;  Laterality: N/A;   CORONARY ARTERY BYPASS GRAFT  11/17/2001   LIMA TO THE LAD, RIMA TO THE RCA, AND A SAPHENOUS VEIN GRAFT TO THE INTERMEDIATE AND DISTAL LEFT CIRCUMFLEX   CORONARY STENT INTERVENTION N/A 04/03/2021   Procedure: CORONARY STENT INTERVENTION;  Surgeon: Belva Crome, MD;  Location: [place] [facility];  Service: Cardiovascular;  Laterality: N/A;   CORONARY STENT INTERVENTION N/A 03/25/2022   Procedure: CORONARY STENT INTERVENTION;  Surgeon: Belva Crome, MD;  Location: [place] [facility];  Service: Cardiovascular;  Laterality: N/A;   CORONARY/GRAFT ANGIOGRAPHY N/A 09/27/2019   Procedure: CORONARY/GRAFT ANGIOGRAPHY;  Surgeon: Tamala Julian,  Lynnell Dike, MD;  Location: [place] [facility];  Service: Cardiovascular;  Laterality: N/A;   CORONARY/GRAFT ANGIOGRAPHY N/A 04/03/2021   Procedure: CORONARY/GRAFT ANGIOGRAPHY;  Surgeon: Belva Crome, MD;  Location: [place] [facility];  Service: Cardiovascular;  Laterality: N/A;   ESOPHAGOGASTRODUODENOSCOPY (EGD) WITH PROPOFOL Left 09/26/2019   Procedure: ESOPHAGOGASTRODUODENOSCOPY (EGD) WITH PROPOFOL;  Surgeon: Otis Brace, MD;  Location: [facility];  Service: Gastroenterology;  Laterality: Left;   ESOPHAGOGASTRODUODENOSCOPY (EGD) WITH PROPOFOL N/A 03/18/2022   Procedure: ESOPHAGOGASTRODUODENOSCOPY (EGD) WITH PROPOFOL;  Surgeon: Ronnette Juniper, MD;  Location: [facility];  Service: Gastroenterology;  Laterality: N/A;   INSERT / REPLACE / REMOVE PACEMAKER     LEFT HEART CATH AND CORS/GRAFTS ANGIOGRAPHY N/A 03/25/2022   Procedure: LEFT HEART CATH AND CORS/GRAFTS ANGIOGRAPHY;  Surgeon: Belva Crome, MD;  Location: [place] [facility];  Service: Cardiovascular;  Laterality: N/A;   ORIF ANKLE FRACTURE Right 02/14/2019   Procedure: OPEN REDUCTION INTERNAL FIXATION (ORIF) ANKLE FRACTURE;  Surgeon: Netta Cedars, MD;  Location: [place];  Service: Orthopedics;  Laterality: Right;   POLYPECTOMY  09/26/2019   Procedure: POLYPECTOMY;  Surgeon: Otis Brace, MD;  [facility];  Service: Gastroenterology;;   POLYPECTOMY  03/18/2022   Procedure: POLYPECTOMY;  Surgeon: Ronnette Juniper, MD;  Location: [facility];  Service: Gastroenterology;;   US ECHOCARDIOGRAPHY  08/17/2009   SHOWED A MEAN GRADIENT ACROSS IS AORTIC VALVE OF 24 MM OF MERCURY. HE HAD MODERATE LVH AND NORMAL LV FUNCTION     Current Outpatient Medications  Medication Sig Dispense Refill   acetaminophen (TYLENOL) 500 MG tablet Take 500 mg by mouth every 6 (six) hours as needed for moderate pain.     albuterol (VENTOLIN HFA) 108 (90 Base) MCG/ACT inhaler Inhale 2 puffs into the lungs every 6 (six) hours as needed for wheezing or shortness of breath. 8 g 2   allopurinol (ZYLOPRIM) 300 MG tablet Take 300 mg by mouth daily.     aspirin EC 81 MG tablet Take 81 mg by mouth at bedtime.      Cholecalciferol (VITAMIN D3) 1000 UNITS CAPS Take 1,000 Units by mouth at bedtime.     clopidogrel (PLAVIX) 75 MG tablet Take 1 tablet by mouth once daily with breakfast 90 tablet 0   escitalopram (LEXAPRO) 10 MG tablet Take 10 mg by mouth daily.     furosemide (LASIX) 40 MG tablet Take 1 tablet (40 mg total) by mouth 2 (two) times daily. 180 tablet 3   isosorbide mononitrate (IMDUR) 30 MG 24 hr tablet Take 3 tablets (90 mg total) by mouth daily. 90 tablet 3   metoprolol tartrate (LOPRESSOR) 25  MG tablet Take 1 tablet (25 mg total) by mouth 2 (two) times daily. 180 tablet 3   miconazole (ZEASORB-AF) 2 % powder Apply 1 application topically as needed for itching.     Multiple Vitamins-Minerals (CENTRUM SILVER) tablet Take 1 tablet by mouth daily with breakfast.     nitroGLYCERIN (NITROSTAT) 0.4 MG SL tablet Place 1 tablet (0.4 mg total) under the tongue every 5 (five) minutes x 3 doses as needed for chest pain. 20 tablet 2   pantoprazole (PROTONIX) 40 MG tablet Take 1 tablet (40 mg total) by mouth 2 (two) times daily. 60 tablet 1   polyethylene glycol powder (GLYCOLAX/MIRALAX) 17 GM/SCOOP powder Take 17 g by mouth daily as needed for moderate constipation.     potassium chloride (KLOR-CON M) 10 MEQ tablet Take 1 tablet (10 mEq total) by  mouth daily. 90 tablet 3   rosuvastatin (CRESTOR) 20 MG tablet Take 1 tablet (20 mg total) by mouth daily. 30 tablet 3   spironolactone (ALDACTONE) 25 MG tablet Take 0.5 tablets (12.5 mg total) by mouth daily. (Patient taking differently: Take 12.5 mg by mouth 3 (three) times a week.) 45 tablet 3   telmisartan (MICARDIS) 20 MG tablet Take 20 mg by mouth daily.     temazepam (RESTORIL) 15 MG capsule Take 15 mg by mouth at bedtime.     No current facility-administered medications for this visit.    Allergies:   Ibuprofen    Social History:  The patient  reports that he quit smoking about 51 years ago. His smoking use included cigarettes. He has a 20.00 pack-year smoking history. He has never used smokeless tobacco. He reports current alcohol use of about 4.0 standard drinks of alcohol per week. He reports that he does not use drugs.   Family History:  The patient's family history includes Heart attack in his father; Pneumonia (age of onset: 94) in his mother.    ROS:  Please see the history of present illness.   Otherwise, review of systems are positive for DOE.   All other systems are reviewed and negative.    PHYSICAL EXAM: VS:  BP (!) 100/58    Pulse (!) 52   Ht 5' 10" (1.778 m)   Wt 199 lb (90.3 kg)   SpO2 96%   BMI 28.55 kg/m  , BMI Body mass index is 28.55 kg/m. GEN: Well nourished, well developed, in no acute distress HEENT: normal Neck: no JVD, carotid bruits, or masses Cardiac: RRR; no murmurs, rubs, or gallops,no edema  Respiratory:  clear to auscultation bilaterally, normal work of breathing GI: soft, nontender, nondistended, + BS MS: no deformity or atrophy Skin: warm and dry, no rash Neuro:  Strength and sensation are intact Psych: euthymic mood, full affect   EKG:   The ekg ordered today demonstrates V paced rhythm   Recent Labs: 06/24/2021: Magnesium 2.3 03/24/2022: B Natriuretic Peptide 152.7; TSH 5.969 04/09/2022: Hemoglobin 13.8; NT-Pro BNP 588; Platelets 152 04/18/2022: BUN 25; Creatinine, Ser 0.96; Potassium 4.1; Sodium 141   Lipid Panel    Component Value Date/Time   CHOL 114 03/24/2022 0235   TRIG 67 03/24/2022 0235   HDL 42 03/24/2022 0235   CHOLHDL 2.7 03/24/2022 0235   VLDL 13 03/24/2022 0235   LDLCALC 59 03/24/2022 0235     Other studies Reviewed: Additional studies/ records that were reviewed today with results demonstrating: labs reviewed.   ASSESSMENT AND PLAN:  CAD: DOE persistent.  Recent cath with PCI.  CHronic diastolic heart failure: appears euvolemic but still SHOB.  Check BNP, be met and CBC.  He is taking his furosemide and spironolactone as prescribed. Hyperlipidemia: LDL 59 in 5/23. Continue statin.  HTN: Early morning readings are high.  Readings in the afternoon are much better controlled.  I suggested that he take his evening dose of metoprolol with his bedtime medicines instead.  Hopefully, this would help with the early morning blood pressure. S/p TAVR: Valve in valve.  Needs SBE prophylaxis.  PAD: carotid disease and renal artery stenosis. Fatigue: check TSH.    Current medicines are reviewed at length with the patient today.  The patient concerns regarding his  medicines were addressed.  The following changes have been made:  No change  Labs/ tests ordered today include:  No orders of the defined types were placed in this  encounter.   Recommend 150 minutes/week of aerobic exercise Low fat, low carb, high fiber diet recommended  Disposition:   FU with Dr. Tamala Julian as scheduled   Signed, Larae Grooms, MD  05/07/2022 2:37 PM    [address], [place], [place]  [id number] Phone: [phone]; Fax: [phone]

## 2022-05-07 NOTE — Patient Instructions (Signed)
Medication Instructions:  Your physician has recommended you make the following change in your medication: Change evening time of metoprolol to bedtime.   *If you need a refill on your cardiac medications before your next appointment, please call your pharmacy*   Lab Work: Lab work to be done today--CBC, BMP, proBNP If you have labs (blood work) drawn today and your tests are completely normal, you will receive your results only by: [place] (if you have MyChart) OR A paper copy in the mail If you have any lab test that is abnormal or we need to change your treatment, we will call you to review the results.   Testing/Procedures: none   Follow-Up: At [facility], you and your health needs are our priority.  As part of our continuing mission to provide you with exceptional heart care, we have created designated Provider Care Teams.  These Care Teams include your primary Cardiologist (physician) and Advanced Practice Providers (APPs -  Physician Assistants and Nurse Practitioners) who all work together to provide you with the care you need, when you need it.  We recommend signing up for the patient portal called "MyChart".  Sign up information is provided on this After Visit Summary.  MyChart is used to connect with patients for Virtual Visits (Telemedicine).  Patients are able to view lab/test results, encounter notes, upcoming appointments, etc.  Non-urgent messages can be sent to your provider as well.   To learn more about what you can do with MyChart, go to [email or web address].    Your next appointment:   June 29,2023  The format for your next appointment:   In Person  Provider:   Sinclair Grooms, MD     Other Instructions    Important Information About Sugar

## 2022-05-08 DIAGNOSIS — E785 Hyperlipidemia, unspecified: Secondary | ICD-10-CM | POA: Diagnosis not present

## 2022-05-08 DIAGNOSIS — R7989 Other specified abnormal findings of blood chemistry: Secondary | ICD-10-CM | POA: Diagnosis not present

## 2022-05-08 DIAGNOSIS — I10 Essential (primary) hypertension: Secondary | ICD-10-CM | POA: Diagnosis not present

## 2022-05-08 LAB — CBC
Hematocrit: 44 % (ref 37.5–51.0)
Hemoglobin: 14.7 g/dL (ref 13.0–17.7)
MCH: 31.7 pg (ref 26.6–33.0)
MCHC: 33.4 g/dL (ref 31.5–35.7)
MCV: 95 fL (ref 79–97)
Platelets: 146 10*3/uL — ABNORMAL LOW (ref 150–450)
RBC: 4.64 x10E6/uL (ref 4.14–5.80)
RDW: 12.1 % (ref 11.6–15.4)
WBC: 6.7 10*3/uL (ref 3.4–10.8)

## 2022-05-08 LAB — TSH: TSH: 5.88 u[IU]/mL — ABNORMAL HIGH (ref 0.450–4.500)

## 2022-05-08 LAB — BASIC METABOLIC PANEL
BUN/Creatinine Ratio: 19 (ref 10–24)
BUN: 21 mg/dL (ref 8–27)
CO2: 31 mmol/L — ABNORMAL HIGH (ref 20–29)
Calcium: 9.5 mg/dL (ref 8.6–10.2)
Chloride: 96 mmol/L (ref 96–106)
Creatinine, Ser: 1.08 mg/dL (ref 0.76–1.27)
Glucose: 176 mg/dL — ABNORMAL HIGH (ref 70–99)
Potassium: 4 mmol/L (ref 3.5–5.2)
Sodium: 142 mmol/L (ref 134–144)
eGFR: 68 mL/min/{1.73_m2} (ref >59)

## 2022-05-08 LAB — PRO B NATRIURETIC PEPTIDE: NT-Pro BNP: 436 pg/mL (ref 0–486)

## 2022-05-12 ENCOUNTER — Other Ambulatory Visit: Payer: Medicare Other

## 2022-05-12 NOTE — Progress Notes (Signed)
Cardiology Office Note:    Date:  05/15/2022   ID:  Andre Miranda, DOB 1936/08/04, MRN [id number]  PCP:  Crist Infante, MD  Cardiologist:  Sinclair Grooms, MD   Referring MD: Crist Infante, MD   Chief Complaint  Patient presents with   Congestive Heart Failure   Hypertension   Follow-up    Weight loss physical deconditioning   Cardiac Valve Problem    History of Present Illness:    Andre Miranda is a 86 y.o. male with a hx of  CAD with prior CABG(LIMA LAD, RIMA RCA, & SVG RI/OM in 2003, unstable angina treated with stent to SVG RI/OM 03/2021, aortic stenosis s/p TAVR 2016 at [facility], subsequent severe aortic regurgitation --> repeat TAVR prosthesis 10/12/2019, hyperlipidemia, prior difficult to control HTN, and chronic diastolic HF.  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  Acute on chronic diastolic heart failure resolved after repeat TAVR.  Admitted with NSTEMI 03/23/2022.  Seen 04/09/2022 by E. Barbarann Ehlers, NP as TOC for NSTEMI  Seen 05/07/2022 by Dr. Vennie Homans DOE and later sent to ER for CT chest by Marland Kitchen, NP.  Saw Dr. Joylene Draft 05/12/2022.  He is back today for follow-up.  Not having chest pain.   He has extreme dyspnea on exertion and fatigue.  Does not have significant orthopnea there is no peripheral edema and denies PND.  No angina.  Blood pressures been measured and recorded in a use for today's appointment.  I reviewed reports of a chest CT with contrast where no acute abnormalities were noted.  He saw Dr. Illene Bolus a week ago he saw Ambrose Pancoast a month ago and saw PA at Dr. Silvestre Mesi office earlier this week.  Past Medical History:  Diagnosis Date   Aortic stenosis    MODERATE   Cancer ([place])    prostate cancer-radiation   Carotid arterial disease ([place])    Coronary artery disease    Dysuria    GERD (gastroesophageal reflux disease)    Gout    Hematuria    History of urinary retention    Hyperlipidemia    Hypertension    Obesity     Sleep apnea    uses CPAP nightly    Past Surgical History:  Procedure Laterality Date   AORTIC VALVE REPAIR  02/06/2015   Dr. Aline Brochure and Dr. Ysidro Evert  at [place]  09/26/2019   Procedure: BIOPSY;  Surgeon: Otis Brace, MD;  Location: [place];  Service: Gastroenterology;;   BIOPSY  03/18/2022   Procedure: BIOPSY;  Surgeon: Ronnette Juniper, MD;  Location: [facility];  Service: Gastroenterology;;  EGD and COLON   CAROTID ENDARTERECTOMY  02/16/2008   CHOLECYSTECTOMY  04/17/2002   COLONOSCOPY WITH PROPOFOL Left 09/26/2019   Procedure: COLONOSCOPY WITH PROPOFOL;  Surgeon: Otis Brace, MD;  Location: [place];  Service: Gastroenterology;  Laterality: Left;   COLONOSCOPY WITH PROPOFOL N/A 03/18/2022   Procedure: COLONOSCOPY WITH PROPOFOL;  Surgeon: Ronnette Juniper, MD;  Location: [facility];  Service: Gastroenterology;  Laterality: N/A;   CORONARY ARTERY BYPASS GRAFT  11/17/2001   LIMA TO THE LAD, RIMA TO THE RCA, AND A SAPHENOUS VEIN GRAFT TO THE INTERMEDIATE AND DISTAL LEFT CIRCUMFLEX   CORONARY STENT INTERVENTION N/A 04/03/2021   Procedure: CORONARY STENT INTERVENTION;  Surgeon: Belva Crome, MD;  Location: [place] [facility];  Service: Cardiovascular;  Laterality: N/A;   CORONARY STENT INTERVENTION N/A 03/25/2022   Procedure: CORONARY STENT INTERVENTION;  Surgeon: Tamala Julian,  Lynnell Dike, MD;  Location: [place] [facility];  Service: Cardiovascular;  Laterality: N/A;   CORONARY/GRAFT ANGIOGRAPHY N/A 09/27/2019   Procedure: CORONARY/GRAFT ANGIOGRAPHY;  Surgeon: Belva Crome, MD;  Location: [place] [facility];  Service: Cardiovascular;  Laterality: N/A;   CORONARY/GRAFT ANGIOGRAPHY N/A 04/03/2021   Procedure: CORONARY/GRAFT ANGIOGRAPHY;  Surgeon: Belva Crome, MD;  Location: [place] [facility];  Service: Cardiovascular;  Laterality: N/A;   ESOPHAGOGASTRODUODENOSCOPY (EGD) WITH PROPOFOL Left 09/26/2019   Procedure: ESOPHAGOGASTRODUODENOSCOPY (EGD) WITH PROPOFOL;  Surgeon:  Otis Brace, MD;  Location: [facility];  Service: Gastroenterology;  Laterality: Left;   ESOPHAGOGASTRODUODENOSCOPY (EGD) WITH PROPOFOL N/A 03/18/2022   Procedure: ESOPHAGOGASTRODUODENOSCOPY (EGD) WITH PROPOFOL;  Surgeon: Ronnette Juniper, MD;  Location: [facility];  Service: Gastroenterology;  Laterality: N/A;   INSERT / REPLACE / REMOVE PACEMAKER     LEFT HEART CATH AND CORS/GRAFTS ANGIOGRAPHY N/A 03/25/2022   Procedure: LEFT HEART CATH AND CORS/GRAFTS ANGIOGRAPHY;  Surgeon: Belva Crome, MD;  Location: [place] [facility];  Service: Cardiovascular;  Laterality: N/A;   ORIF ANKLE FRACTURE Right 02/14/2019   Procedure: OPEN REDUCTION INTERNAL FIXATION (ORIF) ANKLE FRACTURE;  Surgeon: Netta Cedars, MD;  Location: [place];  Service: Orthopedics;  Laterality: Right;   POLYPECTOMY  09/26/2019   Procedure: POLYPECTOMY;  Surgeon: Otis Brace, MD;  [facility]: [facility];  Service: Gastroenterology;;   POLYPECTOMY  03/18/2022   Procedure: POLYPECTOMY;  Surgeon: Ronnette Juniper, MD;  [facility]: [facility];  Service: Gastroenterology;;   US ECHOCARDIOGRAPHY  08/17/2009   SHOWED A MEAN GRADIENT ACROSS IS AORTIC VALVE OF 24 MM OF MERCURY. HE HAD MODERATE LVH AND NORMAL LV FUNCTION    Current Medications: Current Meds  Medication Sig   acetaminophen (TYLENOL) 500 MG tablet Take 500 mg by mouth every 6 (six) hours as needed for moderate pain.   albuterol (VENTOLIN HFA) 108 (90 Base) MCG/ACT inhaler Inhale 2 puffs into the lungs every 6 (six) hours as needed for wheezing or shortness of breath.   allopurinol (ZYLOPRIM) 300 MG tablet Take 300 mg by mouth daily.   aspirin EC 81 MG tablet Take 81 mg by mouth at bedtime.    Cholecalciferol (VITAMIN D3) 1000 UNITS CAPS Take 1,000 Units by mouth at bedtime.   clopidogrel (PLAVIX) 75 MG tablet Take 1 tablet by mouth once daily with breakfast   empagliflozin (JARDIANCE) 10 MG TABS tablet Take 1 tablet (10 mg total) by mouth daily before  breakfast.   escitalopram (LEXAPRO) 10 MG tablet Take 10 mg by mouth daily.   furosemide (LASIX) 40 MG tablet Take 1 tablet (40 mg total) by mouth 2 (two) times daily.   isosorbide mononitrate (IMDUR) 30 MG 24 hr tablet Take 3 tablets (90 mg total) by mouth daily.   metoprolol tartrate (LOPRESSOR) 25 MG tablet Take 1 tablet (25 mg total) by mouth 2 (two) times daily.   miconazole (ZEASORB-AF) 2 % powder Apply 1 application topically as needed for itching.   Multiple Vitamins-Minerals (CENTRUM SILVER) tablet Take 1 tablet by mouth daily with breakfast.   nitroGLYCERIN (NITROSTAT) 0.4 MG SL tablet Place 1 tablet (0.4 mg total) under the tongue every 5 (five) minutes x 3 doses as needed for chest pain.   pantoprazole (PROTONIX) 40 MG tablet Take 1 tablet (40 mg total) by mouth 2 (two) times daily.   polyethylene glycol powder (GLYCOLAX/MIRALAX) 17 GM/SCOOP powder Take 17 g by mouth daily as needed for moderate constipation.   potassium chloride (KLOR-CON M) 10  MEQ tablet Take 1 tablet (10 mEq total) by mouth daily.   rosuvastatin (CRESTOR) 20 MG tablet Take 1 tablet (20 mg total) by mouth daily.   spironolactone (ALDACTONE) 25 MG tablet Take 0.5 tablets (12.5 mg total) by mouth daily. (Patient taking differently: Take 12.5 mg by mouth 3 (three) times a week.)   temazepam (RESTORIL) 15 MG capsule Take 15 mg by mouth at bedtime.     Allergies:   Ibuprofen   Social History   Socioeconomic History   Marital status: Married    Spouse name: Not on file   Number of children: 6   Years of education: 16   Highest education level: Bachelor's degree (e.g., BA, AB, BS)  Occupational History   Occupation: Retired  Tobacco Use   Smoking status: Former    Packs/day: 2.00    Years: 10.00    Total pack years: 20.00    Types: Cigarettes    Quit date: 04/03/1971    Years since quitting: 51.1   Smokeless tobacco: Never  Vaping Use   Vaping Use: Never used  Substance and Sexual Activity   Alcohol use:  Yes    Alcohol/week: 4.0 standard drinks of alcohol    Types: 4 Cans of beer per week    Comment: social   Drug use: No   Sexual activity: Not on file  Other Topics Concern   Not on file  Social History Narrative   Not on file   Social Determinants of Health   Financial Resource Strain: Not on file  Food Insecurity: Not on file  Transportation Needs: Not on file  Physical Activity: Not on file  Stress: Not on file  Social Connections: Not on file     Family History: The patient's family history includes Heart attack in his father; Pneumonia (age of onset: 49) in his mother.  ROS:   Please see the history of present illness.    He and the family are concerned about his decline.  States his weight has been more stable.  Wife feels that she cannot take care of him at home.  Says that they have come close to bringing him to the emergency room.  All other systems reviewed and are negative.  EKGs/Labs/Other Studies Reviewed:    The following studies were reviewed today:  2D Doppler echocardiogram Mar 24, 2022: IMPRESSIONS   1. Left ventricular ejection fraction, by estimation, is 50 to 55%. The  left ventricle has low normal function. The left ventricle demonstrates  regional wall motion abnormalities (see scoring diagram/findings for  description). There is mild left  ventricular hypertrophy. Left ventricular diastolic parameters are  consistent with Grade I diastolic dysfunction (impaired relaxation).   2. Right ventricular systolic function is normal. The right ventricular  size is normal.   3. The mitral valve is normal in structure. No evidence of mitral valve  regurgitation. No evidence of mitral stenosis. Moderate mitral annular  calcification.   4. The aortic valve has been replaced with TAVR 35m [employer]      . Aortic valve regurgitation is not visualized. No aortic stenosis is  present. Procedure Date: 11/02/19. Aortic valve mean gradient measures 4.0   mmHg.   5. The inferior vena cava is normal in size with greater than 50%  respiratory variability, suggesting right atrial pressure of 3 mmHg.    CHEST CT 05/07/2022: IMPRESSION: Status post transcatheter aortic valve repair.   No acute pulmonary abnormality is noted.   Coronary artery calcifications  are noted suggesting coronary artery disease.   Aortic Atherosc is not repeated lerosis (ICD10-I70.0).  EKG:  EKG is not repeated  Recent Labs: 06/24/2021: Magnesium 2.3 03/24/2022: B Natriuretic Peptide 152.7 05/07/2022: BUN 21; Creatinine, Ser 1.08; Hemoglobin 14.7; NT-Pro BNP 436; Platelets 146; Potassium 4.0; Sodium 142; TSH 5.880  Recent Lipid Panel    Component Value Date/Time   CHOL 114 03/24/2022 0235   TRIG 67 03/24/2022 0235   HDL 42 03/24/2022 0235   CHOLHDL 2.7 03/24/2022 0235   VLDL 13 03/24/2022 0235   LDLCALC 59 03/24/2022 0235    Physical Exam:    VS:  BP (!) 112/58   Pulse 64   Ht '5\' 10"'$  (1.778 m)   Wt 198 lb 6.4 oz (90 kg)   SpO2 95%   BMI 28.47 kg/m     Wt Readings from Last 3 Encounters:  05/15/22 198 lb 6.4 oz (90 kg)  05/07/22 199 lb (90.3 kg)  04/28/22 201 lb 9.6 oz (91.4 kg)     GEN: Good skin color.  Appears depressed.. No acute distress HEENT: Normal NECK: No JVD. LYMPHATICS: No lymphadenopathy CARDIAC: 2/6 systolic murmur right upper sternal area no diastolic murmur. RRR no gallop, or edema. VASCULAR:  Normal Pulses. No bruits. RESPIRATORY:  Clear to auscultation without rales, wheezing or rhonchi  ABDOMEN: Soft, non-tender, non-distended, No pulsatile mass, MUSCULOSKELETAL: No deformity  SKIN: Warm and dry NEUROLOGIC:  Alert and oriented x 3 PSYCHIATRIC:  Normal affect   ASSESSMENT:    1. Chronic diastolic heart failure ([place])   2. Coronary artery disease of native artery of native heart with stable angina pectoris ([place])   3. History of transcatheter aortic valve replacement (TAVR)   4. Essential hypertension   5. Other fatigue    6. Weight loss   7. Medication management    PLAN:    In order of problems listed above:  Significant dyspnea on exertion without clinical evidence of volume overload.  It is appropriate to add an SGLT2, Jardiance 10 mg/day.  In examining the patient in the context of complaints, I believe there is a significant component of physical deconditioning. Not having angina. No new murmurs are heard Blood pressure is high in the morning but after taking his medications they are normal and many times low for age.  We will need to monitor the addition of Jardiance and may have to discontinue or decrease intensity of other therapy such as furosemide and spironolactone. Possibly from deconditioning Unknown etiology.   Medication Adjustments/Labs and Tests Ordered: Current medicines are reviewed at length with the patient today.  Concerns regarding medicines are outlined above.  Orders Placed This Encounter  Procedures   Basic metabolic panel   Meds ordered this encounter  Medications   empagliflozin (JARDIANCE) 10 MG TABS tablet    Sig: Take 1 tablet (10 mg total) by mouth daily before breakfast.    Dispense:  30 tablet    Refill:  11    Patient Instructions  Medication Instructions:  Your physician has recommended you make the following change in your medication:   1) START Jardiance '10mg'$  daily (in the morning) 2) INCREASE Spironolactone to 12.'5mg'$  daily until Sunday July 2nd, then go back to taking 12.'5mg'$  three times weekly.  *If you need a refill on your cardiac medications before your next appointment, please call your pharmacy*  Lab Work: In 10 days: BMET If you have labs (blood work) drawn today and your tests are completely normal, you will receive  your results only by: [place] (if you have MyChart) OR A paper copy in the mail If you have any lab test that is abnormal or we need to change your treatment, we will call you to review the  results.  Testing/Procedures: NONE  Follow-Up: At [facility], you and your health needs are our priority.  As part of our continuing mission to provide you with exceptional heart care, we have created designated Provider Care Teams.  These Care Teams include your primary Cardiologist (physician) and Advanced Practice Providers (APPs -  Physician Assistants and Nurse Practitioners) who all work together to provide you with the care you need, when you need it.  Your next appointment:   3 month(s)  The format for your next appointment:   In Person  Provider:   Sinclair Grooms, MD {  Other Instructions Dr. Tamala Julian would like for you to continue monitoring your blood pressure at home, call our office if it gets too low (less than 90/50). Our office number is [phone].  Dr. Tamala Julian also recommends following up with Dr. Crist Infante to discuss physical therapy to help improve deconditioning.  Important Information About Sugar         Signed, Sinclair Grooms, MD  05/15/2022 12:37 PM    [facility]

## 2022-05-14 NOTE — Telephone Encounter (Signed)
Seems like encounter was open in error so closing encounter.  

## 2022-05-14 NOTE — Progress Notes (Signed)
Spoke with pt and notified of results per [facility].  Pt verbalized understanding and denied any questions.

## 2022-05-14 NOTE — Progress Notes (Signed)
Please notify patient that his CT scan showed a stable 5 mm nodule in the LLL, which is considered benign at this point. There were no other concerning findings in the lungs. Overall, the scan looked good. He does have evidence of coronary artery disease/plaque buildup so follow up with his heart doctor as scheduled. Thanks!

## 2022-05-15 ENCOUNTER — Other Ambulatory Visit: Payer: Medicare Other

## 2022-05-15 ENCOUNTER — Encounter: Payer: Self-pay | Admitting: Interventional Cardiology

## 2022-05-15 ENCOUNTER — Ambulatory Visit (INDEPENDENT_AMBULATORY_CARE_PROVIDER_SITE_OTHER): Payer: Medicare Other | Admitting: Interventional Cardiology

## 2022-05-15 VITALS — BP 112/58 | HR 64 | Ht 70.0 in | Wt 198.4 lb

## 2022-05-15 DIAGNOSIS — R5383 Other fatigue: Secondary | ICD-10-CM | POA: Diagnosis not present

## 2022-05-15 DIAGNOSIS — Z952 Presence of prosthetic heart valve: Secondary | ICD-10-CM | POA: Diagnosis not present

## 2022-05-15 DIAGNOSIS — I25118 Atherosclerotic heart disease of native coronary artery with other forms of angina pectoris: Secondary | ICD-10-CM | POA: Diagnosis not present

## 2022-05-15 DIAGNOSIS — I5032 Chronic diastolic (congestive) heart failure: Secondary | ICD-10-CM | POA: Diagnosis not present

## 2022-05-15 DIAGNOSIS — I701 Atherosclerosis of renal artery: Secondary | ICD-10-CM | POA: Diagnosis not present

## 2022-05-15 DIAGNOSIS — R634 Abnormal weight loss: Secondary | ICD-10-CM | POA: Diagnosis not present

## 2022-05-15 DIAGNOSIS — I10 Essential (primary) hypertension: Secondary | ICD-10-CM

## 2022-05-15 DIAGNOSIS — Z79899 Other long term (current) drug therapy: Secondary | ICD-10-CM | POA: Diagnosis not present

## 2022-05-15 MED ORDER — EMPAGLIFLOZIN 10 MG PO TABS: 10.0000 mg | ORAL_TABLET | Freq: Every day | ORAL | 11 refills | Status: AC

## 2022-05-15 NOTE — Patient Instructions (Addendum)
Medication Instructions:  Your physician has recommended you make the following change in your medication:   1) START Jardiance '10mg'$  daily (in the morning) 2) INCREASE Spironolactone to 12.'5mg'$  daily until Sunday July 2nd, then go back to taking 12.'5mg'$  three times weekly.  *If you need a refill on your cardiac medications before your next appointment, please call your pharmacy*  Lab Work: In 10 days: BMET If you have labs (blood work) drawn today and your tests are completely normal, you will receive your results only by: [place] (if you have MyChart) OR A paper copy in the mail If you have any lab test that is abnormal or we need to change your treatment, we will call you to review the results.  Testing/Procedures: NONE  Follow-Up: At [facility], you and your health needs are our priority.  As part of our continuing mission to provide you with exceptional heart care, we have created designated Provider Care Teams.  These Care Teams include your primary Cardiologist (physician) and Advanced Practice Providers (APPs -  Physician Assistants and Nurse Practitioners) who all work together to provide you with the care you need, when you need it.  Your next appointment:   3 month(s)  The format for your next appointment:   In Person  Provider:   Sinclair Grooms, MD {  Other Instructions Dr. Tamala Julian would like for you to continue monitoring your blood pressure at home, call our office if it gets too low (less than 90/50). Our office number is [phone].  Dr. Tamala Julian also recommends following up with Dr. Crist Infante to discuss physical therapy to help improve deconditioning.  Important Information About Sugar

## 2022-05-16 ENCOUNTER — Encounter: Payer: Self-pay | Admitting: Interventional Cardiology

## 2022-05-16 DIAGNOSIS — I509 Heart failure, unspecified: Secondary | ICD-10-CM | POA: Diagnosis not present

## 2022-05-16 DIAGNOSIS — E785 Hyperlipidemia, unspecified: Secondary | ICD-10-CM | POA: Diagnosis not present

## 2022-05-16 DIAGNOSIS — I110 Hypertensive heart disease with heart failure: Secondary | ICD-10-CM | POA: Diagnosis not present

## 2022-05-21 DIAGNOSIS — Z125 Encounter for screening for malignant neoplasm of prostate: Secondary | ICD-10-CM | POA: Diagnosis not present

## 2022-05-23 ENCOUNTER — Encounter: Payer: Self-pay | Admitting: Interventional Cardiology

## 2022-05-23 ENCOUNTER — Telehealth: Payer: Self-pay | Admitting: Pulmonary Disease

## 2022-05-23 DIAGNOSIS — I5032 Chronic diastolic (congestive) heart failure: Secondary | ICD-10-CM | POA: Diagnosis not present

## 2022-05-23 DIAGNOSIS — I214 Non-ST elevation (NSTEMI) myocardial infarction: Secondary | ICD-10-CM | POA: Diagnosis not present

## 2022-05-23 DIAGNOSIS — Z952 Presence of prosthetic heart valve: Secondary | ICD-10-CM | POA: Diagnosis not present

## 2022-05-23 DIAGNOSIS — K2950 Unspecified chronic gastritis without bleeding: Secondary | ICD-10-CM | POA: Diagnosis not present

## 2022-05-23 DIAGNOSIS — F419 Anxiety disorder, unspecified: Secondary | ICD-10-CM | POA: Diagnosis not present

## 2022-05-23 DIAGNOSIS — M6281 Muscle weakness (generalized): Secondary | ICD-10-CM | POA: Diagnosis not present

## 2022-05-23 DIAGNOSIS — R5383 Other fatigue: Secondary | ICD-10-CM | POA: Diagnosis not present

## 2022-05-23 DIAGNOSIS — R7301 Impaired fasting glucose: Secondary | ICD-10-CM | POA: Diagnosis not present

## 2022-05-23 DIAGNOSIS — E785 Hyperlipidemia, unspecified: Secondary | ICD-10-CM | POA: Diagnosis not present

## 2022-05-23 DIAGNOSIS — I509 Heart failure, unspecified: Secondary | ICD-10-CM | POA: Diagnosis not present

## 2022-05-23 DIAGNOSIS — I110 Hypertensive heart disease with heart failure: Secondary | ICD-10-CM | POA: Diagnosis not present

## 2022-05-23 DIAGNOSIS — G4730 Sleep apnea, unspecified: Secondary | ICD-10-CM | POA: Diagnosis not present

## 2022-05-23 DIAGNOSIS — I2510 Atherosclerotic heart disease of native coronary artery without angina pectoris: Secondary | ICD-10-CM | POA: Diagnosis not present

## 2022-05-23 NOTE — Telephone Encounter (Signed)
Called by Andre Miranda, who doesn't know if his CPAP machine is set correctly. From past notes the setting should be auto 5-15. He says that the machine displays a pressure of 7 which is within the auto titration range. So it would appear that the machine is set up correctly. Recommended that he call the [facility] office Monday morning for any further concerns and to have the office check his recorded interaction with the machine.

## 2022-05-24 DIAGNOSIS — Z515 Encounter for palliative care: Secondary | ICD-10-CM | POA: Diagnosis not present

## 2022-05-24 DIAGNOSIS — I214 Non-ST elevation (NSTEMI) myocardial infarction: Secondary | ICD-10-CM | POA: Diagnosis not present

## 2022-05-24 DIAGNOSIS — I25718 Atherosclerosis of autologous vein coronary artery bypass graft(s) with other forms of angina pectoris: Secondary | ICD-10-CM | POA: Diagnosis not present

## 2022-05-24 DIAGNOSIS — Z954 Presence of other heart-valve replacement: Secondary | ICD-10-CM | POA: Diagnosis not present

## 2022-05-24 DIAGNOSIS — J989 Respiratory disorder, unspecified: Secondary | ICD-10-CM | POA: Diagnosis not present

## 2022-05-24 DIAGNOSIS — Z7902 Long term (current) use of antithrombotics/antiplatelets: Secondary | ICD-10-CM | POA: Diagnosis not present

## 2022-05-24 DIAGNOSIS — I252 Old myocardial infarction: Secondary | ICD-10-CM | POA: Diagnosis not present

## 2022-05-24 DIAGNOSIS — I5043 Acute on chronic combined systolic (congestive) and diastolic (congestive) heart failure: Secondary | ICD-10-CM | POA: Diagnosis not present

## 2022-05-24 DIAGNOSIS — I10 Essential (primary) hypertension: Secondary | ICD-10-CM | POA: Diagnosis not present

## 2022-05-24 DIAGNOSIS — I25810 Atherosclerosis of coronary artery bypass graft(s) without angina pectoris: Secondary | ICD-10-CM | POA: Diagnosis not present

## 2022-05-24 DIAGNOSIS — I469 Cardiac arrest, cause unspecified: Secondary | ICD-10-CM | POA: Diagnosis not present

## 2022-05-24 DIAGNOSIS — I110 Hypertensive heart disease with heart failure: Secondary | ICD-10-CM | POA: Diagnosis not present

## 2022-05-24 DIAGNOSIS — I959 Hypotension, unspecified: Secondary | ICD-10-CM | POA: Diagnosis not present

## 2022-05-24 DIAGNOSIS — Z66 Do not resuscitate: Secondary | ICD-10-CM | POA: Diagnosis not present

## 2022-05-24 DIAGNOSIS — I25118 Atherosclerotic heart disease of native coronary artery with other forms of angina pectoris: Secondary | ICD-10-CM | POA: Diagnosis not present

## 2022-05-24 DIAGNOSIS — J810 Acute pulmonary edema: Secondary | ICD-10-CM | POA: Diagnosis not present

## 2022-05-24 DIAGNOSIS — R7303 Prediabetes: Secondary | ICD-10-CM | POA: Diagnosis not present

## 2022-05-24 DIAGNOSIS — I509 Heart failure, unspecified: Secondary | ICD-10-CM | POA: Diagnosis not present

## 2022-05-24 DIAGNOSIS — I5031 Acute diastolic (congestive) heart failure: Secondary | ICD-10-CM | POA: Diagnosis not present

## 2022-05-24 DIAGNOSIS — R0789 Other chest pain: Secondary | ICD-10-CM | POA: Diagnosis not present

## 2022-05-24 DIAGNOSIS — E440 Moderate protein-calorie malnutrition: Secondary | ICD-10-CM | POA: Diagnosis present

## 2022-05-24 DIAGNOSIS — I2510 Atherosclerotic heart disease of native coronary artery without angina pectoris: Secondary | ICD-10-CM | POA: Diagnosis not present

## 2022-05-24 DIAGNOSIS — I2583 Coronary atherosclerosis due to lipid rich plaque: Secondary | ICD-10-CM | POA: Diagnosis not present

## 2022-05-24 DIAGNOSIS — J9690 Respiratory failure, unspecified, unspecified whether with hypoxia or hypercapnia: Secondary | ICD-10-CM | POA: Diagnosis not present

## 2022-05-24 DIAGNOSIS — Z8679 Personal history of other diseases of the circulatory system: Secondary | ICD-10-CM | POA: Diagnosis not present

## 2022-05-24 DIAGNOSIS — T82855A Stenosis of coronary artery stent, initial encounter: Secondary | ICD-10-CM | POA: Diagnosis present

## 2022-05-24 DIAGNOSIS — I4430 Unspecified atrioventricular block: Secondary | ICD-10-CM | POA: Diagnosis not present

## 2022-05-24 DIAGNOSIS — M109 Gout, unspecified: Secondary | ICD-10-CM | POA: Diagnosis not present

## 2022-05-24 DIAGNOSIS — Z4659 Encounter for fitting and adjustment of other gastrointestinal appliance and device: Secondary | ICD-10-CM | POA: Diagnosis not present

## 2022-05-24 DIAGNOSIS — Z87891 Personal history of nicotine dependence: Secondary | ICD-10-CM | POA: Diagnosis not present

## 2022-05-24 DIAGNOSIS — Z4682 Encounter for fitting and adjustment of non-vascular catheter: Secondary | ICD-10-CM | POA: Diagnosis not present

## 2022-05-24 DIAGNOSIS — R109 Unspecified abdominal pain: Secondary | ICD-10-CM | POA: Diagnosis not present

## 2022-05-24 DIAGNOSIS — Z923 Personal history of irradiation: Secondary | ICD-10-CM | POA: Diagnosis not present

## 2022-05-24 DIAGNOSIS — R0602 Shortness of breath: Secondary | ICD-10-CM | POA: Diagnosis not present

## 2022-05-24 DIAGNOSIS — G4733 Obstructive sleep apnea (adult) (pediatric): Secondary | ICD-10-CM | POA: Diagnosis not present

## 2022-05-24 DIAGNOSIS — R092 Respiratory arrest: Secondary | ICD-10-CM | POA: Diagnosis not present

## 2022-05-24 DIAGNOSIS — Z9911 Dependence on respirator [ventilator] status: Secondary | ICD-10-CM | POA: Diagnosis not present

## 2022-05-24 DIAGNOSIS — Z955 Presence of coronary angioplasty implant and graft: Secondary | ICD-10-CM | POA: Diagnosis not present

## 2022-05-24 DIAGNOSIS — E785 Hyperlipidemia, unspecified: Secondary | ICD-10-CM | POA: Diagnosis not present

## 2022-05-24 DIAGNOSIS — Z8673 Personal history of transient ischemic attack (TIA), and cerebral infarction without residual deficits: Secondary | ICD-10-CM | POA: Diagnosis not present

## 2022-05-24 DIAGNOSIS — Z953 Presence of xenogenic heart valve: Secondary | ICD-10-CM | POA: Diagnosis not present

## 2022-05-24 DIAGNOSIS — Z950 Presence of cardiac pacemaker: Secondary | ICD-10-CM | POA: Diagnosis not present

## 2022-05-24 DIAGNOSIS — Z9861 Coronary angioplasty status: Secondary | ICD-10-CM | POA: Diagnosis not present

## 2022-05-24 DIAGNOSIS — I2582 Chronic total occlusion of coronary artery: Secondary | ICD-10-CM | POA: Diagnosis not present

## 2022-05-24 DIAGNOSIS — Z8546 Personal history of malignant neoplasm of prostate: Secondary | ICD-10-CM | POA: Diagnosis not present

## 2022-05-24 DIAGNOSIS — I468 Cardiac arrest due to other underlying condition: Secondary | ICD-10-CM | POA: Diagnosis not present

## 2022-05-24 DIAGNOSIS — R918 Other nonspecific abnormal finding of lung field: Secondary | ICD-10-CM | POA: Diagnosis not present

## 2022-05-24 DIAGNOSIS — Z951 Presence of aortocoronary bypass graft: Secondary | ICD-10-CM | POA: Diagnosis not present

## 2022-05-24 DIAGNOSIS — Z7982 Long term (current) use of aspirin: Secondary | ICD-10-CM | POA: Diagnosis not present

## 2022-05-24 DIAGNOSIS — R140 Abdominal distension (gaseous): Secondary | ICD-10-CM | POA: Diagnosis not present

## 2022-05-24 DIAGNOSIS — I350 Nonrheumatic aortic (valve) stenosis: Secondary | ICD-10-CM | POA: Diagnosis not present

## 2022-05-24 DIAGNOSIS — J9602 Acute respiratory failure with hypercapnia: Secondary | ICD-10-CM | POA: Diagnosis not present

## 2022-05-24 DIAGNOSIS — J9811 Atelectasis: Secondary | ICD-10-CM | POA: Diagnosis not present

## 2022-05-24 DIAGNOSIS — I21A1 Myocardial infarction type 2: Secondary | ICD-10-CM | POA: Diagnosis not present

## 2022-05-24 DIAGNOSIS — D72829 Elevated white blood cell count, unspecified: Secondary | ICD-10-CM | POA: Diagnosis not present

## 2022-05-24 DIAGNOSIS — Z8674 Personal history of sudden cardiac arrest: Secondary | ICD-10-CM | POA: Diagnosis not present

## 2022-05-24 DIAGNOSIS — J9601 Acute respiratory failure with hypoxia: Secondary | ICD-10-CM | POA: Diagnosis not present

## 2022-05-24 DIAGNOSIS — Z952 Presence of prosthetic heart valve: Secondary | ICD-10-CM | POA: Diagnosis not present

## 2022-05-24 DIAGNOSIS — K219 Gastro-esophageal reflux disease without esophagitis: Secondary | ICD-10-CM | POA: Diagnosis not present

## 2022-05-25 DIAGNOSIS — I10 Essential (primary) hypertension: Secondary | ICD-10-CM | POA: Diagnosis not present

## 2022-05-25 DIAGNOSIS — Z951 Presence of aortocoronary bypass graft: Secondary | ICD-10-CM | POA: Diagnosis not present

## 2022-05-25 DIAGNOSIS — R0602 Shortness of breath: Secondary | ICD-10-CM | POA: Diagnosis not present

## 2022-05-25 DIAGNOSIS — I2510 Atherosclerotic heart disease of native coronary artery without angina pectoris: Secondary | ICD-10-CM | POA: Diagnosis not present

## 2022-05-25 DIAGNOSIS — E785 Hyperlipidemia, unspecified: Secondary | ICD-10-CM | POA: Diagnosis not present

## 2022-05-25 NOTE — Telephone Encounter (Signed)
He is probably looking at the humidifier setting. If he calls, please ask him to check with his DME company so they can look at it.

## 2022-05-26 ENCOUNTER — Telehealth: Payer: Self-pay | Admitting: Interventional Cardiology

## 2022-05-26 NOTE — Telephone Encounter (Signed)
Called patient but he did not answer. Left message for him to call back. I checked [employer] and [employer] and did not see any data for patient.   Called [employer] to request a copy of his download but I had to leave a VM. Will check later for fax.

## 2022-05-26 NOTE — Telephone Encounter (Signed)
Spouse wanted to make Dr. Tamala Julian aware that pt is currently I the hospital at [facility]. Please advise

## 2022-05-27 DIAGNOSIS — R0602 Shortness of breath: Secondary | ICD-10-CM | POA: Diagnosis not present

## 2022-05-27 NOTE — Telephone Encounter (Signed)
Spoke with patient's spouse, Jeanett Schlein, to let her know we received her message regarding patient being admitted to [facility]. Also to let her know that Dr. Tamala Julian is following along in [facility], awaiting the results of his repeat cath.  Jeanette expressed appreciation for following-up and for Dr. Tamala Julian keeping up with patient and his care.

## 2022-05-27 NOTE — Telephone Encounter (Signed)
Let her know I have been following along in [place]. Awaiting for the results of the repeat cath.

## 2022-05-28 ENCOUNTER — Ambulatory Visit: Payer: Medicare Other | Admitting: Nurse Practitioner

## 2022-05-28 DIAGNOSIS — R0602 Shortness of breath: Secondary | ICD-10-CM | POA: Diagnosis not present

## 2022-05-29 ENCOUNTER — Other Ambulatory Visit: Payer: Medicare Other

## 2022-05-29 DIAGNOSIS — R0602 Shortness of breath: Secondary | ICD-10-CM | POA: Diagnosis not present

## 2022-05-30 DIAGNOSIS — R0602 Shortness of breath: Secondary | ICD-10-CM | POA: Diagnosis not present

## 2022-05-30 DIAGNOSIS — I469 Cardiac arrest, cause unspecified: Secondary | ICD-10-CM | POA: Diagnosis not present

## 2022-05-31 DIAGNOSIS — R0602 Shortness of breath: Secondary | ICD-10-CM | POA: Diagnosis not present

## 2022-06-05 IMAGING — DX DG CHEST 2V
2 series · 2 of 2 positions shown · non-contrast
Comparison: 06/24/2021

CLINICAL DATA: Chest pain

EXAM:
CHEST - 2 VIEW

[chest pa]
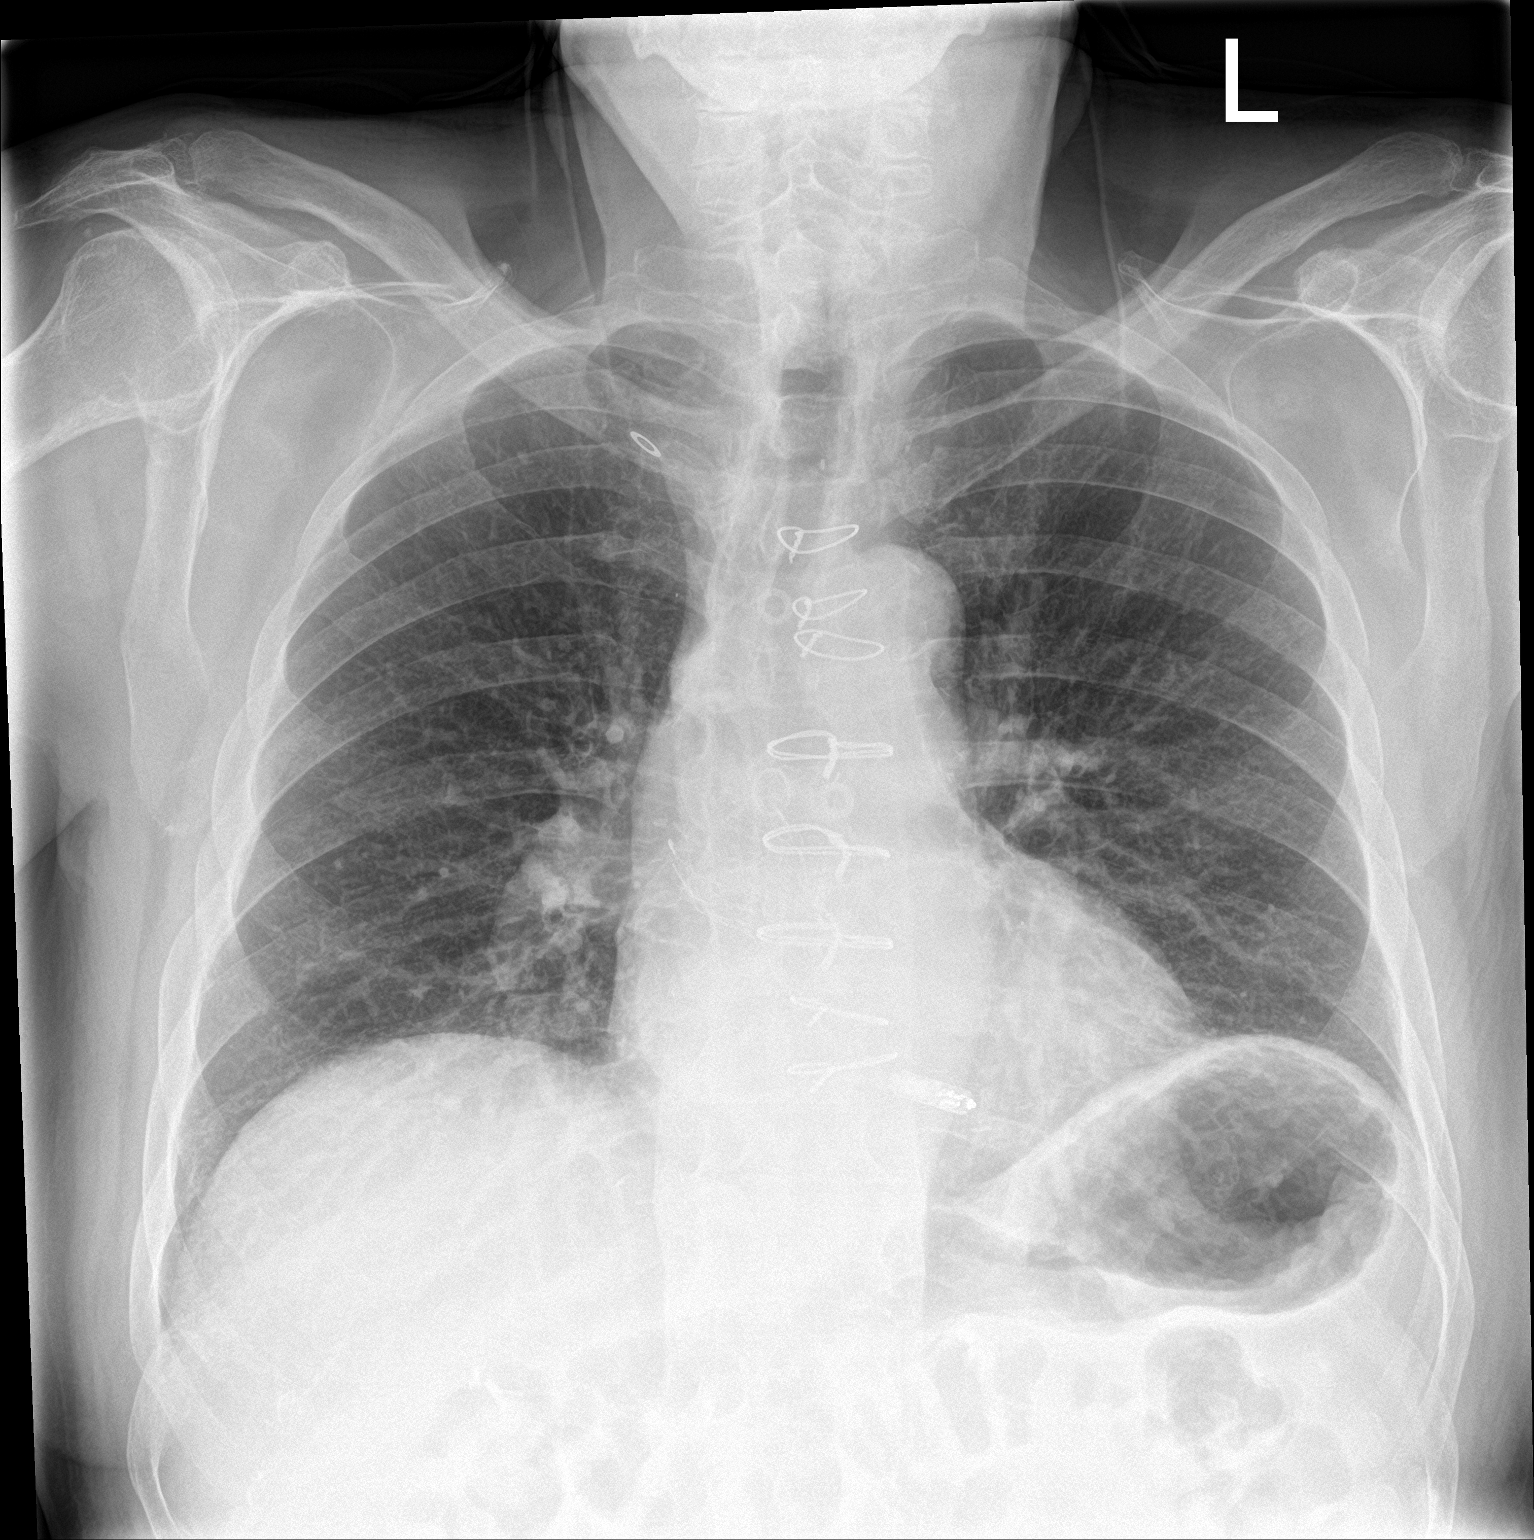

[chest lat]
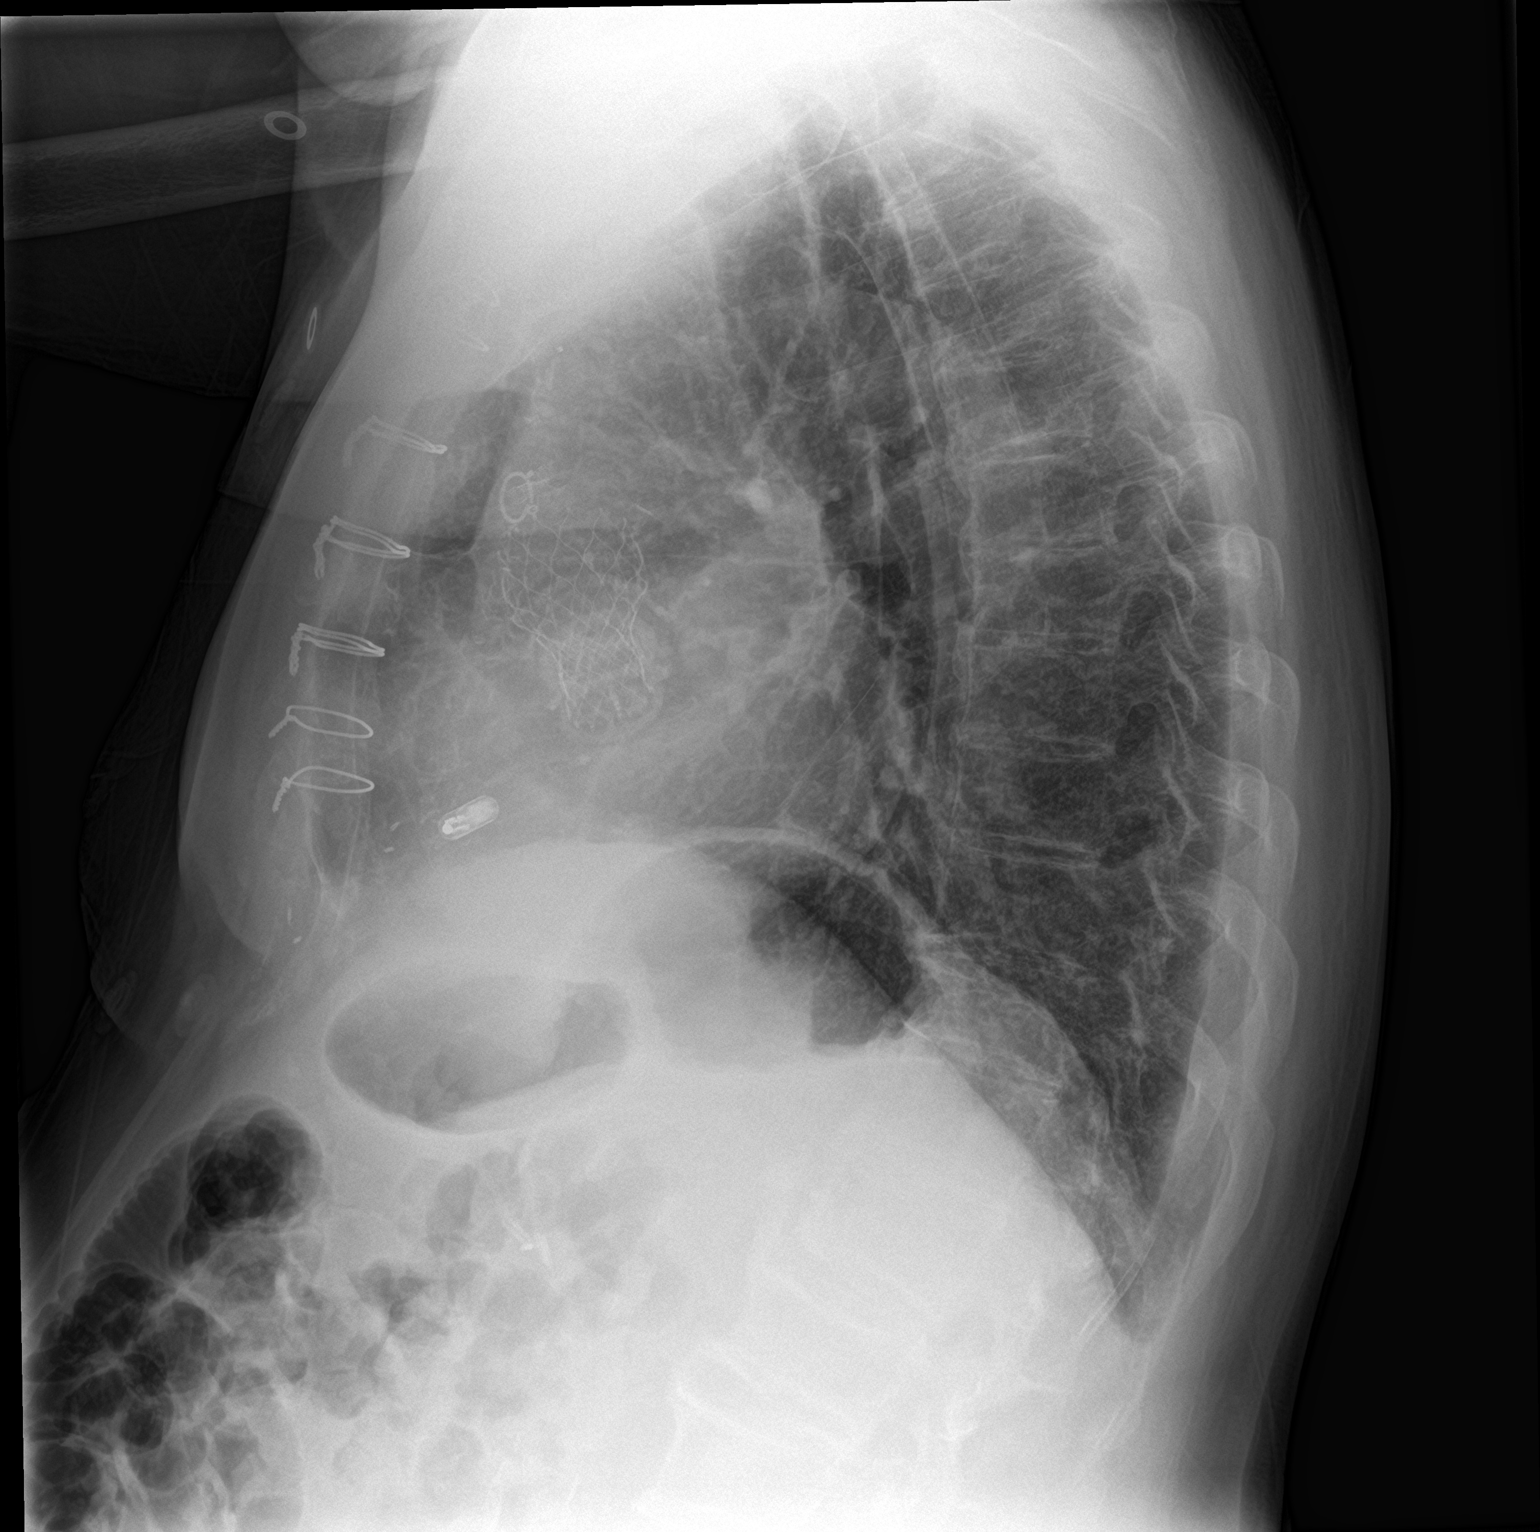

[2 of 2 positions shown; findings below may reference images not displayed]

FINDINGS: Unchanged cardiac implant. Prior sternotomy, CABG, and TAVR. The
heart size and mediastinal contours are within normal limits. Aortic
atherosclerosis. No focal airspace consolidation, pleural effusion,
or pneumothorax. The visualized skeletal structures are
unremarkable.
IMPRESSION: No active cardiopulmonary disease.

## 2022-06-17 DEATH — deceased

## 2022-06-18 ENCOUNTER — Ambulatory Visit: Payer: Medicare Other | Admitting: Physician Assistant

## 2022-07-11 IMAGING — DX DG CHEST 2V
2 series · 2 of 2 positions shown · non-contrast
Comparison: 03/23/2022

CLINICAL DATA: Shortness of breath.  Coronary artery disease.

EXAM:
CHEST - 2 VIEW

[chest pa]
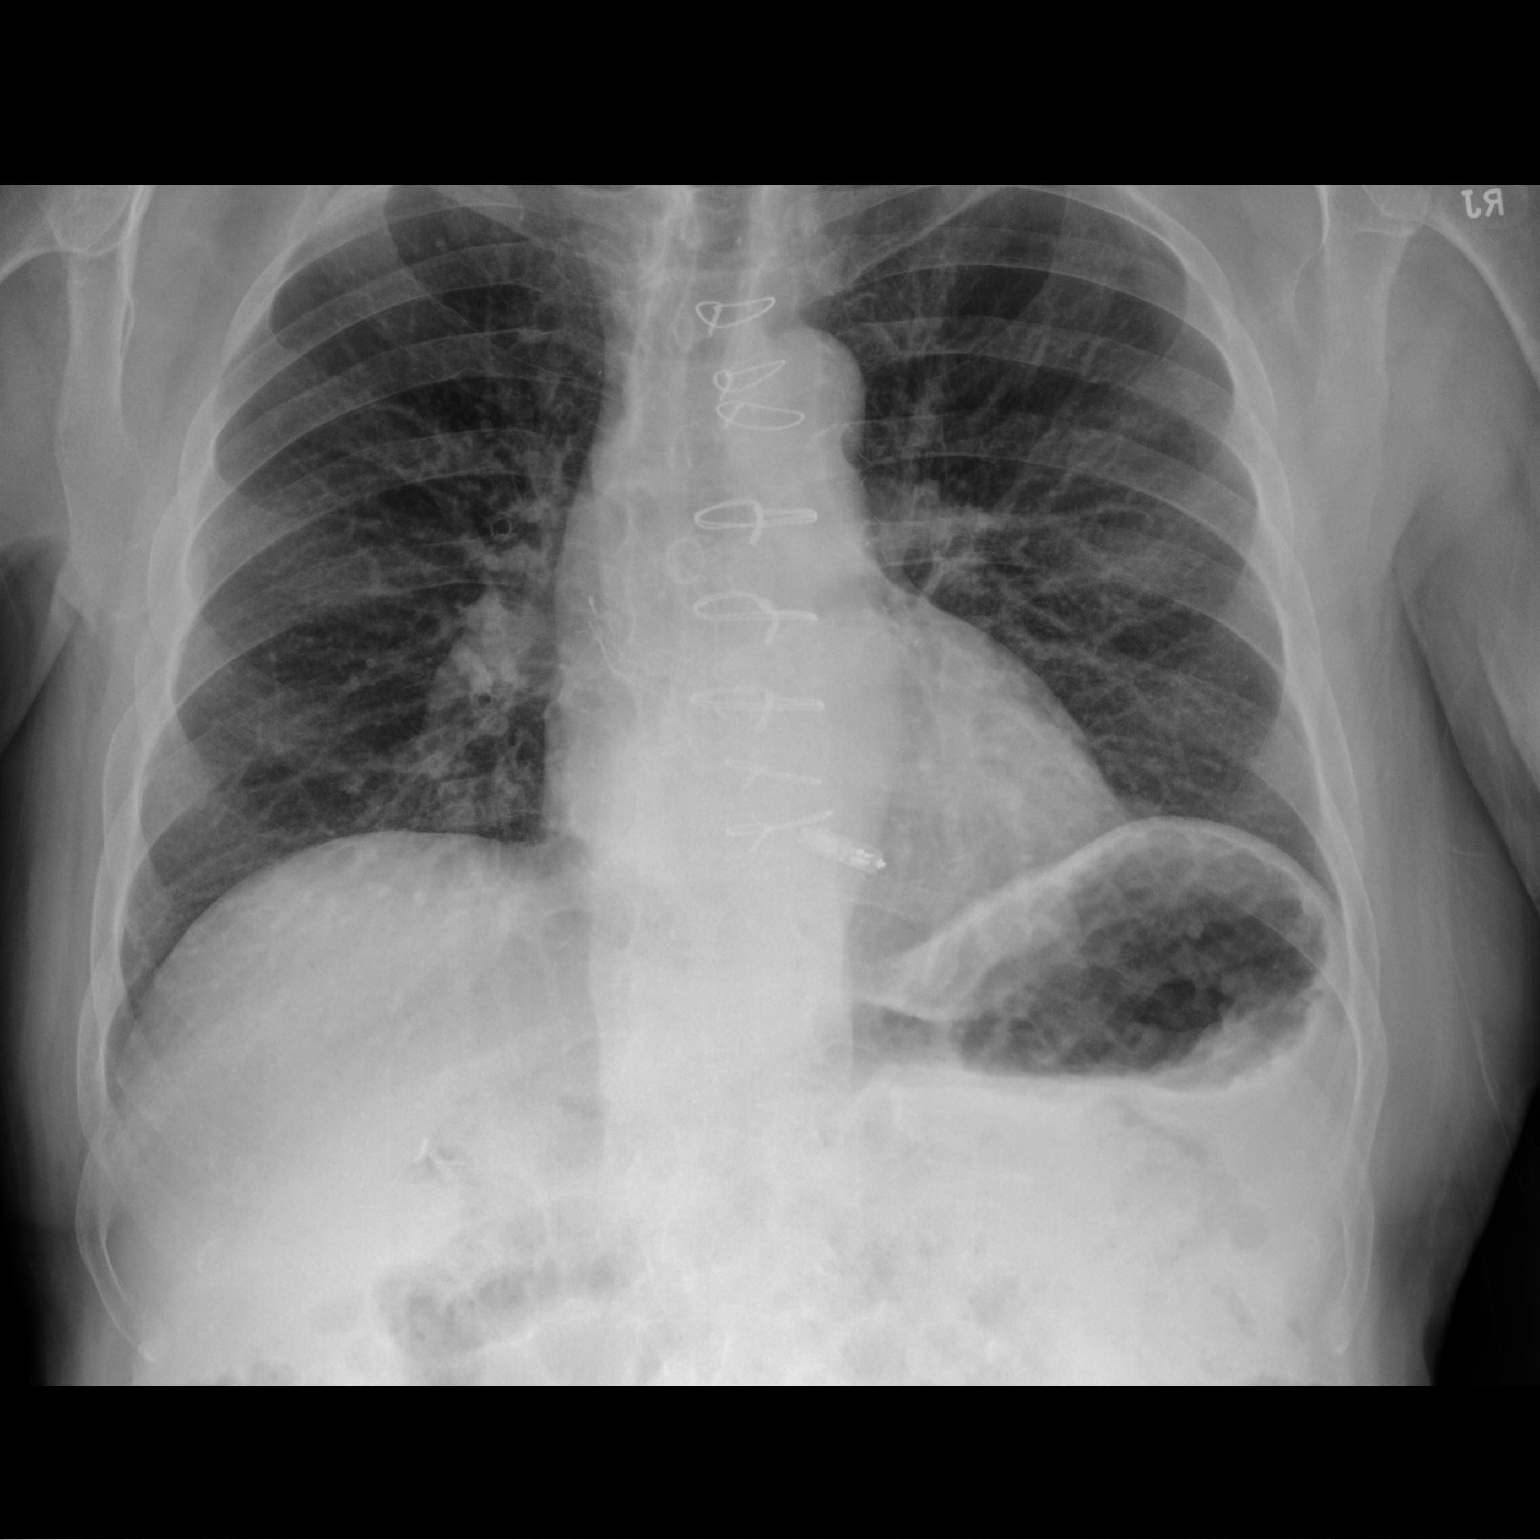

[chest lat]
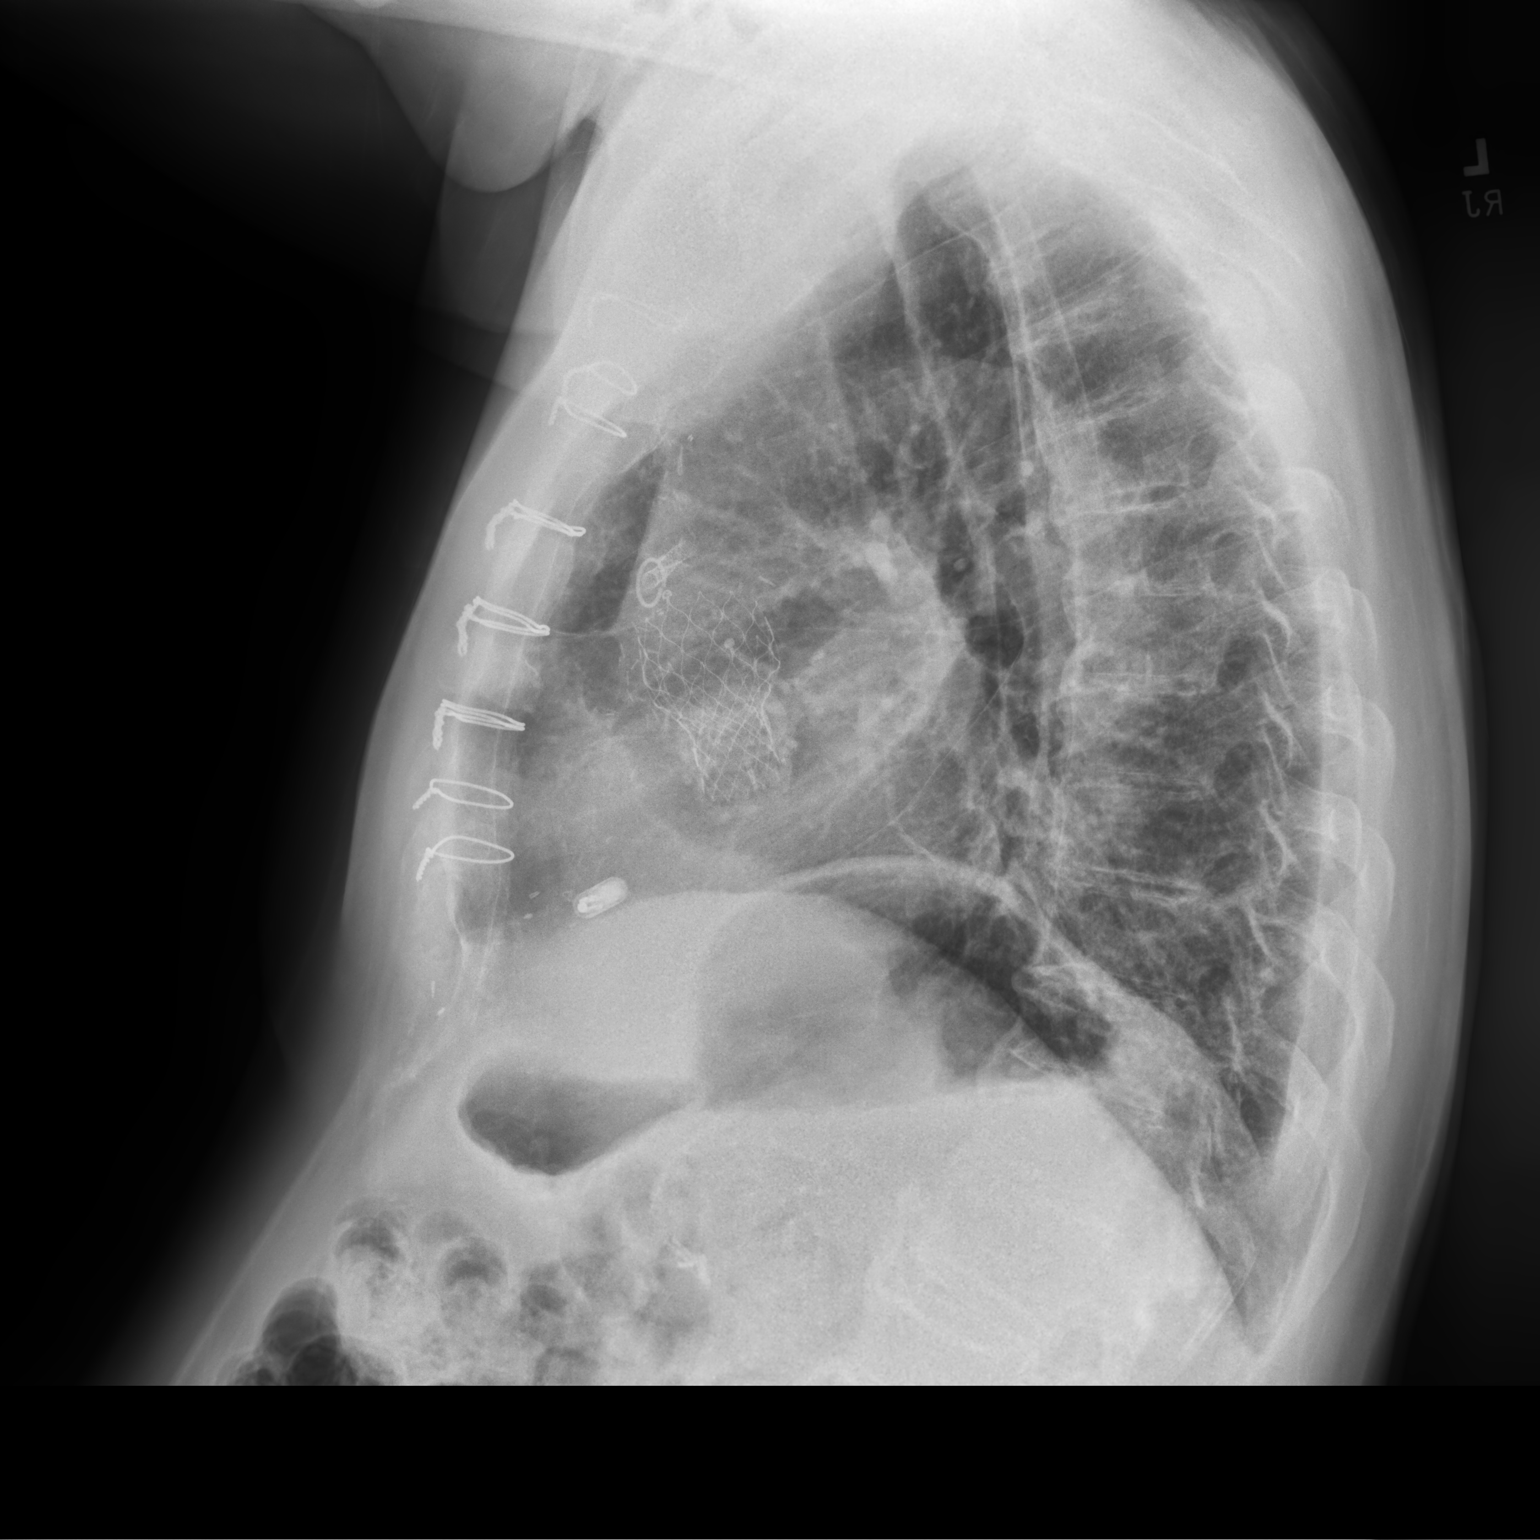

[2 of 2 positions shown; findings below may reference images not displayed]

FINDINGS: The heart size and mediastinal contours are within normal limits.
Prior CABG and TAVR again noted. Cardiac loop recorder again seen.
Both lungs are clear. The visualized skeletal structures are
unremarkable.
IMPRESSION: Stable exam. No active cardiopulmonary disease.

## 2022-07-14 ENCOUNTER — Ambulatory Visit: Payer: Medicare Other | Admitting: Interventional Cardiology

## 2022-08-18 ENCOUNTER — Ambulatory Visit: Payer: Medicare Other | Admitting: Interventional Cardiology

## 2022-09-15 ENCOUNTER — Encounter (HOSPITAL_COMMUNITY): Payer: BLUE CROSS/BLUE SHIELD

## 2022-10-24 ENCOUNTER — Telehealth: Payer: Self-pay | Admitting: Internal Medicine

## 2022-10-24 NOTE — Telephone Encounter (Signed)
Called wife back and left a message that she needs to contact [employer] the DME company in regards to the cpap machine. I told her I did not want her to be charged for it if it is not being used. Nothing further  Gave the number for [employer] to call and told her to call the office back with any concerns.

## 2022-10-30 ENCOUNTER — Ambulatory Visit: Payer: Medicare Other | Admitting: Internal Medicine
